# Patient Record
Sex: Female | Born: 1935 | Race: White | Hispanic: No | Smoking: Never smoker
Health system: Southern US, Community
[De-identification: ages and names within clinical notes are randomized; demographics above are authoritative.]

## PROBLEM LIST (undated history)

## (undated) DIAGNOSIS — E538 Deficiency of other specified B group vitamins: Secondary | ICD-10-CM

## (undated) DIAGNOSIS — M545 Low back pain, unspecified: Secondary | ICD-10-CM

## (undated) DIAGNOSIS — N814 Uterovaginal prolapse, unspecified: Secondary | ICD-10-CM

## (undated) DIAGNOSIS — R269 Unspecified abnormalities of gait and mobility: Secondary | ICD-10-CM

## (undated) DIAGNOSIS — E785 Hyperlipidemia, unspecified: Secondary | ICD-10-CM

## (undated) DIAGNOSIS — M1990 Unspecified osteoarthritis, unspecified site: Secondary | ICD-10-CM

## (undated) DIAGNOSIS — I10 Essential (primary) hypertension: Secondary | ICD-10-CM

## (undated) DIAGNOSIS — K8020 Calculus of gallbladder without cholecystitis without obstruction: Secondary | ICD-10-CM

## (undated) DIAGNOSIS — I4891 Unspecified atrial fibrillation: Secondary | ICD-10-CM

## (undated) DIAGNOSIS — L719 Rosacea, unspecified: Secondary | ICD-10-CM

## (undated) DIAGNOSIS — F419 Anxiety disorder, unspecified: Secondary | ICD-10-CM

## (undated) DIAGNOSIS — H409 Unspecified glaucoma: Secondary | ICD-10-CM

## (undated) DIAGNOSIS — E669 Obesity, unspecified: Secondary | ICD-10-CM

## (undated) DIAGNOSIS — S43006A Unspecified dislocation of unspecified shoulder joint, initial encounter: Secondary | ICD-10-CM

## (undated) DIAGNOSIS — R609 Edema, unspecified: Secondary | ICD-10-CM

## (undated) DIAGNOSIS — R0602 Shortness of breath: Secondary | ICD-10-CM

## (undated) DIAGNOSIS — I872 Venous insufficiency (chronic) (peripheral): Secondary | ICD-10-CM

## (undated) DIAGNOSIS — M6740 Ganglion, unspecified site: Secondary | ICD-10-CM

## (undated) DIAGNOSIS — R42 Dizziness and giddiness: Secondary | ICD-10-CM

## (undated) DIAGNOSIS — K760 Fatty (change of) liver, not elsewhere classified: Secondary | ICD-10-CM

## (undated) HISTORY — DX: Low back pain, unspecified: M54.50

## (undated) HISTORY — PX: ABDOMINAL HYSTERECTOMY: SHX81

## (undated) HISTORY — DX: Unspecified osteoarthritis, unspecified site: M19.90

## (undated) HISTORY — DX: Calculus of gallbladder without cholecystitis without obstruction: K80.20

## (undated) HISTORY — DX: Hyperlipidemia, unspecified: E78.5

## (undated) HISTORY — DX: Essential (primary) hypertension: I10

## (undated) HISTORY — PX: TONSILLECTOMY: SUR1361

## (undated) HISTORY — DX: Rosacea, unspecified: L71.9

## (undated) HISTORY — DX: Low back pain: M54.5

## (undated) HISTORY — DX: Fatty (change of) liver, not elsewhere classified: K76.0

---

## 2000-04-10 ENCOUNTER — Encounter: Admission: RE | Admit: 2000-04-10 | Discharge: 2000-04-10 | Payer: Self-pay | Admitting: Internal Medicine

## 2000-04-10 ENCOUNTER — Encounter: Payer: Self-pay | Admitting: Internal Medicine

## 2000-04-14 ENCOUNTER — Ambulatory Visit (HOSPITAL_COMMUNITY): Admission: RE | Admit: 2000-04-14 | Discharge: 2000-04-14 | Payer: Self-pay | Admitting: Internal Medicine

## 2000-04-14 ENCOUNTER — Encounter: Payer: Self-pay | Admitting: Internal Medicine

## 2001-12-03 ENCOUNTER — Encounter: Payer: Self-pay | Admitting: Cardiology

## 2005-02-20 ENCOUNTER — Ambulatory Visit: Payer: Self-pay | Admitting: Internal Medicine

## 2005-03-30 ENCOUNTER — Ambulatory Visit: Payer: Self-pay | Admitting: Internal Medicine

## 2005-04-03 ENCOUNTER — Ambulatory Visit: Payer: Self-pay | Admitting: Internal Medicine

## 2005-10-02 ENCOUNTER — Ambulatory Visit: Payer: Self-pay | Admitting: Internal Medicine

## 2006-01-23 ENCOUNTER — Ambulatory Visit: Payer: Self-pay | Admitting: Internal Medicine

## 2006-01-26 ENCOUNTER — Ambulatory Visit: Payer: Self-pay | Admitting: Internal Medicine

## 2006-05-24 ENCOUNTER — Ambulatory Visit: Payer: Self-pay | Admitting: Internal Medicine

## 2006-09-03 ENCOUNTER — Ambulatory Visit: Payer: Self-pay | Admitting: Internal Medicine

## 2006-09-03 LAB — CONVERTED CEMR LAB
Bilirubin Urine: NEGATIVE
Cholesterol: 220 mg/dL (ref 0–200)
Crystals: NEGATIVE
Glucose, Bld: 108 mg/dL — ABNORMAL HIGH (ref 70–99)
HDL: 41 mg/dL (ref >39.0)
Hemoglobin, Urine: NEGATIVE
Mucus, UA: NEGATIVE
Nitrite: NEGATIVE
Sodium: 140 meq/L (ref 135–145)
Specific Gravity, Urine: 1.02 (ref 1.000–1.03)
TSH: 3.51 microintl units/mL (ref 0.35–5.50)

## 2006-09-06 ENCOUNTER — Ambulatory Visit: Payer: Self-pay | Admitting: Internal Medicine

## 2007-02-25 ENCOUNTER — Ambulatory Visit: Payer: Self-pay | Admitting: Internal Medicine

## 2007-02-25 LAB — CONVERTED CEMR LAB
BUN: 12 mg/dL (ref 6–23)
CO2: 30 meq/L (ref 19–32)
Calcium: 9.1 mg/dL (ref 8.4–10.5)
Direct LDL: 145.4 mg/dL
GFR calc Af Amer: 127 mL/min
GFR calc non Af Amer: 105 mL/min
Hgb A1c MFr Bld: 5.4 % (ref 4.6–6.0)
Triglycerides: 159 mg/dL — ABNORMAL HIGH (ref 0–149)
Vit D, 1,25-Dihydroxy: 17 — ABNORMAL LOW (ref 20–57)

## 2007-02-27 ENCOUNTER — Ambulatory Visit: Payer: Self-pay | Admitting: Internal Medicine

## 2007-02-27 ENCOUNTER — Encounter: Payer: Self-pay | Admitting: Internal Medicine

## 2007-02-27 DIAGNOSIS — M545 Low back pain, unspecified: Secondary | ICD-10-CM | POA: Insufficient documentation

## 2007-02-27 DIAGNOSIS — I10 Essential (primary) hypertension: Secondary | ICD-10-CM | POA: Insufficient documentation

## 2007-02-27 DIAGNOSIS — E559 Vitamin D deficiency, unspecified: Secondary | ICD-10-CM | POA: Insufficient documentation

## 2007-08-22 ENCOUNTER — Ambulatory Visit: Payer: Self-pay | Admitting: Internal Medicine

## 2007-08-22 DIAGNOSIS — R209 Unspecified disturbances of skin sensation: Secondary | ICD-10-CM | POA: Insufficient documentation

## 2007-08-25 LAB — CONVERTED CEMR LAB
Basophils Absolute: 0 10*3/uL (ref 0.0–0.1)
Eosinophils Absolute: 0.2 10*3/uL (ref 0.0–0.6)
HCT: 42.7 % (ref 36.0–46.0)
Hemoglobin: 14.5 g/dL (ref 12.0–15.0)
Lymphocytes Relative: 33.8 % (ref 12.0–46.0)
MCHC: 33.9 g/dL (ref 30.0–36.0)
MCV: 97.5 fL (ref 78.0–100.0)
Monocytes Absolute: 0.5 10*3/uL (ref 0.2–0.7)
Neutro Abs: 2.4 10*3/uL (ref 1.4–7.7)
Neutrophils Relative %: 51.6 % (ref 43.0–77.0)
Triglycerides: 208 mg/dL (ref 0–149)

## 2007-08-29 ENCOUNTER — Ambulatory Visit: Payer: Self-pay | Admitting: Internal Medicine

## 2007-08-29 DIAGNOSIS — E785 Hyperlipidemia, unspecified: Secondary | ICD-10-CM | POA: Insufficient documentation

## 2007-10-01 ENCOUNTER — Encounter: Payer: Self-pay | Admitting: Internal Medicine

## 2008-02-21 ENCOUNTER — Ambulatory Visit: Payer: Self-pay | Admitting: Internal Medicine

## 2008-02-24 LAB — CONVERTED CEMR LAB
ALT: 58 units/L — ABNORMAL HIGH (ref 0–35)
Albumin: 4 g/dL (ref 3.5–5.2)
Bilirubin, Direct: 0.2 mg/dL (ref 0.0–0.3)
Calcium: 9.4 mg/dL (ref 8.4–10.5)
Creatinine, Ser: 0.7 mg/dL (ref 0.4–1.2)
Direct LDL: 139.3 mg/dL
GFR calc Af Amer: 106 mL/min
Glucose, Bld: 109 mg/dL — ABNORMAL HIGH (ref 70–99)
Hemoglobin, Urine: NEGATIVE
Ketones, ur: NEGATIVE mg/dL
RBC / HPF: NONE SEEN
Sodium: 143 meq/L (ref 135–145)
Specific Gravity, Urine: 1.01 (ref 1.000–1.03)
Total CHOL/HDL Ratio: 6.2
Total Protein: 6.9 g/dL (ref 6.0–8.3)
Urobilinogen, UA: 0.2 (ref 0.0–1.0)
VLDL: 35 mg/dL (ref 0–40)

## 2008-02-27 ENCOUNTER — Ambulatory Visit: Payer: Self-pay | Admitting: Internal Medicine

## 2008-02-27 DIAGNOSIS — R5383 Other fatigue: Secondary | ICD-10-CM | POA: Insufficient documentation

## 2008-02-27 DIAGNOSIS — R945 Abnormal results of liver function studies: Secondary | ICD-10-CM | POA: Insufficient documentation

## 2008-02-27 DIAGNOSIS — N3090 Cystitis, unspecified without hematuria: Secondary | ICD-10-CM | POA: Insufficient documentation

## 2008-03-09 ENCOUNTER — Encounter: Admission: RE | Admit: 2008-03-09 | Discharge: 2008-03-09 | Payer: Self-pay | Admitting: Internal Medicine

## 2008-06-25 ENCOUNTER — Ambulatory Visit: Payer: Self-pay | Admitting: Internal Medicine

## 2008-06-25 LAB — CONVERTED CEMR LAB
Albumin: 3.8 g/dL (ref 3.5–5.2)
Alkaline Phosphatase: 56 units/L (ref 39–117)
Bilirubin, Direct: 0.1 mg/dL (ref 0.0–0.3)
Calcium: 9.5 mg/dL (ref 8.4–10.5)
Creatinine,U: 141.2 mg/dL
GFR calc Af Amer: 126 mL/min
GFR calc non Af Amer: 104 mL/min
Glucose, Bld: 118 mg/dL — ABNORMAL HIGH (ref 70–99)
Potassium: 4.2 meq/L (ref 3.5–5.1)
Sodium: 142 meq/L (ref 135–145)
Total Bilirubin: 0.8 mg/dL (ref 0.3–1.2)

## 2008-07-01 ENCOUNTER — Ambulatory Visit: Payer: Self-pay | Admitting: Internal Medicine

## 2008-07-01 DIAGNOSIS — K8010 Calculus of gallbladder with chronic cholecystitis without obstruction: Secondary | ICD-10-CM | POA: Insufficient documentation

## 2008-07-21 ENCOUNTER — Encounter: Admission: RE | Admit: 2008-07-21 | Discharge: 2008-07-21 | Payer: Self-pay | Admitting: Internal Medicine

## 2008-12-18 ENCOUNTER — Ambulatory Visit: Payer: Self-pay | Admitting: Internal Medicine

## 2008-12-18 LAB — CONVERTED CEMR LAB
ALT: 68 units/L — ABNORMAL HIGH (ref 0–35)
BUN: 13 mg/dL (ref 6–23)
Bilirubin, Direct: 0.1 mg/dL (ref 0.0–0.3)
Calcium: 8.9 mg/dL (ref 8.4–10.5)
GFR calc non Af Amer: 104.01 mL/min (ref >60)
Glucose, Bld: 107 mg/dL — ABNORMAL HIGH (ref 70–99)
Total Bilirubin: 0.8 mg/dL (ref 0.3–1.2)
VLDL: 20 mg/dL (ref 0.0–40.0)

## 2008-12-22 ENCOUNTER — Ambulatory Visit: Payer: Self-pay | Admitting: Internal Medicine

## 2008-12-22 DIAGNOSIS — K7689 Other specified diseases of liver: Secondary | ICD-10-CM | POA: Insufficient documentation

## 2008-12-22 DIAGNOSIS — M1990 Unspecified osteoarthritis, unspecified site: Secondary | ICD-10-CM | POA: Insufficient documentation

## 2008-12-24 LAB — CONVERTED CEMR LAB
Bilirubin Urine: NEGATIVE
Nitrite: NEGATIVE
Total Protein, Urine: NEGATIVE mg/dL
pH: 7.5 (ref 5.0–8.0)

## 2009-05-21 ENCOUNTER — Ambulatory Visit: Payer: Self-pay | Admitting: Internal Medicine

## 2009-05-21 LAB — CONVERTED CEMR LAB
ALT: 63 units/L — ABNORMAL HIGH (ref 0–35)
AST: 45 units/L — ABNORMAL HIGH (ref 0–37)
Albumin: 3.7 g/dL (ref 3.5–5.2)
Alkaline Phosphatase: 66 units/L (ref 39–117)
BUN: 9 mg/dL (ref 6–23)
Bilirubin, Direct: 0.2 mg/dL (ref 0.0–0.3)
CO2: 31 meq/L (ref 19–32)
Calcium: 9.1 mg/dL (ref 8.4–10.5)
Chloride: 95 meq/L — ABNORMAL LOW (ref 96–112)
Creatinine, Ser: 0.6 mg/dL (ref 0.4–1.2)
GFR calc non Af Amer: 103.89 mL/min (ref >60)
Glucose, Bld: 133 mg/dL — ABNORMAL HIGH (ref 70–99)
Potassium: 3.5 meq/L (ref 3.5–5.1)
Sodium: 134 meq/L — ABNORMAL LOW (ref 135–145)
TSH: 1.38 microintl units/mL (ref 0.35–5.50)
Total Bilirubin: 0.7 mg/dL (ref 0.3–1.2)
Total Protein: 6.8 g/dL (ref 6.0–8.3)

## 2009-06-07 ENCOUNTER — Ambulatory Visit: Payer: Self-pay | Admitting: Internal Medicine

## 2009-07-22 ENCOUNTER — Encounter: Admission: RE | Admit: 2009-07-22 | Discharge: 2009-07-22 | Payer: Self-pay | Admitting: Internal Medicine

## 2009-09-01 ENCOUNTER — Ambulatory Visit: Payer: Self-pay | Admitting: Internal Medicine

## 2009-09-03 LAB — CONVERTED CEMR LAB
ALT: 64 units/L — ABNORMAL HIGH (ref 0–35)
Alkaline Phosphatase: 63 units/L (ref 39–117)
BUN: 10 mg/dL (ref 6–23)
Bilirubin, Direct: 0.1 mg/dL (ref 0.0–0.3)
Cholesterol: 182 mg/dL (ref 0–200)
Creatinine, Ser: 0.6 mg/dL (ref 0.4–1.2)
GFR calc non Af Amer: 103.81 mL/min (ref >60)
Hemoglobin, Urine: NEGATIVE
Ketones, ur: NEGATIVE mg/dL
Total Protein: 7 g/dL (ref 6.0–8.3)
Urine Glucose: NEGATIVE mg/dL
Urobilinogen, UA: 0.2 (ref 0.0–1.0)

## 2009-09-07 ENCOUNTER — Ambulatory Visit: Payer: Self-pay | Admitting: Internal Medicine

## 2009-09-07 DIAGNOSIS — R21 Rash and other nonspecific skin eruption: Secondary | ICD-10-CM | POA: Insufficient documentation

## 2010-03-10 ENCOUNTER — Ambulatory Visit: Payer: Self-pay | Admitting: Internal Medicine

## 2010-03-10 LAB — CONVERTED CEMR LAB
ALT: 51 units/L — ABNORMAL HIGH (ref 0–35)
AST: 37 units/L (ref 0–37)
Albumin: 3.9 g/dL (ref 3.5–5.2)
Basophils Absolute: 0 10*3/uL (ref 0.0–0.1)
Basophils Relative: 0.8 % (ref 0.0–3.0)
CO2: 31 meq/L (ref 19–32)
Eosinophils Relative: 4.1 % (ref 0.0–5.0)
GFR calc non Af Amer: 109.98 mL/min (ref >60)
Glucose, Bld: 93 mg/dL (ref 70–99)
HCT: 41.7 % (ref 36.0–46.0)
Hemoglobin: 14.7 g/dL (ref 12.0–15.0)
Lymphs Abs: 1.5 10*3/uL (ref 0.7–4.0)
Monocytes Relative: 10.2 % (ref 3.0–12.0)
Neutro Abs: 2.2 10*3/uL (ref 1.4–7.7)
Nitrite: NEGATIVE
Potassium: 3.8 meq/L (ref 3.5–5.1)
RBC: 4.23 M/uL (ref 3.87–5.11)
RDW: 11.9 % (ref 11.5–14.6)
Sodium: 135 meq/L (ref 135–145)
Specific Gravity, Urine: 1.01 (ref 1.000–1.030)
TSH: 1.64 microintl units/mL (ref 0.35–5.50)
Total CHOL/HDL Ratio: 4
Total Protein, Urine: NEGATIVE mg/dL
Total Protein: 6.5 g/dL (ref 6.0–8.3)
pH: 8 (ref 5.0–8.0)

## 2010-03-15 ENCOUNTER — Ambulatory Visit: Payer: Self-pay | Admitting: Internal Medicine

## 2010-03-17 ENCOUNTER — Ambulatory Visit: Payer: Self-pay | Admitting: Internal Medicine

## 2010-03-17 ENCOUNTER — Encounter: Payer: Self-pay | Admitting: Internal Medicine

## 2010-04-05 ENCOUNTER — Telehealth: Payer: Self-pay | Admitting: Internal Medicine

## 2010-06-05 DIAGNOSIS — S43006A Unspecified dislocation of unspecified shoulder joint, initial encounter: Secondary | ICD-10-CM

## 2010-06-05 HISTORY — DX: Unspecified dislocation of unspecified shoulder joint, initial encounter: S43.006A

## 2010-06-27 ENCOUNTER — Other Ambulatory Visit: Payer: Self-pay | Admitting: Internal Medicine

## 2010-06-27 DIAGNOSIS — Z1239 Encounter for other screening for malignant neoplasm of breast: Secondary | ICD-10-CM

## 2010-07-05 NOTE — Miscellaneous (Signed)
Summary: BONE DENSITY  Clinical Lists Changes  Orders: Added new Test order of T-Lumbar Vertebral Assessment (77082) - Signed 

## 2010-07-05 NOTE — Assessment & Plan Note (Signed)
Summary: 6 mos f/u, pt does not want 6 mos well/cd   Vital Signs:  Patient profile:   77 year old female Height:      61 inches Weight:      186 pounds BMI:     35.27 Temp:     98.0 degrees F oral Pulse rate:   68 / minute Pulse rhythm:   regular Resp:     16 per minute BP sitting:   128 / 72  (left arm) Cuff size:   regular  Vitals Entered By: Jonathon Resides, CMA(AAMA) (March 15, 2010 7:58 AM) CC: 6 mo f/u Is Patient Diabetic? No   CC:  6 mo f/u.  History of Present Illness: The patient presents for a follow up of hypertension, dyslipidemia C/o fatigue at times, tingling The patient presents for a preventive health examination  Patient past medical history, social history, and family history reviewed in detail no significant changes.  Patient is physically active. Depression is negative and mood is good. Hearing is normal, and able to perform activities of daily living. Risk of falling is negligible and home safety has been reviewed and is appropriate. Patient has normal height, overweight, and has nl visual acuity w/glasses. Patient has been counseled on age-appropriate routine health concerns for screening and prevention. Education, counseling done.    Preventive Screening-Counseling & Management  Alcohol-Tobacco     Alcohol drinks/day: <1     Tobacco Counseling: not indicated; no tobacco use  Caffeine-Diet-Exercise     Caffeine Counseling: not indicated; caffeine use is not excessive or problematic     Diet Counseling: to improve diet; diet is suboptimal     Does Patient Exercise: no     Exercise Counseling: to improve exercise regimen     Depression Counseling: not indicated; screening negative for depression  Hep-HIV-STD-Contraception     Hepatitis Risk: no risk noted     Sun Exposure-Excessive: no  Safety-Violence-Falls     Seat Belt Use: yes     Fall Risk Counseling: not indicated; no significant falls noted  Current Medications (verified): 1)  Micardis  80 Mg  Tabs (Telmisartan) .[name].. 1 Po Qd 2)  Maxzide-25 37.5-25 Mg  Tabs (Triamterene-Hctz) .[name].. 1 Po Qd 3)  Vitamin D3 1000 Unit  Tabs (Cholecalciferol) .[name].. 1 By Mouth Daily 4)  Fish Oil   Oil (Fish Oil) .[name].. 1 By Mouth Bid 5)  Aspirin 81 Mg Tbec (Aspirin) .[name].. 1 By Mouth Qd 6)  Triamcinolone Acetonide 0.5 % Crea (Triamcinolone Acetonide) .... Use Two Times A Day Prn  Allergies (verified): No Known Drug Allergies  Past History:  Past Medical History: Last updated: 09/07/2009 Hypertension Low back pain Hyperlipidemia Fatty liver A single GS 1.5 cm Osteoarthritis Rosacea  Family History: Last updated: 08/29/2007 M  pancr ca  Social History: Last updated: 06/07/2009 Retired Single Never Smoked Not taking vaccines  Past Surgical History: Tonsillectomy  Social History: Does Patient Exercise:  no Hepatitis Risk:  no risk noted Sun Exposure-Excessive:  no Seat Belt Use:  yes   Contraindications/Deferment of Procedures/Staging:    Test/Procedure: Colonoscopy    Reason for deferment: patient declined     Test/Procedure: FLU VAX    Reason for deferment: patient declined     Test/Procedure: Pneumovax vaccine    Reason for deferment: patient declined     Test/Procedure: TD vaccine    Reason for deferment: declined   Impression & Recommendations:  Problem # 1:  HYPERTENSION (ICD-401.9) Assessment Improved  Her updated medication list  for this problem includes:    Micardis 80 Mg Tabs (Telmisartan) .[name]... 1 po qd    Maxzide-25 37.5-25 Mg Tabs (Triamterene-hctz) .[name]... 1 po qd  Problem # 2:  HYPERLIPIDEMIA (B2193296.4) Assessment: Unchanged  Declined statins  Labs Reviewed: SGOT: 37 (03/10/2010)   SGPT: 51 (03/10/2010)   HDL:46.70 (03/10/2010), 46.80 (09/01/2009)  LDL:121 (03/10/2010), 106 (09/01/2009)  Chol:192 (03/10/2010), 182 (09/01/2009)  Trig:124.0 (03/10/2010), 145.0 (09/01/2009)  Problem # 3:  FATIGUE (ICD-780.79) Try  Tylenol or Advil at hs to help  w/pain and overactive bladder. Possible OSA. Declined test for OSA  Problem # 4:  FATTY LIVER DISEASE (ICD-571.8) Assessment: Unchanged  The labs were reviewed with the patient.   Orders: EMR Buyer, retail Code [facility])  Problem # 5:  WELL ADULT EXAM (ICD-V70.0) Assessment: New  Declined all shots and a colonoscopy BDS  Mammo q 12 months   Overall doing well, age appropriate education and counseling updated and referral for appropriate preventive services done unless declined, immunizations up to date or declined, diet counseling done if overweight, urged to quit smoking if smokes, most recent labs reviewed and current ordered if appropriate, ecg reviewed or declined (interpretation per ECG scanned in the EMR if done); information regarding Medicare Preventation requirements given if appropriate.   Orders: Medicare -1st Annual Wellness Visit [phone]) EMR Misc Charge Code [facility])  Complete Medication List: 1)  Micardis 80 Mg Tabs (Telmisartan) .[name].. 1 po qd 2)  Maxzide-25 37.5-25 Mg Tabs (Triamterene-hctz) .[name].. 1 po qd 3)  Vitamin D3 1000 Unit Tabs (Cholecalciferol) .[name].. 1 by mouth daily 4)  Fish Oil Oil (Fish oil) .[name].. 1 by mouth bid 5)  Aspirin 81 Mg Tbec (Aspirin) .[name].. 1 by mouth qd 6)  Triamcinolone Acetonide 0.5 % Crea (Triamcinolone acetonide) .... Use two times a day prn  Other Orders: T-Bone Densitometry [id number])  Patient Instructions: 1)  Pls sch BDS Dx 733.90 2)  Please schedule a follow-up appointment in 6 months. 3)  BMP prior to visit, ICD-9: 4)  Hepatic Panel prior to visit, ICD-9: 5)  CBC w/ Diff prior to visit, ICD-9: 6)  Vit B12  780.79  790.5 272.0 782.0   Not Administered:    Influenza Vaccine not given due to: declined

## 2010-07-05 NOTE — Assessment & Plan Note (Signed)
Summary: 4 MO ROV /NWS $50  rs'd/cd   Vital Signs:  Patient profile:   77 year old female Weight:      187 pounds Temp:     98.1 degrees F oral Pulse rate:   84 / minute BP sitting:   142 / 76  (left arm)  Vitals Entered By: Doralee Albino (June 07, 2009 8:30 AM) CC: f/u Is Patient Diabetic? No   CC:  f/u.  History of Present Illness: The patient presents for a follow up of hypertension, elev LFTs, hyperlipidemia.  Preventive Screening-Counseling & Management  Alcohol-Tobacco     Smoking Status: never  Allergies (verified): No Known Drug Allergies  Past History:  Past Medical History: Last updated: 12/22/2008 Hypertension Low back pain Hyperlipidemia Fatty liver A single GS 1.5 cm Osteoarthritis  Social History: Last updated: 06/07/2009 Retired Single Never Smoked Not taking vaccines  Social History: Retired Single Never Smoked Not taking vaccines  Review of Systems  The patient denies fever, weight gain, dyspnea on exertion, and abdominal pain.    Physical Exam  General:  overweight-appearing.   Nose:  External nasal examination shows no deformity or inflammation. Nasal mucosa are pink and moist without lesions or exudates. Mouth:  Oral mucosa and oropharynx without lesions or exudates.  Teeth in good repair. Lungs:  Normal respiratory effort, chest expands symmetrically. Lungs are clear to auscultation, no crackles or wheezes. Heart:  Normal rate and regular rhythm. S1 and S2 normal without gallop, murmur, click, rub or other extra sounds. Abdomen:  Bowel sounds positive,abdomen soft and non-tender without masses, organomegaly or hernias noted. Msk:  No deformity or scoliosis noted of thoracic or lumbar spine.   Extremities:  No clubbing, cyanosis, edema, or deformity noted with normal full range of motion of all joints.   Neurologic:  No cranial nerve deficits noted. Station and gait are normal. Plantar reflexes are down-going bilaterally. DTRs  are symmetrical throughout. Sensory, motor and coordinative functions appear intact. Skin:  Intact without suspicious lesions or rashes Psych:  Cognition and judgment appear intact. Alert and cooperative with normal attention span and concentration. No apparent delusions, illusions, hallucinations   Impression & Recommendations:  Problem # 1:  OSTEOARTHRITIS (ICD-715.90) Assessment Unchanged  Start taking a chair yoga class   Her updated medication list for this problem includes:    Aspirin 81 Mg Tbec (Aspirin) .[name]... 1 by mouth qd  Problem # 2:  GALLSTONES (ICD-574.20) Assessment: Unchanged  Problem # 3:  HYPERTENSION (ICD-401.9) Assessment: Comment Only  Her updated medication list for this problem includes:    Micardis 80 Mg Tabs (Telmisartan) .[name]... 1 po once daily does not want to switch    Maxzide-25 37.5-25 Mg Tabs (Triamterene-hctz) .[name]... 1 po qd The labs were reviewed with the patient.   Problem # 4:  DEFICIENCY, VITAMIN D NOS (ICD-268.9) On prescription therapy   Complete Medication List: 1)  Micardis 80 Mg Tabs (Telmisartan) .[name].. 1 po qd 2)  Maxzide-25 37.5-25 Mg Tabs (Triamterene-hctz) .[name].. 1 po qd 3)  Vitamin D3 1000 Unit Tabs (Cholecalciferol) .[name].. 1 by mouth daily 4)  Fish Oil Oil (Fish oil) .[name].. 1 by mouth bid 5)  Aspirin 81 Mg Tbec (Aspirin) .[name].. 1 by mouth qd  Patient Instructions: 1)  Try to eat more raw plant food, fresh and dry fruit, raw almonds, leafy vegetables, whole foods and less red meat, less animal fat. Poultry and fish is better for you than pork and beef. Avoid processed foods (canned soups,  hot dogs, sausage, bacon , frozen dinners). Avoid corn syrup, high fructose syrup or aspartam and Splenda  containing drinks. Honey, Agave and Stevia are better sweeteners. Make your own  dressing with olive oil, wine vinegar, lemon juce, garlic etc. for your salads. 2)  Start taking a chair yoga class 3)  Please schedule a follow-up appointment in 3  months. 4)  BMP prior to visit, ICD-9:401.1  99520 5)  Hepatic Panel prior to visit, ICD-9: 6)  Lipid Panel prior to visit, ICD-9: 7)  Urine-dip prior to visit, ICD-9:  Prescriptions: MAXZIDE-25 37.5-25 MG  TABS (TRIAMTERENE-HCTZ) 1 po qd  #30 x 12   Entered and Authorized by:   Cassandria Anger MD   Signed by:   Cassandria Anger MD on 06/07/2009   Method used:   Electronically to        [facility] [phone]* (retail)       [place], [place]  [address]       Ph: [phone] or [phone]       Fax: [phone]   RxID:   [id number] MICARDIS 80 MG  TABS (TELMISARTAN) 1 po qd  #30 x 12   Entered and Authorized by:   Cassandria Anger MD   Signed by:   Cassandria Anger MD on 06/07/2009   Method used:   Electronically to        [facility]  [address] [phone]* (retail)       [address]       [place], [place]  [address]       Ph: [phone] or [phone]       Fax: [phone]   RxID:   [id number]

## 2010-07-05 NOTE — Progress Notes (Signed)
Summary: Bone denisty results  Phone Note Call from Patient Call back at Home Phone [phone]   Caller: Patient Reason for Call: Talk to Nurse Summary of Call: Troy Initial call taken by: Darnell Level,  April 05, 2010 1:48 PM  Follow-up for Phone Call        pt informed of bone denisty results Follow-up by: Jonathon Resides, [facility]),  April 05, 2010 4:34 PM

## 2010-07-05 NOTE — Assessment & Plan Note (Signed)
Summary: 3 MO ROV /NWS  #   Vital Signs:  Patient profile:   77 year old female Weight:      188 pounds BMI:     35.65 Temp:     97.0 degrees F Pulse rate:   90 / minute BP sitting:   118 / 60  Vitals Entered By: Charlsie Quest, CMA (September 07, 2009 7:59 AM) CC: 3 mth f/u / SD   CC:  3 mth f/u / SD.  History of Present Illness: The patient presents for a follow up of hypertension, hyperlipidemia C/o rash on L hand x 3 wks  Allergies: No Known Drug Allergies  Past History:  Social History: Last updated: 06/07/2009 Retired Single Never Smoked Not taking vaccines  Past Medical History: Hypertension Low back pain Hyperlipidemia Fatty liver A single GS 1.5 cm Osteoarthritis Rosacea  Review of Systems  The patient denies fever, dyspnea on exertion, prolonged cough, and abdominal pain.         No LBP  Physical Exam  General:  overweight-appearing.   Nose:  External nasal examination shows no deformity or inflammation. Nasal mucosa are pink and moist without lesions or exudates. Mouth:  Oral mucosa and oropharynx without lesions or exudates.  Teeth in good repair. Lungs:  Normal respiratory effort, chest expands symmetrically. Lungs are clear to auscultation, no crackles or wheezes. Heart:  Normal rate and regular rhythm. S1 and S2 normal without gallop, murmur, click, rub or other extra sounds. Abdomen:  Bowel sounds positive,abdomen soft and non-tender without masses, organomegaly or hernias noted. Extremities:  No clubbing, cyanosis, edema, or deformity noted with normal full range of motion of all joints.   Neurologic:  No cranial nerve deficits noted. Station and gait are normal. Plantar reflexes are down-going bilaterally. DTRs are symmetrical throughout. Sensory, motor and coordinative functions appear intact. Skin:  A patch 2 cm dry skin rash on R hand Rosacea - mild Psych:  Cognition and judgment appear intact. Alert and cooperative with normal attention  span and concentration. No apparent delusions, illusions, hallucinations   Impression & Recommendations:  Problem # 1:  HYPERTENSION (ICD-401.9) Assessment Improved  Her updated medication list for this problem includes:    Micardis 80 Mg Tabs (Telmisartan) .[name]... 1 po qd    Maxzide-25 37.5-25 Mg Tabs (Triamterene-hctz) .[name]... 1 po qd The labs were reviewed with the patient.   Problem # 2:  LOW BACK PAIN (ICD-724.2) Assessment: Improved  Her updated medication list for this problem includes:    Aspirin 81 Mg Tbec (Aspirin) .[name]... 1 by mouth qd  Problem # 3:  HYPERLIPIDEMIA (B2193296.4)  Problem # 4:  SKIN RASH (ICD-782.1) eczema  Her updated medication list for this problem includes:    Triamcinolone Acetonide 0.5 % Crea (Triamcinolone acetonide) ..... Use two times a day prn  Orders: Prescription Created Electronically [phone])  Complete Medication List: 1)  Micardis 80 Mg Tabs (Telmisartan) .[name].. 1 po qd 2)  Maxzide-25 37.5-25 Mg Tabs (Triamterene-hctz) .[name].. 1 po qd 3)  Vitamin D3 1000 Unit Tabs (Cholecalciferol) .[name].. 1 by mouth daily 4)  Fish Oil Oil (Fish oil) .[name].. 1 by mouth bid 5)  Aspirin 81 Mg Tbec (Aspirin) .[name].. 1 by mouth qd 6)  Triamcinolone Acetonide 0.5 % Crea (Triamcinolone acetonide) .... Use two times a day prn  Patient Instructions: 1)  Please schedule a follow-up appointment in 6 months well w/labs. 2)  Use stretching exercises  Contraindications/Deferment of Procedures/Staging:    Test/Procedure: Pneumovax vaccine  Reason for deferment: patient declined     Test/Procedure: FLU VAX    Reason for deferment: patient declined     Test/Procedure: TD vaccine    Reason for deferment: declined  Prescriptions: TRIAMCINOLONE ACETONIDE 0.5 % CREA (TRIAMCINOLONE ACETONIDE) use two times a day prn  #120 g x 3   Entered and Authorized by:   Cassandria Anger MD   Signed by:   Cassandria Anger MD on 09/07/2009   Method used:   Electronically to        [facility]. [phone]* (retail)       [address]       [place], [place]  [address]       Ph: [phone] or [phone]       Fax: [phone]   RxID:   [id number]

## 2010-07-25 ENCOUNTER — Ambulatory Visit
Admission: RE | Admit: 2010-07-25 | Discharge: 2010-07-25 | Disposition: A | Discharge status: Home / Self Care | Payer: Medicare Other | Source: Ambulatory Visit | Attending: Internal Medicine | Admitting: Internal Medicine

## 2010-07-25 DIAGNOSIS — Z1239 Encounter for other screening for malignant neoplasm of breast: Secondary | ICD-10-CM

## 2010-08-10 ENCOUNTER — Encounter: Payer: Self-pay | Admitting: Internal Medicine

## 2010-08-16 NOTE — Miscellaneous (Signed)
Summary: mammogram 2012  Clinical Lists Changes  Observations: Added new observation of MAMMOGRAM: normal (07/15/2010 9:13)      Preventive Care Screening  Mammogram:    Date:  07/15/2010    Results:  normal

## 2010-09-06 ENCOUNTER — Other Ambulatory Visit: Payer: Self-pay

## 2010-09-13 ENCOUNTER — Other Ambulatory Visit (INDEPENDENT_AMBULATORY_CARE_PROVIDER_SITE_OTHER): Payer: Medicare Other

## 2010-09-13 ENCOUNTER — Ambulatory Visit: Payer: Self-pay | Admitting: Internal Medicine

## 2010-09-13 ENCOUNTER — Other Ambulatory Visit: Payer: Self-pay

## 2010-09-13 DIAGNOSIS — R5383 Other fatigue: Secondary | ICD-10-CM

## 2010-09-13 DIAGNOSIS — E7800 Pure hypercholesterolemia, unspecified: Secondary | ICD-10-CM

## 2010-09-13 DIAGNOSIS — R209 Unspecified disturbances of skin sensation: Secondary | ICD-10-CM

## 2010-09-13 DIAGNOSIS — R748 Abnormal levels of other serum enzymes: Secondary | ICD-10-CM

## 2010-09-13 DIAGNOSIS — R5381 Other malaise: Secondary | ICD-10-CM

## 2010-09-13 LAB — BASIC METABOLIC PANEL
BUN: 9 mg/dL (ref 6–23)
CO2: 32 mEq/L (ref 19–32)
Calcium: 9.5 mg/dL (ref 8.4–10.5)
Creatinine, Ser: 0.7 mg/dL (ref 0.4–1.2)
GFR: 89.6 mL/min (ref >60.00)
Glucose, Bld: 97 mg/dL (ref 70–99)

## 2010-09-13 LAB — HEPATIC FUNCTION PANEL
Albumin: 3.8 g/dL (ref 3.5–5.2)
Total Protein: 6.5 g/dL (ref 6.0–8.3)

## 2010-09-13 LAB — CBC WITH DIFFERENTIAL/PLATELET
Basophils Absolute: 0 10*3/uL (ref 0.0–0.1)
Eosinophils Relative: 3.7 % (ref 0.0–5.0)
HCT: 41.8 % (ref 36.0–46.0)
Hemoglobin: 14.6 g/dL (ref 12.0–15.0)
Lymphocytes Relative: 34.9 % (ref 12.0–46.0)
Lymphs Abs: 1.6 10*3/uL (ref 0.7–4.0)
Monocytes Relative: 9.2 % (ref 3.0–12.0)
Neutro Abs: 2.4 10*3/uL (ref 1.4–7.7)
RDW: 11.9 % (ref 11.5–14.6)
WBC: 4.7 10*3/uL (ref 4.5–10.5)

## 2010-09-13 LAB — VITAMIN B12: Vitamin B-12: 675 pg/mL (ref 211–911)

## 2010-09-21 ENCOUNTER — Encounter: Payer: Self-pay | Admitting: Internal Medicine

## 2010-09-21 ENCOUNTER — Ambulatory Visit (INDEPENDENT_AMBULATORY_CARE_PROVIDER_SITE_OTHER): Payer: Medicare Other | Admitting: Internal Medicine

## 2010-09-21 DIAGNOSIS — I872 Venous insufficiency (chronic) (peripheral): Secondary | ICD-10-CM

## 2010-09-21 DIAGNOSIS — R609 Edema, unspecified: Secondary | ICD-10-CM

## 2010-09-21 DIAGNOSIS — R945 Abnormal results of liver function studies: Secondary | ICD-10-CM

## 2010-09-21 NOTE — Assessment & Plan Note (Signed)
Worse. Discussed wt loss and compr socks

## 2010-09-21 NOTE — Patient Instructions (Signed)
Venous Stasis & Chronic Venous Insufficiency As people age, the veins located in their legs may weaken and stretch. When veins weaken and lose the ability to pump blood effectively, the condition is called chronic venous insufficiency (CVI) or venous stasis.  Almost all veins return blood back to the heart. This happens by:  The force of the heart pumping fresh blood pushes blood back to the heart.   Blood flowing to the heart from the force of gravity.  In the deep veins of the legs, blood has to fight gravity and flow upstream back to the heart. Here, the leg muscles contract to pump blood back toward the heart.  Vein walls are elastic, and many veins have small valves that only allow blood to flow in one direction. When leg muscles contract, they push inward against the elastic vein walls. This squeezes blood upward, opens the valves, and moves blood toward the heart. When leg muscles relax, the vein wall also relaxes and the valves inside the vein close to prevent blood from flowing backward. This method of pumping blood out of the legs is called the venous pump. CAUSES The venous pump works best while walking and leg muscles are contracting. But when a person sits or stands, blood pressure in leg veins can build. Deep veins are usually able to withstand short periods of inactivity, but long periods of inactivity (and increased pressure) can stretch, weaken, and damage vein walls.  High blood pressure can also stretch and damage vein walls. The veins may no longer be able to pump blood back to the heart. Venous hypertension (high blood pressure inside veins) that lasts over time is a primary cause of CVI. CVI can also be caused by:   Deep vein thrombosis, a condition where a thrombus (blood clot) blocks blood flow in a vein.   Phlebitis, an inflammation of a superficial vein that causes a blood clot to form.  Other risk factors for CVI may include:   Heredity.   Obesity.   Pregnancy.    Sedentary lifestyle.   Smoking.   Jobs requiring long periods of standing or sitting in one place.   Age and gender:   Women in their 44's and 50's and men in their 62's are more prone to developing CVI.  SYMPTOMS Symptoms of CVI may include:   Varicose veins.   Ulceration or skin breakdown.   Lipodermatosclerosis, a condition that affects the skin just above the ankle, usually on the inside surface. Over time the skin becomes brown, smooth, tight and often painful. Those with this condition have a high risk of developing skin ulcers.   Reddened or discolored skin on the leg.   Swelling.  DIAGNOSIS Your caregiver can diagnose CVI after performing a careful medical history and physical examination. To confirm the diagnosis, the following tests may also be ordered:   Duplex ultrasound.   Plethysmography (tests blood flow).   Venograms (x-ray using a special dye).  TREATMENT The goals of treatment for CVI are to restore a person to an active life and to minimize pain or disability. Typically, CVI does not pose a serious threat to life or limb, and with proper treatment most people with this condition can continue to lead active lives. In most cases, mild CVI can be treated on an outpatient basis with simple procedures. Treatment methods include:   Elastic compression socks.   Sclerotherapy, a procedure involving an injection of a material that "dissolves" the damaged veins. Other veins in the network  of blood vessels take over the function of the damaged veins.   Vein stripping (an older procedure less commonly used).   Laser Ablation surgery.   Valve repair.  HOME CARE INSTRUCTIONS  Elastic compression socks must be worn every day. They can help with symptoms and lower the chances of the problem getting worse, but they do not cure the problem.   Only take over-the-counter or prescription medicines for pain, discomfort, or fever as directed by your caregiver.   Your  caregiver will review your other medications with you.  SEEK MEDICAL CARE IF:  You are confused about how to take your medications.   There is redness, swelling, or increasing pain in the affected area.   There is a red streak or line that extends up or down from the affected area.   There is a breakdown or loss of skin in the affected area, even if the breakdown is small.     There is an injury to the affected area.  SEEK IMMEDIATE MEDICAL CARE IF:  There is an injury and open wound to the affected area.   Pain is not adequately relieved with pain medication prescribed or becomes severe.     The foot/ankle below the affected area becomes suddenly numb or the area feels weak and hard to move.  MAKE SURE YOU:   Understand these instructions.   Will watch your condition.   Will get help right away if you are not doing well or get worse.  Document Released: 09/25/2006 Document Re-Released: 05/04/2008 [facility] Patient Information 2011 [place].

## 2010-09-21 NOTE — Assessment & Plan Note (Signed)
Better  

## 2010-09-21 NOTE — Progress Notes (Signed)
  Subjective:    Patient ID: Kathleen Mejia, female    DOB: 03-Feb-1934, 77 y.o.   MRN: [id number]  HPI  The patient presents for a follow-up of  chronic hypertension, chronic dyslipidemia, leg swelling controlled with medicines most of the time    Review of Systems  Constitutional: Negative for activity change and unexpected weight change.  HENT: Negative for postnasal drip.   Eyes: Negative for pain.  Respiratory: Negative for stridor.   Musculoskeletal: Negative for back pain and joint swelling.  Psychiatric/Behavioral: Negative for behavioral problems and dysphoric mood.       Wt Readings from Last 3 Encounters:  09/21/10 184 lb (83.462 kg)  03/15/10 186 lb (84.369 kg)  09/07/09 188 lb (85.276 kg)    Objective:   Physical Exam  Constitutional: She appears well-developed and well-nourished. No distress.       obese  HENT:  Head: Normocephalic.  Right Ear: External ear normal.  Left Ear: External ear normal.  Nose: Nose normal.  Mouth/Throat: Oropharynx is clear and moist.  Eyes: Conjunctivae are normal. Pupils are equal, round, and reactive to light. Right eye exhibits no discharge. Left eye exhibits no discharge.  Neck: Normal range of motion. Neck supple. No JVD present. No tracheal deviation present. No thyromegaly present.  Cardiovascular: Normal rate, regular rhythm and normal heart sounds.   Pulmonary/Chest: No stridor. No respiratory distress. She has no wheezes.  Abdominal: Soft. Bowel sounds are normal. She exhibits no distension and no mass. There is no tenderness. There is no rebound and no guarding.  Musculoskeletal: She exhibits edema (1+ B ankles). She exhibits no tenderness.  Lymphadenopathy:    She has no cervical adenopathy.  Neurological: She displays normal reflexes. No cranial nerve deficit. She exhibits normal muscle tone. Coordination normal.  Skin: No rash noted. No erythema.  Psychiatric: She has a normal mood and affect. Her behavior is normal.  Judgment and thought content normal.        Lab Results  Component Value Date   WBC 4.7 09/13/2010   HGB 14.6 09/13/2010   HCT 41.8 09/13/2010   PLT 204.0 09/13/2010   CHOL 192 03/10/2010   TRIG 124.0 03/10/2010   HDL 46.70 03/10/2010   LDLDIRECT 139.3 02/21/2008   ALT 32 09/13/2010   AST 28 09/13/2010   NA 138 09/13/2010   K 4.0 09/13/2010   CL 99 09/13/2010   CREATININE 0.7 09/13/2010   BUN 9 09/13/2010   CO2 32 09/13/2010   TSH 1.64 03/10/2010   HGBA1C 5.5 12/18/2008   MICROALBUR 0.4 06/25/2008     Assessment & Plan:  Edema Worse lately. Loose wt. Compr socks Venous insufficiency of leg Worse. Discussed wt loss and compr socks  ABNORMAL LIVER FUNCTION TESTS Better  HTN Cont with Rx

## 2010-09-21 NOTE — Assessment & Plan Note (Signed)
Worse lately. Loose wt.

## 2010-12-22 ENCOUNTER — Ambulatory Visit (INDEPENDENT_AMBULATORY_CARE_PROVIDER_SITE_OTHER): Payer: Medicare Other | Admitting: Internal Medicine

## 2010-12-22 ENCOUNTER — Encounter: Payer: Self-pay | Admitting: Internal Medicine

## 2010-12-22 ENCOUNTER — Other Ambulatory Visit (INDEPENDENT_AMBULATORY_CARE_PROVIDER_SITE_OTHER): Payer: Medicare Other

## 2010-12-22 ENCOUNTER — Telehealth: Payer: Self-pay | Admitting: Internal Medicine

## 2010-12-22 DIAGNOSIS — I10 Essential (primary) hypertension: Secondary | ICD-10-CM

## 2010-12-22 DIAGNOSIS — M545 Low back pain, unspecified: Secondary | ICD-10-CM

## 2010-12-22 DIAGNOSIS — M6740 Ganglion, unspecified site: Secondary | ICD-10-CM

## 2010-12-22 DIAGNOSIS — R945 Abnormal results of liver function studies: Secondary | ICD-10-CM

## 2010-12-22 DIAGNOSIS — R739 Hyperglycemia, unspecified: Secondary | ICD-10-CM

## 2010-12-22 LAB — COMPREHENSIVE METABOLIC PANEL
AST: 23 U/L (ref 0–37)
Albumin: 4.2 g/dL (ref 3.5–5.2)
Alkaline Phosphatase: 63 U/L (ref 39–117)
BUN: 13 mg/dL (ref 6–23)
Calcium: 9.6 mg/dL (ref 8.4–10.5)
Chloride: 97 mEq/L (ref 96–112)
Potassium: 4.3 mEq/L (ref 3.5–5.1)
Sodium: 138 mEq/L (ref 135–145)
Total Protein: 7.8 g/dL (ref 6.0–8.3)

## 2010-12-22 MED ORDER — TELMISARTAN 80 MG PO TABS: 80.0000 mg | ORAL_TABLET | Freq: Every day | ORAL | Status: DC

## 2010-12-22 MED ORDER — TRIAMTERENE-HCTZ 37.5-25 MG PO TABS: 1.0000 | ORAL_TABLET | Freq: Every day | ORAL | Status: DC

## 2010-12-22 NOTE — Telephone Encounter (Signed)
Erline Levine, please, inform patient that all labs are normal except for elev glu. Check BMET, A1c in 3 mo. Thx

## 2010-12-22 NOTE — Assessment & Plan Note (Signed)
On Rx 

## 2010-12-22 NOTE — Assessment & Plan Note (Signed)
Repeat LFTs

## 2010-12-22 NOTE — Assessment & Plan Note (Signed)
Off and on.   

## 2010-12-22 NOTE — Assessment & Plan Note (Addendum)
She will sch a procedure

## 2010-12-22 NOTE — Progress Notes (Signed)
  Subjective:    Patient ID: Kathleen Mejia, female    DOB: Apr 14, 1934, 77 y.o.   MRN: [id number]  HPI  The patient presents for a follow-up of  chronic hypertension, chronic dyslipidemia controlled with medicines   Review of Systems  Constitutional: Negative for chills, activity change, appetite change, fatigue and unexpected weight change.  HENT: Negative for congestion, mouth sores and sinus pressure.   Eyes: Negative for visual disturbance.  Respiratory: Negative for cough and chest tightness.   Gastrointestinal: Negative for nausea and abdominal pain.  Genitourinary: Negative for frequency, difficulty urinating and vaginal pain.  Musculoskeletal: Negative for back pain and gait problem.  Skin: Negative for pallor and rash.  Neurological: Negative for dizziness, tremors, weakness, numbness and headaches.  Psychiatric/Behavioral: Negative for confusion and sleep disturbance.   Wt Readings from Last 3 Encounters:  12/22/10 181 lb (82.101 kg)  09/21/10 184 lb (83.462 kg)  03/15/10 186 lb (84.369 kg)       Objective:   Physical Exam  Constitutional: She appears well-developed. No distress.       Obese  HENT:  Head: Normocephalic.  Right Ear: External ear normal.  Left Ear: External ear normal.  Nose: Nose normal.  Mouth/Throat: Oropharynx is clear and moist.  Eyes: Conjunctivae are normal. Pupils are equal, round, and reactive to light. Right eye exhibits no discharge. Left eye exhibits no discharge.  Neck: Normal range of motion. Neck supple. No JVD present. No tracheal deviation present. No thyromegaly present.  Cardiovascular: Normal rate, regular rhythm and normal heart sounds.   Pulmonary/Chest: No stridor. No respiratory distress. She has no wheezes.  Abdominal: Soft. Bowel sounds are normal. She exhibits no distension and no mass. There is no tenderness. There is no rebound and no guarding.  Musculoskeletal: She exhibits no edema and no tenderness.  Lymphadenopathy:    She has no cervical adenopathy.  Neurological: She displays normal reflexes. No cranial nerve deficit. She exhibits normal muscle tone. Coordination normal.  Skin: No rash noted. No erythema.  Psychiatric: She has a normal mood and affect. Her behavior is normal. Judgment and thought content normal.   R wrist lump lat  Procedure Note :    Procedure :   Sonography examination   Indication: R wrist growth   Equipment used: Sonosite M-Turbo with HFL38x/13-6 MHz transducer linear probe. The images were stored in the unit and later transferred in storage.  The patient was placed in a decubitus position.  This study revealed a hypoechoic 1.87x0.99 cm lesion in the lateral wrist   Impression: L wrist ganglion cyst           Assessment & Plan:

## 2010-12-23 NOTE — Telephone Encounter (Signed)
Pt informed

## 2010-12-28 ENCOUNTER — Other Ambulatory Visit: Payer: Self-pay | Admitting: Internal Medicine

## 2011-04-14 ENCOUNTER — Other Ambulatory Visit (INDEPENDENT_AMBULATORY_CARE_PROVIDER_SITE_OTHER): Payer: Medicare Other

## 2011-04-14 DIAGNOSIS — R7309 Other abnormal glucose: Secondary | ICD-10-CM

## 2011-04-14 DIAGNOSIS — R739 Hyperglycemia, unspecified: Secondary | ICD-10-CM

## 2011-04-14 LAB — BASIC METABOLIC PANEL
BUN: 12 mg/dL (ref 6–23)
CO2: 32 mEq/L (ref 19–32)
Calcium: 9.1 mg/dL (ref 8.4–10.5)
Chloride: 97 mEq/L (ref 96–112)
Creatinine, Ser: 0.5 mg/dL (ref 0.4–1.2)
Glucose, Bld: 97 mg/dL (ref 70–99)

## 2011-04-20 ENCOUNTER — Ambulatory Visit (INDEPENDENT_AMBULATORY_CARE_PROVIDER_SITE_OTHER): Payer: Medicare Other | Admitting: Internal Medicine

## 2011-04-20 ENCOUNTER — Encounter: Payer: Self-pay | Admitting: Internal Medicine

## 2011-04-20 DIAGNOSIS — I10 Essential (primary) hypertension: Secondary | ICD-10-CM

## 2011-04-20 DIAGNOSIS — E785 Hyperlipidemia, unspecified: Secondary | ICD-10-CM

## 2011-04-20 DIAGNOSIS — E559 Vitamin D deficiency, unspecified: Secondary | ICD-10-CM

## 2011-04-20 DIAGNOSIS — D485 Neoplasm of uncertain behavior of skin: Secondary | ICD-10-CM

## 2011-04-20 NOTE — Assessment & Plan Note (Signed)
Will bx

## 2011-04-20 NOTE — Assessment & Plan Note (Signed)
Continue with current prescription therapy as reflected on the Med list.  

## 2011-04-20 NOTE — Progress Notes (Signed)
  Subjective:    Patient ID: Kathleen Mejia, female    DOB: 1933-10-09, 77 y.o.   MRN: [id number]  HPI  The patient presents for a follow-up of  chronic hypertension, chronic dyslipidemia, elev glu    Review of Systems  Constitutional: Negative for chills, activity change, appetite change, fatigue and unexpected weight change.  HENT: Negative for congestion, mouth sores and sinus pressure.   Eyes: Negative for visual disturbance.  Respiratory: Negative for cough and chest tightness.   Gastrointestinal: Negative for nausea and abdominal pain.  Genitourinary: Negative for frequency, difficulty urinating and vaginal pain.  Musculoskeletal: Negative for back pain and gait problem.  Skin: Negative for pallor and rash.  Neurological: Negative for dizziness, tremors, weakness, numbness and headaches.  Psychiatric/Behavioral: Negative for confusion and sleep disturbance.   Wt Readings from Last 3 Encounters:  04/20/11 182 lb 8 oz (82.781 kg)  12/22/10 181 lb (82.101 kg)  09/21/10 184 lb (83.462 kg)       Objective:   Physical Exam  Constitutional: She appears well-developed. No distress.       obese  HENT:  Head: Normocephalic.  Right Ear: External ear normal.  Left Ear: External ear normal.  Nose: Nose normal.  Mouth/Throat: Oropharynx is clear and moist.  Eyes: Conjunctivae are normal. Pupils are equal, round, and reactive to light. Right eye exhibits no discharge. Left eye exhibits no discharge.  Neck: Normal range of motion. Neck supple. No JVD present. No tracheal deviation present. No thyromegaly present.  Cardiovascular: Normal rate, regular rhythm and normal heart sounds.   Pulmonary/Chest: No stridor. No respiratory distress. She has no wheezes.  Abdominal: Soft. Bowel sounds are normal. She exhibits no distension and no mass. There is no tenderness. There is no rebound and no guarding.  Musculoskeletal: She exhibits no edema and no tenderness.  Lymphadenopathy:    She has  no cervical adenopathy.  Neurological: She displays normal reflexes. No cranial nerve deficit. She exhibits normal muscle tone. Coordination normal.  Skin: No rash noted. No erythema.  Psychiatric: She has a normal mood and affect. Her behavior is normal. Judgment and thought content normal.   A mole on  forehead   Lab Results  Component Value Date   WBC 4.7 09/13/2010   HGB 14.6 09/13/2010   HCT 41.8 09/13/2010   PLT 204.0 09/13/2010   GLUCOSE 97 04/14/2011   CHOL 192 03/10/2010   TRIG 124.0 03/10/2010   HDL 46.70 03/10/2010   LDLDIRECT 139.3 02/21/2008   LDLCALC 121* 03/10/2010   ALT 28 12/22/2010   AST 23 12/22/2010   NA 137 04/14/2011   K 3.6 04/14/2011   CL 97 04/14/2011   CREATININE 0.5 04/14/2011   BUN 12 04/14/2011   CO2 32 04/14/2011   TSH 1.94 12/22/2010   HGBA1C 5.4 04/14/2011   MICROALBUR 0.4 06/25/2008       Assessment & Plan:

## 2011-04-20 NOTE — Patient Instructions (Signed)
Wt Readings from Last 3 Encounters:  04/20/11 182 lb 8 oz (82.781 kg)  12/22/10 181 lb (82.101 kg)  09/21/10 184 lb (83.462 kg)   BP Readings from Last 3 Encounters:  04/20/11 138/80  12/22/10 120/70  09/21/10 140/82

## 2011-04-20 NOTE — Assessment & Plan Note (Signed)
On fish oil 

## 2011-06-28 ENCOUNTER — Other Ambulatory Visit: Payer: Self-pay | Admitting: Internal Medicine

## 2011-06-28 DIAGNOSIS — Z1231 Encounter for screening mammogram for malignant neoplasm of breast: Secondary | ICD-10-CM

## 2011-07-10 ENCOUNTER — Ambulatory Visit
Admission: RE | Admit: 2011-07-10 | Discharge: 2011-07-10 | Disposition: A | Discharge status: Home / Self Care | Payer: Medicare Other | Source: Ambulatory Visit | Attending: Internal Medicine | Admitting: Internal Medicine

## 2011-07-10 DIAGNOSIS — Z1231 Encounter for screening mammogram for malignant neoplasm of breast: Secondary | ICD-10-CM

## 2011-07-17 ENCOUNTER — Telehealth: Payer: Self-pay | Admitting: *Deleted

## 2011-07-17 ENCOUNTER — Emergency Department (HOSPITAL_COMMUNITY): Payer: Medicare Other

## 2011-07-17 ENCOUNTER — Encounter (HOSPITAL_COMMUNITY): Payer: Self-pay | Admitting: *Deleted

## 2011-07-17 ENCOUNTER — Emergency Department (HOSPITAL_COMMUNITY)
Admission: EM | Admit: 2011-07-17 | Discharge: 2011-07-17 | Disposition: A | Discharge status: Home / Self Care | Payer: Medicare Other | Attending: Emergency Medicine | Admitting: Emergency Medicine

## 2011-07-17 DIAGNOSIS — M21829 Other specified acquired deformities of unspecified upper arm: Secondary | ICD-10-CM

## 2011-07-17 DIAGNOSIS — I10 Essential (primary) hypertension: Secondary | ICD-10-CM | POA: Insufficient documentation

## 2011-07-17 DIAGNOSIS — S43016A Anterior dislocation of unspecified humerus, initial encounter: Secondary | ICD-10-CM | POA: Insufficient documentation

## 2011-07-17 DIAGNOSIS — S43006A Unspecified dislocation of unspecified shoulder joint, initial encounter: Secondary | ICD-10-CM

## 2011-07-17 DIAGNOSIS — E785 Hyperlipidemia, unspecified: Secondary | ICD-10-CM | POA: Insufficient documentation

## 2011-07-17 DIAGNOSIS — W07XXXA Fall from chair, initial encounter: Secondary | ICD-10-CM | POA: Insufficient documentation

## 2011-07-17 DIAGNOSIS — Y92009 Unspecified place in unspecified non-institutional (private) residence as the place of occurrence of the external cause: Secondary | ICD-10-CM | POA: Insufficient documentation

## 2011-07-17 DIAGNOSIS — M25519 Pain in unspecified shoulder: Secondary | ICD-10-CM | POA: Insufficient documentation

## 2011-07-17 MED ORDER — MORPHINE SULFATE 4 MG/ML IJ SOLN
4.0000 mg | Freq: Once | INTRAMUSCULAR | Status: AC
  Administered 2011-07-17: 4 mg via INTRAVENOUS
  Filled 2011-07-17: qty 1

## 2011-07-17 MED ORDER — MIDAZOLAM HCL 2 MG/2ML IJ SOLN
1.0000 mg | Freq: Once | INTRAMUSCULAR | Status: AC
  Administered 2011-07-17: 2 mg via INTRAVENOUS
  Filled 2011-07-17: qty 2

## 2011-07-17 NOTE — Telephone Encounter (Signed)
Pt's son calling stating pt had fallen. She c/o arm/shoulder pain. She cant move it. I advised him to take her to ER now.

## 2011-07-17 NOTE — ED Notes (Signed)
Pt states she was sitting at her table and went to get up and the chair tipped over. States when she fell, her arm ended up under the chair with her weight on top of the chair and her arm.  Pt c/o pain to her right arm and shoulder. Denies any other injuries or c/o.

## 2011-07-17 NOTE — ED Provider Notes (Addendum)
History     CSN: [id number]  Arrival date & time 07/17/11  1233   First MD Initiated Contact with Patient 07/17/11 1343      Chief Complaint  Patient presents with  . Fall  . Arm Pain  . Leg Pain   the patient fell in her home from a chair. She was trying to switch between 2 different chairs and fell down onto her right shoulder. She complains of pain at the right shoulder. She denies any injury to the head, neck or pelvis. Denies any back pain. She was able to name bili from her kitchen to bathroom and then called. Her son, who helped her come in. She had no loss of consciousness. No preceding symptoms  (Consider location/radiation/quality/duration/timing/severity/associated sxs/prior treatment) HPI  Past Medical History  Diagnosis Date  . HTN (hypertension)   . LBP (low back pain)   . Hyperlipidemia   . Fatty liver   . Gallstone     1.5 cm  . Osteoarthritis   . Rosacea     Past Surgical History  Procedure Date  . Tonsillectomy     Family History  Problem Relation Age of Onset  . Pancreatic cancer Mother   . Hypertension Mother     History  Substance Use Topics  . Smoking status: Never Smoker   . Smokeless tobacco: Not on file  . Alcohol Use: No    OB History    Grav Para Term Preterm Abortions TAB SAB Ect Mult Living                  Review of Systems  All other systems reviewed and are negative.    Allergies  Review of patient's allergies indicates no known allergies.  Home Medications   Current Outpatient Rx  Name Route Sig Dispense Refill  . VITAMIN C PO Oral Take 1 tablet by mouth daily.    . B COMPLEX PO TABS Oral Take 1 tablet by mouth daily.    . CHOLECALCIFEROL 1000 UNITS PO TABS Oral Take 1,000 Units by mouth daily.      . CO Q 10 PO Oral Take 1 capsule by mouth daily.    . OMEGA-3 FATTY ACIDS 1000 MG PO CAPS Oral Take 2 g by mouth 2 (two) times daily.      [name] MICARDIS 80 MG PO TABS  TAKE 1 TABLET BY MOUTH EVERY DAY 30 tablet 5  .  ADULT MULTIVITAMIN W/MINERALS CH Oral Take 1 tablet by mouth daily.    [name] ZINC PO Oral Take 1 tablet by mouth daily.    . TRIAMTERENE-HCTZ 37.5-25 MG PO TABS  TAKE 1 TABLET BY MOUTH EVERY DAY 30 tablet 5    BP 152/76  Pulse 76  Temp(Src) 97.9 F (36.6 C) (Oral)  Resp 18  Wt 183 lb (83.008 kg)  SpO2 100%  Physical Exam  Nursing note and vitals reviewed. Constitutional: She is oriented to person, place, and time. She appears well-developed and well-nourished.  HENT:  Head: Normocephalic and atraumatic.  Eyes: Conjunctivae and EOM are normal. Pupils are equal, round, and reactive to light.  Neck: Neck supple.  Cardiovascular: Normal rate and regular rhythm.  Exam reveals no gallop and no friction rub.   No murmur heard. Pulmonary/Chest: Breath sounds normal. She has no wheezes. She has no rales. She exhibits no tenderness.  Abdominal: Soft. Bowel sounds are normal. She exhibits no distension. There is no tenderness. There is no rebound and no guarding.  Musculoskeletal: Normal  range of motion. She exhibits edema and tenderness.       Swelling and tenderness to the right shoulder and right clavicle area. Range of motion somewhat limited  Neurological: She is alert and oriented to person, place, and time. No cranial nerve deficit. Coordination normal.  Skin: Skin is warm and dry. No rash noted.  Psychiatric: She has a normal mood and affect.    ED Course  Procedures (including critical care time):  Closed R shoulder reduction with [facility] Technique   Labs Reviewed - No data to display Dg Shoulder 1v Right  07/17/2011  *RADIOLOGY REPORT*  Clinical Data: Post reduction shoulder dislocation.  RIGHT SHOULDER - 1 VIEW  Comparison: 07/17/2011 at 1408 hours.  Findings: There has been interval reduction of an anterior right shoulder dislocation.  Previously discussed Hill-Sachs lesion is not as well seen on this single view.  Degenerative changes are seen in the right acromioclavicular  joint.  Visualized portion of the right chest is unremarkable.  IMPRESSION:  1.  Interval reduction of an anterior right shoulder dislocation. 2.  Previously seen and discussed Hill-Sachs lesion is better seen on the prior study. 3.  Right acromioclavicular joint osteoarthritis.  Original Report Authenticated By: Luretha Rued, M.D.   Dg Shoulder Right  07/17/2011  *RADIOLOGY REPORT*  Clinical Data: Right shoulder pain secondary to a fall.  RIGHT SHOULDER - 2+ VIEW  Comparison: None.  Findings: There is anterior dislocation of the right humeral head with a Hill-Sachs lesion.  No other acute abnormality.  IMPRESSION: Anterior dislocation of the right humeral head with a Hill-Sachs lesion.  Original Report Authenticated By: Larey Seat, M.D.     1. Shoulder dislocation   2. Hill Sachs deformity       MDM  Patient is seen and examined, initial history and physical is completed. Evaluation initiated    Results for orders placed in visit on 04/14/11  HEMOGLOBIN A1C      Component Value Range   Hemoglobin A1C 5.4  4.6 - 6.5 (%)  BASIC METABOLIC PANEL      Component Value Range   Sodium 137  135 - 145 (mEq/L)   Potassium 3.6  3.5 - 5.1 (mEq/L)   Chloride 97  96 - 112 (mEq/L)   CO2 32  19 - 32 (mEq/L)   Glucose, Bld 97  70 - 99 (mg/dL)   BUN 12  6 - 23 (mg/dL)   Creatinine, Ser 0.5  0.4 - 1.2 (mg/dL)   Calcium 9.1  8.4 - 10.5 (mg/dL)   GFR 119.27  >60.00 (mL/min)   Dg Shoulder Right  07/17/2011  *RADIOLOGY REPORT*  Clinical Data: Right shoulder pain secondary to a fall.  RIGHT SHOULDER - 2+ VIEW  Comparison: None.  Findings: There is anterior dislocation of the right humeral head with a Hill-Sachs lesion.  No other acute abnormality.  IMPRESSION: Anterior dislocation of the right humeral head with a Hill-Sachs lesion.  Original Report Authenticated By: Larey Seat, M.D.      X-rays shows anterior dislocation of the right humeral head with a Hill Sachs deformity. Will  give some gentle  pain. Medications, including morphine, and 1 mg of Versed and, then we'll attempt the Cunningham technique to reduce it without formal conscious sedation.   8:11 PM  Procedure:  Closed reduction of anterior shoulder dislocation  Patient was given a low-dose of morphine and 1 mg of Versed. The Cunningham technique was attempted with light sedation. Shoulder reduced with Candis Schatz  Technique. At this point in time. The patient appears to be able to move her shoulder with good range of motion. Will order portable shoulder x-ray to confirm placement.  Pt tolerated procedure well, no complications.  Neurovasc. Intact before and after procedure.    '      Darly Massi A. Lauris Poag, MD 07/17/11 Antelope Lauris Poag, MD 07/17/11 2012  Collier Salina A. Lauris Poag, MD 08/13/11 1329

## 2011-07-17 NOTE — Telephone Encounter (Signed)
Noted F/u OV if needed Thx

## 2011-07-17 NOTE — ED Notes (Signed)
Pt states "I fell, the chair went with me and my arm was between the chair & the floor and I was on top of it, my right arm hurts, my right leg hurts"

## 2011-08-08 ENCOUNTER — Ambulatory Visit: Payer: Medicare Other

## 2011-08-14 ENCOUNTER — Ambulatory Visit: Payer: Medicare Other | Attending: Orthopedic Surgery

## 2011-08-14 DIAGNOSIS — M25619 Stiffness of unspecified shoulder, not elsewhere classified: Secondary | ICD-10-CM | POA: Insufficient documentation

## 2011-08-14 DIAGNOSIS — IMO0001 Reserved for inherently not codable concepts without codable children: Secondary | ICD-10-CM | POA: Insufficient documentation

## 2011-08-14 DIAGNOSIS — M6281 Muscle weakness (generalized): Secondary | ICD-10-CM | POA: Insufficient documentation

## 2011-08-14 DIAGNOSIS — M25519 Pain in unspecified shoulder: Secondary | ICD-10-CM | POA: Insufficient documentation

## 2011-08-14 DIAGNOSIS — R5381 Other malaise: Secondary | ICD-10-CM | POA: Insufficient documentation

## 2011-08-21 ENCOUNTER — Ambulatory Visit: Payer: Medicare Other | Admitting: Physical Therapy

## 2011-08-28 ENCOUNTER — Ambulatory Visit: Payer: Medicare Other

## 2011-09-07 ENCOUNTER — Ambulatory Visit: Payer: Medicare Other | Attending: Orthopedic Surgery | Admitting: Physical Therapy

## 2011-09-07 DIAGNOSIS — R5381 Other malaise: Secondary | ICD-10-CM | POA: Insufficient documentation

## 2011-09-07 DIAGNOSIS — M25519 Pain in unspecified shoulder: Secondary | ICD-10-CM | POA: Insufficient documentation

## 2011-09-07 DIAGNOSIS — M6281 Muscle weakness (generalized): Secondary | ICD-10-CM | POA: Insufficient documentation

## 2011-09-07 DIAGNOSIS — M25619 Stiffness of unspecified shoulder, not elsewhere classified: Secondary | ICD-10-CM | POA: Insufficient documentation

## 2011-09-07 DIAGNOSIS — IMO0001 Reserved for inherently not codable concepts without codable children: Secondary | ICD-10-CM | POA: Insufficient documentation

## 2011-09-12 ENCOUNTER — Ambulatory Visit: Payer: Medicare Other

## 2011-09-19 ENCOUNTER — Ambulatory Visit: Payer: Medicare Other

## 2011-09-26 ENCOUNTER — Ambulatory Visit: Payer: Medicare Other

## 2011-10-03 ENCOUNTER — Ambulatory Visit: Payer: Medicare Other | Admitting: Physical Therapy

## 2011-10-10 ENCOUNTER — Ambulatory Visit: Payer: Medicare Other | Attending: Orthopedic Surgery

## 2011-10-10 DIAGNOSIS — IMO0001 Reserved for inherently not codable concepts without codable children: Secondary | ICD-10-CM | POA: Insufficient documentation

## 2011-10-10 DIAGNOSIS — M6281 Muscle weakness (generalized): Secondary | ICD-10-CM | POA: Insufficient documentation

## 2011-10-10 DIAGNOSIS — R5381 Other malaise: Secondary | ICD-10-CM | POA: Insufficient documentation

## 2011-10-10 DIAGNOSIS — M25619 Stiffness of unspecified shoulder, not elsewhere classified: Secondary | ICD-10-CM | POA: Insufficient documentation

## 2011-10-10 DIAGNOSIS — M25519 Pain in unspecified shoulder: Secondary | ICD-10-CM | POA: Insufficient documentation

## 2011-10-17 ENCOUNTER — Ambulatory Visit: Payer: Medicare Other | Admitting: Physical Therapy

## 2011-10-19 ENCOUNTER — Ambulatory Visit (INDEPENDENT_AMBULATORY_CARE_PROVIDER_SITE_OTHER): Payer: Medicare Other | Admitting: Internal Medicine

## 2011-10-19 ENCOUNTER — Encounter: Payer: Self-pay | Admitting: Internal Medicine

## 2011-10-19 ENCOUNTER — Other Ambulatory Visit (INDEPENDENT_AMBULATORY_CARE_PROVIDER_SITE_OTHER): Payer: Medicare Other

## 2011-10-19 VITALS — BP 124/76 | HR 88 | Temp 98.1°F | Resp 16 | Wt 189.0 lb

## 2011-10-19 DIAGNOSIS — Z79899 Other long term (current) drug therapy: Secondary | ICD-10-CM

## 2011-10-19 DIAGNOSIS — R202 Paresthesia of skin: Secondary | ICD-10-CM

## 2011-10-19 DIAGNOSIS — M545 Low back pain, unspecified: Secondary | ICD-10-CM

## 2011-10-19 DIAGNOSIS — E785 Hyperlipidemia, unspecified: Secondary | ICD-10-CM

## 2011-10-19 DIAGNOSIS — R209 Unspecified disturbances of skin sensation: Secondary | ICD-10-CM

## 2011-10-19 DIAGNOSIS — R945 Abnormal results of liver function studies: Secondary | ICD-10-CM

## 2011-10-19 DIAGNOSIS — I10 Essential (primary) hypertension: Secondary | ICD-10-CM

## 2011-10-19 DIAGNOSIS — R5381 Other malaise: Secondary | ICD-10-CM

## 2011-10-19 LAB — CBC WITH DIFFERENTIAL/PLATELET
Basophils Relative: 0.4 % (ref 0.0–3.0)
Eosinophils Relative: 2.6 % (ref 0.0–5.0)
HCT: 42.2 % (ref 36.0–46.0)
Monocytes Relative: 9.7 % (ref 3.0–12.0)
Neutrophils Relative %: 58.8 % (ref 43.0–77.0)
Platelets: 228 10*3/uL (ref 150.0–400.0)
RBC: 4.39 Mil/uL (ref 3.87–5.11)
WBC: 6.8 10*3/uL (ref 4.5–10.5)

## 2011-10-19 LAB — HEPATIC FUNCTION PANEL
ALT: 28 U/L (ref 0–35)
Albumin: 3.9 g/dL (ref 3.5–5.2)
Bilirubin, Direct: 0.1 mg/dL (ref 0.0–0.3)
Total Protein: 7 g/dL (ref 6.0–8.3)

## 2011-10-19 LAB — VITAMIN B12: Vitamin B-12: 661 pg/mL (ref 211–911)

## 2011-10-19 LAB — BASIC METABOLIC PANEL
BUN: 16 mg/dL (ref 6–23)
Chloride: 100 mEq/L (ref 96–112)
Creatinine, Ser: 0.6 mg/dL (ref 0.4–1.2)
GFR: 99.38 mL/min (ref >60.00)
Potassium: 4.5 mEq/L (ref 3.5–5.1)

## 2011-10-19 NOTE — Assessment & Plan Note (Signed)
Continue with current prescription therapy as reflected on the Med list.  

## 2011-10-19 NOTE — Patient Instructions (Signed)
BP Readings from Last 3 Encounters:  10/19/11 124/76  07/17/11 152/76  04/20/11 138/80    Wt Readings from Last 3 Encounters:  10/19/11 189 lb (85.73 kg)  07/17/11 183 lb (83.008 kg)  04/20/11 182 lb 8 oz (82.781 kg)

## 2011-10-19 NOTE — Assessment & Plan Note (Signed)
Monitoring labs 

## 2011-10-19 NOTE — Progress Notes (Signed)
Patient ID: Kathleen Mejia, female   DOB: 02-12-1934, 78 y.o.   MRN: [id number]  Subjective:    Patient ID: Kathleen Mejia, female    DOB: 08-Jan-1934, 78 y.o.   MRN: [id number]  HPI  The patient presents for a follow-up of  chronic hypertension, chronic dyslipidemia, elev glu She is in PT for R shoulder rot cuff injury - dislocated in Feb  BP Readings from Last 3 Encounters:  10/19/11 124/76  07/17/11 152/76  04/20/11 138/80     Review of Systems  Constitutional: Negative for chills, activity change, appetite change, fatigue and unexpected weight change.  HENT: Negative for congestion, mouth sores and sinus pressure.   Eyes: Negative for visual disturbance.  Respiratory: Negative for cough and chest tightness.   Gastrointestinal: Negative for nausea and abdominal pain.  Genitourinary: Negative for frequency, difficulty urinating and vaginal pain.  Musculoskeletal: Negative for back pain and gait problem.  Skin: Negative for pallor and rash.  Neurological: Negative for dizziness, tremors, weakness, numbness and headaches.  Psychiatric/Behavioral: Negative for confusion and sleep disturbance.   Wt Readings from Last 3 Encounters:  10/19/11 189 lb (85.73 kg)  07/17/11 183 lb (83.008 kg)  04/20/11 182 lb 8 oz (82.781 kg)       Objective:   Physical Exam  Constitutional: She appears well-developed. No distress.       obese  HENT:  Head: Normocephalic.  Right Ear: External ear normal.  Left Ear: External ear normal.  Nose: Nose normal.  Mouth/Throat: Oropharynx is clear and moist.  Eyes: Conjunctivae are normal. Pupils are equal, round, and reactive to light. Right eye exhibits no discharge. Left eye exhibits no discharge.  Neck: Normal range of motion. Neck supple. No JVD present. No tracheal deviation present. No thyromegaly present.  Cardiovascular: Normal rate, regular rhythm and normal heart sounds.   Pulmonary/Chest: No stridor. No respiratory distress. She has no wheezes.   Abdominal: Soft. Bowel sounds are normal. She exhibits no distension and no mass. There is no tenderness. There is no rebound and no guarding.  Musculoskeletal: She exhibits no edema and no tenderness.  Lymphadenopathy:    She has no cervical adenopathy.  Neurological: She displays normal reflexes. No cranial nerve deficit. She exhibits normal muscle tone. Coordination normal.  Skin: No rash noted. No erythema.  Psychiatric: She has a normal mood and affect. Her behavior is normal. Judgment and thought content normal.  R shoulder is tender w/decr ROM    Lab Results  Component Value Date   WBC 4.7 09/13/2010   HGB 14.6 09/13/2010   HCT 41.8 09/13/2010   PLT 204.0 09/13/2010   GLUCOSE 97 04/14/2011   CHOL 192 03/10/2010   TRIG 124.0 03/10/2010   HDL 46.70 03/10/2010   LDLDIRECT 139.3 02/21/2008   LDLCALC 121* 03/10/2010   ALT 28 12/22/2010   AST 23 12/22/2010   NA 137 04/14/2011   K 3.6 04/14/2011   CL 97 04/14/2011   CREATININE 0.5 04/14/2011   BUN 12 04/14/2011   CO2 32 04/14/2011   TSH 1.94 12/22/2010   HGBA1C 5.4 04/14/2011   MICROALBUR 0.4 06/25/2008       Assessment & Plan:

## 2011-10-19 NOTE — Assessment & Plan Note (Signed)
  On diet  

## 2011-10-19 NOTE — Assessment & Plan Note (Signed)
Chronic MSK

## 2011-10-24 ENCOUNTER — Ambulatory Visit: Payer: Medicare Other

## 2011-10-25 ENCOUNTER — Ambulatory Visit: Payer: Medicare Other

## 2011-10-31 ENCOUNTER — Ambulatory Visit: Payer: Medicare Other

## 2012-01-10 ENCOUNTER — Other Ambulatory Visit: Payer: Self-pay | Admitting: Internal Medicine

## 2012-01-25 ENCOUNTER — Ambulatory Visit (INDEPENDENT_AMBULATORY_CARE_PROVIDER_SITE_OTHER): Payer: Medicare Other | Admitting: Internal Medicine

## 2012-01-25 ENCOUNTER — Other Ambulatory Visit (INDEPENDENT_AMBULATORY_CARE_PROVIDER_SITE_OTHER): Payer: Medicare Other

## 2012-01-25 ENCOUNTER — Other Ambulatory Visit: Payer: Medicare Other

## 2012-01-25 ENCOUNTER — Encounter: Payer: Self-pay | Admitting: Internal Medicine

## 2012-01-25 VITALS — BP 148/84 | HR 88 | Temp 98.4°F | Resp 16 | Wt 189.0 lb

## 2012-01-25 DIAGNOSIS — R739 Hyperglycemia, unspecified: Secondary | ICD-10-CM

## 2012-01-25 DIAGNOSIS — R0989 Other specified symptoms and signs involving the circulatory and respiratory systems: Secondary | ICD-10-CM

## 2012-01-25 DIAGNOSIS — R0600 Dyspnea, unspecified: Secondary | ICD-10-CM

## 2012-01-25 DIAGNOSIS — I10 Essential (primary) hypertension: Secondary | ICD-10-CM

## 2012-01-25 DIAGNOSIS — E785 Hyperlipidemia, unspecified: Secondary | ICD-10-CM

## 2012-01-25 DIAGNOSIS — R51 Headache: Secondary | ICD-10-CM

## 2012-01-25 DIAGNOSIS — R0789 Other chest pain: Secondary | ICD-10-CM

## 2012-01-25 DIAGNOSIS — R519 Headache, unspecified: Secondary | ICD-10-CM

## 2012-01-25 DIAGNOSIS — R5383 Other fatigue: Secondary | ICD-10-CM

## 2012-01-25 DIAGNOSIS — L570 Actinic keratosis: Secondary | ICD-10-CM

## 2012-01-25 DIAGNOSIS — B029 Zoster without complications: Secondary | ICD-10-CM

## 2012-01-25 DIAGNOSIS — R5381 Other malaise: Secondary | ICD-10-CM

## 2012-01-25 DIAGNOSIS — M545 Low back pain, unspecified: Secondary | ICD-10-CM

## 2012-01-25 DIAGNOSIS — R7309 Other abnormal glucose: Secondary | ICD-10-CM

## 2012-01-25 LAB — BASIC METABOLIC PANEL
Calcium: 9.1 mg/dL (ref 8.4–10.5)
Chloride: 98 mEq/L (ref 96–112)
Creatinine, Ser: 0.6 mg/dL (ref 0.4–1.2)
GFR: 107.26 mL/min (ref >60.00)

## 2012-01-25 LAB — HEMOGLOBIN A1C: Hgb A1c MFr Bld: 5.4 % (ref 4.6–6.5)

## 2012-01-25 MED ORDER — TRIAMTERENE-HCTZ 75-50 MG PO TABS: 1.0000 | ORAL_TABLET | Freq: Every day | ORAL | Status: DC

## 2012-01-25 NOTE — Assessment & Plan Note (Signed)
Not too well controlled Increase Maxzide dose to 50/75

## 2012-01-25 NOTE — Progress Notes (Signed)
Patient ID: ADJA DADISMAN, female   DOB: July 08, 1933, 78 y.o.   MRN: [id number] Patient ID: LINNA HOLMON, female   DOB: [birth year], 78 y.o.   MRN: [id number]  Subjective:    Patient ID: DIANELYS GENGLER, female    DOB: Sep 28, 1933, 78 y.o.   MRN: [id number]  HPI  The patient presents for a follow-up of  chronic hypertension, chronic dyslipidemia, elev glu She is in PT for R shoulder rot cuff injury - dislocated in Feb - better  BP Readings from Last 3 Encounters:  01/25/12 148/84  10/19/11 124/76  07/17/11 152/76     Review of Systems  Constitutional: Negative for chills, activity change, appetite change, fatigue and unexpected weight change.  HENT: Negative for congestion, mouth sores and sinus pressure.   Eyes: Negative for visual disturbance.  Respiratory: Negative for cough and chest tightness.   Gastrointestinal: Negative for nausea and abdominal pain.  Genitourinary: Negative for frequency, difficulty urinating and vaginal pain.  Musculoskeletal: Negative for back pain and gait problem.  Skin: Negative for pallor and rash.  Neurological: Negative for dizziness, tremors, weakness, numbness and headaches.  Psychiatric/Behavioral: Negative for confusion and disturbed wake/sleep cycle.   Wt Readings from Last 3 Encounters:  01/25/12 189 lb (85.73 kg)  10/19/11 189 lb (85.73 kg)  07/17/11 183 lb (83.008 kg)       Objective:   Physical Exam  Constitutional: She appears well-developed. No distress.       obese  HENT:  Head: Normocephalic.  Right Ear: External ear normal.  Left Ear: External ear normal.  Nose: Nose normal.  Mouth/Throat: Oropharynx is clear and moist.  Eyes: Conjunctivae are normal. Pupils are equal, round, and reactive to light. Right eye exhibits no discharge. Left eye exhibits no discharge.  Neck: Normal range of motion. Neck supple. No JVD present. No tracheal deviation present. No thyromegaly present.  Cardiovascular: Normal rate, regular rhythm and normal  heart sounds.   Pulmonary/Chest: No stridor. No respiratory distress. She has no wheezes.  Abdominal: Soft. Bowel sounds are normal. She exhibits no distension and no mass. There is no tenderness. There is no rebound and no guarding.  Musculoskeletal: She exhibits no edema and no tenderness.  Lymphadenopathy:    She has no cervical adenopathy.  Neurological: She displays normal reflexes. No cranial nerve deficit. She exhibits normal muscle tone. Coordination normal.  Skin: No rash noted. No erythema.  Psychiatric: She has a normal mood and affect. Her behavior is normal. Judgment and thought content normal.  R shoulder is less tender w/decr ROM R wrist AK   Lab Results  Component Value Date   WBC 6.8 10/19/2011   HGB 14.2 10/19/2011   HCT 42.2 10/19/2011   PLT 228.0 10/19/2011   GLUCOSE 135* 10/19/2011   CHOL 192 03/10/2010   TRIG 124.0 03/10/2010   HDL 46.70 03/10/2010   LDLDIRECT 139.3 02/21/2008   LDLCALC 121* 03/10/2010   ALT 28 10/19/2011   AST 23 10/19/2011   NA 141 10/19/2011   K 4.5 10/19/2011   CL 100 10/19/2011   CREATININE 0.6 10/19/2011   BUN 16 10/19/2011   CO2 33* 10/19/2011   TSH 1.89 10/19/2011   HGBA1C 5.4 04/14/2011   MICROALBUR 0.4 06/25/2008    Procedure Note :     Procedure : Cryosurgery   Indication:  Actinic keratosis(es)   Risks including unsuccessful procedure , bleeding, infection, bruising, scar, a need for a repeat  procedure and others were explained to the patient  in detail as well as the benefits. Informed consent was obtained verbally.   1  lesion(s)  on R wrist   was/were treated with liquid nitrogen on a Q-tip in a usual fasion . Band-Aid was applied and antibiotic ointment was given for a later use.   Tolerated well. Complications none.   Postprocedure instructions :     Keep the wounds clean. You can wash them with liquid soap and water. Pat dry with gauze or a Kleenex tissue  Before applying antibiotic ointment and a Band-Aid.   You need to report  immediately  if  any signs of infection develop.       Assessment & Plan:

## 2012-01-25 NOTE — Assessment & Plan Note (Signed)
Depomedrol 120 mg im Eye drops

## 2012-01-25 NOTE — Patient Instructions (Signed)
   Postprocedure instructions :     Keep the wounds clean. You can wash them with liquid soap and water. Pat dry with gauze or a Kleenex tissue  Before applying antibiotic ointment and a Band-Aid.   You need to report immediately  if  any signs of infection develop.    

## 2012-01-25 NOTE — Assessment & Plan Note (Signed)
On fish oil 

## 2012-01-25 NOTE — Assessment & Plan Note (Signed)
Acyclovir x 10 d

## 2012-01-25 NOTE — Assessment & Plan Note (Signed)
Discussed.

## 2012-01-25 NOTE — Assessment & Plan Note (Signed)
Ok Tylenol

## 2012-01-26 DIAGNOSIS — R0600 Dyspnea, unspecified: Secondary | ICD-10-CM | POA: Insufficient documentation

## 2012-01-26 NOTE — Assessment & Plan Note (Signed)
ECHO stress test Wt loss discussed

## 2012-02-06 ENCOUNTER — Other Ambulatory Visit (HOSPITAL_COMMUNITY): Payer: Medicare Other

## 2012-02-07 ENCOUNTER — Ambulatory Visit (HOSPITAL_COMMUNITY): Payer: Medicare Other | Attending: Cardiovascular Disease

## 2012-02-07 ENCOUNTER — Other Ambulatory Visit (HOSPITAL_COMMUNITY): Payer: Medicare Other

## 2012-02-07 DIAGNOSIS — R0989 Other specified symptoms and signs involving the circulatory and respiratory systems: Secondary | ICD-10-CM | POA: Insufficient documentation

## 2012-02-07 DIAGNOSIS — I10 Essential (primary) hypertension: Secondary | ICD-10-CM | POA: Insufficient documentation

## 2012-02-07 DIAGNOSIS — R0600 Dyspnea, unspecified: Secondary | ICD-10-CM

## 2012-02-07 DIAGNOSIS — R0609 Other forms of dyspnea: Secondary | ICD-10-CM | POA: Insufficient documentation

## 2012-02-07 DIAGNOSIS — R0789 Other chest pain: Secondary | ICD-10-CM

## 2012-02-07 DIAGNOSIS — R072 Precordial pain: Secondary | ICD-10-CM

## 2012-02-07 DIAGNOSIS — R5381 Other malaise: Secondary | ICD-10-CM | POA: Insufficient documentation

## 2012-02-07 NOTE — Progress Notes (Signed)
Echocardiogram performed.  

## 2012-02-08 ENCOUNTER — Telehealth: Payer: Self-pay | Admitting: Internal Medicine

## 2012-02-08 NOTE — Telephone Encounter (Signed)
Pt informed

## 2012-02-08 NOTE — Telephone Encounter (Signed)
Kathleen Mejia, please, inform patient that he stress test was normal Thx!

## 2012-03-11 ENCOUNTER — Ambulatory Visit (INDEPENDENT_AMBULATORY_CARE_PROVIDER_SITE_OTHER): Payer: Medicare Other | Admitting: Internal Medicine

## 2012-03-11 ENCOUNTER — Encounter: Payer: Self-pay | Admitting: Internal Medicine

## 2012-03-11 VITALS — BP 110/64 | HR 80 | Temp 98.3°F | Resp 16 | Wt 190.0 lb

## 2012-03-11 DIAGNOSIS — R42 Dizziness and giddiness: Secondary | ICD-10-CM | POA: Insufficient documentation

## 2012-03-11 DIAGNOSIS — I10 Essential (primary) hypertension: Secondary | ICD-10-CM

## 2012-03-11 MED ORDER — TRIAMTERENE-HCTZ 75-50 MG PO TABS: 1.0000 | ORAL_TABLET | Freq: Every day | ORAL | Status: DC

## 2012-03-11 MED ORDER — MECLIZINE HCL 12.5 MG PO TABS: 12.5000 mg | ORAL_TABLET | Freq: Three times a day (TID) | ORAL | Status: DC | PRN

## 2012-03-11 NOTE — Patient Instructions (Addendum)
Benign Positional Vertigo symptoms on the right. Start Meclizine as needed. Start Laruth Bouchard - Daroff exercise several times a day as dirrected. No driving if dizzy

## 2012-03-11 NOTE — Assessment & Plan Note (Signed)
Benign Positional Vertigo symptoms on the R. Start Meclizine. Start Brandt - Daroff exercise several times a day as dirrected.  

## 2012-03-11 NOTE — Progress Notes (Signed)
  Subjective:    Patient ID: Kathleen Mejia, female    DOB: [birth year], 78 y.o.   MRN: [id number]  HPI  C/o vertigo with position change in bed The patient presents for a follow-up of  chronic hypertension, chronic dyslipidemia, elev glu She is in PT for R shoulder rot cuff injury - dislocated in Feb - better  BP Readings from Last 3 Encounters:  03/11/12 110/64  01/25/12 148/84  10/19/11 124/76     Review of Systems  Constitutional: Negative for chills, activity change, appetite change, fatigue and unexpected weight change.  HENT: Negative for congestion, mouth sores and sinus pressure.   Eyes: Negative for visual disturbance.  Respiratory: Negative for cough and chest tightness.   Gastrointestinal: Negative for nausea and abdominal pain.  Genitourinary: Negative for frequency, difficulty urinating and vaginal pain.  Musculoskeletal: Negative for back pain and gait problem.  Skin: Negative for pallor and rash.  Neurological: Negative for dizziness, tremors, weakness, numbness and headaches.  Psychiatric/Behavioral: Negative for confusion and disturbed wake/sleep cycle.   Wt Readings from Last 3 Encounters:  03/11/12 190 lb (86.183 kg)  01/25/12 189 lb (85.73 kg)  10/19/11 189 lb (85.73 kg)       Objective:   Physical Exam  Constitutional: She appears well-developed. No distress.       obese  HENT:  Head: Normocephalic.  Right Ear: External ear normal.  Left Ear: External ear normal.  Nose: Nose normal.  Mouth/Throat: Oropharynx is clear and moist.  Eyes: Conjunctivae normal are normal. Pupils are equal, round, and reactive to light. Right eye exhibits no discharge. Left eye exhibits no discharge.  Neck: Normal range of motion. Neck supple. No JVD present. No tracheal deviation present. No thyromegaly present.  Cardiovascular: Normal rate, regular rhythm and normal heart sounds.   Pulmonary/Chest: No stridor. No respiratory distress. She has no wheezes.  Abdominal:  Soft. Bowel sounds are normal. She exhibits no distension and no mass. There is no tenderness. There is no rebound and no guarding.  Musculoskeletal: She exhibits no edema and no tenderness.  Lymphadenopathy:    She has no cervical adenopathy.  Neurological: She is alert. She has normal reflexes. She displays normal reflexes. No cranial nerve deficit. She exhibits normal muscle tone. Coordination normal.  Skin: No rash noted. No erythema.  Psychiatric: She has a normal mood and affect. Her behavior is normal. Judgment and thought content normal.  R shoulder is less tender w/decr ROM H-P is (+) on R   Lab Results  Component Value Date   WBC 6.8 10/19/2011   HGB 14.2 10/19/2011   HCT 42.2 10/19/2011   PLT 228.0 10/19/2011   GLUCOSE 114* 01/25/2012   CHOL 192 03/10/2010   TRIG 124.0 03/10/2010   HDL 46.70 03/10/2010   LDLDIRECT 139.3 02/21/2008   LDLCALC 121* 03/10/2010   ALT 28 10/19/2011   AST 23 10/19/2011   NA 134* 01/25/2012   K 3.5 01/25/2012   CL 98 01/25/2012   CREATININE 0.6 01/25/2012   BUN 10 01/25/2012   CO2 30 01/25/2012   TSH 1.89 10/19/2011   HGBA1C 5.4 01/25/2012   MICROALBUR 0.4 06/25/2008         Assessment & Plan:

## 2012-03-11 NOTE — Assessment & Plan Note (Addendum)
Better Continue with current prescription therapy as reflected on the Med list. Maxzide - take in am, Micardis - at hs

## 2012-05-09 ENCOUNTER — Encounter: Payer: Self-pay | Admitting: Internal Medicine

## 2012-05-09 ENCOUNTER — Ambulatory Visit (INDEPENDENT_AMBULATORY_CARE_PROVIDER_SITE_OTHER): Payer: Medicare Other | Admitting: Internal Medicine

## 2012-05-09 ENCOUNTER — Other Ambulatory Visit (INDEPENDENT_AMBULATORY_CARE_PROVIDER_SITE_OTHER): Payer: Medicare Other

## 2012-05-09 VITALS — BP 102/66 | HR 80 | Temp 97.8°F | Resp 16 | Wt 188.0 lb

## 2012-05-09 DIAGNOSIS — I10 Essential (primary) hypertension: Secondary | ICD-10-CM

## 2012-05-09 LAB — BASIC METABOLIC PANEL
BUN: 13 mg/dL (ref 6–23)
CO2: 32 mEq/L (ref 19–32)
Chloride: 94 mEq/L — ABNORMAL LOW (ref 96–112)
Creatinine, Ser: 0.6 mg/dL (ref 0.4–1.2)
Potassium: 4.6 mEq/L (ref 3.5–5.1)

## 2012-05-09 NOTE — Progress Notes (Signed)
Patient ID: Kathleen Mejia, female   DOB: March 29, 1934, 78 y.o.   MRN: [id number]  Subjective:    Patient ID: Kathleen Mejia, female    DOB: 1933/12/31, 78 y.o.   MRN: [id number]  HPI  C/o vertigo with position change in bed The patient presents for a follow-up of  chronic hypertension, chronic dyslipidemia, elev glu She is in PT for R shoulder rot cuff injury - dislocated in Feb - better  BP Readings from Last 3 Encounters:  05/09/12 102/66  03/11/12 110/64  01/25/12 148/84     Review of Systems  Constitutional: Negative for chills, activity change, appetite change, fatigue and unexpected weight change.  HENT: Negative for congestion, mouth sores and sinus pressure.   Eyes: Negative for visual disturbance.  Respiratory: Negative for cough and chest tightness.   Gastrointestinal: Negative for nausea and abdominal pain.  Genitourinary: Negative for frequency, difficulty urinating and vaginal pain.  Musculoskeletal: Negative for back pain and gait problem.  Skin: Negative for pallor and rash.  Neurological: Negative for dizziness, tremors, weakness, numbness and headaches.  Psychiatric/Behavioral: Negative for confusion and sleep disturbance.   Wt Readings from Last 3 Encounters:  05/09/12 188 lb (85.276 kg)  03/11/12 190 lb (86.183 kg)  01/25/12 189 lb (85.73 kg)       Objective:   Physical Exam  Constitutional: She appears well-developed. No distress.       obese  HENT:  Head: Normocephalic.  Right Ear: External ear normal.  Left Ear: External ear normal.  Nose: Nose normal.  Mouth/Throat: Oropharynx is clear and moist.  Eyes: Conjunctivae normal are normal. Pupils are equal, round, and reactive to light. Right eye exhibits no discharge. Left eye exhibits no discharge.  Neck: Normal range of motion. Neck supple. No JVD present. No tracheal deviation present. No thyromegaly present.  Cardiovascular: Normal rate, regular rhythm and normal heart sounds.   Pulmonary/Chest: No  stridor. No respiratory distress. She has no wheezes.  Abdominal: Soft. Bowel sounds are normal. She exhibits no distension and no mass. There is no tenderness. There is no rebound and no guarding.  Musculoskeletal: She exhibits no edema and no tenderness.  Lymphadenopathy:    She has no cervical adenopathy.  Neurological: She is alert. She has normal reflexes. No cranial nerve deficit. She exhibits normal muscle tone. Coordination normal.  Skin: No rash noted. No erythema.  Psychiatric: She has a normal mood and affect. Her behavior is normal. Judgment and thought content normal.  R shoulder is less tender w/decr ROM H-P is (+) on R   Lab Results  Component Value Date   WBC 6.8 10/19/2011   HGB 14.2 10/19/2011   HCT 42.2 10/19/2011   PLT 228.0 10/19/2011   GLUCOSE 114* 01/25/2012   CHOL 192 03/10/2010   TRIG 124.0 03/10/2010   HDL 46.70 03/10/2010   LDLDIRECT 139.3 02/21/2008   LDLCALC 121* 03/10/2010   ALT 28 10/19/2011   AST 23 10/19/2011   NA 134* 01/25/2012   K 3.5 01/25/2012   CL 98 01/25/2012   CREATININE 0.6 01/25/2012   BUN 10 01/25/2012   CO2 30 01/25/2012   TSH 1.89 10/19/2011   HGBA1C 5.4 01/25/2012   MICROALBUR 0.4 06/25/2008         Assessment & Plan:

## 2012-05-13 ENCOUNTER — Encounter: Payer: Self-pay | Admitting: Internal Medicine

## 2012-07-08 ENCOUNTER — Other Ambulatory Visit: Payer: Self-pay | Admitting: Internal Medicine

## 2012-07-08 DIAGNOSIS — Z1231 Encounter for screening mammogram for malignant neoplasm of breast: Secondary | ICD-10-CM

## 2012-08-02 ENCOUNTER — Ambulatory Visit
Admission: RE | Admit: 2012-08-02 | Discharge: 2012-08-02 | Disposition: A | Discharge status: Home / Self Care | Payer: Medicare Other | Source: Ambulatory Visit | Attending: Internal Medicine | Admitting: Internal Medicine

## 2012-08-02 DIAGNOSIS — Z1231 Encounter for screening mammogram for malignant neoplasm of breast: Secondary | ICD-10-CM

## 2012-08-22 ENCOUNTER — Telehealth: Payer: Self-pay | Admitting: *Deleted

## 2012-08-22 NOTE — Telephone Encounter (Signed)
Noted. Watch BP. Keep rov Thx

## 2012-08-22 NOTE — Telephone Encounter (Signed)
Pt has stopped taking the Triamterene-HCTZ because of side effects of muscle cramps, eye pain and weakness. She wanted MD to be aware.

## 2012-08-23 NOTE — Telephone Encounter (Signed)
Pt informed of MD's advisement. 

## 2012-09-09 ENCOUNTER — Telehealth: Payer: Self-pay | Admitting: Internal Medicine

## 2012-09-09 ENCOUNTER — Ambulatory Visit (INDEPENDENT_AMBULATORY_CARE_PROVIDER_SITE_OTHER): Payer: Medicare Other | Admitting: Internal Medicine

## 2012-09-09 ENCOUNTER — Other Ambulatory Visit (INDEPENDENT_AMBULATORY_CARE_PROVIDER_SITE_OTHER): Payer: Medicare Other

## 2012-09-09 ENCOUNTER — Encounter: Payer: Self-pay | Admitting: Internal Medicine

## 2012-09-09 VITALS — BP 150/80 | HR 72 | Temp 97.6°F | Resp 16 | Wt 189.0 lb

## 2012-09-09 DIAGNOSIS — I10 Essential (primary) hypertension: Secondary | ICD-10-CM

## 2012-09-09 DIAGNOSIS — R0989 Other specified symptoms and signs involving the circulatory and respiratory systems: Secondary | ICD-10-CM

## 2012-09-09 DIAGNOSIS — R609 Edema, unspecified: Secondary | ICD-10-CM

## 2012-09-09 DIAGNOSIS — R0600 Dyspnea, unspecified: Secondary | ICD-10-CM

## 2012-09-09 DIAGNOSIS — J069 Acute upper respiratory infection, unspecified: Secondary | ICD-10-CM | POA: Insufficient documentation

## 2012-09-09 LAB — BASIC METABOLIC PANEL
BUN: 10 mg/dL (ref 6–23)
Chloride: 102 mEq/L (ref 96–112)
Creatinine, Ser: 0.6 mg/dL (ref 0.4–1.2)

## 2012-09-09 LAB — MAGNESIUM: Magnesium: 2 mg/dL (ref 1.5–2.5)

## 2012-09-09 MED ORDER — SPIRONOLACTONE-HCTZ 25-25 MG PO TABS: 1.0000 | ORAL_TABLET | Freq: Every day | ORAL | Status: DC

## 2012-09-09 MED ORDER — AZITHROMYCIN 250 MG PO TABS: ORAL_TABLET | ORAL | Status: DC

## 2012-09-09 NOTE — Assessment & Plan Note (Signed)
Hard to control D/c Maxzide due to cramps Added Spironolac/hctz  w/caution

## 2012-09-09 NOTE — Progress Notes (Signed)
   Subjective:    Patient ID: Kathleen Mejia, female    DOB: [birth year], 79 y.o.   MRN: [id number]  HPI  C/o cramps w/Maxzide at high dose - stoped (there was no cramps w/low dose HCTZ). C/o URI sx's x1-2 wks C/o occ palpitations. She has been drinking a lot of water... The patient presents for a follow-up of  chronic hypertension, chronic dyslipidemia, elev glu She is in PT for R shoulder rot cuff injury - dislocated in Feb - better  BP Readings from Last 3 Encounters:  09/09/12 150/80  05/09/12 102/66  03/11/12 110/64     Review of Systems  Constitutional: Negative for chills, activity change, appetite change, fatigue and unexpected weight change.  HENT: Negative for congestion, mouth sores and sinus pressure.   Eyes: Negative for visual disturbance.  Respiratory: Negative for cough and chest tightness.   Gastrointestinal: Negative for nausea and abdominal pain.  Genitourinary: Negative for frequency, difficulty urinating and vaginal pain.  Musculoskeletal: Negative for back pain and gait problem.  Skin: Negative for pallor and rash.  Neurological: Negative for dizziness, tremors, weakness, numbness and headaches.  Psychiatric/Behavioral: Negative for confusion and sleep disturbance.   Wt Readings from Last 3 Encounters:  09/09/12 189 lb (85.73 kg)  05/09/12 188 lb (85.276 kg)  03/11/12 190 lb (86.183 kg)       Objective:   Physical Exam  Constitutional: She appears well-developed. No distress.  obese  HENT:  Head: Normocephalic.  Right Ear: External ear normal.  Left Ear: External ear normal.  Nose: Nose normal.  Mouth/Throat: Oropharynx is clear and moist.  Eyes: Conjunctivae are normal. Pupils are equal, round, and reactive to light. Right eye exhibits no discharge. Left eye exhibits no discharge.  Neck: Normal range of motion. Neck supple. No JVD present. No tracheal deviation present. No thyromegaly present.  Cardiovascular: Normal rate, regular rhythm and normal  heart sounds.   Pulmonary/Chest: No stridor. No respiratory distress. She has no wheezes.  Abdominal: Soft. Bowel sounds are normal. She exhibits no distension and no mass. There is no tenderness. There is no rebound and no guarding.  Musculoskeletal: She exhibits edema (trace B). She exhibits no tenderness.  Lymphadenopathy:    She has no cervical adenopathy.  Neurological: She is alert. She has normal reflexes. No cranial nerve deficit. She exhibits normal muscle tone. Coordination normal.  Skin: No rash noted. No erythema.  Hyperpigmented skin over ankles  Psychiatric: She has a normal mood and affect. Her behavior is normal. Judgment and thought content normal.  R shoulder is less tender w/decr ROM    Lab Results  Component Value Date   WBC 6.8 10/19/2011   HGB 14.2 10/19/2011   HCT 42.2 10/19/2011   PLT 228.0 10/19/2011   GLUCOSE 127* 05/09/2012   CHOL 192 03/10/2010   TRIG 124.0 03/10/2010   HDL 46.70 03/10/2010   LDLDIRECT 139.3 02/21/2008   LDLCALC 121* 03/10/2010   ALT 28 10/19/2011   AST 23 10/19/2011   NA 134* 05/09/2012   K 4.6 05/09/2012   CL 94* 05/09/2012   CREATININE 0.6 05/09/2012   BUN 13 05/09/2012   CO2 32 05/09/2012   TSH 1.89 10/19/2011   HGBA1C 5.4 01/25/2012   MICROALBUR 0.4 06/25/2008         Assessment & Plan:

## 2012-09-09 NOTE — Telephone Encounter (Signed)
Erline Levine, please, inform patient that all labs are normal except for a little elev glucose. Plan: as we discussed Thx

## 2012-09-09 NOTE — Telephone Encounter (Signed)
Pt informed

## 2012-09-09 NOTE — Assessment & Plan Note (Signed)
Zpac 

## 2012-09-09 NOTE — Assessment & Plan Note (Signed)
8/13 w/chest pressure chronic - stress test was ok

## 2012-09-09 NOTE — Assessment & Plan Note (Signed)
Chronic ven isufficiency Unable to use compr socks Needs to loose wt

## 2012-09-10 ENCOUNTER — Telehealth: Payer: Self-pay | Admitting: Internal Medicine

## 2012-09-10 NOTE — Telephone Encounter (Signed)
Kathleen Mejia, please, inform patient that all labs are normal except for elev glu. Pls loose wt Thx

## 2012-09-11 NOTE — Telephone Encounter (Signed)
Left mess for patient to call back.  

## 2012-09-12 NOTE — Telephone Encounter (Signed)
Pt informed

## 2012-10-26 ENCOUNTER — Other Ambulatory Visit: Payer: Self-pay | Admitting: Internal Medicine

## 2012-12-11 ENCOUNTER — Encounter: Payer: Self-pay | Admitting: Internal Medicine

## 2012-12-11 ENCOUNTER — Ambulatory Visit (INDEPENDENT_AMBULATORY_CARE_PROVIDER_SITE_OTHER): Payer: Medicare Other | Admitting: Internal Medicine

## 2012-12-11 ENCOUNTER — Other Ambulatory Visit (INDEPENDENT_AMBULATORY_CARE_PROVIDER_SITE_OTHER): Payer: Medicare Other

## 2012-12-11 VITALS — BP 122/70 | HR 76 | Temp 97.7°F | Resp 16 | Wt 190.0 lb

## 2012-12-11 DIAGNOSIS — I10 Essential (primary) hypertension: Secondary | ICD-10-CM

## 2012-12-11 DIAGNOSIS — R609 Edema, unspecified: Secondary | ICD-10-CM

## 2012-12-11 DIAGNOSIS — E669 Obesity, unspecified: Secondary | ICD-10-CM

## 2012-12-11 DIAGNOSIS — R7309 Other abnormal glucose: Secondary | ICD-10-CM

## 2012-12-11 LAB — BASIC METABOLIC PANEL
BUN: 13 mg/dL (ref 6–23)
CO2: 28 mEq/L (ref 19–32)
Chloride: 100 mEq/L (ref 96–112)
Creatinine, Ser: 0.5 mg/dL (ref 0.4–1.2)

## 2012-12-11 NOTE — Assessment & Plan Note (Signed)
Labs Wt loss 

## 2012-12-11 NOTE — Progress Notes (Signed)
Patient ID: Kathleen Mejia, female   DOB: [birth year], 79 y.o.   MRN: [id number]   Subjective:    Patient ID: Kathleen Mejia, female    DOB: [birth year], 79 y.o.   MRN: [id number]  HPI  C/o cramps w/Maxzide at high dose - stoped (there was no cramps w/low dose HCTZ). C/o URI sx's x1-2 wks C/o occ palpitations. She has been drinking a lot of water... The patient presents for a follow-up of  chronic hypertension, chronic dyslipidemia, elev glu She is in PT for R shoulder rot cuff injury - dislocated in Feb - better  BP Readings from Last 3 Encounters:  12/11/12 122/70  09/09/12 150/80  05/09/12 102/66     Review of Systems  Constitutional: Negative for chills, activity change, appetite change, fatigue and unexpected weight change.  HENT: Negative for congestion, mouth sores and sinus pressure.   Eyes: Negative for visual disturbance.  Respiratory: Negative for cough and chest tightness.   Gastrointestinal: Negative for nausea and abdominal pain.  Genitourinary: Negative for frequency, difficulty urinating and vaginal pain.  Musculoskeletal: Negative for back pain and gait problem.  Skin: Negative for pallor and rash.  Neurological: Negative for dizziness, tremors, weakness, numbness and headaches.  Psychiatric/Behavioral: Negative for confusion and sleep disturbance.   Wt Readings from Last 3 Encounters:  12/11/12 190 lb (86.183 kg)  09/09/12 189 lb (85.73 kg)  05/09/12 188 lb (85.276 kg)       Objective:   Physical Exam  Constitutional: She appears well-developed. No distress.  obese  HENT:  Head: Normocephalic.  Right Ear: External ear normal.  Left Ear: External ear normal.  Nose: Nose normal.  Mouth/Throat: Oropharynx is clear and moist.  Eyes: Conjunctivae are normal. Pupils are equal, round, and reactive to light. Right eye exhibits no discharge. Left eye exhibits no discharge.  Neck: Normal range of motion. Neck supple. No JVD present. No tracheal deviation present. No  thyromegaly present.  Cardiovascular: Normal rate, regular rhythm and normal heart sounds.   Pulmonary/Chest: No stridor. No respiratory distress. She has no wheezes.  Abdominal: Soft. Bowel sounds are normal. She exhibits no distension and no mass. There is no tenderness. There is no rebound and no guarding.  Musculoskeletal: She exhibits edema (trace B). She exhibits no tenderness.  Lymphadenopathy:    She has no cervical adenopathy.  Neurological: She is alert. She has normal reflexes. No cranial nerve deficit. She exhibits normal muscle tone. Coordination normal.  Skin: No rash noted. No erythema.  Hyperpigmented skin over ankles  Psychiatric: She has a normal mood and affect. Her behavior is normal. Judgment and thought content normal.  R shoulder is less tender w/decr ROM    Lab Results  Component Value Date   WBC 6.8 10/19/2011   HGB 14.2 10/19/2011   HCT 42.2 10/19/2011   PLT 228.0 10/19/2011   GLUCOSE 116* 09/09/2012   CHOL 192 03/10/2010   TRIG 124.0 03/10/2010   HDL 46.70 03/10/2010   LDLDIRECT 139.3 02/21/2008   LDLCALC 121* 03/10/2010   ALT 28 10/19/2011   AST 23 10/19/2011   NA 139 09/09/2012   K 4.0 09/09/2012   CL 102 09/09/2012   CREATININE 0.6 09/09/2012   BUN 10 09/09/2012   CO2 29 09/09/2012   TSH 1.77 09/09/2012   HGBA1C 5.4 01/25/2012   MICROALBUR 0.4 06/25/2008         Assessment & Plan:

## 2012-12-11 NOTE — Assessment & Plan Note (Signed)
Better  

## 2012-12-11 NOTE — Assessment & Plan Note (Addendum)
Wt Readings from Last 3 Encounters:  12/11/12 190 lb (86.183 kg)  09/09/12 189 lb (85.73 kg)  05/09/12 188 lb (85.276 kg)  Diet discussed Nutr ref

## 2013-02-05 ENCOUNTER — Encounter: Payer: Self-pay | Admitting: Internal Medicine

## 2013-04-16 ENCOUNTER — Other Ambulatory Visit (INDEPENDENT_AMBULATORY_CARE_PROVIDER_SITE_OTHER): Payer: Medicare Other

## 2013-04-16 ENCOUNTER — Ambulatory Visit (INDEPENDENT_AMBULATORY_CARE_PROVIDER_SITE_OTHER): Payer: Medicare Other | Admitting: Internal Medicine

## 2013-04-16 ENCOUNTER — Encounter: Payer: Self-pay | Admitting: Internal Medicine

## 2013-04-16 VITALS — BP 130/70 | HR 68 | Temp 98.0°F | Resp 16 | Wt 186.0 lb

## 2013-04-16 DIAGNOSIS — R0989 Other specified symptoms and signs involving the circulatory and respiratory systems: Secondary | ICD-10-CM

## 2013-04-16 DIAGNOSIS — I10 Essential (primary) hypertension: Secondary | ICD-10-CM

## 2013-04-16 DIAGNOSIS — R609 Edema, unspecified: Secondary | ICD-10-CM

## 2013-04-16 DIAGNOSIS — M899 Disorder of bone, unspecified: Secondary | ICD-10-CM

## 2013-04-16 DIAGNOSIS — R0609 Other forms of dyspnea: Secondary | ICD-10-CM

## 2013-04-16 DIAGNOSIS — M8580 Other specified disorders of bone density and structure, unspecified site: Secondary | ICD-10-CM

## 2013-04-16 DIAGNOSIS — R0600 Dyspnea, unspecified: Secondary | ICD-10-CM

## 2013-04-16 DIAGNOSIS — E669 Obesity, unspecified: Secondary | ICD-10-CM

## 2013-04-16 LAB — BASIC METABOLIC PANEL
BUN: 9 mg/dL (ref 6–23)
Calcium: 9.3 mg/dL (ref 8.4–10.5)
Chloride: 98 mEq/L (ref 96–112)
GFR: 104.82 mL/min (ref >60.00)
Glucose, Bld: 119 mg/dL — ABNORMAL HIGH (ref 70–99)
Potassium: 3.7 mEq/L (ref 3.5–5.1)

## 2013-04-16 LAB — HEMOGLOBIN A1C: Hgb A1c MFr Bld: 5.8 % (ref 4.6–6.5)

## 2013-04-16 NOTE — Assessment & Plan Note (Signed)
Chronic. 

## 2013-04-16 NOTE — Assessment & Plan Note (Signed)
trace edema now

## 2013-04-16 NOTE — Progress Notes (Signed)
Patient ID: Kathleen Mejia, female   DOB: 05-11-1934, 79 y.o.  Subjective:    HPI  F/u cramps w/Maxzide at high dose - stoped (there was no cramps w/low dose HCTZ). F/u occ palpitations. She has been drinking a lot of water... The patient presents for a follow-up of  chronic hypertension, chronic dyslipidemia, elev glu She is in PT for R shoulder rot cuff injury - dislocated in Feb - some better  BP Readings from Last 3 Encounters:  04/16/13 130/70  12/11/12 122/70  09/09/12 150/80     Review of Systems  Constitutional: Negative for chills, activity change, appetite change, fatigue and unexpected weight change.  HENT: Negative for congestion, mouth sores and sinus pressure.   Eyes: Negative for visual disturbance.  Respiratory: Negative for cough and chest tightness.   Gastrointestinal: Negative for nausea and abdominal pain.  Genitourinary: Negative for frequency, difficulty urinating and vaginal pain.  Musculoskeletal: Negative for back pain and gait problem.  Skin: Negative for pallor and rash.  Neurological: Negative for dizziness, tremors, weakness, numbness and headaches.  Psychiatric/Behavioral: Negative for confusion and sleep disturbance.   Wt Readings from Last 3 Encounters:  04/16/13 186 lb (84.369 kg)  12/11/12 190 lb (86.183 kg)  09/09/12 189 lb (85.73 kg)       Objective:   Physical Exam  Constitutional: She appears well-developed. No distress.  obese  HENT:  Head: Normocephalic.  Right Ear: External ear normal.  Left Ear: External ear normal.  Nose: Nose normal.  Mouth/Throat: Oropharynx is clear and moist.  Eyes: Conjunctivae are normal. Pupils are equal, round, and reactive to light. Right eye exhibits no discharge. Left eye exhibits no discharge.  Neck: Normal range of motion. Neck supple. No JVD present. No tracheal deviation present. No thyromegaly present.  Cardiovascular: Normal rate, regular rhythm and normal heart sounds.   Pulmonary/Chest: No  stridor. No respiratory distress. She has no wheezes.  Abdominal: Soft. Bowel sounds are normal. She exhibits no distension and no mass. There is no tenderness. There is no rebound and no guarding.  Musculoskeletal: She exhibits edema (trace B). She exhibits no tenderness.  Lymphadenopathy:    She has no cervical adenopathy.  Neurological: She is alert. She has normal reflexes. No cranial nerve deficit. She exhibits normal muscle tone. Coordination normal.  Skin: No rash noted. No erythema.  Hyperpigmented skin over ankles  Psychiatric: She has a normal mood and affect. Her behavior is normal. Judgment and thought content normal.  R shoulder is less tender w/decr ROM    Lab Results  Component Value Date   WBC 6.8 10/19/2011   HGB 14.2 10/19/2011   HCT 42.2 10/19/2011   PLT 228.0 10/19/2011   GLUCOSE 114* 12/11/2012   CHOL 192 03/10/2010   TRIG 124.0 03/10/2010   HDL 46.70 03/10/2010   LDLDIRECT 139.3 02/21/2008   LDLCALC 121* 03/10/2010   ALT 28 10/19/2011   AST 23 10/19/2011   NA 138 12/11/2012   K 4.0 12/11/2012   CL 100 12/11/2012   CREATININE 0.5 12/11/2012   BUN 13 12/11/2012   CO2 28 12/11/2012   TSH 1.77 09/09/2012   HGBA1C 5.9 12/11/2012   MICROALBUR 0.4 06/25/2008         Assessment & Plan:

## 2013-04-16 NOTE — Assessment & Plan Note (Signed)
A little better  Wt Readings from Last 3 Encounters:  04/16/13 186 lb (84.369 kg)  12/11/12 190 lb (86.183 kg)  09/09/12 189 lb (85.73 kg)

## 2013-04-16 NOTE — Assessment & Plan Note (Signed)
Continue with current prescription therapy as reflected on the Med list.  

## 2013-05-23 ENCOUNTER — Ambulatory Visit (INDEPENDENT_AMBULATORY_CARE_PROVIDER_SITE_OTHER): Payer: Medicare Other | Admitting: Internal Medicine

## 2013-05-23 ENCOUNTER — Encounter: Payer: Self-pay | Admitting: Internal Medicine

## 2013-05-23 VITALS — BP 130/72 | HR 80 | Temp 96.9°F | Resp 16 | Wt 190.0 lb

## 2013-05-23 DIAGNOSIS — I10 Essential (primary) hypertension: Secondary | ICD-10-CM

## 2013-05-23 MED ORDER — TELMISARTAN 80 MG PO TABS: 80.0000 mg | ORAL_TABLET | Freq: Every day | ORAL | Status: DC

## 2013-05-23 NOTE — Progress Notes (Signed)
Pre visit review using our clinic review tool, if applicable. No additional management support is needed unless otherwise documented below in the visit note. 

## 2013-05-23 NOTE — Progress Notes (Signed)
   Subjective:    HPI  C/o Ins co wants her to switch from Micardis to Telmisartan  F/u cramps w/Maxzide at high dose - stoped (there was no cramps w/low dose HCTZ). F/u occ palpitations. She has been drinking a lot of water... The patient presents for a follow-up of  chronic hypertension, chronic dyslipidemia, elev glu She is in PT for R shoulder rot cuff injury - dislocated in Feb - some better     Review of Systems  Constitutional: Negative for chills, activity change, appetite change, fatigue and unexpected weight change.  HENT: Negative for congestion, mouth sores and sinus pressure.   Eyes: Negative for visual disturbance.  Respiratory: Negative for cough and chest tightness.   Gastrointestinal: Negative for nausea and abdominal pain.  Genitourinary: Negative for frequency, difficulty urinating and vaginal pain.  Musculoskeletal: Negative for back pain and gait problem.  Skin: Negative for pallor and rash.  Neurological: Negative for dizziness, tremors, weakness, numbness and headaches.  Psychiatric/Behavioral: Negative for confusion and sleep disturbance.   Wt Readings from Last 3 Encounters:  05/23/13 190 lb (86.183 kg)  04/16/13 186 lb (84.369 kg)  12/11/12 190 lb (86.183 kg)   BP Readings from Last 3 Encounters:  05/23/13 130/72  04/16/13 130/70  12/11/12 122/70       Objective:   Physical Exam  Constitutional: She appears well-developed. No distress.  obese  HENT:  Head: Normocephalic.  Right Ear: External ear normal.  Left Ear: External ear normal.  Nose: Nose normal.  Mouth/Throat: Oropharynx is clear and moist.  Eyes: Conjunctivae are normal. Pupils are equal, round, and reactive to light. Right eye exhibits no discharge. Left eye exhibits no discharge.  Neck: Normal range of motion. Neck supple. No JVD present. No tracheal deviation present. No thyromegaly present.  Cardiovascular: Normal rate, regular rhythm and normal heart sounds.    Pulmonary/Chest: No stridor. No respiratory distress. She has no wheezes.  Abdominal: Soft. Bowel sounds are normal. She exhibits no distension and no mass. There is no tenderness. There is no rebound and no guarding.  Musculoskeletal: She exhibits edema (trace B). She exhibits no tenderness.  Lymphadenopathy:    She has no cervical adenopathy.  Neurological: She is alert. She has normal reflexes. No cranial nerve deficit. She exhibits normal muscle tone. Coordination normal.  Skin: No rash noted. No erythema.  Hyperpigmented skin over ankles  Psychiatric: She has a normal mood and affect. Her behavior is normal. Judgment and thought content normal.  R shoulder is less tender w/decr ROM    Lab Results  Component Value Date   WBC 6.8 10/19/2011   HGB 14.2 10/19/2011   HCT 42.2 10/19/2011   PLT 228.0 10/19/2011   GLUCOSE 119* 04/16/2013   CHOL 192 03/10/2010   TRIG 124.0 03/10/2010   HDL 46.70 03/10/2010   LDLDIRECT 139.3 02/21/2008   LDLCALC 121* 03/10/2010   ALT 28 10/19/2011   AST 23 10/19/2011   NA 134* 04/16/2013   K 3.7 04/16/2013   CL 98 04/16/2013   CREATININE 0.6 04/16/2013   BUN 9 04/16/2013   CO2 31 04/16/2013   TSH 1.77 09/09/2012   HGBA1C 5.8 04/16/2013   MICROALBUR 0.4 06/25/2008         Assessment & Plan:

## 2013-05-23 NOTE — Assessment & Plan Note (Signed)
Ins co wants her to switch from Micardis to Telmisartan Will try Telmisartan

## 2013-06-25 ENCOUNTER — Encounter: Payer: Self-pay | Admitting: Internal Medicine

## 2013-06-25 ENCOUNTER — Ambulatory Visit (INDEPENDENT_AMBULATORY_CARE_PROVIDER_SITE_OTHER)
Admission: RE | Admit: 2013-06-25 | Discharge: 2013-06-25 | Disposition: A | Discharge status: Home / Self Care | Payer: Medicare Other | Source: Ambulatory Visit | Attending: Internal Medicine | Admitting: Internal Medicine

## 2013-06-25 ENCOUNTER — Ambulatory Visit (INDEPENDENT_AMBULATORY_CARE_PROVIDER_SITE_OTHER): Payer: Medicare Other | Admitting: Internal Medicine

## 2013-06-25 ENCOUNTER — Other Ambulatory Visit (INDEPENDENT_AMBULATORY_CARE_PROVIDER_SITE_OTHER): Payer: Medicare Other

## 2013-06-25 VITALS — BP 122/66 | HR 72 | Temp 97.5°F | Resp 16 | Wt 188.0 lb

## 2013-06-25 DIAGNOSIS — M949 Disorder of cartilage, unspecified: Secondary | ICD-10-CM

## 2013-06-25 DIAGNOSIS — M545 Low back pain, unspecified: Secondary | ICD-10-CM

## 2013-06-25 DIAGNOSIS — M899 Disorder of bone, unspecified: Secondary | ICD-10-CM

## 2013-06-25 DIAGNOSIS — I10 Essential (primary) hypertension: Secondary | ICD-10-CM

## 2013-06-25 DIAGNOSIS — M8580 Other specified disorders of bone density and structure, unspecified site: Secondary | ICD-10-CM

## 2013-06-25 DIAGNOSIS — R209 Unspecified disturbances of skin sensation: Secondary | ICD-10-CM

## 2013-06-25 DIAGNOSIS — R202 Paresthesia of skin: Secondary | ICD-10-CM

## 2013-06-25 DIAGNOSIS — R7309 Other abnormal glucose: Secondary | ICD-10-CM

## 2013-06-25 LAB — BASIC METABOLIC PANEL
BUN: 15 mg/dL (ref 6–23)
CO2: 28 mEq/L (ref 19–32)
CREATININE: 0.6 mg/dL (ref 0.4–1.2)
Calcium: 9.6 mg/dL (ref 8.4–10.5)
Chloride: 100 mEq/L (ref 96–112)
GFR: 104.77 mL/min (ref >60.00)
GLUCOSE: 102 mg/dL — AB (ref 70–99)
Potassium: 3.9 mEq/L (ref 3.5–5.1)
Sodium: 136 mEq/L (ref 135–145)

## 2013-06-25 LAB — MAGNESIUM: Magnesium: 2.1 mg/dL (ref 1.5–2.5)

## 2013-06-25 LAB — TSH: TSH: 1.4 u[IU]/mL (ref 0.35–5.50)

## 2013-06-25 LAB — HEMOGLOBIN A1C: Hgb A1c MFr Bld: 5.6 % (ref 4.6–6.5)

## 2013-06-25 LAB — VITAMIN B12: VITAMIN B 12: 485 pg/mL (ref 211–911)

## 2013-06-25 MED ORDER — SPIRONOLACTONE-HCTZ 25-25 MG PO TABS: 0.5000 | ORAL_TABLET | Freq: Every day | ORAL | Status: DC

## 2013-06-25 MED ORDER — SPIRONOLACTONE-HCTZ 25-25 MG PO TABS
0.5000 | ORAL_TABLET | Freq: Every day | ORAL | Status: DC
Start: 2013-06-25 — End: 2014-04-28

## 2013-06-25 MED ORDER — TELMISARTAN 40 MG PO TABS: 40.0000 mg | ORAL_TABLET | Freq: Every day | ORAL | Status: DC

## 2013-06-25 NOTE — Assessment & Plan Note (Signed)
Labs See med change

## 2013-06-25 NOTE — Assessment & Plan Note (Signed)
OK to take Telmisartan 1/2 tab a day if tingling continues (40 mg) Aldactozide 1/2 tab a day NAS

## 2013-06-25 NOTE — Patient Instructions (Addendum)
OK to take Telmisartan 1/2 tab a day if tingling continues (40 mg) Aldactozide 1/2 tab a day

## 2013-06-25 NOTE — Progress Notes (Signed)
Pre visit review using our clinic review tool, if applicable. No additional management support is needed unless otherwise documented below in the visit note. 

## 2013-06-25 NOTE — Progress Notes (Signed)
   Subjective:    HPI  F/u Ins co wants her to switch from Micardis to Telmisartan. We switched. C/o "pins and needles, being dry"...  F/u cramps w/Maxzide at high dose - stoped (there was no cramps w/low dose HCTZ). F/u occ palpitations. She has been drinking a lot of water... The patient presents for a follow-up of  chronic hypertension, chronic dyslipidemia, elev glu She is in PT for R shoulder rot cuff injury - dislocated in Feb - some better     Review of Systems  Constitutional: Negative for chills, activity change, appetite change, fatigue and unexpected weight change.  HENT: Negative for congestion, mouth sores and sinus pressure.   Eyes: Negative for visual disturbance.  Respiratory: Negative for cough and chest tightness.   Gastrointestinal: Negative for nausea and abdominal pain.  Genitourinary: Negative for frequency, difficulty urinating and vaginal pain.  Musculoskeletal: Negative for back pain and gait problem.  Skin: Negative for pallor and rash.  Neurological: Negative for dizziness, tremors, weakness, numbness and headaches.  Psychiatric/Behavioral: Negative for confusion and sleep disturbance.   Wt Readings from Last 3 Encounters:  06/25/13 188 lb (85.276 kg)  05/23/13 190 lb (86.183 kg)  04/16/13 186 lb (84.369 kg)   BP Readings from Last 3 Encounters:  06/25/13 122/66  05/23/13 130/72  04/16/13 130/70       Objective:   Physical Exam  Constitutional: She appears well-developed. No distress.  obese  HENT:  Head: Normocephalic.  Right Ear: External ear normal.  Left Ear: External ear normal.  Nose: Nose normal.  Mouth/Throat: Oropharynx is clear and moist.  Eyes: Conjunctivae are normal. Pupils are equal, round, and reactive to light. Right eye exhibits no discharge. Left eye exhibits no discharge.  Neck: Normal range of motion. Neck supple. No JVD present. No tracheal deviation present. No thyromegaly present.  Cardiovascular: Normal rate,  regular rhythm and normal heart sounds.   Pulmonary/Chest: No stridor. No respiratory distress. She has no wheezes.  Abdominal: Soft. Bowel sounds are normal. She exhibits no distension and no mass. There is no tenderness. There is no rebound and no guarding.  Musculoskeletal: She exhibits edema (trace B). She exhibits no tenderness.  Lymphadenopathy:    She has no cervical adenopathy.  Neurological: She is alert. She has normal reflexes. No cranial nerve deficit. She exhibits normal muscle tone. Coordination normal.  Skin: No rash noted. No erythema.  Hyperpigmented skin over ankles  Psychiatric: She has a normal mood and affect. Her behavior is normal. Judgment and thought content normal.  R shoulder is less tender w/decr ROM    Lab Results  Component Value Date   WBC 6.8 10/19/2011   HGB 14.2 10/19/2011   HCT 42.2 10/19/2011   PLT 228.0 10/19/2011   GLUCOSE 119* 04/16/2013   CHOL 192 03/10/2010   TRIG 124.0 03/10/2010   HDL 46.70 03/10/2010   LDLDIRECT 139.3 02/21/2008   LDLCALC 121* 03/10/2010   ALT 28 10/19/2011   AST 23 10/19/2011   NA 134* 04/16/2013   K 3.7 04/16/2013   CL 98 04/16/2013   CREATININE 0.6 04/16/2013   BUN 9 04/16/2013   CO2 31 04/16/2013   TSH 1.77 09/09/2012   HGBA1C 5.8 04/16/2013   MICROALBUR 0.4 06/25/2008         Assessment & Plan:

## 2013-06-26 NOTE — Assessment & Plan Note (Signed)
Wt loss suggested

## 2013-06-26 NOTE — Assessment & Plan Note (Signed)
Continue with current prescription therapy as reflected on the Med list.  

## 2013-07-09 ENCOUNTER — Telehealth: Payer: Self-pay | Admitting: Internal Medicine

## 2013-07-09 ENCOUNTER — Other Ambulatory Visit: Payer: Self-pay

## 2013-07-09 DIAGNOSIS — Z1231 Encounter for screening mammogram for malignant neoplasm of breast: Secondary | ICD-10-CM

## 2013-07-09 DIAGNOSIS — Z803 Family history of malignant neoplasm of breast: Secondary | ICD-10-CM

## 2013-07-09 NOTE — Telephone Encounter (Signed)
Patient is calling back concerning her bone density results. Please advise.

## 2013-07-10 NOTE — Telephone Encounter (Signed)
I called pt- I informed of bone density results.

## 2013-08-04 ENCOUNTER — Ambulatory Visit
Admission: RE | Admit: 2013-08-04 | Discharge: 2013-08-04 | Disposition: A | Discharge status: Home / Self Care | Payer: Medicare Other | Source: Ambulatory Visit

## 2013-08-04 DIAGNOSIS — Z803 Family history of malignant neoplasm of breast: Secondary | ICD-10-CM

## 2013-08-04 DIAGNOSIS — Z1231 Encounter for screening mammogram for malignant neoplasm of breast: Secondary | ICD-10-CM

## 2013-08-14 ENCOUNTER — Ambulatory Visit: Payer: Medicare Other | Admitting: Internal Medicine

## 2013-08-18 ENCOUNTER — Ambulatory Visit (INDEPENDENT_AMBULATORY_CARE_PROVIDER_SITE_OTHER): Payer: Medicare Other | Admitting: Internal Medicine

## 2013-08-18 ENCOUNTER — Encounter: Payer: Self-pay | Admitting: Internal Medicine

## 2013-08-18 VITALS — BP 136/80 | HR 80 | Temp 97.3°F | Resp 16 | Wt 192.0 lb

## 2013-08-18 DIAGNOSIS — J069 Acute upper respiratory infection, unspecified: Secondary | ICD-10-CM

## 2013-08-18 DIAGNOSIS — R202 Paresthesia of skin: Secondary | ICD-10-CM

## 2013-08-18 DIAGNOSIS — I10 Essential (primary) hypertension: Secondary | ICD-10-CM

## 2013-08-18 DIAGNOSIS — E785 Hyperlipidemia, unspecified: Secondary | ICD-10-CM

## 2013-08-18 DIAGNOSIS — R209 Unspecified disturbances of skin sensation: Secondary | ICD-10-CM

## 2013-08-18 MED ORDER — BENZONATATE 100 MG PO CAPS: 100.0000 mg | ORAL_CAPSULE | Freq: Three times a day (TID) | ORAL | Status: DC | PRN

## 2013-08-18 MED ORDER — PROMETHAZINE-CODEINE 6.25-10 MG/5ML PO SYRP: 5.0000 mL | ORAL_SOLUTION | ORAL | Status: DC | PRN

## 2013-08-18 NOTE — Assessment & Plan Note (Signed)
Resolved on 1/2 tab aldactozide

## 2013-08-18 NOTE — Assessment & Plan Note (Signed)
  On diet  

## 2013-08-18 NOTE — Assessment & Plan Note (Signed)
Better BP Readings from Last 3 Encounters:  08/18/13 136/80  06/25/13 122/66  05/23/13 130/72

## 2013-08-18 NOTE — Assessment & Plan Note (Signed)
Prom-cod syr 

## 2013-08-18 NOTE — Progress Notes (Signed)
   Subjective:    HPI  C/o URI sx's  4-5 d F/u Ins co wants her to switch from Micardis to Telmisartan. We switched. F/u "pins and needles, being dry"...better  F/u cramps w/Maxzide at high dose - stoped (there was no cramps w/low dose HCTZ). F/u occ palpitations. She has been drinking a lot of water... The patient presents for a follow-up of  chronic hypertension, chronic dyslipidemia, elev glu She is in PT for R shoulder rot cuff injury - dislocated in Feb - some better     Review of Systems  Constitutional: Negative for chills, activity change, appetite change, fatigue and unexpected weight change.  HENT: Negative for congestion, mouth sores and sinus pressure.   Eyes: Negative for visual disturbance.  Respiratory: Negative for cough and chest tightness.   Gastrointestinal: Negative for nausea and abdominal pain.  Genitourinary: Negative for frequency, difficulty urinating and vaginal pain.  Musculoskeletal: Negative for back pain and gait problem.  Skin: Negative for pallor and rash.  Neurological: Negative for dizziness, tremors, weakness, numbness and headaches.  Psychiatric/Behavioral: Negative for confusion and sleep disturbance.   Wt Readings from Last 3 Encounters:  08/18/13 192 lb (87.091 kg)  06/25/13 188 lb (85.276 kg)  05/23/13 190 lb (86.183 kg)   BP Readings from Last 3 Encounters:  08/18/13 136/80  06/25/13 122/66  05/23/13 130/72       Objective:   Physical Exam  Constitutional: She appears well-developed. No distress.  obese  HENT:  Head: Normocephalic.  Right Ear: External ear normal.  Left Ear: External ear normal.  Nose: Nose normal.  Mouth/Throat: Oropharynx is clear and moist.  Eyes: Conjunctivae are normal. Pupils are equal, round, and reactive to light. Right eye exhibits no discharge. Left eye exhibits no discharge.  Neck: Normal range of motion. Neck supple. No JVD present. No tracheal deviation present. No thyromegaly present.   Cardiovascular: Normal rate, regular rhythm and normal heart sounds.   Pulmonary/Chest: No stridor. No respiratory distress. She has no wheezes.  Abdominal: Soft. Bowel sounds are normal. She exhibits no distension and no mass. There is no tenderness. There is no rebound and no guarding.  Musculoskeletal: She exhibits edema (trace B). She exhibits no tenderness.  Lymphadenopathy:    She has no cervical adenopathy.  Neurological: She is alert. She has normal reflexes. No cranial nerve deficit. She exhibits normal muscle tone. Coordination normal.  Skin: No rash noted. No erythema.  Hyperpigmented skin over ankles  Psychiatric: She has a normal mood and affect. Her behavior is normal. Judgment and thought content normal.  R shoulder is less tender w/decr ROM    Lab Results  Component Value Date   WBC 6.8 10/19/2011   HGB 14.2 10/19/2011   HCT 42.2 10/19/2011   PLT 228.0 10/19/2011   GLUCOSE 102* 06/25/2013   CHOL 192 03/10/2010   TRIG 124.0 03/10/2010   HDL 46.70 03/10/2010   LDLDIRECT 139.3 02/21/2008   LDLCALC 121* 03/10/2010   ALT 28 10/19/2011   AST 23 10/19/2011   NA 136 06/25/2013   K 3.9 06/25/2013   CL 100 06/25/2013   CREATININE 0.6 06/25/2013   BUN 15 06/25/2013   CO2 28 06/25/2013   TSH 1.40 06/25/2013   HGBA1C 5.6 06/25/2013   MICROALBUR 0.4 06/25/2008         Assessment & Plan:

## 2013-08-18 NOTE — Progress Notes (Signed)
Pre visit review using our clinic review tool, if applicable. No additional management support is needed unless otherwise documented below in the visit note. 

## 2013-12-18 ENCOUNTER — Encounter: Payer: Self-pay | Admitting: Internal Medicine

## 2013-12-18 ENCOUNTER — Ambulatory Visit (INDEPENDENT_AMBULATORY_CARE_PROVIDER_SITE_OTHER)
Admission: RE | Admit: 2013-12-18 | Discharge: 2013-12-18 | Disposition: A | Discharge status: Home / Self Care | Payer: Medicare Other | Source: Ambulatory Visit | Attending: Internal Medicine | Admitting: Internal Medicine

## 2013-12-18 ENCOUNTER — Other Ambulatory Visit (INDEPENDENT_AMBULATORY_CARE_PROVIDER_SITE_OTHER): Payer: Medicare Other

## 2013-12-18 ENCOUNTER — Ambulatory Visit (INDEPENDENT_AMBULATORY_CARE_PROVIDER_SITE_OTHER): Payer: Medicare Other | Admitting: Internal Medicine

## 2013-12-18 VITALS — BP 110/82 | HR 80 | Temp 98.1°F | Resp 16 | Wt 187.0 lb

## 2013-12-18 DIAGNOSIS — M545 Low back pain, unspecified: Secondary | ICD-10-CM

## 2013-12-18 DIAGNOSIS — M25569 Pain in unspecified knee: Secondary | ICD-10-CM

## 2013-12-18 DIAGNOSIS — R0609 Other forms of dyspnea: Secondary | ICD-10-CM

## 2013-12-18 DIAGNOSIS — E785 Hyperlipidemia, unspecified: Secondary | ICD-10-CM

## 2013-12-18 DIAGNOSIS — R945 Abnormal results of liver function studies: Secondary | ICD-10-CM

## 2013-12-18 DIAGNOSIS — R0989 Other specified symptoms and signs involving the circulatory and respiratory systems: Secondary | ICD-10-CM

## 2013-12-18 DIAGNOSIS — N3090 Cystitis, unspecified without hematuria: Secondary | ICD-10-CM

## 2013-12-18 DIAGNOSIS — I10 Essential (primary) hypertension: Secondary | ICD-10-CM

## 2013-12-18 DIAGNOSIS — R0600 Dyspnea, unspecified: Secondary | ICD-10-CM

## 2013-12-18 LAB — URINALYSIS, ROUTINE W REFLEX MICROSCOPIC
Bilirubin Urine: NEGATIVE
Hgb urine dipstick: NEGATIVE
Nitrite: POSITIVE — AB
PH: 6 (ref 5.0–8.0)
Specific Gravity, Urine: 1.015 (ref 1.000–1.030)
Urine Glucose: NEGATIVE
Urobilinogen, UA: 0.2 (ref 0.0–1.0)

## 2013-12-18 LAB — HEPATIC FUNCTION PANEL
ALT: 27 U/L (ref 0–35)
AST: 22 U/L (ref 0–37)
Albumin: 4.1 g/dL (ref 3.5–5.2)
Alkaline Phosphatase: 67 U/L (ref 39–117)
BILIRUBIN TOTAL: 0.7 mg/dL (ref 0.2–1.2)
Bilirubin, Direct: 0.1 mg/dL (ref 0.0–0.3)
Total Protein: 7.4 g/dL (ref 6.0–8.3)

## 2013-12-18 LAB — BASIC METABOLIC PANEL
BUN: 12 mg/dL (ref 6–23)
CO2: 30 mEq/L (ref 19–32)
Calcium: 9.6 mg/dL (ref 8.4–10.5)
Chloride: 97 mEq/L (ref 96–112)
Creatinine, Ser: 0.6 mg/dL (ref 0.4–1.2)
GFR: 100.69 mL/min (ref >60.00)
Glucose, Bld: 123 mg/dL — ABNORMAL HIGH (ref 70–99)
POTASSIUM: 3.9 meq/L (ref 3.5–5.1)
Sodium: 135 mEq/L (ref 135–145)

## 2013-12-18 NOTE — Progress Notes (Signed)
   Subjective:    HPI  C/o R knee pain >L x long time F/u Ins co wants her to switch from Micardis to Telmisartan. We switched. F/u "pins and needles, being dry"...better  F/u cramps w/Maxzide at high dose - stoped (there was no cramps w/low dose HCTZ). F/u occ palpitations. She has been drinking a lot of water... The patient presents for a follow-up of  chronic hypertension, chronic dyslipidemia, elev glu She is in PT for R shoulder rot cuff injury - dislocated in Feb - some better     Review of Systems  Constitutional: Negative for chills, activity change, appetite change, fatigue and unexpected weight change.  HENT: Negative for congestion, mouth sores and sinus pressure.   Eyes: Negative for visual disturbance.  Respiratory: Negative for cough and chest tightness.   Gastrointestinal: Negative for nausea and abdominal pain.  Genitourinary: Negative for frequency, difficulty urinating and vaginal pain.  Musculoskeletal: Negative for back pain and gait problem.  Skin: Negative for pallor and rash.  Neurological: Negative for dizziness, tremors, weakness, numbness and headaches.  Psychiatric/Behavioral: Negative for confusion and sleep disturbance.   Wt Readings from Last 3 Encounters:  12/18/13 187 lb (84.823 kg)  08/18/13 192 lb (87.091 kg)  06/25/13 188 lb (85.276 kg)   BP Readings from Last 3 Encounters:  12/18/13 110/82  08/18/13 136/80  06/25/13 122/66       Objective:   Physical Exam  Constitutional: She appears well-developed. No distress.  obese  HENT:  Head: Normocephalic.  Right Ear: External ear normal.  Left Ear: External ear normal.  Nose: Nose normal.  Mouth/Throat: Oropharynx is clear and moist.  Eyes: Conjunctivae are normal. Pupils are equal, round, and reactive to light. Right eye exhibits no discharge. Left eye exhibits no discharge.  Neck: Normal range of motion. Neck supple. No JVD present. No tracheal deviation present. No thyromegaly present.   Cardiovascular: Normal rate, regular rhythm and normal heart sounds.   Pulmonary/Chest: No stridor. No respiratory distress. She has no wheezes.  Abdominal: Soft. Bowel sounds are normal. She exhibits no distension and no mass. There is no tenderness. There is no rebound and no guarding.  Musculoskeletal: She exhibits edema (trace B). She exhibits no tenderness.  Lymphadenopathy:    She has no cervical adenopathy.  Neurological: She is alert. She has normal reflexes. No cranial nerve deficit. She exhibits normal muscle tone. Coordination normal.  Skin: No rash noted. No erythema.  Hyperpigmented skin over ankles  Psychiatric: She has a normal mood and affect. Her behavior is normal. Judgment and thought content normal.  R shoulder is less tender w/decr ROM R knee is tender    Lab Results  Component Value Date   WBC 6.8 10/19/2011   HGB 14.2 10/19/2011   HCT 42.2 10/19/2011   PLT 228.0 10/19/2011   GLUCOSE 102* 06/25/2013   CHOL 192 03/10/2010   TRIG 124.0 03/10/2010   HDL 46.70 03/10/2010   LDLDIRECT 139.3 02/21/2008   LDLCALC 121* 03/10/2010   ALT 28 10/19/2011   AST 23 10/19/2011   NA 136 06/25/2013   K 3.9 06/25/2013   CL 100 06/25/2013   CREATININE 0.6 06/25/2013   BUN 15 06/25/2013   CO2 28 06/25/2013   TSH 1.40 06/25/2013   HGBA1C 5.6 06/25/2013   MICROALBUR 0.4 06/25/2008         Assessment & Plan:

## 2013-12-18 NOTE — Assessment & Plan Note (Signed)
UA

## 2013-12-18 NOTE — Assessment & Plan Note (Signed)
X ray R knee

## 2013-12-18 NOTE — Assessment & Plan Note (Signed)
Continue with current prescription therapy as reflected on the Med list.  

## 2013-12-18 NOTE — Progress Notes (Signed)
Pre visit review using our clinic review tool, if applicable. No additional management support is needed unless otherwise documented below in the visit note. 

## 2013-12-18 NOTE — Assessment & Plan Note (Signed)
Cont w/wt loss 

## 2013-12-18 NOTE — Assessment & Plan Note (Signed)
Labs

## 2013-12-19 ENCOUNTER — Other Ambulatory Visit: Payer: Self-pay | Admitting: Internal Medicine

## 2013-12-19 MED ORDER — CIPROFLOXACIN HCL 250 MG PO TABS: 250.0000 mg | ORAL_TABLET | Freq: Every day | ORAL | Status: DC

## 2014-01-28 ENCOUNTER — Ambulatory Visit: Payer: Medicare Other | Admitting: Internal Medicine

## 2014-01-30 ENCOUNTER — Ambulatory Visit (INDEPENDENT_AMBULATORY_CARE_PROVIDER_SITE_OTHER): Payer: Medicare Other | Admitting: Internal Medicine

## 2014-01-30 ENCOUNTER — Other Ambulatory Visit (INDEPENDENT_AMBULATORY_CARE_PROVIDER_SITE_OTHER): Payer: Medicare Other

## 2014-01-30 ENCOUNTER — Encounter: Payer: Self-pay | Admitting: Internal Medicine

## 2014-01-30 VITALS — BP 152/78 | HR 100 | Temp 98.2°F | Resp 18 | Ht 61.0 in | Wt 189.0 lb

## 2014-01-30 DIAGNOSIS — I10 Essential (primary) hypertension: Secondary | ICD-10-CM

## 2014-01-30 DIAGNOSIS — R000 Tachycardia, unspecified: Secondary | ICD-10-CM | POA: Insufficient documentation

## 2014-01-30 DIAGNOSIS — K8020 Calculus of gallbladder without cholecystitis without obstruction: Secondary | ICD-10-CM

## 2014-01-30 DIAGNOSIS — R079 Chest pain, unspecified: Secondary | ICD-10-CM

## 2014-01-30 DIAGNOSIS — R1013 Epigastric pain: Secondary | ICD-10-CM

## 2014-01-30 DIAGNOSIS — E785 Hyperlipidemia, unspecified: Secondary | ICD-10-CM

## 2014-01-30 DIAGNOSIS — G8929 Other chronic pain: Secondary | ICD-10-CM | POA: Insufficient documentation

## 2014-01-30 LAB — CBC WITH DIFFERENTIAL/PLATELET
BASOS ABS: 0 10*3/uL (ref 0.0–0.1)
Basophils Relative: 0.4 % (ref 0.0–3.0)
Eosinophils Absolute: 0.1 10*3/uL (ref 0.0–0.7)
Eosinophils Relative: 1.4 % (ref 0.0–5.0)
HEMATOCRIT: 44.8 % (ref 36.0–46.0)
Hemoglobin: 15.5 g/dL — ABNORMAL HIGH (ref 12.0–15.0)
LYMPHS ABS: 2.1 10*3/uL (ref 0.7–4.0)
Lymphocytes Relative: 30.4 % (ref 12.0–46.0)
MCHC: 34.5 g/dL (ref 30.0–36.0)
MCV: 95.5 fl (ref 78.0–100.0)
Monocytes Absolute: 0.5 10*3/uL (ref 0.1–1.0)
Monocytes Relative: 8 % (ref 3.0–12.0)
Neutro Abs: 4 10*3/uL (ref 1.4–7.7)
Neutrophils Relative %: 59.8 % (ref 43.0–77.0)
PLATELETS: 225 10*3/uL (ref 150.0–400.0)
RBC: 4.69 Mil/uL (ref 3.87–5.11)
RDW: 12.9 % (ref 11.5–15.5)
WBC: 6.7 10*3/uL (ref 4.0–10.5)

## 2014-01-30 LAB — BASIC METABOLIC PANEL
BUN: 10 mg/dL (ref 6–23)
CHLORIDE: 99 meq/L (ref 96–112)
CO2: 29 mEq/L (ref 19–32)
Calcium: 9.5 mg/dL (ref 8.4–10.5)
Creatinine, Ser: 0.5 mg/dL (ref 0.4–1.2)
GFR: 115.86 mL/min (ref >60.00)
Glucose, Bld: 99 mg/dL (ref 70–99)
POTASSIUM: 4.1 meq/L (ref 3.5–5.1)
Sodium: 137 mEq/L (ref 135–145)

## 2014-01-30 LAB — LIPASE: Lipase: 36 U/L (ref 11.0–59.0)

## 2014-01-30 LAB — HEPATIC FUNCTION PANEL
ALT: 27 U/L (ref 0–35)
AST: 23 U/L (ref 0–37)
Albumin: 4 g/dL (ref 3.5–5.2)
Alkaline Phosphatase: 68 U/L (ref 39–117)
BILIRUBIN DIRECT: 0.1 mg/dL (ref 0.0–0.3)
Total Bilirubin: 0.6 mg/dL (ref 0.2–1.2)
Total Protein: 7.4 g/dL (ref 6.0–8.3)

## 2014-01-30 MED ORDER — ATENOLOL 25 MG PO TABS: 25.0000 mg | ORAL_TABLET | Freq: Every day | ORAL | Status: DC

## 2014-01-30 NOTE — Progress Notes (Signed)
Subjective:    Chest Pain  This is a new problem. The current episode started 1 to 4 weeks ago. The onset quality is sudden. The problem occurs intermittently (x2). The problem has been resolved. The pain is present in the lateral region (L). The pain is severe. The quality of the pain is described as pressure and crushing (hours). The pain radiates to the left shoulder. Pertinent negatives include no abdominal pain, back pain, cough, dizziness, headaches, nausea, numbness or weakness. The pain is aggravated by nothing. She has tried analgesics for the symptoms. The treatment provided mild relief.  She had gas B sisters have GSs   F/u Ins co wants her to switch from Micardis to Telmisartan. We switched. F/u "pins and needles, being dry"...better  F/u cramps w/Maxzide at high dose - stoped (there was no cramps w/low dose HCTZ). F/u occ palpitations. She has been drinking a lot of water... The patient presents for a follow-up of  chronic hypertension, chronic dyslipidemia, elev glu     Review of Systems  Constitutional: Negative for chills, activity change, appetite change, fatigue and unexpected weight change.  HENT: Negative for congestion, mouth sores and sinus pressure.   Eyes: Negative for visual disturbance.  Respiratory: Negative for cough and chest tightness.   Cardiovascular: Positive for chest pain.  Gastrointestinal: Negative for nausea and abdominal pain.  Genitourinary: Negative for frequency, difficulty urinating and vaginal pain.  Musculoskeletal: Negative for back pain and gait problem.  Skin: Negative for pallor and rash.  Neurological: Negative for dizziness, tremors, weakness, numbness and headaches.  Psychiatric/Behavioral: Negative for confusion and sleep disturbance.   Wt Readings from Last 3 Encounters:  01/30/14 189 lb (85.73 kg)  12/18/13 187 lb (84.823 kg)  08/18/13 192 lb (87.091 kg)   BP Readings from Last 3 Encounters:  01/30/14 152/78  12/18/13  110/82  08/18/13 136/80       Objective:   Physical Exam  Constitutional: She appears well-developed. No distress.  obese  HENT:  Head: Normocephalic.  Right Ear: External ear normal.  Left Ear: External ear normal.  Nose: Nose normal.  Mouth/Throat: Oropharynx is clear and moist.  Eyes: Conjunctivae are normal. Pupils are equal, round, and reactive to light. Right eye exhibits no discharge. Left eye exhibits no discharge.  Neck: Normal range of motion. Neck supple. No JVD present. No tracheal deviation present. No thyromegaly present.  Cardiovascular: Normal rate, regular rhythm and normal heart sounds.   Pulmonary/Chest: No stridor. No respiratory distress. She has no wheezes.  Abdominal: Soft. Bowel sounds are normal. She exhibits no distension and no mass. There is no tenderness. There is no rebound and no guarding.  Musculoskeletal: She exhibits edema (trace B). She exhibits no tenderness.  Lymphadenopathy:    She has no cervical adenopathy.  Neurological: She is alert. She has normal reflexes. No cranial nerve deficit. She exhibits normal muscle tone. Coordination normal.  Skin: No rash noted. No erythema.  Hyperpigmented skin over ankles  Psychiatric: She has a normal mood and affect. Her behavior is normal. Judgment and thought content normal.  R shoulder is less tender w/decr ROM R knee is tender    Lab Results  Component Value Date   WBC 6.8 10/19/2011   HGB 14.2 10/19/2011   HCT 42.2 10/19/2011   PLT 228.0 10/19/2011   GLUCOSE 123* 12/18/2013   CHOL 192 03/10/2010   TRIG 124.0 03/10/2010   HDL 46.70 03/10/2010   LDLDIRECT 139.3 02/21/2008   LDLCALC 121* 03/10/2010  ALT 27 12/18/2013   AST 22 12/18/2013   NA 135 12/18/2013   K 3.9 12/18/2013   CL 97 12/18/2013   CREATININE 0.6 12/18/2013   BUN 12 12/18/2013   CO2 30 12/18/2013   TSH 1.40 06/25/2013   HGBA1C 5.6 06/25/2013   MICROALBUR 0.4 06/25/2008    a complex case     Assessment & Plan:

## 2014-01-30 NOTE — Assessment & Plan Note (Signed)
Start Atenolol low dose

## 2014-01-30 NOTE — Assessment & Plan Note (Addendum)
Will repeat abd [place]. To ER if pain re-occurred. It looks like we will need a surgical consultation soon.  (2009 abd US IMPRESSION:  1. Single 1.5 cm gallstone. No pain over the gallbladder.  2. Fatty infiltration of the liver.)

## 2014-01-30 NOTE — Assessment & Plan Note (Signed)
Chronic, not on statins - declined 

## 2014-01-30 NOTE — Assessment & Plan Note (Signed)
Continue with current prescription therapy as reflected on the Med list.  

## 2014-01-30 NOTE — Progress Notes (Signed)
Pre visit review using our clinic review tool, if applicable. No additional management support is needed unless otherwise documented below in the visit note. 

## 2014-01-30 NOTE — Patient Instructions (Addendum)
Call 911 if chest pain re-occurred  Stop all vitamins, fish oil

## 2014-01-30 NOTE — Assessment & Plan Note (Addendum)
8/15 x2 episodes: heart vs GS (last CP attack 1 week ago) Abn EKG Card ref Abd [place] - fam h/o GS

## 2014-01-31 LAB — CK TOTAL AND CKMB (NOT AT ARMC)
CK TOTAL: 250 U/L — AB (ref 7–177)
CK, MB: 3 ng/mL (ref 0.0–5.0)
Relative Index: 1.2 (ref 0.0–4.0)

## 2014-02-04 ENCOUNTER — Ambulatory Visit
Admission: RE | Admit: 2014-02-04 | Discharge: 2014-02-04 | Disposition: A | Discharge status: Home / Self Care | Payer: Medicare Other | Source: Ambulatory Visit | Attending: Internal Medicine | Admitting: Internal Medicine

## 2014-02-05 ENCOUNTER — Ambulatory Visit (HOSPITAL_COMMUNITY)
Admission: RE | Admit: 2014-02-05 | Discharge: 2014-02-05 | Disposition: A | Discharge status: Home / Self Care | Payer: Medicare Other | Source: Ambulatory Visit | Attending: Internal Medicine | Admitting: Internal Medicine

## 2014-02-05 DIAGNOSIS — I10 Essential (primary) hypertension: Secondary | ICD-10-CM | POA: Insufficient documentation

## 2014-02-05 DIAGNOSIS — E785 Hyperlipidemia, unspecified: Secondary | ICD-10-CM | POA: Insufficient documentation

## 2014-02-05 DIAGNOSIS — R079 Chest pain, unspecified: Secondary | ICD-10-CM

## 2014-02-05 DIAGNOSIS — R000 Tachycardia, unspecified: Secondary | ICD-10-CM | POA: Diagnosis not present

## 2014-02-05 DIAGNOSIS — R0609 Other forms of dyspnea: Secondary | ICD-10-CM

## 2014-02-05 DIAGNOSIS — R0600 Dyspnea, unspecified: Secondary | ICD-10-CM

## 2014-02-05 DIAGNOSIS — R0789 Other chest pain: Secondary | ICD-10-CM | POA: Diagnosis not present

## 2014-02-05 DIAGNOSIS — I359 Nonrheumatic aortic valve disorder, unspecified: Secondary | ICD-10-CM

## 2014-02-05 NOTE — Progress Notes (Signed)
2D Echocardiogram Complete.  02/05/2014   Seraj Dunnam, RDCS

## 2014-02-13 ENCOUNTER — Encounter: Payer: Self-pay | Admitting: Internal Medicine

## 2014-02-13 ENCOUNTER — Ambulatory Visit (INDEPENDENT_AMBULATORY_CARE_PROVIDER_SITE_OTHER): Payer: Medicare Other | Admitting: Internal Medicine

## 2014-02-13 VITALS — BP 150/72 | Temp 98.3°F | Wt 188.0 lb

## 2014-02-13 DIAGNOSIS — R079 Chest pain, unspecified: Secondary | ICD-10-CM

## 2014-02-13 DIAGNOSIS — R931 Abnormal findings on diagnostic imaging of heart and coronary circulation: Secondary | ICD-10-CM

## 2014-02-13 DIAGNOSIS — R9389 Abnormal findings on diagnostic imaging of other specified body structures: Secondary | ICD-10-CM

## 2014-02-13 DIAGNOSIS — K8020 Calculus of gallbladder without cholecystitis without obstruction: Secondary | ICD-10-CM

## 2014-02-13 NOTE — Assessment & Plan Note (Addendum)
Surgical consult is needed - diiscussed

## 2014-02-13 NOTE — Assessment & Plan Note (Signed)
ECHO 02/05/14 Study Conclusions:  - Left ventricle: The cavity size was normal. Systolic function was normal. The estimated ejection fraction was in the range of 55% to 60%. Wall motion was normal; there were no regional wall motion abnormalities. There was an increased relative contribution of atrial contraction to ventricular filling. Doppler parameters are consistent with abnormal left ventricular relaxation (grade 1 diastolic dysfunction). - Aortic valve: Trileaflet; normal thickness, mildly calcified leaflets. There was mild regurgitation. - Mitral valve: Mild calcification. - Atrial septum: There was increased thickness of the septum, consistent with lipomatous hypertrophy. - Tricuspid valve: There was trivial regurgitation. - Pulmonic valve: There was trivial regurgitation.  Cont Rx. Cardiology consult

## 2014-02-13 NOTE — Assessment & Plan Note (Signed)
8/15 x2 episodes:GS's (last attack 3 week ago) ECHO reviewed

## 2014-02-13 NOTE — Progress Notes (Signed)
Pre visit review using our clinic review tool, if applicable. No additional management support is needed unless otherwise documented below in the visit note. 

## 2014-02-13 NOTE — Progress Notes (Signed)
Subjective:   No CP relapse  Chest Pain  This is a new problem. The current episode started 1 to 4 weeks ago. The onset quality is sudden. The problem occurs intermittently (x2). The problem has been resolved. The pain is present in the lateral region (L). The pain is severe. The quality of the pain is described as pressure and crushing (hours). The pain radiates to the left shoulder. Pertinent negatives include no abdominal pain, back pain, cough, dizziness, headaches, nausea, numbness or weakness. The pain is aggravated by nothing. She has tried analgesics for the symptoms. The treatment provided mild relief.   She had no more "gas" attacks B sisters have GSs   F/u Ins co wants her to switch from Micardis to Telmisartan. We switched. F/u "pins and needles, being dry"...better  F/u cramps w/Maxzide at high dose - stoped (there was no cramps w/low dose HCTZ). F/u occ palpitations. She has been drinking a lot of water... The patient presents for a follow-up of  chronic hypertension, chronic dyslipidemia, elev glu     Review of Systems  Constitutional: Negative for chills, activity change, appetite change, fatigue and unexpected weight change.  HENT: Negative for congestion, mouth sores and sinus pressure.   Eyes: Negative for visual disturbance.  Respiratory: Negative for cough and chest tightness.   Cardiovascular: Positive for chest pain.  Gastrointestinal: Negative for nausea and abdominal pain.  Genitourinary: Negative for frequency, difficulty urinating and vaginal pain.  Musculoskeletal: Negative for back pain and gait problem.  Skin: Negative for pallor and rash.  Neurological: Negative for dizziness, tremors, weakness, numbness and headaches.  Psychiatric/Behavioral: Negative for confusion and sleep disturbance.   Wt Readings from Last 3 Encounters:  02/13/14 188 lb (85.276 kg)  01/30/14 189 lb (85.73 kg)  12/18/13 187 lb (84.823 kg)   BP Readings from Last 3  Encounters:  02/13/14 150/72  01/30/14 152/78  12/18/13 110/82       Objective:   Physical Exam  Constitutional: She appears well-developed. No distress.  obese  HENT:  Head: Normocephalic.  Right Ear: External ear normal.  Left Ear: External ear normal.  Nose: Nose normal.  Mouth/Throat: Oropharynx is clear and moist.  Eyes: Conjunctivae are normal. Pupils are equal, round, and reactive to light. Right eye exhibits no discharge. Left eye exhibits no discharge.  Neck: Normal range of motion. Neck supple. No JVD present. No tracheal deviation present. No thyromegaly present.  Cardiovascular: Normal rate, regular rhythm and normal heart sounds.   Pulmonary/Chest: No stridor. No respiratory distress. She has no wheezes.  Abdominal: Soft. Bowel sounds are normal. She exhibits no distension and no mass. There is no tenderness. There is no rebound and no guarding.  Musculoskeletal: She exhibits edema (trace B). She exhibits no tenderness.  Lymphadenopathy:    She has no cervical adenopathy.  Neurological: She is alert. She has normal reflexes. No cranial nerve deficit. She exhibits normal muscle tone. Coordination normal.  Skin: No rash noted. No erythema.  Hyperpigmented skin over ankles  Psychiatric: She has a normal mood and affect. Her behavior is normal. Judgment and thought content normal.  R shoulder is less tender w/decr ROM R knee is tender    Lab Results  Component Value Date   WBC 6.7 01/30/2014   HGB 15.5* 01/30/2014   HCT 44.8 01/30/2014   PLT 225.0 01/30/2014   GLUCOSE 99 01/30/2014   CHOL 192 03/10/2010   TRIG 124.0 03/10/2010   HDL 46.70 03/10/2010   LDLDIRECT 139.3  02/21/2008   LDLCALC 121* 03/10/2010   ALT 27 01/30/2014   AST 23 01/30/2014   NA 137 01/30/2014   K 4.1 01/30/2014   CL 99 01/30/2014   CREATININE 0.5 01/30/2014   BUN 10 01/30/2014   CO2 29 01/30/2014   TSH 1.40 06/25/2013   HGBA1C 5.6 06/25/2013   MICROALBUR 0.4 06/25/2008     EXAM:  ULTRASOUND ABDOMEN  COMPLETE 02/04/14 COMPARISON: Ultrasound of March 09, 2008.  FINDINGS:  Gallbladder:  Large gallstone measuring 1.7 cm is noted. No significant  gallbladder wall thickening or pericholecystic fluid is noted. No  sonographic Murphy's sign is noted.  Common bile duct:  Diameter: Measures 6.3 mm distally which is within normal limits.  Liver:  Increased echogenicity of hepatic parenchyma is noted consistent  with fatty infiltration. 7 mm cyst is seen anteriorly in right  hepatic lobe. 2.4 cm cyst is noted posteriorly in the right hepatic  lobe.  IVC:  No abnormality visualized.  Pancreas:  Visualized portion unremarkable.  Spleen:  Size and appearance within normal limits.  Right Kidney:  Length: 11.5 cm. Echogenicity within normal limits. No mass or  hydronephrosis visualized.  Left Kidney:  Length: 12.1 cm. Echogenicity within normal limits. No mass or  hydronephrosis visualized.  Abdominal aorta:  No aneurysm visualized.  Other findings:  None.  IMPRESSION:  Fatty infiltration of the liver is noted.  Large solitary gallstone is noted without evidence of cholecystitis.  Electronically Signed  By: Sabino Dick M.D.  On: 02/04/2014 10:00  ECHO 02/05/14 Study Conclusions:  - Left ventricle: The cavity size was normal. Systolic function was normal. The estimated ejection fraction was in the range of 55% to 60%. Wall motion was normal; there were no regional wall motion abnormalities. There was an increased relative contribution of atrial contraction to ventricular filling. Doppler parameters are consistent with abnormal left ventricular relaxation (grade 1 diastolic dysfunction). - Aortic valve: Trileaflet; normal thickness, mildly calcified leaflets. There was mild regurgitation. - Mitral valve: Mild calcification. - Atrial septum: There was increased thickness of the septum, consistent with lipomatous hypertrophy. - Tricuspid valve: There was trivial regurgitation. -  Pulmonic valve: There was trivial regurgitation.        Assessment & Plan:

## 2014-03-02 ENCOUNTER — Ambulatory Visit (INDEPENDENT_AMBULATORY_CARE_PROVIDER_SITE_OTHER): Payer: Medicare Other | Admitting: Internal Medicine

## 2014-03-02 ENCOUNTER — Encounter: Payer: Self-pay | Admitting: Internal Medicine

## 2014-03-02 VITALS — BP 150/74 | HR 88 | Ht 61.5 in | Wt 186.1 lb

## 2014-03-02 DIAGNOSIS — Z136 Encounter for screening for cardiovascular disorders: Secondary | ICD-10-CM

## 2014-03-02 DIAGNOSIS — R002 Palpitations: Secondary | ICD-10-CM

## 2014-03-02 DIAGNOSIS — R079 Chest pain, unspecified: Secondary | ICD-10-CM

## 2014-03-02 DIAGNOSIS — I4949 Other premature depolarization: Secondary | ICD-10-CM

## 2014-03-02 DIAGNOSIS — R9389 Abnormal findings on diagnostic imaging of other specified body structures: Secondary | ICD-10-CM

## 2014-03-02 DIAGNOSIS — R931 Abnormal findings on diagnostic imaging of heart and coronary circulation: Secondary | ICD-10-CM

## 2014-03-02 DIAGNOSIS — Z01810 Encounter for preprocedural cardiovascular examination: Secondary | ICD-10-CM

## 2014-03-02 DIAGNOSIS — I10 Essential (primary) hypertension: Secondary | ICD-10-CM

## 2014-03-02 DIAGNOSIS — I493 Ventricular premature depolarization: Secondary | ICD-10-CM

## 2014-03-02 DIAGNOSIS — R0789 Other chest pain: Secondary | ICD-10-CM

## 2014-03-02 DIAGNOSIS — E785 Hyperlipidemia, unspecified: Secondary | ICD-10-CM

## 2014-03-02 NOTE — Progress Notes (Signed)
OFFICE NOTE  Chief Complaint:  Chest pain, palpitations  Primary Care Physician: Kathleen Kehr, MD  HPI:  Kathleen Mejia is a pleasant 80 year old female who is coming referred to me for evaluation of chest pain and palpitations. She reports about 2 weeks ago she had an episode of a UTI was treated with azithromycin. After 5 day course of treatment she was feeling much better but then 2 days later she developed palpitations and a feeling of chest pain that went up into her jaw and both arms. She also had some right upper quadrant tenderness. She had another episode a day later which improved a little bit with aspirin and drinking water. Subsequently she underwent abdominal ultrasound which demonstrated gallstones and she is scheduled for evaluation for possible cholecystectomy. While is certainly feasible that this could cause her symptoms, there is concern given her age, history of hypertension, family history of coronary disease and symptoms at this could be coronary related. She did have an echocardiogram fairly recently which showed a preserved EF with diastolic dysfunction. Just had an exercise treadmill stress test within one year which was negative, however the symptoms at this time are different. Her EKG shows sinus rhythm with PVCs at 88, therefore the PVCs is suspicious for possible ischemia.  PMHx:  Past Medical History  Diagnosis Date  . HTN (hypertension)   . LBP (low back pain)   . Hyperlipidemia   . Fatty liver   . Gallstone     1.5 cm  . Osteoarthritis   . Rosacea     Past Surgical History  Procedure Laterality Date  . Tonsillectomy      FAMHx:  Family History  Problem Relation Age of Onset  . Pancreatic cancer Mother   . Hypertension Mother     SOCHx:   reports that she has never smoked. She does not have any smokeless tobacco history on file. She reports that she does not drink alcohol or use illicit drugs.  ALLERGIES:  Allergies  Allergen Reactions    . Maxzide [Hydrochlorothiazide W-Triamterene]     Cramps w/a high dose    ROS: A comprehensive review of systems was negative except for: Cardiovascular: positive for chest pain and palpitations  HOME MEDS: Current Outpatient Prescriptions  Medication Sig Dispense Refill  . Ascorbic Acid (VITAMIN C PO) Take 1 tablet by mouth daily.      [name] atenolol (TENORMIN) 25 MG tablet Take 1 tablet (25 mg total) by mouth daily.  30 tablet  11  . b complex vitamins tablet Take 1 tablet by mouth daily.      . Cholecalciferol 1000 UNITS tablet Take 1,000 Units by mouth daily.        . Coenzyme Q10 (CO Q 10 PO) Take 1 capsule by mouth daily.      . fish oil-omega-3 fatty acids 1000 MG capsule Take 2 g by mouth 2 (two) times daily.        . meclizine (ANTIVERT) 12.5 MG tablet Take 1 tablet (12.5 mg total) by mouth 3 (three) times daily as needed.  60 tablet  1  . Multiple Vitamin (MULITIVITAMIN WITH MINERALS) TABS Take 1 tablet by mouth daily.      [name] spironolactone-hydrochlorothiazide (ALDACTAZIDE) 25-25 MG per tablet Take 0.5 tablets by mouth daily.  45 tablet  3  . telmisartan (MICARDIS) 40 MG tablet Take 1 tablet (40 mg total) by mouth daily.  90 tablet  3   No current facility-administered medications for this visit.  LABS/IMAGING: No results found for this or any previous visit (from the past 48 hour(s)). No results found.  VITALS: BP 150/74  Pulse 88  Ht 5' 1.5" (1.562 m)  Wt 186 lb 1.6 oz (84.414 kg)  BMI 34.60 kg/m2  EXAM: General appearance: alert and no distress Neck: no carotid bruit, no JVD and thyroid not enlarged, symmetric, no tenderness/mass/nodules Lungs: clear to auscultation bilaterally Heart: regular rate and rhythm, S1, S2 normal, no murmur, click, rub or gallop Abdomen: soft, non-tender; bowel sounds normal; no masses,  no organomegaly Extremities: extremities normal, atraumatic, no cyanosis or edema Pulses: 2+ and symmetric Skin: Skin color, texture, turgor normal.  No rashes or lesions Neurologic: Grossly normal Psych: Normal  EKG: Sinus rhythm with occasional PVCs at 88, voltage criteria for LVH  ASSESSMENT: 1. Chest pain with palpitations 2. Right upper quadrant pain with gallstones 3. Hypertension 4. PVCs  PLAN: 1.   [name] is describing 2 episodes of chest pain with some associated palpitations which are new onset. States she also had some right upper quadrant tenderness was found to have gallstones. Is unclear whether the 2 are related however she did have some jaw pain and pain that went across her back and down her arms. The symptoms sound somewhat like reflux or biliary colic. She is noted to have normal LV function by echo and had a treadmill exercise stress test about a year ago which was negative. Her PVCs and palpitations are new however, and therefore I'm concerned that her stress test may have missed possible ischemia. I would recommend an exercise nuclear stress test with a higher sensitivity. If this is negative then further workup for possible symptomatic cholecystitis is warranted.  Thank you for the kind referral.  Kathleen Casino, MD, [facility] Attending Cardiologist [facility]  Austine Kelsay C 03/02/2014, 8:28 AM

## 2014-03-02 NOTE — Patient Instructions (Signed)
Your physician has requested that you have en exercise stress myoview. For further information please visit [email or web address]. Please follow instruction sheet, as given.  Your physician recommends that you schedule a follow-up appointment after your stress test with Dr. Debara Pickett.

## 2014-03-10 ENCOUNTER — Telehealth (HOSPITAL_COMMUNITY): Payer: Self-pay

## 2014-03-10 ENCOUNTER — Encounter (INDEPENDENT_AMBULATORY_CARE_PROVIDER_SITE_OTHER): Payer: Self-pay | Admitting: General Surgery

## 2014-03-10 ENCOUNTER — Other Ambulatory Visit (INDEPENDENT_AMBULATORY_CARE_PROVIDER_SITE_OTHER): Payer: Self-pay | Admitting: General Surgery

## 2014-03-10 NOTE — Telephone Encounter (Signed)
Encounter complete. 

## 2014-03-12 ENCOUNTER — Encounter (HOSPITAL_COMMUNITY): Payer: Medicare Other

## 2014-03-17 ENCOUNTER — Telehealth: Payer: Self-pay | Admitting: Internal Medicine

## 2014-03-17 NOTE — Telephone Encounter (Signed)
Will forward to jenna, dr hilty's nirse

## 2014-03-17 NOTE — Telephone Encounter (Signed)
Pt is scheduled for Stress Test tomorrow. Please let them know asap if she is cleared for surgery,

## 2014-03-18 ENCOUNTER — Ambulatory Visit (HOSPITAL_COMMUNITY)
Admission: RE | Admit: 2014-03-18 | Discharge: 2014-03-18 | Disposition: A | Discharge status: Home / Self Care | Payer: Medicare Other | Source: Ambulatory Visit | Attending: Cardiology | Admitting: Cardiology

## 2014-03-18 DIAGNOSIS — E663 Overweight: Secondary | ICD-10-CM | POA: Diagnosis not present

## 2014-03-18 DIAGNOSIS — Z01810 Encounter for preprocedural cardiovascular examination: Secondary | ICD-10-CM

## 2014-03-18 DIAGNOSIS — R079 Chest pain, unspecified: Secondary | ICD-10-CM | POA: Diagnosis not present

## 2014-03-18 DIAGNOSIS — I10 Essential (primary) hypertension: Secondary | ICD-10-CM

## 2014-03-18 DIAGNOSIS — E785 Hyperlipidemia, unspecified: Secondary | ICD-10-CM | POA: Insufficient documentation

## 2014-03-18 DIAGNOSIS — R6884 Jaw pain: Secondary | ICD-10-CM | POA: Diagnosis not present

## 2014-03-18 DIAGNOSIS — Z136 Encounter for screening for cardiovascular disorders: Secondary | ICD-10-CM

## 2014-03-18 DIAGNOSIS — R002 Palpitations: Secondary | ICD-10-CM

## 2014-03-18 DIAGNOSIS — Z8249 Family history of ischemic heart disease and other diseases of the circulatory system: Secondary | ICD-10-CM | POA: Diagnosis not present

## 2014-03-18 DIAGNOSIS — R0609 Other forms of dyspnea: Secondary | ICD-10-CM | POA: Diagnosis not present

## 2014-03-18 DIAGNOSIS — R0789 Other chest pain: Secondary | ICD-10-CM

## 2014-03-18 MED ORDER — REGADENOSON 0.4 MG/5ML IV SOLN
0.4000 mg | Freq: Once | INTRAVENOUS | Status: AC
  Administered 2014-03-18: 0.4 mg via INTRAVENOUS

## 2014-03-18 MED ORDER — TECHNETIUM TC 99M SESTAMIBI GENERIC - CARDIOLITE
10.0000 | Freq: Once | INTRAVENOUS | Status: AC | PRN
  Administered 2014-03-18: 10 via INTRAVENOUS

## 2014-03-18 MED ORDER — TECHNETIUM TC 99M SESTAMIBI GENERIC - CARDIOLITE
30.0000 | Freq: Once | INTRAVENOUS | Status: AC | PRN
  Administered 2014-03-18: 30 via INTRAVENOUS

## 2014-03-18 MED ORDER — AMINOPHYLLINE 25 MG/ML IV SOLN
75.0000 mg | Freq: Once | INTRAVENOUS | Status: AC
  Administered 2014-03-18: 75 mg via INTRAVENOUS

## 2014-03-18 NOTE — Procedures (Addendum)
[facility] [address] [place] [id number]  Cardiology Nuclear Med Study  Kathleen Mejia is a 80 y.o. female     MRN : [id number]     DOB: [birth year]  Procedure Date: 03/18/2014  Nuclear Med Background Indication for Stress Test:  Evaluation for Ischemia History:  No prior cardiac or respiratory history reported;Last NUC MPI on 12/03/2001-nonischemic;EF=63%;ECHO on 02/07/2012-LV function normal. Cardiac Risk Factors: Family History - CAD, Hypertension, Lipids and Overweight  Symptoms:  Chest Pain, DOE, Fatigue, Light-Headedness, Palpitations and chest pain radiation to jaw and arms.   Nuclear Pre-Procedure Caffeine/Decaff Intake:  7:00pm NPO After: 5:00am   IV Site: R Forearm  IV 0.9% NS with Angio Cath:  22g  Chest Size (in):  n/a IV Started by: Rolene Course, RN  Height: 5' 1.5" (1.562 m)  Cup Size: D  BMI:  Body mass index is 34.58 kg/(m^2). Weight:  186 lb (84.369 kg)   Tech Comments:  Pt. Was unable to walk on treadmill and was changed to a Technical brewer Med Study 1 or 2 day study: 1 day  Stress Test Type:  Stress  Order Authorizing Provider:  Lyman Bishop, MD   Resting Radionuclide: Technetium 91m Sestamibi  Resting Radionuclide Dose: 10.7 mCi   Stress Radionuclide:  Technetium 21m Sestamibi  Stress Radionuclide Dose: 31.0 mCi           Stress Protocol Rest HR: 72 Stress HR: 81  Rest BP: 145/79 Stress BP: 149/66  Exercise Time (min): n/a METS: n/a   Predicted Max HR: 142 bpm % Max HR: 69.01 bpm Rate Pressure Product: 15190  Dose of Adenosine (mg):  n/a Dose of Lexiscan: 0.4 mg  Dose of Atropine (mg): n/a Dose of Dobutamine: n/a mcg/kg/min (at max HR)  Stress Test Technologist: Leane Para, CCT Nuclear Technologist: Otho Perl, CNMT   Rest Procedure:  Myocardial perfusion imaging was performed at rest 45 minutes following the intravenous administration of Technetium 69m  Sestamibi. and Myocardial perfusion imaging was performed at rest 4 hours post stress injection by intravenous administration of Thallous Chloride TI201, wait 20-30 minutes, and image. Stress Procedure:  The patient received IV Lexiscan 0.4 mg over 15-seconds.  Technetium 98m Sestamibi injected IV at 30-seconds.  Patient experienced SOB and 75 mg Aminophylline IV was administered. There were no significant changes with Lexiscan.  Quantitative spect images were obtained after a 45 minute delay.  Transient Ischemic Dilatation (Normal <1.22):  1.15 QGS EDV:  84 ml QGS ESV:  34 ml LV Ejection Fraction: 59%     Rest ECG: NSR - Normal EKG  Stress ECG: No significant change from baseline ECG and There are scattered PVCs.  QPS Raw Data Images:  There is a breast shadow that accounts for the anterior attenuation. Stress Images:  There is a mild reduction in tracer uptake in a small anteroapical area. SSS 3 Rest Images:  Normal homogeneous uptake in all areas of the myocardium. Subtraction (SDS):  Possible small area of anteroapical ischemia LV Wall Motion:  NL LV Function; NL Wall Motion  Impression Exercise Capacity:  Lexiscan with no exercise. BP Response:  Normal blood pressure response. Clinical Symptoms:  No significant symptoms noted. ECG Impression:  No significant ST segment change suggestive of ischemia. Comparison with Prior Nuclear Study: there is a new area of mild anteroapical reversible perfusion abnormality   Overall Impression:  Low risk stress nuclear study with a small area of  mild anteroapical ischemia (versus shifting breast attenuation artifact).[name], MD  03/18/2014 12:04 PM

## 2014-03-19 ENCOUNTER — Encounter: Payer: Self-pay | Admitting: *Deleted

## 2014-03-19 NOTE — Telephone Encounter (Signed)
What risk for surgery?

## 2014-03-19 NOTE — Telephone Encounter (Signed)
Letter e-faxed through [facility]

## 2014-03-19 NOTE — Telephone Encounter (Signed)
Low risk for surgery.  Dr. Lemmie Evens

## 2014-03-19 NOTE — Telephone Encounter (Signed)
Faxed clearance letter to [phone]

## 2014-03-19 NOTE — Telephone Encounter (Signed)
Patient notified. Test results routed to PCP via EPIC per patient request

## 2014-03-23 ENCOUNTER — Encounter (HOSPITAL_COMMUNITY): Payer: Self-pay | Admitting: Pharmacy Technician

## 2014-03-25 NOTE — Pre-Procedure Instructions (Signed)
Kathleen Mejia  03/25/2014   Your procedure is scheduled on:  Friday, March 27, 2014  Report to [facility] [place] at 10:30 AM.  Call this number if you have problems the morning of surgery: [phone]   Remember:   Do not eat food or drink liquids after midnight.   Take these medicines the morning of surgery with A SIP OF WATER: atenolol (Tenormin)  STOP taking Aspirin, Goody's, BC's, Aleve (Naproxen), Ibuprofen (Advil or Motrin), Fish Oil, Vitamins, Herbal Supplements or any substance that could thin your blood starting today, Thursday, March 26, 2014.  Continue to take all your other medicines as you normally do until day of surgery and then follow above instructions.   Do not wear jewelry, make-up or nail polish.  Do not wear lotions, powders, or perfumes. You may wear deodorant.  Do not shave 48 hours prior to surgery.  Do not bring valuables to the hospital.  [facility] is not responsible                  for any belongings or valuables.               Contacts, dentures or bridgework may not be worn into surgery.  Leave suitcase in the car. After surgery it may be brought to your room.  For patients admitted to the hospital, discharge time is determined by your                treatment team.               Patients discharged the day of surgery will not be allowed to drive  home.    Special Instructions: Please use CHG soap the night before surgery and the day of surgery. CHG soap should be used atleast twice.   Please read over the following fact sheets that you were given: Pain Booklet, Coughing and Deep Breathing and Surgical Site Infection Prevention

## 2014-03-26 ENCOUNTER — Encounter (HOSPITAL_COMMUNITY)
Admission: RE | Admit: 2014-03-26 | Discharge: 2014-03-26 | Disposition: A | Discharge status: Home / Self Care | Payer: Medicare Other | Source: Ambulatory Visit | Attending: General Surgery | Admitting: General Surgery

## 2014-03-26 ENCOUNTER — Encounter (HOSPITAL_COMMUNITY)
Admission: RE | Admit: 2014-03-26 | Discharge: 2014-03-26 | Disposition: A | Discharge status: Home / Self Care | Payer: Medicare Other | Source: Ambulatory Visit | Attending: Anesthesiology | Admitting: Anesthesiology

## 2014-03-26 ENCOUNTER — Encounter (HOSPITAL_COMMUNITY): Payer: Self-pay

## 2014-03-26 DIAGNOSIS — R0602 Shortness of breath: Secondary | ICD-10-CM

## 2014-03-26 DIAGNOSIS — Z6835 Body mass index (BMI) 35.0-35.9, adult: Secondary | ICD-10-CM | POA: Diagnosis not present

## 2014-03-26 DIAGNOSIS — K8010 Calculus of gallbladder with chronic cholecystitis without obstruction: Secondary | ICD-10-CM | POA: Diagnosis present

## 2014-03-26 DIAGNOSIS — M1990 Unspecified osteoarthritis, unspecified site: Secondary | ICD-10-CM | POA: Diagnosis not present

## 2014-03-26 DIAGNOSIS — R0789 Other chest pain: Secondary | ICD-10-CM | POA: Diagnosis not present

## 2014-03-26 DIAGNOSIS — E785 Hyperlipidemia, unspecified: Secondary | ICD-10-CM | POA: Diagnosis not present

## 2014-03-26 DIAGNOSIS — E669 Obesity, unspecified: Secondary | ICD-10-CM | POA: Diagnosis not present

## 2014-03-26 DIAGNOSIS — I10 Essential (primary) hypertension: Secondary | ICD-10-CM | POA: Diagnosis not present

## 2014-03-26 HISTORY — DX: Unspecified dislocation of unspecified shoulder joint, initial encounter: S43.006A

## 2014-03-26 HISTORY — DX: Shortness of breath: R06.02

## 2014-03-26 LAB — CBC WITH DIFFERENTIAL/PLATELET
Basophils Absolute: 0 10*3/uL (ref 0.0–0.1)
Basophils Relative: 0 % (ref 0–1)
EOS ABS: 0.1 10*3/uL (ref 0.0–0.7)
EOS PCT: 1 % (ref 0–5)
HEMATOCRIT: 45.4 % (ref 36.0–46.0)
Hemoglobin: 15.3 g/dL — ABNORMAL HIGH (ref 12.0–15.0)
LYMPHS ABS: 1.6 10*3/uL (ref 0.7–4.0)
Lymphocytes Relative: 24 % (ref 12–46)
MCH: 33 pg (ref 26.0–34.0)
MCHC: 33.7 g/dL (ref 30.0–36.0)
MCV: 97.8 fL (ref 78.0–100.0)
MONO ABS: 0.7 10*3/uL (ref 0.1–1.0)
MONOS PCT: 11 % (ref 3–12)
Neutro Abs: 4.3 10*3/uL (ref 1.7–7.7)
Neutrophils Relative %: 64 % (ref 43–77)
Platelets: 192 10*3/uL (ref 150–400)
RBC: 4.64 MIL/uL (ref 3.87–5.11)
RDW: 12.9 % (ref 11.5–15.5)
WBC: 6.7 10*3/uL (ref 4.0–10.5)

## 2014-03-26 LAB — COMPREHENSIVE METABOLIC PANEL
ALT: 19 U/L (ref 0–35)
ANION GAP: 13 (ref 5–15)
AST: 18 U/L (ref 0–37)
Albumin: 4 g/dL (ref 3.5–5.2)
Alkaline Phosphatase: 80 U/L (ref 39–117)
BUN: 12 mg/dL (ref 6–23)
CALCIUM: 9.5 mg/dL (ref 8.4–10.5)
CO2: 25 meq/L (ref 19–32)
CREATININE: 0.54 mg/dL (ref 0.50–1.10)
Chloride: 99 mEq/L (ref 96–112)
GFR calc Af Amer: >90 mL/min (ref >90)
GFR calc non Af Amer: 88 mL/min — ABNORMAL LOW (ref >90)
GLUCOSE: 95 mg/dL (ref 70–99)
Potassium: 4.2 mEq/L (ref 3.7–5.3)
SODIUM: 137 meq/L (ref 137–147)
TOTAL PROTEIN: 7.8 g/dL (ref 6.0–8.3)
Total Bilirubin: 0.4 mg/dL (ref 0.3–1.2)

## 2014-03-26 LAB — URINALYSIS, ROUTINE W REFLEX MICROSCOPIC
Bilirubin Urine: NEGATIVE
Glucose, UA: NEGATIVE mg/dL
Hgb urine dipstick: NEGATIVE
Ketones, ur: NEGATIVE mg/dL
Leukocytes, UA: NEGATIVE
NITRITE: NEGATIVE
Protein, ur: NEGATIVE mg/dL
SPECIFIC GRAVITY, URINE: 1.021 (ref 1.005–1.030)
UROBILINOGEN UA: 0.2 mg/dL (ref 0.0–1.0)
pH: 7 (ref 5.0–8.0)

## 2014-03-26 MED ORDER — CEFAZOLIN SODIUM-DEXTROSE 2-3 GM-% IV SOLR
2.0000 g | INTRAVENOUS | Status: DC
  Filled 2014-03-26: qty 50

## 2014-03-26 MED ORDER — CHLORHEXIDINE GLUCONATE 4 % EX LIQD
1.0000 "application " | Freq: Once | CUTANEOUS | Status: DC
  Filled 2014-03-26: qty 15

## 2014-03-26 NOTE — H&P (Signed)
Kathleen Mejia  Location: [facility] Patient #: [id number] DOB: 09-10-1933 Widowed / Language: Cleophus Molt / Race: White Female  History of Present Illness  Patient words: Gallstones.  The patient is a 80 year old female who presents for evaluation of gall stones. This patient was referred by Dr. Walker Kehr for evaluation of gallstones. Dr. Debara Pickett is her cardiologist. In 2009 she had episode of abdominal or chest pain. An ultrasound was performed at that time which shows a large gallstone but no inflammatory changes. Nothing further was done. She recently was treated for a urinary tract infection and took a Z-Pak. After that was completed she had a dinner of fried eggplant and woke up in the middle of my with some chest discomfort pain in her jaws and pain in both arms and numbness and tingling in her fingers. This resolved after about 4 hours. She had a lot of belching but no nausea or vomiting. No back pain. No change in her bowel habits. She was very anxious. She'll second episode 24 hours later which then resolved. She saw Dr. Charyl Bigger on 02/13/2014. Apparently an EKG was unremarkable . WBC and liver function tests were normal. TSH was normal. Dr. Alain Marion felt that it was most likely biliary colic. She was referred to Dr. Debara Pickett who plans a stress test in 2 days. Past history is significant for hypertension and mild obesity but otherwise she has been doing fairly well. Family history significant for both sisters having cholecystectomy. Mother had pancreatic cancer. Maternal grandmother had pancreatic cancer. She is here with her son throughout the encounter today. She is in no distress. She has been watching her diet.  I advised elective cholecystectomy if her cardiac workup is negative. She and her son agree. I have discussed indications, details, techniques, and numerous risk of the surgery with them. They're aware of the risk of bleeding, infection, conversion to open  laparotomy, bile leak, injury to adjacent organs, wound problems and hernias and other problems. They understand all these issues and all of their questions were answered. They agree with this plan.   Other Problems Erasmo Leventhal, RN, BSN; 03/10/2014 8:28 AM) Arthritis Back Pain Cholelithiasis High blood pressure Hypercholesterolemia  Past Surgical History Erasmo Leventhal, RN, BSN; 03/10/2014 8:28 AM) Tonsillectomy  Diagnostic Studies History (Erasmo Leventhal, RN, BSN; 03/10/2014 8:28 AM) Colonoscopy never Mammogram within last year Pap Smear >5 years ago  Social History (Erasmo Leventhal, RN, BSN; 03/10/2014 8:28 AM) Alcohol use Occasional alcohol use. Caffeine use Coffee, Tea. No drug use Tobacco use Never smoker.  Family History (Erasmo Leventhal, RN, BSN; 03/10/2014 8:28 AM) Alcohol Abuse Father. Arthritis Mother, Sister. Breast Cancer Mother. Diabetes Mellitus Sister. Heart Disease Sister. Hypertension Mother, Sister. Kidney Disease Sister. Malignant Neoplasm Of Pancreas Mother. Thyroid problems Mother, Sister.  Pregnancy / Birth History (Erasmo Leventhal, RN, BSN; 03/10/2014 8:28 AM) Age at menarche 82 years. Age of menopause 39-50 Gravida 1 Maternal age 26-20 Para 2  Review of Systems Occupational hygienist, BSN; 03/10/2014 8:28 AM) General Present- Fatigue and Weight Gain. Not Present- Appetite Loss, Chills, Fever, Night Sweats and Weight Loss. Skin Present- Dryness. Not Present- Change in Wart/Mole, Hives, Jaundice, New Lesions, Non-Healing Wounds, Rash and Ulcer. HEENT Present- Wears glasses/contact lenses. Not Present- Earache, Hearing Loss, Hoarseness, Nose Bleed, Oral Ulcers, Ringing in the Ears, Seasonal Allergies, Sinus Pain, Sore Throat, Visual Disturbances and Yellow Eyes. Respiratory Not Present- Bloody sputum, Chronic Cough, Difficulty Breathing, Snoring and Wheezing. Breast Not Present- Breast Mass, Breast Pain, Nipple  Discharge and  Skin Changes. Cardiovascular Present- Rapid Heart Rate and Shortness of Breath. Not Present- Chest Pain, Difficulty Breathing Lying Down, Leg Cramps, Palpitations and Swelling of Extremities. Gastrointestinal Present- Bloating and Excessive gas. Not Present- Abdominal Pain, Bloody Stool, Change in Bowel Habits, Chronic diarrhea, Constipation, Difficulty Swallowing, Gets full quickly at meals, Hemorrhoids, Indigestion, Nausea, Rectal Pain and Vomiting. Female Genitourinary Present- Frequency. Not Present- Nocturia, Painful Urination, Pelvic Pain and Urgency. Musculoskeletal Present- Joint Pain and Joint Stiffness. Not Present- Back Pain, Muscle Pain, Muscle Weakness and Swelling of Extremities. Neurological Present- Trouble walking. Not Present- Decreased Memory, Fainting, Headaches, Numbness, Seizures, Tingling, Tremor and Weakness. Psychiatric Not Present- Anxiety, Bipolar, Change in Sleep Pattern, Depression, Fearful and Frequent crying. Endocrine Present- Hair Changes. Not Present- Cold Intolerance, Excessive Hunger, Heat Intolerance, Hot flashes and New Diabetes. Hematology Not Present- Easy Bruising, Excessive bleeding, Gland problems, HIV and Persistent Infections.   Vitals Occupational hygienist, BSN; 03/10/2014 8:28 AM) 03/10/2014 8:28 AM Weight: 187.4 lb Height: 61in Body Surface Area: 1.91 m Body Mass Index: 35.41 kg/m Pulse: 62 (Regular)  Resp.: 18 (Unlabored)  BP: 124/64 (Sitting, Left Arm, Standard)    Physical Exam  General Mental Status-Alert. General Appearance-Consistent with stated age. Hydration-Well hydrated. Voice-Normal. Note: Alert. No distress. BMI 35.   Head and Neck Head-normocephalic, atraumatic with no lesions or palpable masses.  Eye Eyeball - Bilateral-Extraocular movements intact. Sclera/Conjunctiva - Bilateral-No scleral icterus.  Chest and Lung Exam Chest and lung exam reveals -quiet, even and easy respiratory effort with  no use of accessory muscles and on auscultation, normal breath sounds, no adventitious sounds and normal vocal resonance. Inspection Chest Wall - Normal. Back - normal.  Cardiovascular Cardiovascular examination reveals -on palpation PMI is normal in location and amplitude, no palpable S3 or S4. Normal cardiac borders., normal heart sounds, regular rate and rhythm with no murmurs, carotid auscultation reveals no bruits and normal pedal pulses bilaterally.  Abdomen Inspection Inspection of the abdomen reveals - No Hernias. Skin - Scar - no surgical scars. Palpation/Percussion Palpation and Percussion of the abdomen reveal - Soft, Non Tender, No Rebound tenderness, No Rigidity (guarding) and No hepatosplenomegaly. Auscultation Auscultation of the abdomen reveals - Bowel sounds normal. Note: Abdomen is mildly obese. Soft. Nontender. No mass. No hernia. Unremarkable exam.   Neurologic Neurologic evaluation reveals -alert and oriented x 3 with no impairment of recent or remote memory. Mental Status-Normal.  Musculoskeletal Normal Exam - Left-Upper Extremity Strength Normal and Lower Extremity Strength Normal. Normal Exam - Right-Upper Extremity Strength Normal, Lower Extremity Weakness.    Assessment & Plan  GALLSTONES (574.20  K80.20) Impression: I suspect that she is having biliary colic, although it is atypical. Will schedule for cholecystectomy if cardiac workup is negative. Current Plans  Schedule for Surgery Pt Education - [facility] HYPERTENSION, BENIGN (401.1  I10) CHEST PAIN, ATYPICAL (786.59  R07.89) OBESITY (BMI 30-39.9) (278.00  E66.9) Impression: Low-fat diet strongly encouraged. Avoid fried foods. Avoid fast food restaurants.    Edsel Petrin. Dalbert Batman, M.D., [facility] General and Minimally invasive Surgery Breast and Colorectal Surgery Office:   [phone] Pager:   [phone]

## 2014-03-26 NOTE — Progress Notes (Addendum)
Pt. Had experienced one time chest pain back in July. Went to see her primary Dr. Cecelia Byars and he referred her to Dr. Debara Pickett. Pt. Had a stress test and Dr. Debara Pickett  Stated she was a low risk for surgery. Pt. States she has not had  Any pain since that episode. Langley Adie PA aware. Stated she didn't need to see pt.

## 2014-03-26 NOTE — Progress Notes (Signed)
Anesthesia Chart Review:  Patient is a 80 year old female scheduled for laparoscopic cholecystectomy on 03/27/14 by Dr. Dalbert Batman.  History includes non-smoker, HTN, fatty liver, HLD, exertional dyspnea, osteoarthritis, low back pain, tonsillectomy, right shoulder dislocation '12. BMI is consistent with obesity.  PCP is Dr. Alain Marion. She was referred to cardiologist Dr. Debara Pickett in 02/2014 due to chest pain and palpitations, PVCs, and had a low risk stress test.   Nuclear stress test on 03/18/14 showed: Overall Impression: Low risk stress nuclear study with a small area of mild anteroapical ischemia (versus shifting breast attenuation artifact). LVEF 59%. Dr. Debara Pickett reviewed results, and ultimately cleared patient with low CV risk for cholecystectomy. (See Letter tab.)  Echo on 02/05/14 showed: - Left ventricle: The cavity size was normal. Systolic function was normal. The estimated ejection fraction was in the range of 55% to 60%. Wall motion was normal; there were no regional wall motion abnormalities. There was an increased relative contribution of atrial contraction to ventricular filling. Doppler parameters are consistent with abnormal left ventricular relaxation (grade 1 diastolic dysfunction). - Aortic valve: Trileaflet; normal thickness, mildly calcified leaflets. There was mild regurgitation. - Mitral valve: Mild calcification. - Atrial septum: There was increased thickness of the septum, consistent with lipomatous hypertrophy. - Tricuspid valve: There was trivial regurgitation. - Pulmonic valve: There was trivial regurgitation.  EKG on 03/02/14 showed: SR, possible LAE, LVH, junctional ST depression, probably normal.  CXR on 03/26/14 showed: Subcentimeter nodular density at the LEFT lung base, recommend nipple shadows for confirmation. Otherwise clear chest.  Preoperative labs noted. AST/ALT WNL.   I notified triage nurse Tonya at [facility] regarding CXR findings.  She will have Dr. Dalbert Batman review for  follow-up recommendations (ie consideration of repeat CXR with nipple markers during her hospitalization versus forwarding to her PCP for out-patient follow-up).  From an anesthesia standpoint repeat CXR would not necessarily have to be done prior to surgery.  [facility]/Anesthesiology Phone [phone] 03/26/2014 11:21 AM

## 2014-03-27 ENCOUNTER — Ambulatory Visit (HOSPITAL_COMMUNITY)
Admission: RE | Admit: 2014-03-27 | Discharge: 2014-03-27 | Disposition: A | Discharge status: Home / Self Care | Payer: Medicare Other | Source: Ambulatory Visit | Attending: General Surgery | Admitting: General Surgery

## 2014-03-27 ENCOUNTER — Ambulatory Visit (HOSPITAL_COMMUNITY): Payer: Medicare Other | Admitting: Certified Registered Nurse Anesthetist

## 2014-03-27 ENCOUNTER — Encounter (HOSPITAL_COMMUNITY)
Admission: RE | Disposition: A | Discharge status: Home / Self Care | Payer: Self-pay | Source: Ambulatory Visit | Attending: General Surgery

## 2014-03-27 ENCOUNTER — Encounter (HOSPITAL_COMMUNITY): Payer: Self-pay | Admitting: Certified Registered Nurse Anesthetist

## 2014-03-27 ENCOUNTER — Other Ambulatory Visit (INDEPENDENT_AMBULATORY_CARE_PROVIDER_SITE_OTHER): Payer: Self-pay | Admitting: General Surgery

## 2014-03-27 ENCOUNTER — Ambulatory Visit (HOSPITAL_COMMUNITY): Payer: Medicare Other

## 2014-03-27 ENCOUNTER — Encounter (HOSPITAL_COMMUNITY): Payer: Medicare Other | Admitting: Vascular Surgery

## 2014-03-27 DIAGNOSIS — M1990 Unspecified osteoarthritis, unspecified site: Secondary | ICD-10-CM | POA: Insufficient documentation

## 2014-03-27 DIAGNOSIS — E785 Hyperlipidemia, unspecified: Secondary | ICD-10-CM | POA: Diagnosis not present

## 2014-03-27 DIAGNOSIS — R0789 Other chest pain: Secondary | ICD-10-CM | POA: Insufficient documentation

## 2014-03-27 DIAGNOSIS — K8010 Calculus of gallbladder with chronic cholecystitis without obstruction: Secondary | ICD-10-CM | POA: Insufficient documentation

## 2014-03-27 DIAGNOSIS — K8020 Calculus of gallbladder without cholecystitis without obstruction: Secondary | ICD-10-CM

## 2014-03-27 DIAGNOSIS — I10 Essential (primary) hypertension: Secondary | ICD-10-CM | POA: Diagnosis not present

## 2014-03-27 DIAGNOSIS — R911 Solitary pulmonary nodule: Secondary | ICD-10-CM

## 2014-03-27 DIAGNOSIS — Z6835 Body mass index (BMI) 35.0-35.9, adult: Secondary | ICD-10-CM | POA: Insufficient documentation

## 2014-03-27 DIAGNOSIS — E669 Obesity, unspecified: Secondary | ICD-10-CM | POA: Insufficient documentation

## 2014-03-27 HISTORY — PX: CHOLECYSTECTOMY: SHX55

## 2014-03-27 SURGERY — LAPAROSCOPIC CHOLECYSTECTOMY WITH INTRAOPERATIVE CHOLANGIOGRAM
Anesthesia: General | Site: Abdomen

## 2014-03-27 MED ORDER — OXYCODONE HCL 5 MG PO TABS: 5.0000 mg | ORAL_TABLET | ORAL | Status: DC | PRN

## 2014-03-27 MED ORDER — FENTANYL CITRATE 0.05 MG/ML IJ SOLN
INTRAMUSCULAR | Status: DC | PRN
  Administered 2014-03-27: 50 ug via INTRAVENOUS
  Administered 2014-03-27: 100 ug via INTRAVENOUS
  Administered 2014-03-27 (×2): 50 ug via INTRAVENOUS

## 2014-03-27 MED ORDER — ARTIFICIAL TEARS OP OINT
TOPICAL_OINTMENT | OPHTHALMIC | Status: AC
  Filled 2014-03-27: qty 3.5

## 2014-03-27 MED ORDER — HYDROCODONE-ACETAMINOPHEN 5-325 MG PO TABS: 1.0000 | ORAL_TABLET | Freq: Four times a day (QID) | ORAL | Status: DC | PRN

## 2014-03-27 MED ORDER — FENTANYL CITRATE 0.05 MG/ML IJ SOLN
INTRAMUSCULAR | Status: DC
Start: 2014-03-27 — End: 2014-03-27
  Filled 2014-03-27: qty 2

## 2014-03-27 MED ORDER — GLYCOPYRROLATE 0.2 MG/ML IJ SOLN
INTRAMUSCULAR | Status: DC | PRN
  Administered 2014-03-27: 0.6 mg via INTRAVENOUS

## 2014-03-27 MED ORDER — EPHEDRINE SULFATE 50 MG/ML IJ SOLN
INTRAMUSCULAR | Status: DC | PRN
  Administered 2014-03-27 (×2): 5 mg via INTRAVENOUS

## 2014-03-27 MED ORDER — SODIUM CHLORIDE 0.9 % IJ SOLN
3.0000 mL | Freq: Two times a day (BID) | INTRAMUSCULAR | Status: DC
Start: 2014-03-27 — End: 2014-03-27

## 2014-03-27 MED ORDER — GLYCOPYRROLATE 0.2 MG/ML IJ SOLN
INTRAMUSCULAR | Status: AC
  Filled 2014-03-27: qty 3

## 2014-03-27 MED ORDER — ONDANSETRON HCL 4 MG/2ML IJ SOLN
INTRAMUSCULAR | Status: AC
  Filled 2014-03-27: qty 2

## 2014-03-27 MED ORDER — BUPIVACAINE-EPINEPHRINE 0.5% -1:200000 IJ SOLN
INTRAMUSCULAR | Status: DC | PRN
  Administered 2014-03-27: 6 mL

## 2014-03-27 MED ORDER — ROCURONIUM BROMIDE 50 MG/5ML IV SOLN
INTRAVENOUS | Status: AC
  Filled 2014-03-27: qty 1

## 2014-03-27 MED ORDER — ACETAMINOPHEN 325 MG PO TABS
650.0000 mg | ORAL_TABLET | ORAL | Status: DC | PRN
  Filled 2014-03-27: qty 2

## 2014-03-27 MED ORDER — NEOSTIGMINE METHYLSULFATE 10 MG/10ML IV SOLN
INTRAVENOUS | Status: DC | PRN
  Administered 2014-03-27: 4 mg via INTRAVENOUS

## 2014-03-27 MED ORDER — SODIUM CHLORIDE 0.9 % IR SOLN
Status: DC | PRN
  Administered 2014-03-27: 1

## 2014-03-27 MED ORDER — SODIUM CHLORIDE 0.9 % IV SOLN
INTRAVENOUS | Status: DC | PRN
  Administered 2014-03-27: 12:00:00

## 2014-03-27 MED ORDER — LACTATED RINGERS IV SOLN
INTRAVENOUS | Status: DC
  Administered 2014-03-27: 11:00:00 via INTRAVENOUS

## 2014-03-27 MED ORDER — FENTANYL CITRATE 0.05 MG/ML IJ SOLN
INTRAMUSCULAR | Status: AC
  Filled 2014-03-27: qty 5

## 2014-03-27 MED ORDER — ACETAMINOPHEN 500 MG PO TABS: 1000.0000 mg | ORAL_TABLET | Freq: Four times a day (QID) | ORAL | Status: DC

## 2014-03-27 MED ORDER — ACETAMINOPHEN 650 MG RE SUPP
650.0000 mg | RECTAL | Status: DC | PRN
  Filled 2014-03-27: qty 1

## 2014-03-27 MED ORDER — FENTANYL CITRATE 0.05 MG/ML IJ SOLN
25.0000 ug | INTRAMUSCULAR | Status: DC | PRN
  Administered 2014-03-27: 50 ug via INTRAVENOUS

## 2014-03-27 MED ORDER — LIDOCAINE HCL (CARDIAC) 20 MG/ML IV SOLN
INTRAVENOUS | Status: AC
  Filled 2014-03-27: qty 5

## 2014-03-27 MED ORDER — ROCURONIUM BROMIDE 100 MG/10ML IV SOLN
INTRAVENOUS | Status: DC | PRN
  Administered 2014-03-27: 40 mg via INTRAVENOUS

## 2014-03-27 MED ORDER — LACTATED RINGERS IV SOLN
INTRAVENOUS | Status: DC | PRN
  Administered 2014-03-27: 11:00:00 via INTRAVENOUS

## 2014-03-27 MED ORDER — SODIUM CHLORIDE 0.9 % IJ SOLN: 3.0000 mL | INTRAMUSCULAR | Status: DC | PRN

## 2014-03-27 MED ORDER — LIDOCAINE HCL (CARDIAC) 20 MG/ML IV SOLN
INTRAVENOUS | Status: DC | PRN
  Administered 2014-03-27: 40 mg via INTRAVENOUS

## 2014-03-27 MED ORDER — DEXAMETHASONE SODIUM PHOSPHATE 4 MG/ML IJ SOLN
INTRAMUSCULAR | Status: DC | PRN
  Administered 2014-03-27: 4 mg via INTRAVENOUS

## 2014-03-27 MED ORDER — PROPOFOL 10 MG/ML IV BOLUS
INTRAVENOUS | Status: AC
  Filled 2014-03-27: qty 20

## 2014-03-27 MED ORDER — SODIUM CHLORIDE 0.9 % IV SOLN: 250.0000 mL | INTRAVENOUS | Status: DC | PRN

## 2014-03-27 MED ORDER — ARTIFICIAL TEARS OP OINT
TOPICAL_OINTMENT | OPHTHALMIC | Status: DC | PRN
  Administered 2014-03-27: 1 via OPHTHALMIC

## 2014-03-27 MED ORDER — PROPOFOL 10 MG/ML IV BOLUS
INTRAVENOUS | Status: DC | PRN
  Administered 2014-03-27: 100 mg via INTRAVENOUS

## 2014-03-27 MED ORDER — 0.9 % SODIUM CHLORIDE (POUR BTL) OPTIME
TOPICAL | Status: DC | PRN
  Administered 2014-03-27: 1000 mL

## 2014-03-27 MED ORDER — HYDROMORPHONE HCL 1 MG/ML IJ SOLN
0.2500 mg | INTRAMUSCULAR | Status: DC | PRN
  Administered 2014-03-27: 0.5 mg via INTRAVENOUS

## 2014-03-27 MED ORDER — BUPIVACAINE-EPINEPHRINE (PF) 0.5% -1:200000 IJ SOLN
INTRAMUSCULAR | Status: AC
  Filled 2014-03-27: qty 30

## 2014-03-27 MED ORDER — ONDANSETRON HCL 4 MG/2ML IJ SOLN
INTRAMUSCULAR | Status: DC | PRN
  Administered 2014-03-27: 4 mg via INTRAVENOUS

## 2014-03-27 MED ORDER — HYDROMORPHONE HCL 1 MG/ML IJ SOLN
INTRAMUSCULAR | Status: DC
Start: 2014-03-27 — End: 2014-03-27
  Filled 2014-03-27: qty 1

## 2014-03-27 MED ORDER — SODIUM CHLORIDE 0.9 % IV SOLN
INTRAVENOUS | Status: DC
  Administered 2014-03-27: 14:00:00 via INTRAVENOUS

## 2014-03-27 SURGICAL SUPPLY — 45 items
ADH SKN CLS APL DERMABOND .7 (GAUZE/BANDAGES/DRESSINGS) ×1
APPLIER CLIP ROT 10 11.4 M/L (STAPLE) ×2
APR CLP MED LRG 11.4X10 (STAPLE) ×1
BAG SPEC RTRVL LRG 6X4 10 (ENDOMECHANICALS) ×1
BLADE SURG ROTATE 9660 (MISCELLANEOUS) IMPLANT
CANISTER SUCTION 2500CC (MISCELLANEOUS) ×2 IMPLANT
CHLORAPREP W/TINT 26ML (MISCELLANEOUS) ×2 IMPLANT
CLIP APPLIE ROT 10 11.4 M/L (STAPLE) ×1 IMPLANT
COVER MAYO STAND STRL (DRAPES) ×2 IMPLANT
COVER SURGICAL LIGHT HANDLE (MISCELLANEOUS) ×2 IMPLANT
DECANTER SPIKE VIAL GLASS SM (MISCELLANEOUS) ×4 IMPLANT
DERMABOND ADVANCED (GAUZE/BANDAGES/DRESSINGS) ×1
DERMABOND ADVANCED .7 DNX12 (GAUZE/BANDAGES/DRESSINGS) ×1 IMPLANT
DRAPE C-ARM 42X72 X-RAY (DRAPES) ×2 IMPLANT
DRAPE LAPAROSCOPIC ABDOMINAL (DRAPES) ×2 IMPLANT
DRAPE UTILITY 15X26 W/TAPE STR (DRAPE) ×4 IMPLANT
ELECT REM PT RETURN 9FT ADLT (ELECTROSURGICAL) ×2
ELECTRODE REM PT RTRN 9FT ADLT (ELECTROSURGICAL) ×1 IMPLANT
GLOVE BIOGEL PI IND STRL 7.0 (GLOVE) IMPLANT
GLOVE BIOGEL PI INDICATOR 7.0 (GLOVE) ×2
GLOVE EUDERMIC 7 POWDERFREE (GLOVE) ×2 IMPLANT
GLOVE SURG SS PI 6.5 STRL IVOR (GLOVE) ×1 IMPLANT
GLOVE SURG SS PI 7.0 STRL IVOR (GLOVE) ×1 IMPLANT
GOWN STRL REUS W/ TWL LRG LVL3 (GOWN DISPOSABLE) ×3 IMPLANT
GOWN STRL REUS W/ TWL XL LVL3 (GOWN DISPOSABLE) ×1 IMPLANT
GOWN STRL REUS W/TWL LRG LVL3 (GOWN DISPOSABLE) ×6
GOWN STRL REUS W/TWL XL LVL3 (GOWN DISPOSABLE) ×2
KIT BASIN OR (CUSTOM PROCEDURE TRAY) ×2 IMPLANT
KIT ROOM TURNOVER OR (KITS) ×2 IMPLANT
NS IRRIG 1000ML POUR BTL (IV SOLUTION) ×2 IMPLANT
PAD ARMBOARD 7.5X6 YLW CONV (MISCELLANEOUS) ×2 IMPLANT
POUCH SPECIMEN RETRIEVAL 10MM (ENDOMECHANICALS) ×2 IMPLANT
SCISSORS LAP 5X35 DISP (ENDOMECHANICALS) ×2 IMPLANT
SET CHOLANGIOGRAPH 5 50 .035 (SET/KITS/TRAYS/PACK) ×2 IMPLANT
SET IRRIG TUBING LAPAROSCOPIC (IRRIGATION / IRRIGATOR) ×2 IMPLANT
SLEEVE ENDOPATH XCEL 5M (ENDOMECHANICALS) ×2 IMPLANT
SPECIMEN JAR SMALL (MISCELLANEOUS) ×2 IMPLANT
SUT MNCRL AB 4-0 PS2 18 (SUTURE) ×2 IMPLANT
TOWEL OR 17X24 6PK STRL BLUE (TOWEL DISPOSABLE) ×2 IMPLANT
TOWEL OR 17X26 10 PK STRL BLUE (TOWEL DISPOSABLE) ×2 IMPLANT
TRAY LAPAROSCOPIC (CUSTOM PROCEDURE TRAY) ×2 IMPLANT
TROCAR XCEL BLUNT TIP 100MML (ENDOMECHANICALS) ×2 IMPLANT
TROCAR XCEL NON-BLD 11X100MML (ENDOMECHANICALS) ×2 IMPLANT
TROCAR XCEL NON-BLD 5MMX100MML (ENDOMECHANICALS) ×2 IMPLANT
TUBING INSUFFLATION (TUBING) ×2 IMPLANT

## 2014-03-27 NOTE — Anesthesia Postprocedure Evaluation (Signed)
  Anesthesia Post-op Note  Patient: Kathleen Mejia  Procedure(s) Performed: Procedure(s): LAPAROSCOPIC CHOLECYSTECTOMY WITH INTRAOPERATIVE CHOLANGIOGRAM (N/A)  Patient Location: PACU  Anesthesia Type:General  Level of Consciousness: awake and alert   Airway and Oxygen Therapy: Patient Spontanous Breathing  Post-op Pain: mild  Post-op Assessment: Post-op Vital signs reviewed, Patient's Cardiovascular Status Stable and Respiratory Function Stable  Post-op Vital Signs: Reviewed  Filed Vitals:   03/27/14 1346  BP:   Pulse: 56  Temp:   Resp: 18    Complications: No apparent anesthesia complications

## 2014-03-27 NOTE — Anesthesia Procedure Notes (Signed)
Procedure Name: Intubation Date/Time: 03/27/2014 11:26 AM Performed by: Ned Grace Pre-anesthesia Checklist: Patient identified, Timeout performed, Emergency Drugs available, Suction available and Patient being monitored Patient Re-evaluated:Patient Re-evaluated prior to inductionOxygen Delivery Method: Circle system utilized Preoxygenation: Pre-oxygenation with 100% oxygen Intubation Type: IV induction Ventilation: Mask ventilation without difficulty and Oral airway inserted - appropriate to patient size Laryngoscope Size: Mac and 3 Grade View: Grade I Tube type: Oral Tube size: 7.0 mm Number of attempts: 1 Airway Equipment and Method: Stylet and Oral airway Placement Confirmation: ETT inserted through vocal cords under direct vision,  breath sounds checked- equal and bilateral and positive ETCO2 Secured at: 21 cm Tube secured with: Tape Dental Injury: Teeth and Oropharynx as per pre-operative assessment

## 2014-03-27 NOTE — Discharge Instructions (Signed)
[facility] ______[facility] °LAPAROSCOPIC SURGERY: POST OP INSTRUCTIONS °Always review your discharge instruction sheet given to you by the facility where your surgery was performed. °IF YOU HAVE DISABILITY OR FAMILY LEAVE FORMS, YOU MUST BRING THEM TO THE OFFICE FOR PROCESSING.   °DO NOT GIVE THEM TO YOUR DOCTOR. ° °1. A prescription for pain medication may be given to you upon discharge.  Take your pain medication as prescribed, if needed.  If narcotic pain medicine is not needed, then you may take acetaminophen (Tylenol) or ibuprofen (Advil) as needed. °2. Take your usually prescribed medications unless otherwise directed. °3. If you need a refill on your pain medication, please contact your pharmacy.  They will contact our office to request authorization. Prescriptions will not be filled after 5pm or on week-ends. °4. You should follow a light diet the first few days after arrival home, such as soup and crackers, etc.  Be sure to include lots of fluids daily. °5. Most patients will experience some swelling and bruising in the area of the incisions.  Ice packs will help.  Swelling and bruising can take several days to resolve.  °6. It is common to experience some constipation if taking pain medication after surgery.  Increasing fluid intake and taking a stool softener (such as Colace) will usually help or prevent this problem from occurring.  A mild laxative (Milk of Magnesia or Miralax) should be taken according to package instructions if there are no bowel movements after 48 hours. °7. Unless discharge instructions indicate otherwise, you may remove your bandages 24-48 hours after surgery, and you may shower at that time.  You may have steri-strips (small skin tapes) in place directly over the incision.  These strips should be left on the skin for 7-10 days.  If your surgeon used skin glue on the incision, you may shower in 24 hours.  The glue will flake off over the next 2-3 weeks.  Any sutures or  staples will be removed at the office during your follow-up visit. °8. ACTIVITIES:  You may resume regular (light) daily activities beginning the next day--such as daily self-care, walking, climbing stairs--gradually increasing activities as tolerated.  You may have sexual intercourse when it is comfortable.  Refrain from any heavy lifting or straining until approved by your doctor. °a. You may drive when you are no longer taking prescription pain medication, you can comfortably wear a seatbelt, and you can safely maneuver your car and apply brakes. °b. RETURN TO WORK:  __________________________________________________________ °9. You should see your doctor in the office for a follow-up appointment approximately 2-3 weeks after your surgery.  Make sure that you call for this appointment within a day or two after you arrive home to insure a convenient appointment time. °10. OTHER INSTRUCTIONS: __________________________________________________________________________________________________________________________ __________________________________________________________________________________________________________________________ °WHEN TO CALL YOUR DOCTOR: °1. Fever over 101.0 °2. Inability to urinate °3. Continued bleeding from incision. °4. Increased pain, redness, or drainage from the incision. °5. Increasing abdominal pain ° °The clinic staff is available to answer your questions during regular business hours.  Please don’t hesitate to call and ask to speak to one of the nurses for clinical concerns.  If you have a medical emergency, go to the nearest emergency room or call 911.  A surgeon from [facility] is always on call at the hospital. °[address], [address], [place], [place]  [address] ? [address] [address], [place], [place]   [address] °[phone] ? [phone] ? FAX [phone] °Web site:   [email or web address] ° °What to eat: ° °For your first meals, you should eat  lightly; only small meals initially.  If you do not have nausea, you may eat larger meals.  Avoid spicy, greasy and heavy food.   ° °General Anesthesia, Adult, Care After  °Refer to this sheet in the next few weeks. These instructions provide you with information on caring for yourself after your procedure. Your health care provider may also give you more specific instructions. Your treatment has been planned according to current medical practices, but problems sometimes occur. Call your health care provider if you have any problems or questions after your procedure.  °WHAT TO EXPECT AFTER THE PROCEDURE  °After the procedure, it is typical to experience:  °Sleepiness.  °Nausea and vomiting. °HOME CARE INSTRUCTIONS  °For the first 24 hours after general anesthesia:  °Have a responsible person with you.  °Do not drive a car. If you are alone, do not take public transportation.  °Do not drink alcohol.  °Do not take medicine that has not been prescribed by your health care provider.  °Do not sign important papers or make important decisions.  °You may resume a normal diet and activities as directed by your health care provider.  °Change bandages (dressings) as directed.  °If you have questions or problems that seem related to general anesthesia, call the hospital and ask for the anesthetist or anesthesiologist on call. °SEEK MEDICAL CARE IF:  °You have nausea and vomiting that continue the day after anesthesia.  °You develop a rash. °SEEK IMMEDIATE MEDICAL CARE IF:  °You have difficulty breathing.  °You have chest pain.  °You have any allergic problems. °Document Released: 08/28/2000 Document Revised: 01/22/2013 Document Reviewed: 12/05/2012  °ExitCare® Patient Information ©2014 [employer].  ° ° °

## 2014-03-27 NOTE — Anesthesia Preprocedure Evaluation (Addendum)
Anesthesia Evaluation  Patient identified by MRN, date of birth, ID band Patient awake    Reviewed: Allergy & Precautions, H&P , NPO status , Patient's Chart, lab work & pertinent test results, reviewed documented beta blocker date and time   Airway Mallampati: II TM Distance: >3 FB Neck ROM: Full    Dental no notable dental hx. (+) Edentulous Upper, Dental Advisory Given   Pulmonary neg pulmonary ROS,  breath sounds clear to auscultation  Pulmonary exam normal       Cardiovascular hypertension, Pt. on medications and Pt. on home beta blockers + Peripheral Vascular Disease Rhythm:Regular Rate:Normal     Neuro/Psych negative neurological ROS  negative psych ROS   GI/Hepatic negative GI ROS, Neg liver ROS,   Endo/Other  negative endocrine ROS  Renal/GU negative Renal ROS  negative genitourinary   Musculoskeletal  (+) Arthritis -, Osteoarthritis,    Abdominal   Peds  Hematology negative hematology ROS (+)   Anesthesia Other Findings   Reproductive/Obstetrics negative OB ROS                         Anesthesia Physical Anesthesia Plan  ASA: II  Anesthesia Plan: General   Post-op Pain Management:    Induction: Intravenous  Airway Management Planned: Oral ETT  Additional Equipment:   Intra-op Plan:   Post-operative Plan: Extubation in OR  Informed Consent: I have reviewed the patients History and Physical, chart, labs and discussed the procedure including the risks, benefits and alternatives for the proposed anesthesia with the patient or authorized representative who has indicated his/her understanding and acceptance.   Dental advisory given  Plan Discussed with: CRNA  Anesthesia Plan Comments:        Anesthesia Quick Evaluation

## 2014-03-27 NOTE — Transfer of Care (Signed)
Immediate Anesthesia Transfer of Care Note  Patient: Kathleen Mejia  Procedure(s) Performed: Procedure(s): LAPAROSCOPIC CHOLECYSTECTOMY WITH INTRAOPERATIVE CHOLANGIOGRAM (N/A)  Patient Location: PACU  Anesthesia Type:General  Level of Consciousness: awake, alert , oriented and patient cooperative  Airway & Oxygen Therapy: Patient Spontanous Breathing and Patient connected to nasal cannula oxygen  Post-op Assessment: Report given to PACU RN, Post -op Vital signs reviewed and stable and Patient moving all extremities  Post vital signs: Reviewed and stable  Complications: No apparent anesthesia complications

## 2014-03-27 NOTE — Op Note (Signed)
Patient Name:           Kathleen Mejia   Date of Surgery:        03/27/2014  Pre op Diagnosis:      Chronic cholecystitis with cholelithiasis  Post op Diagnosis:    Chronic cholecystitis with cholelithiasis  Procedure:                 Laparoscopic cholecystectomy with cholangiogram  Surgeon:                     Edsel Petrin. Dalbert Batman, M.D., FACS  Assistant:                      OR staff  Operative Indications:   The patient is a 80 year old female who presents for evaluation of gall stones.  This patient was referred by Dr. Walker Kehr for evaluation of gallstones. Dr. Debara Pickett is her cardiologist.  In 2009 she had episode of abdominal or chest pain. An ultrasound was performed at that time which shows a large gallstone but no inflammatory changes. Nothing further was done.  She recently was treated for a urinary tract infection and took a Z-Pak. After that was completed she had a dinner of fried eggplant and woke up in the middle of my with some chest discomfort pain in her jaws and pain in both arms and numbness and tingling in her fingers. This resolved after about 4 hours. She had a lot of belching but no nausea or vomiting. No back pain. No change in her bowel habits. She was very anxious. She'll second episode 24 hours later which then resolved.  She saw Dr. Charyl Bigger on 02/13/2014. Apparently an EKG was unremarkable . WBC and liver function tests were normal. TSH was normal. Dr. Alain Marion felt that it was most likely biliary colic.  She was referred to Dr. Debara Pickett who recently performed a cardiac evaluation and stated the patient was low risk.  She is brought to the operating room electively. She has requested that she be discharged home today, if everything goes well.   Operative Findings:       The gallbladder was chronically inflamed, moderate adhesions of omentum to the gallbladder. The gallbladder contained palpable stones. Intraoperative cholangiogram was normal showing normal  intrahepatic and extrahepatic biliary anatomy, no filling defect, and prompt flow of contrast into the duodenum. The stomach, duodenum, small intestine, and large intestine were grossly normal to inspection.  Procedure in Detail:          Following the induction of general endotracheal anesthesia the patient's abdomen was prepped and draped in a sterile fashion, intravenous antibiotics were given, and a surgical timeout was performed. 0.5% Marcaine with epinephrine was used as local infiltration anesthetic. An 11 mm trocar was placed in the midline at the lower rim of the umbilicus with an open technique. Pneumoperitoneum was created and camera was inserted. There was no evidence of bleeding or intra-abdominal injury. 11 mm trochar was placed in the subxiphoid region and two 5 mm trocars placed in the right upper quadrant.      The gallbladder fundus was identified and elevated. Adhesions were taken down. We elevated the infundibulum and completed the adhesio lysis. The peritoneum was incised around the neck of the gallbladder. A window was created behind the cystic duct and cystic artery. A cholangiogram catheter was inserted into the cystic duct and a cholangiogram was obtained with the C-arm. The cholangiogram was normal as  described above. The catheter was removed and the cystic duct and cystic artery were secured with multiple metal clips and divided. The gallbladder was dissected from his bed with electrocautery placed in a specimen bag removed. We made one small hole in the gallbladder and spilled some clear bile but no stones. The subhepatic and subphrenic spaces were copiously irrigated with saline until the irrigation fluid was completely clear. The gallbladder bed and operative field was inspected and there was no bleeding or bile leak. The pneumoperitoneum was released the trocars were removed. The fascia at the umbilicus was closed with 0 Vicryl sutures. Skin incisions were closed with subcuticular  4-0 Monocryl and Dermabond. The patient tolerated the procedure well and was taken to PACU in stable condition. EBL 10 cc. Counts correct. Palpitations none.     Edsel Petrin. Dalbert Batman, M.D., FACS General and Minimally Invasive Surgery Breast and Colorectal Surgery  03/27/2014 12:25 PM

## 2014-03-27 NOTE — Progress Notes (Signed)
Up and voided without difficulty; no c/o nausea or pain

## 2014-03-27 NOTE — Interval H&P Note (Signed)
History and Physical Interval Note:  03/27/2014 10:59 AM  Kathleen Mejia  has presented today for surgery, with the diagnosis of gallstones  The various methods of treatment have been discussed with the patient and family. After consideration of risks, benefits and other options for treatment, the patient has consented to  Procedure(s): LAPAROSCOPIC CHOLECYSTECTOMY WITH INTRAOPERATIVE CHOLANGIOGRAM/POSSIBLE OPEN (N/A) as a surgical intervention .  The patient's history has been reviewed, patient examined, no change in status, stable for surgery.  I have reviewed the patient's chart and labs.  Questions were answered to the patient's satisfaction.     Adin Hector

## 2014-03-31 ENCOUNTER — Encounter (HOSPITAL_COMMUNITY): Payer: Self-pay | Admitting: General Surgery

## 2014-04-02 ENCOUNTER — Other Ambulatory Visit (INDEPENDENT_AMBULATORY_CARE_PROVIDER_SITE_OTHER): Payer: Self-pay

## 2014-04-06 ENCOUNTER — Other Ambulatory Visit (INDEPENDENT_AMBULATORY_CARE_PROVIDER_SITE_OTHER): Payer: Self-pay | Admitting: General Surgery

## 2014-04-06 ENCOUNTER — Ambulatory Visit
Admission: RE | Admit: 2014-04-06 | Discharge: 2014-04-06 | Disposition: A | Discharge status: Home / Self Care | Payer: Medicare Other | Source: Ambulatory Visit | Attending: General Surgery | Admitting: General Surgery

## 2014-04-06 ENCOUNTER — Ambulatory Visit: Payer: Medicare Other | Admitting: Internal Medicine

## 2014-04-06 DIAGNOSIS — K8020 Calculus of gallbladder without cholecystitis without obstruction: Secondary | ICD-10-CM

## 2014-04-07 ENCOUNTER — Telehealth (INDEPENDENT_AMBULATORY_CARE_PROVIDER_SITE_OTHER): Payer: Self-pay

## 2014-04-07 NOTE — Telephone Encounter (Signed)
-----   Message from Fanny Skates, MD sent at 04/06/2014  6:16 PM EST ----- Call radiology reports to patient.Tell her that the chest x-ray with nipple markers looks fine. There is no lung mass or nodular density. This is good news.  hmi

## 2014-04-07 NOTE — Telephone Encounter (Signed)
Per Dr Darrel Hoover request pt notified of cxr result.

## 2014-04-09 ENCOUNTER — Ambulatory Visit: Payer: Medicare Other | Admitting: Internal Medicine

## 2014-04-28 ENCOUNTER — Encounter: Payer: Self-pay | Admitting: Internal Medicine

## 2014-04-28 ENCOUNTER — Ambulatory Visit (INDEPENDENT_AMBULATORY_CARE_PROVIDER_SITE_OTHER): Payer: Medicare Other | Admitting: Internal Medicine

## 2014-04-28 VITALS — BP 140/82 | HR 89 | Temp 98.0°F | Wt 179.0 lb

## 2014-04-28 DIAGNOSIS — I493 Ventricular premature depolarization: Secondary | ICD-10-CM

## 2014-04-28 DIAGNOSIS — M25579 Pain in unspecified ankle and joints of unspecified foot: Secondary | ICD-10-CM

## 2014-04-28 DIAGNOSIS — K8010 Calculus of gallbladder with chronic cholecystitis without obstruction: Secondary | ICD-10-CM

## 2014-04-28 DIAGNOSIS — I10 Essential (primary) hypertension: Secondary | ICD-10-CM

## 2014-04-28 DIAGNOSIS — R000 Tachycardia, unspecified: Secondary | ICD-10-CM

## 2014-04-28 DIAGNOSIS — E785 Hyperlipidemia, unspecified: Secondary | ICD-10-CM

## 2014-04-28 MED ORDER — ATENOLOL 25 MG PO TABS: 12.5000 mg | ORAL_TABLET | Freq: Two times a day (BID) | ORAL | Status: DC

## 2014-04-28 MED ORDER — SPIRONOLACTONE-HCTZ 25-25 MG PO TABS: 0.5000 | ORAL_TABLET | Freq: Every day | ORAL | Status: DC

## 2014-04-28 MED ORDER — TELMISARTAN 40 MG PO TABS: 40.0000 mg | ORAL_TABLET | Freq: Every day | ORAL | Status: DC

## 2014-04-28 NOTE — Assessment & Plan Note (Signed)
On fish oil 

## 2014-04-28 NOTE — Assessment & Plan Note (Signed)
Doing well s/p lap chole

## 2014-04-28 NOTE — Assessment & Plan Note (Signed)
Continue with current prescription therapy as reflected on the Med list.  

## 2014-04-28 NOTE — Progress Notes (Signed)
Subjective:   No CP relapse - s/p cholecystectomy C/o painful feet  Chest Pain  This is a recurrent problem. The problem has been resolved. The patient is experiencing no pain. Pertinent negatives include no abdominal pain, back pain, cough, dizziness, headaches, nausea, numbness or weakness.   She had no more "gas" attacks B sisters have GSs  F/u cramps w/Maxzide at high dose - stoped (there was no cramps w/low dose HCTZ). F/u occ palpitations. She has been drinking a lot of water...   The patient presents for a follow-up of  chronic hypertension, chronic dyslipidemia, elev glu, tachycardia     Review of Systems  Constitutional: Negative for chills, activity change, appetite change, fatigue and unexpected weight change.  HENT: Negative for congestion, mouth sores and sinus pressure.   Eyes: Negative for visual disturbance.  Respiratory: Negative for cough and chest tightness.   Cardiovascular: Positive for chest pain.  Gastrointestinal: Negative for nausea and abdominal pain.  Genitourinary: Negative for frequency, difficulty urinating and vaginal pain.  Musculoskeletal: Negative for back pain and gait problem.  Skin: Negative for pallor and rash.  Neurological: Negative for dizziness, tremors, weakness, numbness and headaches.  Psychiatric/Behavioral: Negative for confusion and sleep disturbance.   Wt Readings from Last 3 Encounters:  04/28/14 179 lb (81.194 kg)  03/26/14 184 lb 12.8 oz (83.825 kg)  03/18/14 186 lb (84.369 kg)   BP Readings from Last 3 Encounters:  04/28/14 140/82  03/27/14 156/60  03/26/14 143/47       Objective:   Physical Exam  Constitutional: She appears well-developed. No distress.  HENT:  Head: Normocephalic.  Right Ear: External ear normal.  Left Ear: External ear normal.  Nose: Nose normal.  Mouth/Throat: Oropharynx is clear and moist.  Eyes: Conjunctivae are normal. Pupils are equal, round, and reactive to light. Right eye exhibits  no discharge. Left eye exhibits no discharge.  Neck: Normal range of motion. Neck supple. No JVD present. No tracheal deviation present. No thyromegaly present.  Cardiovascular: Normal rate, regular rhythm and normal heart sounds.   Pulmonary/Chest: No stridor. No respiratory distress. She has no wheezes.  Abdominal: Soft. Bowel sounds are normal. She exhibits no distension and no mass. There is no tenderness. There is no rebound and no guarding.  Musculoskeletal: She exhibits no edema or tenderness.  Lymphadenopathy:    She has no cervical adenopathy.  Neurological: She displays normal reflexes. No cranial nerve deficit. She exhibits normal muscle tone. Coordination normal.  Skin: No rash noted. No erythema.  Psychiatric: She has a normal mood and affect. Her behavior is normal. Judgment and thought content normal.  scars are healed on abd Feet are sensitive/OA    Lab Results  Component Value Date   WBC 6.7 03/26/2014   HGB 15.3* 03/26/2014   HCT 45.4 03/26/2014   PLT 192 03/26/2014   GLUCOSE 95 03/26/2014   CHOL 192 03/10/2010   TRIG 124.0 03/10/2010   HDL 46.70 03/10/2010   LDLDIRECT 139.3 02/21/2008   LDLCALC 121* 03/10/2010   ALT 19 03/26/2014   AST 18 03/26/2014   NA 137 03/26/2014   K 4.2 03/26/2014   CL 99 03/26/2014   CREATININE 0.54 03/26/2014   BUN 12 03/26/2014   CO2 25 03/26/2014   TSH 1.40 06/25/2013   HGBA1C 5.6 06/25/2013   MICROALBUR 0.4 06/25/2008     EXAM:  ULTRASOUND ABDOMEN COMPLETE 02/04/14 COMPARISON: Ultrasound of March 09, 2008.  FINDINGS:  Gallbladder:  Large gallstone measuring 1.7 cm is noted.  No significant  gallbladder wall thickening or pericholecystic fluid is noted. No  sonographic Murphy's sign is noted.  Common bile duct:  Diameter: Measures 6.3 mm distally which is within normal limits.  Liver:  Increased echogenicity of hepatic parenchyma is noted consistent  with fatty infiltration. 7 mm cyst is seen anteriorly in right   hepatic lobe. 2.4 cm cyst is noted posteriorly in the right hepatic  lobe.  IVC:  No abnormality visualized.  Pancreas:  Visualized portion unremarkable.  Spleen:  Size and appearance within normal limits.  Right Kidney:  Length: 11.5 cm. Echogenicity within normal limits. No mass or  hydronephrosis visualized.  Left Kidney:  Length: 12.1 cm. Echogenicity within normal limits. No mass or  hydronephrosis visualized.  Abdominal aorta:  No aneurysm visualized.  Other findings:  None.  IMPRESSION:  Fatty infiltration of the liver is noted.  Large solitary gallstone is noted without evidence of cholecystitis.  Electronically Signed  By: Sabino Dick M.D.  On: 02/04/2014 10:00  ECHO 02/05/14 Study Conclusions:  - Left ventricle: The cavity size was normal. Systolic function was normal. The estimated ejection fraction was in the range of 55% to 60%. Wall motion was normal; there were no regional wall motion abnormalities. There was an increased relative contribution of atrial contraction to ventricular filling. Doppler parameters are consistent with abnormal left ventricular relaxation (grade 1 diastolic dysfunction). - Aortic valve: Trileaflet; normal thickness, mildly calcified leaflets. There was mild regurgitation. - Mitral valve: Mild calcification. - Atrial septum: There was increased thickness of the septum, consistent with lipomatous hypertrophy. - Tricuspid valve: There was trivial regurgitation. - Pulmonic valve: There was trivial regurgitation.        Assessment & Plan:

## 2014-04-28 NOTE — Assessment & Plan Note (Signed)
11/15 [facility] ref

## 2014-04-28 NOTE — Assessment & Plan Note (Signed)
Cont Atenolol

## 2014-04-28 NOTE — Progress Notes (Signed)
Pre visit review using our clinic review tool, if applicable. No additional management support is needed unless otherwise documented below in the visit note. 

## 2014-04-29 ENCOUNTER — Telehealth: Payer: Self-pay | Admitting: Internal Medicine

## 2014-04-29 NOTE — Telephone Encounter (Signed)
emmi mailed  °

## 2014-06-16 ENCOUNTER — Ambulatory Visit (INDEPENDENT_AMBULATORY_CARE_PROVIDER_SITE_OTHER): Payer: Medicare Other

## 2014-06-16 ENCOUNTER — Ambulatory Visit: Payer: Self-pay

## 2014-06-16 VITALS — BP 164/99 | HR 89 | Resp 12

## 2014-06-16 DIAGNOSIS — S93105A Unspecified dislocation of left toe(s), initial encounter: Secondary | ICD-10-CM

## 2014-06-16 DIAGNOSIS — R52 Pain, unspecified: Secondary | ICD-10-CM

## 2014-06-16 DIAGNOSIS — M778 Other enthesopathies, not elsewhere classified: Secondary | ICD-10-CM

## 2014-06-16 DIAGNOSIS — M7752 Other enthesopathy of left foot: Secondary | ICD-10-CM

## 2014-06-16 DIAGNOSIS — M779 Enthesopathy, unspecified: Secondary | ICD-10-CM

## 2014-06-16 DIAGNOSIS — M2040 Other hammer toe(s) (acquired), unspecified foot: Secondary | ICD-10-CM

## 2014-06-16 NOTE — Progress Notes (Signed)
   Subjective:    Patient ID: KASSADEE LARAMORE, female    DOB: 1933/10/03, 81 y.o.   MRN: [id number]  HPI  ''B/L 2ND TOE ARE SWOLLEN AND TURNING THE OTHER WAY.''  PT STATED B/L 2ND TOE ARE SWOLLEN AND TURNING THE OTHER WAY 10 + YEARS. THE TOE ARE GETTING WORSE AND GET AGGRAVATED BY WEARING CERTAIN. TRIED NO TREATMENT.  Review of Systems  Musculoskeletal: Positive for gait problem.  All other systems reviewed and are negative.      Objective:   Physical Exam This is a 81 year old white female well-developed well-nourished or 3 presents at this time with complaint of problem is with her second toes X she all of her toes are medially displaced second bilateral most severe in the left has similar complete subluxation or dislocation at the MTP joint. Remaining lateral digits are not painful or symptomatic the second toe does have pains overlapping the hallux on the left and somewhat on the right as well but not as severe. No history of injury or trauma patient wearing slip on type shoes in the past for dress shoes heels the right shoes indicates she can't find shoes with good support and fit. Vascular status is intact with pedal pulses palpable DP +2 PT plus one over 4 bilateral Refill time 3 seconds mild varicosities noted neurologically epicritic and proprioceptive sensations intact and symmetric there is normal plantar response DTRs not listed dermatologic the skin color pigment normal hair growth absent nail somewhat criptotic orthopedic exam there is rectus foot type has previously had partial nail excision of both hallux there is no regrowth on the left but the right hallux has regrowth of nail although irregular with history of contusion in the recent past. There is dorsal displacement and medial displacement second digit subsecond MTP joint left confirmed with 3 graft.       Assessment & Plan:  Assessment hammertoe deformities with dislocation of second MTP joint left being most severe right  not as severe although still present at this time discussed options conservative care padding cushioning buddy splinting and wider accommodative shoes the other was more invasive options steroid injections for temporary relief surgery for hammertoe repair which would likely [facility] necessitate multiple hammertoe repairs on the left and possibly right foot the other last alternative would be the most simple or possible common sense solution would be a simple second toe amputation to alleviate the deformity of the second toe. We discussed this literature about hammertoe surgery and foot surgery in general is dispensed for patient to review and consult patient will follow-up within the next month for reevaluation next  Harriet Masson DPM

## 2014-06-16 NOTE — Patient Instructions (Signed)

## 2014-07-07 ENCOUNTER — Other Ambulatory Visit: Payer: Self-pay

## 2014-07-07 DIAGNOSIS — Z1231 Encounter for screening mammogram for malignant neoplasm of breast: Secondary | ICD-10-CM

## 2014-07-29 ENCOUNTER — Ambulatory Visit (INDEPENDENT_AMBULATORY_CARE_PROVIDER_SITE_OTHER): Payer: Medicare Other | Admitting: Internal Medicine

## 2014-07-29 ENCOUNTER — Encounter: Payer: Self-pay | Admitting: Internal Medicine

## 2014-07-29 ENCOUNTER — Other Ambulatory Visit (INDEPENDENT_AMBULATORY_CARE_PROVIDER_SITE_OTHER): Payer: Medicare Other

## 2014-07-29 VITALS — BP 104/60 | HR 94 | Temp 98.5°F | Wt 176.0 lb

## 2014-07-29 DIAGNOSIS — K8010 Calculus of gallbladder with chronic cholecystitis without obstruction: Secondary | ICD-10-CM

## 2014-07-29 DIAGNOSIS — E785 Hyperlipidemia, unspecified: Secondary | ICD-10-CM

## 2014-07-29 DIAGNOSIS — I10 Essential (primary) hypertension: Secondary | ICD-10-CM

## 2014-07-29 DIAGNOSIS — R7309 Other abnormal glucose: Secondary | ICD-10-CM

## 2014-07-29 DIAGNOSIS — I493 Ventricular premature depolarization: Secondary | ICD-10-CM

## 2014-07-29 DIAGNOSIS — M5440 Lumbago with sciatica, unspecified side: Secondary | ICD-10-CM

## 2014-07-29 DIAGNOSIS — R5383 Other fatigue: Secondary | ICD-10-CM

## 2014-07-29 LAB — CBC WITH DIFFERENTIAL/PLATELET
BASOS ABS: 0 10*3/uL (ref 0.0–0.1)
Basophils Relative: 0.4 % (ref 0.0–3.0)
EOS ABS: 0.2 10*3/uL (ref 0.0–0.7)
Eosinophils Relative: 2.8 % (ref 0.0–5.0)
HCT: 45.4 % (ref 36.0–46.0)
HEMOGLOBIN: 15.5 g/dL — AB (ref 12.0–15.0)
Lymphocytes Relative: 28 % (ref 12.0–46.0)
Lymphs Abs: 1.8 10*3/uL (ref 0.7–4.0)
MCHC: 34.1 g/dL (ref 30.0–36.0)
MCV: 93.8 fl (ref 78.0–100.0)
Monocytes Absolute: 0.6 10*3/uL (ref 0.1–1.0)
Monocytes Relative: 9.1 % (ref 3.0–12.0)
NEUTROS PCT: 59.7 % (ref 43.0–77.0)
Neutro Abs: 3.8 10*3/uL (ref 1.4–7.7)
Platelets: 238 10*3/uL (ref 150.0–400.0)
RBC: 4.84 Mil/uL (ref 3.87–5.11)
RDW: 13.3 % (ref 11.5–15.5)
WBC: 6.4 10*3/uL (ref 4.0–10.5)

## 2014-07-29 LAB — TSH: TSH: 1.78 u[IU]/mL (ref 0.35–4.50)

## 2014-07-29 LAB — BASIC METABOLIC PANEL
BUN: 12 mg/dL (ref 6–23)
CHLORIDE: 102 meq/L (ref 96–112)
CO2: 32 mEq/L (ref 19–32)
Calcium: 9.9 mg/dL (ref 8.4–10.5)
Creatinine, Ser: 0.62 mg/dL (ref 0.40–1.20)
GFR: 98.66 mL/min (ref >60.00)
Glucose, Bld: 114 mg/dL — ABNORMAL HIGH (ref 70–99)
Potassium: 4.6 mEq/L (ref 3.5–5.1)
SODIUM: 139 meq/L (ref 135–145)

## 2014-07-29 LAB — HEPATIC FUNCTION PANEL
ALT: 20 U/L (ref 0–35)
AST: 19 U/L (ref 0–37)
Albumin: 4.1 g/dL (ref 3.5–5.2)
Alkaline Phosphatase: 78 U/L (ref 39–117)
BILIRUBIN DIRECT: 0.1 mg/dL (ref 0.0–0.3)
Total Bilirubin: 0.4 mg/dL (ref 0.2–1.2)
Total Protein: 7.5 g/dL (ref 6.0–8.3)

## 2014-07-29 LAB — LIPID PANEL
CHOL/HDL RATIO: 3
Cholesterol: 169 mg/dL (ref 0–200)
HDL: 48.8 mg/dL (ref >39.00)
LDL CALC: 94 mg/dL (ref 0–99)
NONHDL: 120.2
Triglycerides: 130 mg/dL (ref 0.0–149.0)
VLDL: 26 mg/dL (ref 0.0–40.0)

## 2014-07-29 MED ORDER — BUMETANIDE 0.5 MG PO TABS: 0.5000 mg | ORAL_TABLET | Freq: Every day | ORAL | Status: DC

## 2014-07-29 NOTE — Assessment & Plan Note (Signed)
Doing well 

## 2014-07-29 NOTE — Progress Notes (Signed)
Subjective:   No CP relapse - s/p cholecystectomy C/o painful feet  HPI She had no more "gas" attacks  The patient presents for a follow-up of  chronic hypertension, chronic dyslipidemia, elev glu, tachycardia  BP 104/60 mmHg  Pulse 94  Temp(Src) 98.5 F (36.9 C) (Oral)  Wt 176 lb (79.833 kg)  SpO2 97%    Review of Systems  Constitutional: Negative for chills, activity change, appetite change, fatigue and unexpected weight change.  HENT: Negative for congestion, mouth sores and sinus pressure.   Eyes: Negative for visual disturbance.  Respiratory: Negative for chest tightness.   Genitourinary: Negative for frequency, difficulty urinating and vaginal pain.  Musculoskeletal: Negative for gait problem.  Skin: Negative for pallor and rash.  Neurological: Negative for tremors.  Psychiatric/Behavioral: Negative for confusion and sleep disturbance.   Wt Readings from Last 3 Encounters:  07/29/14 176 lb (79.833 kg)  04/28/14 179 lb (81.194 kg)  03/18/14 186 lb (84.369 kg)   BP Readings from Last 3 Encounters:  07/29/14 104/60  06/16/14 164/99  04/28/14 140/82       Objective:   Physical Exam  Constitutional: She appears well-developed. No distress.  HENT:  Head: Normocephalic.  Right Ear: External ear normal.  Left Ear: External ear normal.  Nose: Nose normal.  Mouth/Throat: Oropharynx is clear and moist.  Eyes: Conjunctivae are normal. Pupils are equal, round, and reactive to light. Right eye exhibits no discharge. Left eye exhibits no discharge.  Neck: Normal range of motion. Neck supple. No JVD present. No tracheal deviation present. No thyromegaly present.  Cardiovascular: Normal rate, regular rhythm and normal heart sounds.   Pulmonary/Chest: No stridor. No respiratory distress. She has no wheezes.  Abdominal: Soft. Bowel sounds are normal. She exhibits no distension and no mass. There is no tenderness. There is no rebound and no guarding.  Musculoskeletal:  She exhibits no edema or tenderness.  Lymphadenopathy:    She has no cervical adenopathy.  Neurological: She displays normal reflexes. No cranial nerve deficit. She exhibits normal muscle tone. Coordination normal.  Skin: No rash noted. No erythema.  Psychiatric: She has a normal mood and affect. Her behavior is normal. Judgment and thought content normal.  scars are healed on abd Feet are sensitive/OA    Lab Results  Component Value Date   WBC 6.7 03/26/2014   HGB 15.3* 03/26/2014   HCT 45.4 03/26/2014   PLT 192 03/26/2014   GLUCOSE 95 03/26/2014   CHOL 192 03/10/2010   TRIG 124.0 03/10/2010   HDL 46.70 03/10/2010   LDLDIRECT 139.3 02/21/2008   LDLCALC 121* 03/10/2010   ALT 19 03/26/2014   AST 18 03/26/2014   NA 137 03/26/2014   K 4.2 03/26/2014   CL 99 03/26/2014   CREATININE 0.54 03/26/2014   BUN 12 03/26/2014   CO2 25 03/26/2014   TSH 1.40 06/25/2013   HGBA1C 5.6 06/25/2013   MICROALBUR 0.4 06/25/2008     EXAM:  ULTRASOUND ABDOMEN COMPLETE 02/04/14 COMPARISON: Ultrasound of March 09, 2008.  FINDINGS:  Gallbladder:  Large gallstone measuring 1.7 cm is noted. No significant  gallbladder wall thickening or pericholecystic fluid is noted. No  sonographic Murphy's sign is noted.  Common bile duct:  Diameter: Measures 6.3 mm distally which is within normal limits.  Liver:  Increased echogenicity of hepatic parenchyma is noted consistent  with fatty infiltration. 7 mm cyst is seen anteriorly in right  hepatic lobe. 2.4 cm cyst is noted posteriorly in the right hepatic  lobe.  IVC:  No abnormality visualized.  Pancreas:  Visualized portion unremarkable.  Spleen:  Size and appearance within normal limits.  Right Kidney:  Length: 11.5 cm. Echogenicity within normal limits. No mass or  hydronephrosis visualized.  Left Kidney:  Length: 12.1 cm. Echogenicity within normal limits. No mass or  hydronephrosis visualized.  Abdominal aorta:  No aneurysm visualized.   Other findings:  None.  IMPRESSION:  Fatty infiltration of the liver is noted.  Large solitary gallstone is noted without evidence of cholecystitis.  Electronically Signed  By: Sabino Dick M.D.  On: 02/04/2014 10:00  ECHO 02/05/14 Study Conclusions:  - Left ventricle: The cavity size was normal. Systolic function was normal. The estimated ejection fraction was in the range of 55% to 60%. Wall motion was normal; there were no regional wall motion abnormalities. There was an increased relative contribution of atrial contraction to ventricular filling. Doppler parameters are consistent with abnormal left ventricular relaxation (grade 1 diastolic dysfunction). - Aortic valve: Trileaflet; normal thickness, mildly calcified leaflets. There was mild regurgitation. - Mitral valve: Mild calcification. - Atrial septum: There was increased thickness of the septum, consistent with lipomatous hypertrophy. - Tricuspid valve: There was trivial regurgitation. - Pulmonic valve: There was trivial regurgitation.        Assessment & Plan:  Patient ID: Kathleen Mejia, female   DOB: 06-18-1933, 81 y.o.   MRN: [id number]

## 2014-07-29 NOTE — Progress Notes (Signed)
Pre visit review using our clinic review tool, if applicable. No additional management support is needed unless otherwise documented below in the visit note. 

## 2014-07-29 NOTE — Assessment & Plan Note (Signed)
Better after surgery and after a wt loss

## 2014-07-29 NOTE — Assessment & Plan Note (Signed)
Insurance wants pt to switch from Aldactozide - too expensive. Will change to Spironolactone

## 2014-07-29 NOTE — Assessment & Plan Note (Signed)
Baby ASA prn

## 2014-07-29 NOTE — Assessment & Plan Note (Signed)
Pt lost 10 lbs 

## 2014-08-06 ENCOUNTER — Ambulatory Visit: Payer: Self-pay

## 2014-08-06 ENCOUNTER — Ambulatory Visit
Admission: RE | Admit: 2014-08-06 | Discharge: 2014-08-06 | Disposition: A | Discharge status: Home / Self Care | Payer: Medicare Other | Source: Ambulatory Visit

## 2014-08-06 DIAGNOSIS — Z1231 Encounter for screening mammogram for malignant neoplasm of breast: Secondary | ICD-10-CM

## 2014-09-23 ENCOUNTER — Telehealth: Payer: Self-pay

## 2014-09-23 NOTE — Telephone Encounter (Signed)
Patient called to educate on Medicare Wellness apt. LVM for the patient to call back to educate and schedule for wellness visit.   Message was most likely with son [name]) in demographic field.

## 2014-11-27 ENCOUNTER — Other Ambulatory Visit (INDEPENDENT_AMBULATORY_CARE_PROVIDER_SITE_OTHER): Payer: Medicare Other

## 2014-11-27 ENCOUNTER — Encounter: Payer: Self-pay | Admitting: Internal Medicine

## 2014-11-27 ENCOUNTER — Ambulatory Visit (INDEPENDENT_AMBULATORY_CARE_PROVIDER_SITE_OTHER): Payer: Medicare Other | Admitting: Internal Medicine

## 2014-11-27 VITALS — BP 130/64 | HR 88 | Wt 180.0 lb

## 2014-11-27 DIAGNOSIS — I10 Essential (primary) hypertension: Secondary | ICD-10-CM

## 2014-11-27 DIAGNOSIS — R7309 Other abnormal glucose: Secondary | ICD-10-CM

## 2014-11-27 DIAGNOSIS — R609 Edema, unspecified: Secondary | ICD-10-CM | POA: Diagnosis not present

## 2014-11-27 LAB — BASIC METABOLIC PANEL
BUN: 15 mg/dL (ref 6–23)
CALCIUM: 9.5 mg/dL (ref 8.4–10.5)
CO2: 28 mEq/L (ref 19–32)
Chloride: 102 mEq/L (ref 96–112)
Creatinine, Ser: 0.6 mg/dL (ref 0.40–1.20)
GFR: 102.38 mL/min (ref >60.00)
Glucose, Bld: 105 mg/dL — ABNORMAL HIGH (ref 70–99)
Potassium: 4 mEq/L (ref 3.5–5.1)
Sodium: 138 mEq/L (ref 135–145)

## 2014-11-27 LAB — CBC WITH DIFFERENTIAL/PLATELET
Basophils Absolute: 0 10*3/uL (ref 0.0–0.1)
Basophils Relative: 0.4 % (ref 0.0–3.0)
Eosinophils Absolute: 0.2 10*3/uL (ref 0.0–0.7)
Eosinophils Relative: 2.3 % (ref 0.0–5.0)
HEMATOCRIT: 46.8 % — AB (ref 36.0–46.0)
HEMOGLOBIN: 15.9 g/dL — AB (ref 12.0–15.0)
LYMPHS ABS: 1.7 10*3/uL (ref 0.7–4.0)
Lymphocytes Relative: 23.2 % (ref 12.0–46.0)
MCHC: 33.9 g/dL (ref 30.0–36.0)
MCV: 94.6 fl (ref 78.0–100.0)
MONOS PCT: 8.1 % (ref 3.0–12.0)
Monocytes Absolute: 0.6 10*3/uL (ref 0.1–1.0)
NEUTROS ABS: 4.8 10*3/uL (ref 1.4–7.7)
Neutrophils Relative %: 66 % (ref 43.0–77.0)
Platelets: 227 10*3/uL (ref 150.0–400.0)
RBC: 4.95 Mil/uL (ref 3.87–5.11)
RDW: 13.2 % (ref 11.5–15.5)
WBC: 7.3 10*3/uL (ref 4.0–10.5)

## 2014-11-27 LAB — LIPID PANEL
CHOL/HDL RATIO: 3
CHOLESTEROL: 175 mg/dL (ref 0–200)
HDL: 50.4 mg/dL (ref >39.00)
LDL Cholesterol: 104 mg/dL — ABNORMAL HIGH (ref 0–99)
NonHDL: 124.6
TRIGLYCERIDES: 103 mg/dL (ref 0.0–149.0)
VLDL: 20.6 mg/dL (ref 0.0–40.0)

## 2014-11-27 LAB — HEPATIC FUNCTION PANEL
ALT: 19 U/L (ref 0–35)
AST: 17 U/L (ref 0–37)
Albumin: 4 g/dL (ref 3.5–5.2)
Alkaline Phosphatase: 72 U/L (ref 39–117)
BILIRUBIN TOTAL: 0.4 mg/dL (ref 0.2–1.2)
Bilirubin, Direct: 0.1 mg/dL (ref 0.0–0.3)
TOTAL PROTEIN: 7.4 g/dL (ref 6.0–8.3)

## 2014-11-27 LAB — HEMOGLOBIN A1C: Hgb A1c MFr Bld: 5.4 % (ref 4.6–6.5)

## 2014-11-27 NOTE — Assessment & Plan Note (Signed)
Monitoring

## 2014-11-27 NOTE — Progress Notes (Signed)
Pre visit review using our clinic review tool, if applicable. No additional management support is needed unless otherwise documented below in the visit note. 

## 2014-11-27 NOTE — Progress Notes (Signed)
Subjective:   No CP relapse - s/p cholecystectomy F/u HTN  HPI   She had no more "gas" attacks  The patient presents for a follow-up of  chronic hypertension, chronic dyslipidemia, elev glu, tachycardia  BP 130/64 mmHg  Pulse 88  Wt 180 lb (81.647 kg)  SpO2 96%    Review of Systems  Constitutional: Negative for chills, activity change, appetite change, fatigue and unexpected weight change.  HENT: Negative for congestion, mouth sores and sinus pressure.   Eyes: Negative for visual disturbance.  Respiratory: Negative for chest tightness.   Genitourinary: Negative for frequency, difficulty urinating and vaginal pain.  Musculoskeletal: Negative for gait problem.  Skin: Negative for pallor and rash.  Neurological: Negative for tremors.  Psychiatric/Behavioral: Negative for confusion and sleep disturbance.   Wt Readings from Last 3 Encounters:  11/27/14 180 lb (81.647 kg)  07/29/14 176 lb (79.833 kg)  04/28/14 179 lb (81.194 kg)   BP Readings from Last 3 Encounters:  11/27/14 130/64  07/29/14 104/60  06/16/14 164/99       Objective:   Physical Exam  Constitutional: She appears well-developed. No distress.  HENT:  Head: Normocephalic.  Right Ear: External ear normal.  Left Ear: External ear normal.  Nose: Nose normal.  Mouth/Throat: Oropharynx is clear and moist.  Eyes: Conjunctivae are normal. Pupils are equal, round, and reactive to light. Right eye exhibits no discharge. Left eye exhibits no discharge.  Neck: Normal range of motion. Neck supple. No JVD present. No tracheal deviation present. No thyromegaly present.  Cardiovascular: Normal rate, regular rhythm and normal heart sounds.   Pulmonary/Chest: No stridor. No respiratory distress. She has no wheezes.  Abdominal: Soft. Bowel sounds are normal. She exhibits no distension and no mass. There is no tenderness. There is no rebound and no guarding.  Musculoskeletal: She exhibits no edema or tenderness.   Lymphadenopathy:    She has no cervical adenopathy.  Neurological: She displays normal reflexes. No cranial nerve deficit. She exhibits normal muscle tone. Coordination normal.  Skin: No rash noted. No erythema.  Psychiatric: She has a normal mood and affect. Her behavior is normal. Judgment and thought content normal.  scars are healed on abd Feet are sensitive/OA    Lab Results  Component Value Date   WBC 6.4 07/29/2014   HGB 15.5* 07/29/2014   HCT 45.4 07/29/2014   PLT 238.0 07/29/2014   GLUCOSE 114* 07/29/2014   CHOL 169 07/29/2014   TRIG 130.0 07/29/2014   HDL 48.80 07/29/2014   LDLDIRECT 139.3 02/21/2008   LDLCALC 94 07/29/2014   ALT 20 07/29/2014   AST 19 07/29/2014   NA 139 07/29/2014   K 4.6 07/29/2014   CL 102 07/29/2014   CREATININE 0.62 07/29/2014   BUN 12 07/29/2014   CO2 32 07/29/2014   TSH 1.78 07/29/2014   HGBA1C 5.6 06/25/2013   MICROALBUR 0.4 06/25/2008     EXAM:  ULTRASOUND ABDOMEN COMPLETE 02/04/14 COMPARISON: Ultrasound of March 09, 2008.  FINDINGS:  Gallbladder:  Large gallstone measuring 1.7 cm is noted. No significant  gallbladder wall thickening or pericholecystic fluid is noted. No  sonographic Murphy's sign is noted.  Common bile duct:  Diameter: Measures 6.3 mm distally which is within normal limits.  Liver:  Increased echogenicity of hepatic parenchyma is noted consistent  with fatty infiltration. 7 mm cyst is seen anteriorly in right  hepatic lobe. 2.4 cm cyst is noted posteriorly in the right hepatic  lobe.  IVC:  No abnormality visualized.  Pancreas:  Visualized portion unremarkable.  Spleen:  Size and appearance within normal limits.  Right Kidney:  Length: 11.5 cm. Echogenicity within normal limits. No mass or  hydronephrosis visualized.  Left Kidney:  Length: 12.1 cm. Echogenicity within normal limits. No mass or  hydronephrosis visualized.  Abdominal aorta:  No aneurysm visualized.  Other findings:  None.   IMPRESSION:  Fatty infiltration of the liver is noted.  Large solitary gallstone is noted without evidence of cholecystitis.  Electronically Signed  By: Sabino Dick M.D.  On: 02/04/2014 10:00  ECHO 02/05/14 Study Conclusions:  - Left ventricle: The cavity size was normal. Systolic function was normal. The estimated ejection fraction was in the range of 55% to 60%. Wall motion was normal; there were no regional wall motion abnormalities. There was an increased relative contribution of atrial contraction to ventricular filling. Doppler parameters are consistent with abnormal left ventricular relaxation (grade 1 diastolic dysfunction). - Aortic valve: Trileaflet; normal thickness, mildly calcified leaflets. There was mild regurgitation. - Mitral valve: Mild calcification. - Atrial septum: There was increased thickness of the septum, consistent with lipomatous hypertrophy. - Tricuspid valve: There was trivial regurgitation. - Pulmonic valve: There was trivial regurgitation.        Assessment & Plan:

## 2014-11-27 NOTE — Assessment & Plan Note (Signed)
Ok

## 2014-11-27 NOTE — Assessment & Plan Note (Signed)
4/14 hard to control - high dose Maxzide caused cramps Chronic

## 2014-11-28 ENCOUNTER — Encounter: Payer: Self-pay | Admitting: Internal Medicine

## 2014-11-28 DIAGNOSIS — D582 Other hemoglobinopathies: Secondary | ICD-10-CM | POA: Insufficient documentation

## 2015-03-29 ENCOUNTER — Ambulatory Visit (INDEPENDENT_AMBULATORY_CARE_PROVIDER_SITE_OTHER): Payer: Medicare Other | Admitting: Internal Medicine

## 2015-03-29 ENCOUNTER — Encounter: Payer: Self-pay | Admitting: Internal Medicine

## 2015-03-29 ENCOUNTER — Other Ambulatory Visit (INDEPENDENT_AMBULATORY_CARE_PROVIDER_SITE_OTHER): Payer: Medicare Other

## 2015-03-29 VITALS — BP 146/70 | HR 122 | Wt 182.0 lb

## 2015-03-29 DIAGNOSIS — I480 Paroxysmal atrial fibrillation: Secondary | ICD-10-CM

## 2015-03-29 DIAGNOSIS — E669 Obesity, unspecified: Secondary | ICD-10-CM | POA: Diagnosis not present

## 2015-03-29 DIAGNOSIS — I10 Essential (primary) hypertension: Secondary | ICD-10-CM

## 2015-03-29 DIAGNOSIS — R000 Tachycardia, unspecified: Secondary | ICD-10-CM

## 2015-03-29 DIAGNOSIS — I4891 Unspecified atrial fibrillation: Secondary | ICD-10-CM | POA: Insufficient documentation

## 2015-03-29 LAB — CBC WITH DIFFERENTIAL/PLATELET
Basophils Absolute: 0 10*3/uL (ref 0.0–0.1)
Basophils Relative: 0.5 % (ref 0.0–3.0)
EOS ABS: 0.1 10*3/uL (ref 0.0–0.7)
EOS PCT: 1.8 % (ref 0.0–5.0)
HEMATOCRIT: 50.2 % — AB (ref 36.0–46.0)
Hemoglobin: 16.7 g/dL — ABNORMAL HIGH (ref 12.0–15.0)
Lymphocytes Relative: 31.2 % (ref 12.0–46.0)
Lymphs Abs: 2.1 10*3/uL (ref 0.7–4.0)
MCHC: 33.3 g/dL (ref 30.0–36.0)
MCV: 95.3 fl (ref 78.0–100.0)
MONOS PCT: 9.5 % (ref 3.0–12.0)
Monocytes Absolute: 0.6 10*3/uL (ref 0.1–1.0)
Neutro Abs: 3.8 10*3/uL (ref 1.4–7.7)
Neutrophils Relative %: 57 % (ref 43.0–77.0)
PLATELETS: 210 10*3/uL (ref 150.0–400.0)
RBC: 5.27 Mil/uL — ABNORMAL HIGH (ref 3.87–5.11)
RDW: 12.6 % (ref 11.5–15.5)
WBC: 6.7 10*3/uL (ref 4.0–10.5)

## 2015-03-29 LAB — HEPATIC FUNCTION PANEL
ALBUMIN: 4.3 g/dL (ref 3.5–5.2)
ALT: 18 U/L (ref 0–35)
AST: 17 U/L (ref 0–37)
Alkaline Phosphatase: 77 U/L (ref 39–117)
Bilirubin, Direct: 0.1 mg/dL (ref 0.0–0.3)
Total Bilirubin: 0.5 mg/dL (ref 0.2–1.2)
Total Protein: 7.7 g/dL (ref 6.0–8.3)

## 2015-03-29 LAB — BASIC METABOLIC PANEL
BUN: 12 mg/dL (ref 6–23)
CO2: 31 mEq/L (ref 19–32)
CREATININE: 0.57 mg/dL (ref 0.40–1.20)
Calcium: 9.9 mg/dL (ref 8.4–10.5)
Chloride: 103 mEq/L (ref 96–112)
GFR: 108.53 mL/min (ref >60.00)
Glucose, Bld: 107 mg/dL — ABNORMAL HIGH (ref 70–99)
POTASSIUM: 4 meq/L (ref 3.5–5.1)
Sodium: 141 mEq/L (ref 135–145)

## 2015-03-29 LAB — MAGNESIUM: MAGNESIUM: 2.1 mg/dL (ref 1.5–2.5)

## 2015-03-29 LAB — TSH: TSH: 1.81 u[IU]/mL (ref 0.35–4.50)

## 2015-03-29 MED ORDER — APIXABAN 5 MG PO TABS: 5.0000 mg | ORAL_TABLET | Freq: Two times a day (BID) | ORAL | Status: DC

## 2015-03-29 MED ORDER — METOPROLOL SUCCINATE ER 25 MG PO TB24: 25.0000 mg | ORAL_TABLET | Freq: Every day | ORAL | Status: DC

## 2015-03-29 NOTE — Progress Notes (Signed)
Pre visit review using our clinic review tool, if applicable. No additional management support is needed unless otherwise documented below in the visit note. 

## 2015-03-29 NOTE — Patient Instructions (Addendum)
Call 911 if problems   Apixaban oral tablets What is this medicine? APIXABAN (a PIX a ban) is an anticoagulant (blood thinner). It is used to lower the chance of stroke in people with a medical condition called atrial fibrillation. It is also used to treat or prevent blood clots in the lungs or in the veins. This medicine may be used for other purposes; ask your health care provider or pharmacist if you have questions. What should I tell my health care provider before I take this medicine? They need to know if you have any of these conditions: -bleeding disorders -bleeding in the brain -blood in your stools (black or tarry stools) or if you have blood in your vomit -history of stomach bleeding -kidney disease -liver disease -mechanical heart valve -an unusual or allergic reaction to apixaban, other medicines, foods, dyes, or preservatives -pregnant or trying to get pregnant -breast-feeding How should I use this medicine? Take this medicine by mouth with a glass of water. Follow the directions on the prescription label. You can take it with or without food. If it upsets your stomach, take it with food. Take your medicine at regular intervals. Do not take it more often than directed. Do not stop taking except on your doctor's advice. Stopping this medicine may increase your risk of a blot clot. Be sure to refill your prescription before you run out of medicine. Talk to your pediatrician regarding the use of this medicine in children. Special care may be needed. Overdosage: If you think you have taken too much of this medicine contact a poison control center or emergency room at once. NOTE: This medicine is only for you. Do not share this medicine with others. What if I miss a dose? If you miss a dose, take it as soon as you can. If it is almost time for your next dose, take only that dose. Do not take double or extra doses. What may interact with this medicine? This medicine may interact  with the following: -aspirin and aspirin-like medicines -certain medicines for fungal infections like ketoconazole and itraconazole -certain medicines for seizures like carbamazepine and phenytoin -certain medicines that treat or prevent blood clots like warfarin, enoxaparin, and dalteparin -clarithromycin -NSAIDs, medicines for pain and inflammation, like ibuprofen or naproxen -rifampin -ritonavir -St. John's wort This list may not describe all possible interactions. Give your health care provider a list of all the medicines, herbs, non-prescription drugs, or dietary supplements you use. Also tell them if you smoke, drink alcohol, or use illegal drugs. Some items may interact with your medicine. What should I watch for while using this medicine? Notify your doctor or health care professional and seek emergency treatment if you develop breathing problems; changes in vision; chest pain; severe, sudden headache; pain, swelling, warmth in the leg; trouble speaking; sudden numbness or weakness of the face, arm, or leg. These can be signs that your condition has gotten worse. If you are going to have surgery, tell your doctor or health care professional that you are taking this medicine. Tell your health care professional that you use this medicine before you have a spinal or epidural procedure. Sometimes people who take this medicine have bleeding problems around the spine when they have a spinal or epidural procedure. This bleeding is very rare. If you have a spinal or epidural procedure while on this medicine, call your health care professional immediately if you have back pain, numbness or tingling (especially in your legs and feet), muscle weakness, paralysis,  or loss of bladder or bowel control. Avoid sports and activities that might cause injury while you are using this medicine. Severe falls or injuries can cause unseen bleeding. Be careful when using sharp tools or knives. Consider using an  Copy. Take special care brushing or flossing your teeth. Report any injuries, bruising, or red spots on the skin to your doctor or health care professional. What side effects may I notice from receiving this medicine? Side effects that you should report to your doctor or health care professional as soon as possible: -allergic reactions like skin rash, itching or hives, swelling of the face, lips, or tongue -signs and symptoms of bleeding such as bloody or black, tarry stools; red or dark-brown urine; spitting up blood or brown material that looks like coffee grounds; red spots on the skin; unusual bruising or bleeding from the eye, gums, or nose This list may not describe all possible side effects. Call your doctor for medical advice about side effects. You may report side effects to FDA at 1-800-FDA-1088. Where should I keep my medicine? Keep out of the reach of children. Store at room temperature between 20 and 25 degrees C (68 and 77 degrees F). Throw away any unused medicine after the expiration date. NOTE: This sheet is a summary. It may not cover all possible information. If you have questions about this medicine, talk to your doctor, pharmacist, or health care provider.    2016, Elsevier/Gold Standard. (2013-01-24 11:59:24)   Atrial Flutter Atrial flutter is a heart rhythm that can cause the heart to beat very fast (tachycardia). It originates in the upper chambers of the heart (atria). In atrial flutter, the top chambers of the heart (atria) often beat much faster than the bottom chambers of the heart (ventricles). Atrial flutter has a regular "saw toothed" appearance in an EKG readout. An EKG is a test that records the electrical activity of the heart. Atrial flutter can cause the heart to beat up to 150 beats per minute (BPM). Atrial flutter can either be short lived (paroxysmal) or permanent.  CAUSES  Causes of atrial flutter can be many. Some of these include:  Heart related  issues:  Heart attack (myocardial infarction).  Heart failure.  Heart valve problems.  Poorly controlled high blood pressure (hypertension).  After open heart surgery.  Lung related issues:  A blood clot in the lungs (pulmonary embolism).  Chronic obstructive pulmonary disease (COPD). Medications used to treat COPD can attribute to atrial flutter.  Other related causes:  Hyperthyroidism.  Caffeine.  Some decongestant cold medications.  Low electrolyte levels such as potassium or magnesium.  Cocaine. SYMPTOMS  An awareness of your heart beating rapidly (palpitations).  Shortness of breath.  Chest pain.  Low blood pressure (hypotension).  Dizziness or fainting. DIAGNOSIS  Different tests can be performed to diagnose atrial flutter.   An EKG.  Holter monitor. This is a 24-hour recording of your heart rhythm. You will also be given a diary. Write down all symptoms that you have and what you were doing at the time you experienced symptoms.  Cardiac event monitor. This small device can be worn for up to 30 days. When you have heart symptoms, you will push a button on the device. This will then record your heart rhythm.  Echocardiogram. This is an imaging test to look at your heart. Your caregiver will look at your heart valves and the ventricles.  Stress test. This test can help determine if the atrial flutter is  related to exercise or if coronary artery disease is present.  Laboratory studies will look at certain blood levels like:  Complete blood count (CBC).  Potassium.  Magnesium.  Thyroid function. TREATMENT  Treatment of atrial flutter varies. A combination of therapies may be used or sometimes atrial flutter may need only 1 type of treatment.  Lab work: If your blood work, such as your electrolytes (potassium, magnesium) or your thyroid function tests, are abnormal, your caregiver will treat them accordingly.  Medication:  There are several  different types of medications that can convert your heart to a normal rhythm and prevent atrial flutter from reoccurring.  Nonsurgical procedures: Nonsurgical techniques may be used to control atrial flutter. Some examples include:  Cardioversion. This technique uses either drugs or an electrical shock to restore a normal heart rhythm:  Cardioversion drugs may be given through an intravenous (IV) line to help "reset" the heart rhythm.  In electrical cardioversion, your caregiver shocks your heart with electrical energy. This helps to reset the heartbeat to a normal rhythm.  Ablation. If atrial flutter is a persistent problem, an ablation may be needed. This procedure is done under mild sedation. High frequency radio-wave energy is used to destroy the area of heart tissue responsible for atrial flutter. SEEK IMMEDIATE MEDICAL CARE IF:  You have:  Dizziness.  Near fainting or fainting.  Shortness of breath.  Chest pain or pressure.  Sudden nausea or vomiting.  Profuse sweating. If you have the above symptoms, call your local emergency service immediately! Do not drive yourself to the hospital. MAKE SURE YOU:   Understand these instructions.  Will watch your condition.  Will get help right away if you are not doing well or get worse.   This information is not intended to replace advice given to you by your health care provider. Make sure you discuss any questions you have with your health care provider.   Document Released: 10/08/2008 Document Revised: 06/12/2014 Document Reviewed: 12/04/2014 Elsevier Interactive Patient Education [employer].

## 2015-03-29 NOTE — Assessment & Plan Note (Addendum)
A new onset - likely PAF Discussed Start Toprol and Eliquis Card ref EKG - A fib/flutter   Potential benefits of a long term Eliquis use as well as potential risks  and complications were explained to the patient and were aknowledged.

## 2015-03-29 NOTE — Assessment & Plan Note (Signed)
Added Toprol

## 2015-03-29 NOTE — Progress Notes (Signed)
Subjective:  Patient ID: Kathleen Mejia, female    DOB: Jun 20, 1933  Age: 81 y.o. MRN: [id number]  CC: No chief complaint on file.   HPI Melia Liebler Liverman presents for HTN, OA, obesity f/u. C/o rapid HR in am x 2-3d for 1 h or so. No CP, no SOB. No LOC or dizziness  Outpatient Prescriptions Prior to Visit  Medication Sig Dispense Refill  . Ascorbic Acid (VITAMIN C PO) Take 1 tablet by mouth daily.    [name] b complex vitamins tablet Take 1 tablet by mouth daily.    . bumetanide (BUMEX) 0.5 MG tablet Take 1 tablet (0.5 mg total) by mouth daily. 90 tablet 3  . Coenzyme Q10 (CO Q 10 PO) Take 1 capsule by mouth daily.    . fish oil-omega-3 fatty acids 1000 MG capsule Take 2 g by mouth 2 (two) times daily.      . Multiple Vitamin (MULITIVITAMIN WITH MINERALS) TABS Take 1 tablet by mouth daily.    [name] telmisartan (MICARDIS) 40 MG tablet Take 1 tablet (40 mg total) by mouth daily. 90 tablet 3   No facility-administered medications prior to visit.    ROS Review of Systems  Constitutional: Negative for chills, activity change, appetite change, fatigue and unexpected weight change.  HENT: Negative for congestion, mouth sores and sinus pressure.   Eyes: Negative for visual disturbance.  Respiratory: Negative for cough and chest tightness.   Cardiovascular: Positive for palpitations. Negative for chest pain.  Gastrointestinal: Negative for nausea and abdominal pain.  Genitourinary: Negative for frequency, difficulty urinating and vaginal pain.  Musculoskeletal: Negative for back pain and gait problem.  Skin: Negative for pallor and rash.  Neurological: Negative for dizziness, tremors, weakness, numbness and headaches.  Psychiatric/Behavioral: Negative for confusion and sleep disturbance. The patient is not nervous/anxious.     Objective:  BP 146/70 mmHg  Pulse 122  Wt 182 lb (82.555 kg)  SpO2 97%  BP Readings from Last 3 Encounters:  03/29/15 146/70  11/27/14 130/64  07/29/14 104/60    Wt  Readings from Last 3 Encounters:  03/29/15 182 lb (82.555 kg)  11/27/14 180 lb (81.647 kg)  07/29/14 176 lb (79.833 kg)    Physical Exam  Constitutional: She appears well-developed. No distress.  HENT:  Head: Normocephalic.  Right Ear: External ear normal.  Left Ear: External ear normal.  Nose: Nose normal.  Mouth/Throat: Oropharynx is clear and moist.  Eyes: Conjunctivae are normal. Pupils are equal, round, and reactive to light. Right eye exhibits no discharge. Left eye exhibits no discharge.  Neck: Normal range of motion. Neck supple. No JVD present. No tracheal deviation present. No thyromegaly present.  Cardiovascular: Exam reveals no gallop.   No murmur heard. Pulmonary/Chest: No stridor. No respiratory distress. She has no wheezes.  Abdominal: Soft. Bowel sounds are normal. She exhibits no distension and no mass. There is no tenderness. There is no rebound and no guarding.  Musculoskeletal: She exhibits no edema or tenderness.  Lymphadenopathy:    She has no cervical adenopathy.  Neurological: She displays normal reflexes. No cranial nerve deficit. She exhibits normal muscle tone. Coordination normal.  Skin: No rash noted. No erythema.  Psychiatric: She has a normal mood and affect. Her behavior is normal. Judgment and thought content normal.  Irreg irreg tachycardia  Lab Results  Component Value Date   WBC 7.3 11/27/2014   HGB 15.9* 11/27/2014   HCT 46.8* 11/27/2014   PLT 227.0 11/27/2014   GLUCOSE 105* 11/27/2014  CHOL 175 11/27/2014   TRIG 103.0 11/27/2014   HDL 50.40 11/27/2014   LDLDIRECT 139.3 02/21/2008   LDLCALC 104* 11/27/2014   ALT 19 11/27/2014   AST 17 11/27/2014   NA 138 11/27/2014   K 4.0 11/27/2014   CL 102 11/27/2014   CREATININE 0.60 11/27/2014   BUN 15 11/27/2014   CO2 28 11/27/2014   TSH 1.78 07/29/2014   HGBA1C 5.4 11/27/2014   MICROALBUR 0.4 06/25/2008    Mm Digital Screening Bilateral  08/07/2014  CLINICAL DATA:  Screening. EXAM:  DIGITAL SCREENING BILATERAL MAMMOGRAM WITH CAD COMPARISON:  Previous exam(s). ACR Breast Density Category b: There are scattered areas of fibroglandular density. FINDINGS: There are no findings suspicious for malignancy. Images were processed with CAD. IMPRESSION: No mammographic evidence of malignancy. A result letter of this screening mammogram will be mailed directly to the patient. RECOMMENDATION: Screening mammogram in one year. (Code:SM-B-01Y) BI-RADS CATEGORY  1: Negative. Electronically Signed   By: Lovey Newcomer M.D.   On: 08/07/2014 10:51   EKG - A fib/flutter   Assessment & Plan:   There are no diagnoses linked to this encounter. I am having Ms. Chatwin maintain her fish oil-omega-3 fatty acids, Ascorbic Acid (VITAMIN C PO), b complex vitamins, Coenzyme Q10 (CO Q 10 PO), multivitamin with minerals, telmisartan, and bumetanide.  No orders of the defined types were placed in this encounter.     Follow-up: No Follow-up on file.  Walker Kehr, MD

## 2015-03-29 NOTE — Assessment & Plan Note (Signed)
Discussed low-carb diet 

## 2015-03-31 NOTE — Progress Notes (Signed)
Cardiology Office Note   Date:  04/01/2015   ID:  JAHNYA STOERMER, DOB [birth year], MRN [id number]  PCP:  Walker Kehr, MD  Cardiologist:  Dr. K. Mali Hilty   Electrophysiologist:  n/a  Chief Complaint  Patient presents with  . Atrial Fibrillation     History of Present Illness: Kathleen Mejia is a 81 y.o. female with a hx of HTN, HL, fatty liver. Evaluated by Dr. Debara Pickett 9/15 for chest pain and palpitations. Echocardiogram demonstrated normal LV function with mild diastolic dysfunction. Nuclear stress test demonstrated anteroapical defect (mild anteroapical ischemia versus shifting breast attenuation artifact). This was felt to be low risk in no further workup was recommended.  Evaluated by primary care 03/29/15 for rapid heartbeat. ECG demonstrated what appears to be atypical atrial flutter with 2:1 conduction, HR 140. She was placed on Eliquis for anticoagulation and metoprolol succinate for rate control. She returns for further evaluation.  She tells me that she was having some rapid palpitations prior to her visit with primary care. These persisted for about a day after. She has felt well since. She denies any chest pain. She really denies any significant dyspnea. She denies orthopnea, PND or edema. She denies syncope. She denies any history of bleeding issues.   Studies/Reports Reviewed Today:  Myoview 10/15 Overall Impression: Low risk stress nuclear study with a small area of mild anteroapical ischemia (versus shifting breast attenuation artifact)..  Echo 9/15 EF 55-60%, normal wall motion, grade 1 diastolic dysfunction, mild AI, MAC, trivial TR, trivial PI, atrial septal lipomatous hypertrophy    Past Medical History  Diagnosis Date  . HTN (hypertension)   . LBP (low back pain)   . Hyperlipidemia   . Fatty liver   . Gallstone     1.5 cm  . Osteoarthritis   . Rosacea   . Dislocated shoulder 2012    right  . Shortness of breath     with activities and exertion     Past Surgical History  Procedure Laterality Date  . Tonsillectomy    . Cholecystectomy N/A 03/27/2014    Procedure: LAPAROSCOPIC CHOLECYSTECTOMY WITH INTRAOPERATIVE CHOLANGIOGRAM;  Surgeon: Fanny Skates, MD;  Location: [facility];  Service: General;  Laterality: N/A;     Current Outpatient Prescriptions  Medication Sig Dispense Refill  . apixaban (ELIQUIS) 5 MG TABS tablet Take 1 tablet (5 mg total) by mouth 2 (two) times daily. 60 tablet 11  . Ascorbic Acid (VITAMIN C PO) Take 1 tablet by mouth daily.    [name] b complex vitamins tablet Take 1 tablet by mouth daily.    . bumetanide (BUMEX) 0.5 MG tablet Take 1 tablet (0.5 mg total) by mouth daily. 90 tablet 3  . Coenzyme Q10 (CO Q 10 PO) Take 1 capsule by mouth daily.    . fish oil-omega-3 fatty acids 1000 MG capsule Take 2 g by mouth 2 (two) times daily.      [name] FLAXSEED, LINSEED, PO Take by mouth daily. PATIENT SPRINKLE FLAX SEEDS ONTO HER CEREAL DAILY    . metoprolol succinate (TOPROL-XL) 25 MG 24 hr tablet Take 1 tablet (25 mg total) by mouth daily. 100 tablet 3  . Multiple Vitamin (MULITIVITAMIN WITH MINERALS) TABS Take 1 tablet by mouth daily.    [name] telmisartan (MICARDIS) 40 MG tablet Take 1 tablet (40 mg total) by mouth daily. 90 tablet 3  . Vitamin D, Ergocalciferol, (DRISDOL) 50000 UNITS CAPS capsule Take 50,000 Units by mouth daily.     No  current facility-administered medications for this visit.    Allergies:   Maxzide    Social History:   Social History   Social History  . Marital Status: Widowed    Spouse Name: N/A  . Number of Children: N/A  . Years of Education: N/A   Occupational History  . Retired    Social History Main Topics  . Smoking status: Never Smoker   . Smokeless tobacco: None  . Alcohol Use: No  . Drug Use: No  . Sexual Activity: Not Asked   Other Topics Concern  . None   Social History Narrative   Not taking vaccines           Family History:   Family History  Problem Relation Age of  Onset  . Pancreatic cancer Mother   . Hypertension Mother       ROS:   Please see the history of present illness.   Review of Systems  All other systems reviewed and are negative.     PHYSICAL EXAM: VS:  BP 128/70 mmHg  Pulse 65  Ht 5\' 1"  (1.549 m)  Wt 183 lb 6.4 oz (83.19 kg)  BMI 34.67 kg/m2    Wt Readings from Last 3 Encounters:  04/01/15 183 lb 6.4 oz (83.19 kg)  03/29/15 182 lb (82.555 kg)  11/27/14 180 lb (81.647 kg)     GEN: Well nourished, well developed, in no acute distress HEENT: normal Neck: no JVD, no carotid bruits, no masses Cardiac:  Normal S1/S2, RRR; no murmur ,  no rubs or gallops, no edema   Respiratory:  clear to auscultation bilaterally, no wheezing, rhonchi or rales. GI: soft, nontender, nondistended, + BS MS: no deformity or atrophy Skin: warm and dry  Neuro:  CNs II-XII intact, Strength and sensation are intact Psych: Normal affect   EKG:  EKG is ordered today.  It demonstrates:   NSR, HR 65, LAD, QTc 424 ms   Recent Labs: 03/29/2015: ALT 18; BUN 12; Creatinine, Ser 0.57; Hemoglobin 16.7*; Magnesium 2.1; Platelets 210.0; Potassium 4.0; Sodium 141; TSH 1.81    Lipid Panel    Component Value Date/Time   CHOL 175 11/27/2014 0819   TRIG 103.0 11/27/2014 0819   HDL 50.40 11/27/2014 0819   CHOLHDL 3 11/27/2014 0819   VLDL 20.6 11/27/2014 0819   LDLCALC 104* 11/27/2014 0819   LDLDIRECT 139.3 02/21/2008 0740      ASSESSMENT AND PLAN:  1. Atrial Flutter: Patient has evidence of paroxysmal atrial flutter. TSH normal. When seen at primary care several days ago she had RVR. She's now on metoprolol succinate. She's back in normal sinus rhythm.CHADS2-VASc= 4. She would benefit from long-term anticoagulation. She has already been placed on Eliquis. She is less than 30 years old, her creatinine is less than 1.5 and her weight is greater than 60 kg. She should be on 5 mg twice a day. I will arrange a 30 day event monitor to assess for arrhythmia  burden. If she has frequent recurrent atrial flutter with RVR, we may need to consider antiarrhythmic drug therapy. Given her age, I would probably opt for amiodarone initially. Atrial flutter appears to be atypical. I am not certain that this would be a rhythm amenable to ablation.  2. Hypertension: Controlled.    Medication Changes: Current medicines are reviewed at length with the patient today.  Concerns regarding medicines are as outlined above.  The following changes have been made:   Discontinued Medications   No medications on file  Modified Medications   No medications on file   New Prescriptions   No medications on file   Labs/ tests ordered today include:   Orders Placed This Encounter  Procedures  . Basic Metabolic Panel (BMET)  . CBC w/Diff  . Cardiac event monitor  . EKG 12-Lead      Disposition:    FU with Dr. Raliegh Ip. Mali Hilty 6-8 weeks.     Signed, Versie Starks, MHS 04/01/2015 5:26 PM    [facility] [place], [place],   [address] Phone: [phone]; Fax: [phone]

## 2015-04-01 ENCOUNTER — Ambulatory Visit: Payer: Medicare Other | Admitting: Physician Assistant

## 2015-04-01 ENCOUNTER — Ambulatory Visit (INDEPENDENT_AMBULATORY_CARE_PROVIDER_SITE_OTHER): Payer: Medicare Other | Admitting: Physician Assistant

## 2015-04-01 ENCOUNTER — Encounter: Payer: Self-pay | Admitting: Physician Assistant

## 2015-04-01 VITALS — BP 128/70 | HR 65 | Ht 61.0 in | Wt 183.4 lb

## 2015-04-01 DIAGNOSIS — I484 Atypical atrial flutter: Secondary | ICD-10-CM

## 2015-04-01 DIAGNOSIS — R079 Chest pain, unspecified: Secondary | ICD-10-CM | POA: Diagnosis not present

## 2015-04-01 DIAGNOSIS — I10 Essential (primary) hypertension: Secondary | ICD-10-CM

## 2015-04-01 NOTE — Patient Instructions (Signed)
Medication Instructions:  1. Your physician recommends that you continue on your current medications as directed. Please refer to the Current Medication list given to you today.   Labwork: 6 WEEKS BMET, CBC W/DIFF  Testing/Procedures: Your physician has recommended that you wear an event monitor. Event monitors are medical devices that record the heart's electrical activity. Doctors most often [place] these monitors to diagnose arrhythmias. Arrhythmias are problems with the speed or rhythm of the heartbeat. The monitor is a small, portable device. You can wear one while you do your normal daily activities. This is usually used to diagnose what is causing palpitations/syncope (passing out).  Follow-Up: 1. 6-8 WEEKS WITH DR. HILTY  2. YOU WILL NEED TO EST WITH THE COUMADIN CLINIC IN 6 WEEKS ; NEW START ELIQUIS  Any Other Special Instructions Will Be Listed Below (If Applicable).     If you need a refill on your cardiac medications before your next appointment, please call your pharmacy.

## 2015-04-06 ENCOUNTER — Encounter: Payer: Self-pay | Admitting: Internal Medicine

## 2015-04-06 ENCOUNTER — Ambulatory Visit (INDEPENDENT_AMBULATORY_CARE_PROVIDER_SITE_OTHER): Payer: Medicare Other | Admitting: Internal Medicine

## 2015-04-06 VITALS — BP 138/80 | HR 62 | Wt 183.0 lb

## 2015-04-06 DIAGNOSIS — I10 Essential (primary) hypertension: Secondary | ICD-10-CM | POA: Diagnosis not present

## 2015-04-06 DIAGNOSIS — R0609 Other forms of dyspnea: Secondary | ICD-10-CM | POA: Diagnosis not present

## 2015-04-06 DIAGNOSIS — R000 Tachycardia, unspecified: Secondary | ICD-10-CM

## 2015-04-06 DIAGNOSIS — I480 Paroxysmal atrial fibrillation: Secondary | ICD-10-CM

## 2015-04-06 DIAGNOSIS — R0600 Dyspnea, unspecified: Secondary | ICD-10-CM

## 2015-04-06 MED ORDER — METOPROLOL SUCCINATE ER 25 MG PO TB24: 12.5000 mg | ORAL_TABLET | Freq: Every day | ORAL | Status: DC

## 2015-04-06 NOTE — Assessment & Plan Note (Signed)
Better  

## 2015-04-06 NOTE — Assessment & Plan Note (Signed)
Resolved on Toprol

## 2015-04-06 NOTE — Assessment & Plan Note (Addendum)
Chronic Bumex, Metoprolol - take 1/2 BP, HR is good now

## 2015-04-06 NOTE — Progress Notes (Signed)
Subjective:  Patient ID: Kathleen Mejia, female    DOB: [birth year]  Age: 81 y.o. MRN: [id number]  CC: No chief complaint on file.   HPI Kathleen Mejia presents for A. Fib, HTN, anticoagulation f/u. Metoprolol is making the pt "spacy" a little.  Outpatient Prescriptions Prior to Visit  Medication Sig Dispense Refill  . apixaban (ELIQUIS) 5 MG TABS tablet Take 1 tablet (5 mg total) by mouth 2 (two) times daily. 60 tablet 11  . Ascorbic Acid (VITAMIN C PO) Take 1 tablet by mouth daily.    [name] b complex vitamins tablet Take 1 tablet by mouth daily.    . bumetanide (BUMEX) 0.5 MG tablet Take 1 tablet (0.5 mg total) by mouth daily. 90 tablet 3  . Coenzyme Q10 (CO Q 10 PO) Take 1 capsule by mouth daily.    . fish oil-omega-3 fatty acids 1000 MG capsule Take 2 g by mouth 2 (two) times daily.      [name] FLAXSEED, LINSEED, PO Take by mouth daily. PATIENT SPRINKLE FLAX SEEDS ONTO HER CEREAL DAILY    . Multiple Vitamin (MULITIVITAMIN WITH MINERALS) TABS Take 1 tablet by mouth daily.    [name] telmisartan (MICARDIS) 40 MG tablet Take 1 tablet (40 mg total) by mouth daily. 90 tablet 3  . Vitamin D, Ergocalciferol, (DRISDOL) 50000 UNITS CAPS capsule Take 50,000 Units by mouth daily.    . metoprolol succinate (TOPROL-XL) 25 MG 24 hr tablet Take 1 tablet (25 mg total) by mouth daily. 100 tablet 3   No facility-administered medications prior to visit.    ROS Review of Systems  Constitutional: Negative for chills, activity change, appetite change, fatigue and unexpected weight change.  HENT: Negative for congestion, mouth sores and sinus pressure.   Eyes: Negative for visual disturbance.  Respiratory: Negative for chest tightness.   Cardiovascular: Negative for chest pain and palpitations.  Gastrointestinal: Negative for nausea and abdominal pain.  Genitourinary: Negative for frequency, difficulty urinating and vaginal pain.  Musculoskeletal: Negative for back pain and gait problem.  Skin: Negative for pallor  and rash.  Neurological: Negative for dizziness, tremors, weakness, numbness and headaches.  Psychiatric/Behavioral: Negative for confusion and sleep disturbance. The patient is nervous/anxious.     Objective:  BP 138/80 mmHg  Pulse 62  Wt 183 lb (83.008 kg)  SpO2 98%  BP Readings from Last 3 Encounters:  04/06/15 138/80  04/01/15 128/70  03/29/15 146/70    Wt Readings from Last 3 Encounters:  04/06/15 183 lb (83.008 kg)  04/01/15 183 lb 6.4 oz (83.19 kg)  03/29/15 182 lb (82.555 kg)    Physical Exam  Constitutional: She appears well-developed. No distress.  HENT:  Head: Normocephalic.  Right Ear: External ear normal.  Left Ear: External ear normal.  Nose: Nose normal.  Mouth/Throat: Oropharynx is clear and moist.  Eyes: Conjunctivae are normal. Pupils are equal, round, and reactive to light. Right eye exhibits no discharge. Left eye exhibits no discharge.  Neck: Normal range of motion. Neck supple. No JVD present. No tracheal deviation present. No thyromegaly present.  Cardiovascular: Normal rate, regular rhythm and normal heart sounds.   Pulmonary/Chest: No stridor. No respiratory distress. She has no wheezes.  Abdominal: Soft. Bowel sounds are normal. She exhibits no distension and no mass. There is no tenderness. There is no rebound and no guarding.  Musculoskeletal: She exhibits no edema or tenderness.  Lymphadenopathy:    She has no cervical adenopathy.  Neurological: She displays normal reflexes. No cranial  nerve deficit. She exhibits normal muscle tone. Coordination normal.  Skin: No rash noted. No erythema.  Psychiatric: She has a normal mood and affect. Her behavior is normal. Judgment and thought content normal.    Lab Results  Component Value Date   WBC 6.7 03/29/2015   HGB 16.7* 03/29/2015   HCT 50.2* 03/29/2015   PLT 210.0 03/29/2015   GLUCOSE 107* 03/29/2015   CHOL 175 11/27/2014   TRIG 103.0 11/27/2014   HDL 50.40 11/27/2014   LDLDIRECT 139.3  02/21/2008   LDLCALC 104* 11/27/2014   ALT 18 03/29/2015   AST 17 03/29/2015   NA 141 03/29/2015   K 4.0 03/29/2015   CL 103 03/29/2015   CREATININE 0.57 03/29/2015   BUN 12 03/29/2015   CO2 31 03/29/2015   TSH 1.81 03/29/2015   HGBA1C 5.4 11/27/2014   MICROALBUR 0.4 06/25/2008    Mm Digital Screening Bilateral  08/07/2014  CLINICAL DATA:  Screening. EXAM: DIGITAL SCREENING BILATERAL MAMMOGRAM WITH CAD COMPARISON:  Previous exam(s). ACR Breast Density Category b: There are scattered areas of fibroglandular density. FINDINGS: There are no findings suspicious for malignancy. Images were processed with CAD. IMPRESSION: No mammographic evidence of malignancy. A result letter of this screening mammogram will be mailed directly to the patient. RECOMMENDATION: Screening mammogram in one year. (Code:SM-B-01Y) BI-RADS CATEGORY  1: Negative. Electronically Signed   By: Lovey Newcomer M.D.   On: 08/07/2014 10:51    Assessment & Plan:   Diagnoses and all orders for this visit:  Tachycardia  Paroxysmal atrial fibrillation ([place])  DOE (dyspnea on exertion)  Essential hypertension  Other orders -     metoprolol succinate (TOPROL-XL) 25 MG 24 hr tablet; Take 0.5 tablets (12.5 mg total) by mouth daily.   I have changed Kathleen Mejia's metoprolol succinate. I am also having her maintain her fish oil-omega-3 fatty acids, Ascorbic Acid (VITAMIN C PO), b complex vitamins, Coenzyme Q10 (CO Q 10 PO), multivitamin with minerals, telmisartan, bumetanide, apixaban, (FLAXSEED, LINSEED, PO), and Vitamin D (Ergocalciferol).  Meds ordered this encounter  Medications  . metoprolol succinate (TOPROL-XL) 25 MG 24 hr tablet    Sig: Take 0.5 tablets (12.5 mg total) by mouth daily.    Dispense:  100 tablet    Refill:  3     Follow-up: Return in about 3 months (around 07/07/2015) for a follow-up visit.  Walker Kehr, MD

## 2015-04-06 NOTE — Patient Instructions (Signed)
Event monitors are medical devices that record the heart's electrical activity. Doctors most often [place] these monitors to diagnose arrhythmias. Arrhythmias are problems with the speed or rhythm of the heartbeat. The monitor is a small, portable device. You can wear one while you do your normal daily activities. This is usually used to diagnose what is causing palpitations/syncope (passing out). Cardiac Event Monitoring A cardiac event monitor is a small recording device used to help detect abnormal heart rhythms (arrhythmias). The monitor is used to record heart rhythm when noticeable symptoms such as the following occur:  Fast heartbeats (palpitations), such as heart racing or fluttering.  Dizziness.  Fainting or light-headedness.  Unexplained weakness. The monitor is wired to two electrodes placed on your chest. Electrodes are flat, sticky disks that attach to your skin. The monitor can be worn for up to 30 days. You will wear the monitor at all times, except when bathing.  HOW TO USE YOUR CARDIAC EVENT MONITOR A technician will prepare your chest for the electrode placement. The technician will show you how to place the electrodes, how to work the monitor, and how to replace the batteries. Take time to practice using the monitor before you leave the office. Make sure you understand how to send the information from the monitor to your health care provider. This requires a telephone with a landline, not a cell phone. You need to:  Wear your monitor at all times, except when you are in water:  Do not get the monitor wet.  Take the monitor off when bathing. Do not swim or use a hot tub with it on.  Keep your skin clean. Do not put body lotion or moisturizer on your chest.  Change the electrodes daily or any time they stop sticking to your skin. You might need to use tape to keep them on.  It is possible that your skin under the electrodes could become irritated. To keep this from happening, try  to put the electrodes in slightly different places on your chest. However, they must remain in the area under your left breast and in the upper right section of your chest.  Make sure the monitor is safely clipped to your clothing or in a location close to your body that your health care provider recommends.  Press the button to record when you feel symptoms of heart trouble, such as dizziness, weakness, light-headedness, palpitations, thumping, shortness of breath, unexplained weakness, or a fluttering or racing heart. The monitor is always on and records what happened slightly before you pressed the button, so do not worry about being too late to get good information.  Keep a diary of your activities, such as walking, doing chores, and taking medicine. It is especially important to note what you were doing when you pushed the button to record your symptoms. This will help your health care provider determine what might be contributing to your symptoms. The information stored in your monitor will be reviewed by your health care provider alongside your diary entries.  Send the recorded information as recommended by your health care provider. It is important to understand that it will take some time for your health care provider to process the results.  Change the batteries as recommended by your health care provider. SEEK IMMEDIATE MEDICAL CARE IF:   You have chest pain.  You have extreme difficulty breathing or shortness of breath.  You develop a very fast heartbeat that persists.  You develop dizziness that does not go away.  You faint or constantly feel you are about to faint.   This information is not intended to replace advice given to you by your health care provider. Make sure you discuss any questions you have with your health care provider.   Document Released: 02/29/2008 Document Revised: 06/12/2014 Document Reviewed: 11/18/2012 Elsevier Interactive Patient Education [employer].

## 2015-04-06 NOTE — Assessment & Plan Note (Signed)
On Toprol, Eliquis Event monitor F/u with Dr Debara Pickett

## 2015-04-06 NOTE — Progress Notes (Signed)
Pre visit review using our clinic review tool, if applicable. No additional management support is needed unless otherwise documented below in the visit note. 

## 2015-04-09 ENCOUNTER — Encounter: Payer: Self-pay | Admitting: Physician Assistant

## 2015-04-09 ENCOUNTER — Ambulatory Visit (INDEPENDENT_AMBULATORY_CARE_PROVIDER_SITE_OTHER): Payer: Medicare Other

## 2015-04-09 DIAGNOSIS — I484 Atypical atrial flutter: Secondary | ICD-10-CM

## 2015-04-13 ENCOUNTER — Encounter: Payer: Self-pay | Admitting: Internal Medicine

## 2015-04-21 ENCOUNTER — Telehealth: Payer: Self-pay | Admitting: Internal Medicine

## 2015-04-21 NOTE — Telephone Encounter (Signed)
Drink more water Take Toprol at hs - ok to reduce Toprol  in 1/2 if needed Thx

## 2015-04-21 NOTE — Telephone Encounter (Signed)
Pt is taking metoprolol succinate (TOPROL-XL) 25 MG 24 hr tablet [id number] and apixaban (ELIQUIS) 5 MG TABS tablet [id number] And she is feeling weak and dehydrated. She is not sure which medication is making her feel like this. Please give her a call at [phone]

## 2015-04-26 NOTE — Telephone Encounter (Signed)
Stop Eliquis. Go to ER if lightheaded, weak. Take OTC Nexium 2 a day OV when Mrs Bohanon is back here. Note - meds like Petobismol may make stool look black. Do not take Advil, Aleve Thx

## 2015-04-26 NOTE — Telephone Encounter (Signed)
Pt called back and stated that her stool is black and the pharmacy advise her to stop take Eliquis for now. Pt was wondering what's Dr. Camila Li recommend. Pt is at [facility] the family. Please give her a call back  Phone # [phone]

## 2015-04-27 NOTE — Telephone Encounter (Signed)
Spoke with patient.  Gave MD response.

## 2015-05-05 ENCOUNTER — Other Ambulatory Visit (INDEPENDENT_AMBULATORY_CARE_PROVIDER_SITE_OTHER): Payer: Medicare Other

## 2015-05-05 ENCOUNTER — Encounter: Payer: Self-pay | Admitting: Internal Medicine

## 2015-05-05 ENCOUNTER — Ambulatory Visit (INDEPENDENT_AMBULATORY_CARE_PROVIDER_SITE_OTHER): Payer: Medicare Other | Admitting: Internal Medicine

## 2015-05-05 VITALS — BP 130/68 | HR 78 | Wt 181.0 lb

## 2015-05-05 DIAGNOSIS — K921 Melena: Secondary | ICD-10-CM | POA: Diagnosis not present

## 2015-05-05 DIAGNOSIS — R7989 Other specified abnormal findings of blood chemistry: Secondary | ICD-10-CM

## 2015-05-05 DIAGNOSIS — I480 Paroxysmal atrial fibrillation: Secondary | ICD-10-CM

## 2015-05-05 DIAGNOSIS — I484 Atypical atrial flutter: Secondary | ICD-10-CM

## 2015-05-05 LAB — BASIC METABOLIC PANEL
BUN: 12 mg/dL (ref 6–23)
CALCIUM: 9.8 mg/dL (ref 8.4–10.5)
CO2: 33 mEq/L — ABNORMAL HIGH (ref 19–32)
CREATININE: 0.56 mg/dL (ref 0.40–1.20)
Chloride: 101 mEq/L (ref 96–112)
GFR: 110.74 mL/min (ref >60.00)
Glucose, Bld: 97 mg/dL (ref 70–99)
Potassium: 4.4 mEq/L (ref 3.5–5.1)
Sodium: 140 mEq/L (ref 135–145)

## 2015-05-05 LAB — CBC WITH DIFFERENTIAL/PLATELET
BASOS ABS: 0 10*3/uL (ref 0.0–0.1)
Basophils Relative: 0.4 % (ref 0.0–3.0)
EOS ABS: 0.2 10*3/uL (ref 0.0–0.7)
Eosinophils Relative: 2.3 % (ref 0.0–5.0)
HEMATOCRIT: 44.8 % (ref 36.0–46.0)
Hemoglobin: 15.1 g/dL — ABNORMAL HIGH (ref 12.0–15.0)
LYMPHS ABS: 2.4 10*3/uL (ref 0.7–4.0)
LYMPHS PCT: 32.4 % (ref 12.0–46.0)
MCHC: 33.7 g/dL (ref 30.0–36.0)
MCV: 94.2 fl (ref 78.0–100.0)
MONOS PCT: 8.4 % (ref 3.0–12.0)
Monocytes Absolute: 0.6 10*3/uL (ref 0.1–1.0)
NEUTROS ABS: 4.1 10*3/uL (ref 1.4–7.7)
Neutrophils Relative %: 56.5 % (ref 43.0–77.0)
PLATELETS: 207 10*3/uL (ref 150.0–400.0)
RBC: 4.76 Mil/uL (ref 3.87–5.11)
RDW: 12.8 % (ref 11.5–15.5)
WBC: 7.3 10*3/uL (ref 4.0–10.5)

## 2015-05-05 MED ORDER — ESOMEPRAZOLE MAGNESIUM 20 MG PO CPDR: 20.0000 mg | DELAYED_RELEASE_CAPSULE | Freq: Every day | ORAL | Status: DC

## 2015-05-05 NOTE — Progress Notes (Signed)
Subjective:  Patient ID: Kathleen Mejia, female    DOB: [birth year]  Age: 81 y.o. MRN: [id number]  CC: No chief complaint on file.   HPI Takila Divito Zaccaro presents for weakness, muscle pains, and "spacy feeling" Pt hat had a black stool (11/16) and stopped Eliquis. No more black stools and feeling better off Eliquis. On Nexium.  Outpatient Prescriptions Prior to Visit  Medication Sig Dispense Refill  . Ascorbic Acid (VITAMIN C PO) Take 1 tablet by mouth daily.    [name] b complex vitamins tablet Take 1 tablet by mouth daily.    . bumetanide (BUMEX) 0.5 MG tablet Take 1 tablet (0.5 mg total) by mouth daily. 90 tablet 3  . Coenzyme Q10 (CO Q 10 PO) Take 1 capsule by mouth daily.    . fish oil-omega-3 fatty acids 1000 MG capsule Take 2 g by mouth 2 (two) times daily.      [name] FLAXSEED, LINSEED, PO Take by mouth daily. PATIENT SPRINKLE FLAX SEEDS ONTO HER CEREAL DAILY    . metoprolol succinate (TOPROL-XL) 25 MG 24 hr tablet Take 0.5 tablets (12.5 mg total) by mouth daily. 100 tablet 3  . Multiple Vitamin (MULITIVITAMIN WITH MINERALS) TABS Take 1 tablet by mouth daily.    [name] telmisartan (MICARDIS) 40 MG tablet Take 1 tablet (40 mg total) by mouth daily. 90 tablet 3  . Vitamin D, Ergocalciferol, (DRISDOL) 50000 UNITS CAPS capsule Take 50,000 Units by mouth daily.    [name] apixaban (ELIQUIS) 5 MG TABS tablet Take 1 tablet (5 mg total) by mouth 2 (two) times daily. (Patient not taking: Reported on 05/05/2015) 60 tablet 11   No facility-administered medications prior to visit.    ROS Review of Systems  Constitutional: Negative for chills, activity change, appetite change, fatigue and unexpected weight change.  HENT: Negative for congestion, mouth sores and sinus pressure.   Eyes: Negative for visual disturbance.  Respiratory: Negative for cough and chest tightness.   Gastrointestinal: Negative for nausea and abdominal pain.  Genitourinary: Negative for frequency, difficulty urinating and vaginal pain.    Musculoskeletal: Negative for back pain and gait problem.  Skin: Negative for pallor and rash.  Neurological: Negative for dizziness, tremors, weakness, numbness and headaches.  Psychiatric/Behavioral: Negative for confusion and sleep disturbance.    Objective:  BP 130/68 mmHg  Pulse 78  Wt 181 lb (82.101 kg)  SpO2 98%  BP Readings from Last 3 Encounters:  05/05/15 130/68  04/06/15 138/80  04/01/15 128/70    Wt Readings from Last 3 Encounters:  05/05/15 181 lb (82.101 kg)  04/06/15 183 lb (83.008 kg)  04/01/15 183 lb 6.4 oz (83.19 kg)    Physical Exam  Constitutional: She appears well-developed. No distress.  HENT:  Head: Normocephalic.  Right Ear: External ear normal.  Left Ear: External ear normal.  Nose: Nose normal.  Mouth/Throat: Oropharynx is clear and moist.  Eyes: Conjunctivae are normal. Pupils are equal, round, and reactive to light. Right eye exhibits no discharge. Left eye exhibits no discharge.  Neck: Normal range of motion. Neck supple. No JVD present. No tracheal deviation present. No thyromegaly present.  Cardiovascular: Normal rate, regular rhythm and normal heart sounds.   Pulmonary/Chest: No stridor. No respiratory distress. She has no wheezes.  Abdominal: Soft. Bowel sounds are normal. She exhibits no distension and no mass. There is no tenderness. There is no rebound and no guarding.  Musculoskeletal: She exhibits no edema or tenderness.  Lymphadenopathy:    She has no  cervical adenopathy.  Neurological: She displays normal reflexes. No cranial nerve deficit. She exhibits normal muscle tone. Coordination normal.  Skin: No rash noted. No erythema.  Psychiatric: She has a normal mood and affect. Her behavior is normal. Judgment and thought content normal.    Lab Results  Component Value Date   WBC 6.7 03/29/2015   HGB 16.7* 03/29/2015   HCT 50.2* 03/29/2015   PLT 210.0 03/29/2015   GLUCOSE 107* 03/29/2015   CHOL 175 11/27/2014   TRIG 103.0  11/27/2014   HDL 50.40 11/27/2014   LDLDIRECT 139.3 02/21/2008   LDLCALC 104* 11/27/2014   ALT 18 03/29/2015   AST 17 03/29/2015   NA 141 03/29/2015   K 4.0 03/29/2015   CL 103 03/29/2015   CREATININE 0.57 03/29/2015   BUN 12 03/29/2015   CO2 31 03/29/2015   TSH 1.81 03/29/2015   HGBA1C 5.4 11/27/2014   MICROALBUR 0.4 06/25/2008    Mm Digital Screening Bilateral  08/07/2014  CLINICAL DATA:  Screening. EXAM: DIGITAL SCREENING BILATERAL MAMMOGRAM WITH CAD COMPARISON:  Previous exam(s). ACR Breast Density Category b: There are scattered areas of fibroglandular density. FINDINGS: There are no findings suspicious for malignancy. Images were processed with CAD. IMPRESSION: No mammographic evidence of malignancy. A result letter of this screening mammogram will be mailed directly to the patient. RECOMMENDATION: Screening mammogram in one year. (Code:SM-B-01Y) BI-RADS CATEGORY  1: Negative. Electronically Signed   By: Lovey Newcomer M.D.   On: 08/07/2014 10:51    Assessment & Plan:   There are no diagnoses linked to this encounter. I am having Ms. Albers maintain her fish oil-omega-3 fatty acids, Ascorbic Acid (VITAMIN C PO), b complex vitamins, Coenzyme Q10 (CO Q 10 PO), multivitamin with minerals, telmisartan, bumetanide, apixaban, (FLAXSEED, LINSEED, PO), Vitamin D (Ergocalciferol), and metoprolol succinate.  No orders of the defined types were placed in this encounter.     Follow-up: No Follow-up on file.  Walker Kehr, MD

## 2015-05-05 NOTE — Assessment & Plan Note (Signed)
11/16 x1 episode on Eliquis - resolved off Rx Pt declined GI ref Cont Nexium

## 2015-05-05 NOTE — Addendum Note (Signed)
Addended by: Cresenciano Lick on: 05/05/2015 05:07 PM   Modules accepted: Orders

## 2015-05-05 NOTE — Progress Notes (Signed)
Pre visit review using our clinic review tool, if applicable. No additional management support is needed unless otherwise documented below in the visit note. 

## 2015-05-05 NOTE — Assessment & Plan Note (Addendum)
Pt hat had a black stool (11/16) and stopped Eliquis. No more black stools and feeling better off Eliquis. On Nexium. In NSR now Pt does not want to take a blood thinner. Risks understood. Start ASA in 1 mo - 325 mg/d

## 2015-05-11 ENCOUNTER — Other Ambulatory Visit: Payer: Self-pay | Admitting: Internal Medicine

## 2015-05-13 ENCOUNTER — Telehealth: Payer: Self-pay | Admitting: Internal Medicine

## 2015-05-13 NOTE — Telephone Encounter (Signed)
Kathleen Mejia is returning a phone call . Please call

## 2015-05-13 NOTE — Telephone Encounter (Signed)
Gave pt monitor results. She had noted AFib on monitor (& on EKG in October).  She understands she comes in for coumadin clinic visit on 12/12 to meet [facility]. Pt currently not on anticoagulation - had questions about this & if this was OK. She notes she had to cancel her separate f/u appt w/ Dr. Debara Pickett due to personal matters.

## 2015-05-14 NOTE — Telephone Encounter (Signed)
Recommendations communicated to patient who verbalized understanding.

## 2015-05-14 NOTE — Telephone Encounter (Signed)
Don't want to start anticoagulation until we see her in office Monday to determine best course of acton

## 2015-05-17 ENCOUNTER — Ambulatory Visit: Payer: Medicare Other | Admitting: Internal Medicine

## 2015-05-17 ENCOUNTER — Ambulatory Visit (INDEPENDENT_AMBULATORY_CARE_PROVIDER_SITE_OTHER): Payer: Medicare Other | Admitting: Pharmacist Clinician (PhC)/ Clinical Pharmacy Specialist

## 2015-05-17 DIAGNOSIS — I4891 Unspecified atrial fibrillation: Secondary | ICD-10-CM | POA: Diagnosis not present

## 2015-05-17 NOTE — Progress Notes (Signed)
Pt in office today to discuss switching from Eliquis to another anticoagulant.  She was started on Eliquis 5 mg bid at the end of October when she was found to have an episode of atrial flutter.  She then wore a monitor for one month, having just returned in around Dec 3.  The monitor did show some atrial fibrillation on 4 of the days she wore it.    Patient is very much against medications.  She stopped the Eliquis after having a black stool last week.  She reports that the next day her stool was black, gray and brown and has been normal since.  She refuses to restart Eliquis or other anticoagulant at this time.  She is willing to take aspirin, as she believes it is safer and will also help with her arthritis.   I reviewed her CHADS2-VASc score with her (4) and explained her risk of stroke (4%) without anticoagulation.  She is adamant about taking nothing except maybe aspirin.  When I explained that aspirin will increase her risk of GI bleed and possibly more black stools, her response was "it only happened once".  Also explained that the [place] for aspirin is different and will not decrease her stroke risk.  She understands, but ultimately believes that her atrial fib is caused by gas/indigestion or nerves from seeing doctors.  And she believes her medications are causing the gas, so she's trying to get off most of her medications and thus solving the problem.    She notes that she had to cancel her upcoming appointment with Dr. Debara Pickett because of a family crisis, so I suggested that she stop and reschedule that for after the first of January.  She declined, saying she'll see Dr. Alain Marion in February and see Dr. Debara Pickett some time after that.

## 2015-05-24 ENCOUNTER — Ambulatory Visit: Payer: Medicare Other | Admitting: Internal Medicine

## 2015-07-04 IMAGING — CR DG KNEE COMPLETE 4+V*R*
4 series · 4 of 4 positions shown · non-contrast
Comparison: None.

CLINICAL DATA: Right knee pain and swelling without injury.

EXAM:
RIGHT KNEE - COMPLETE 4+ VIEW

[view not recorded (1 of 4)]
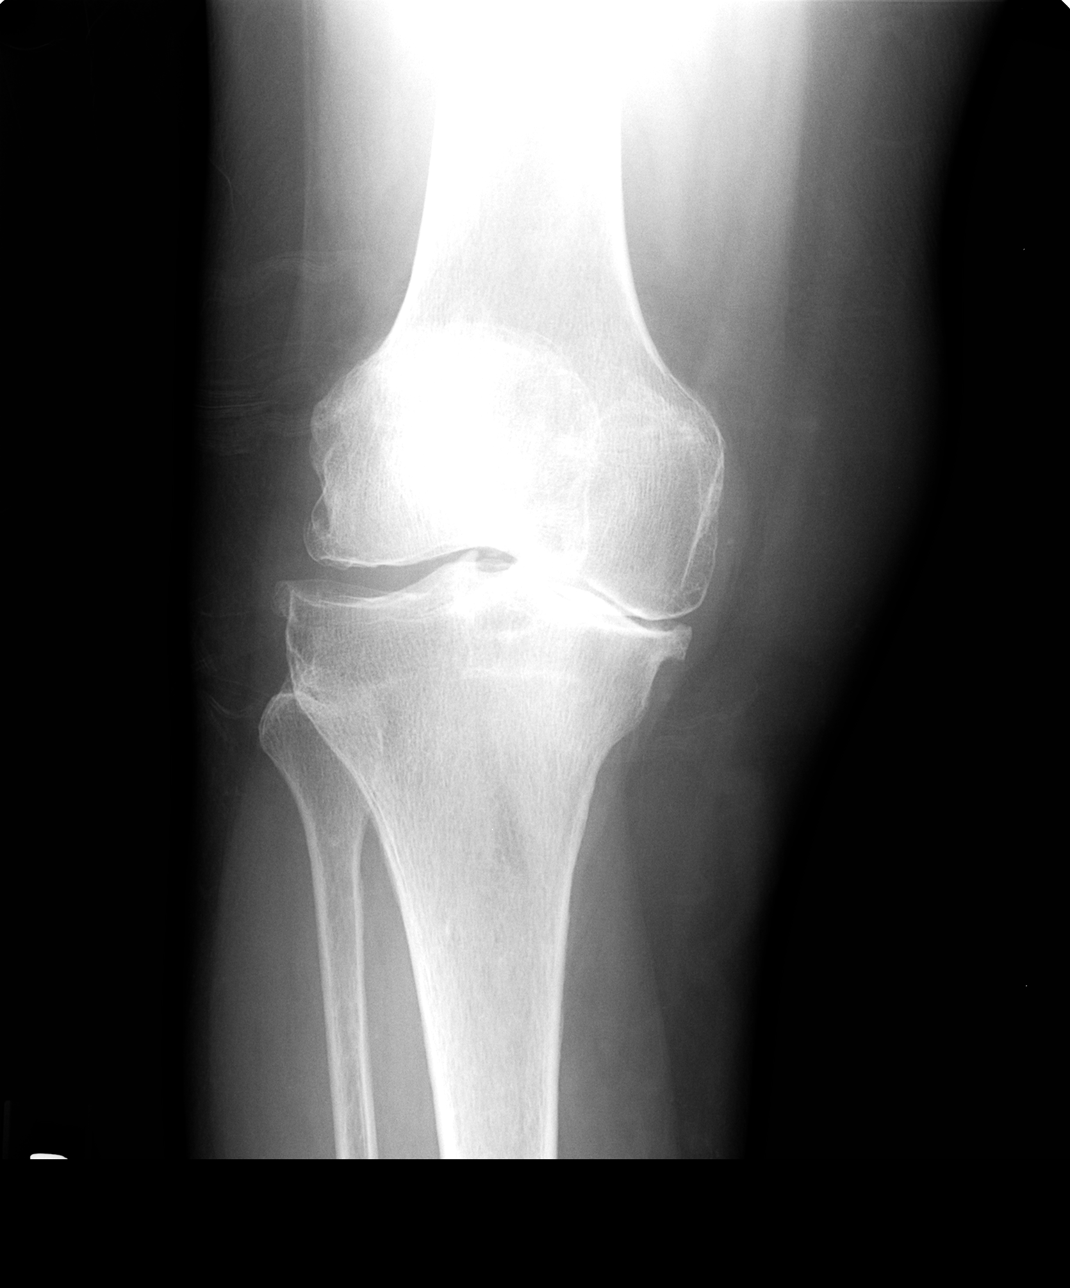

[view not recorded (2 of 4)]
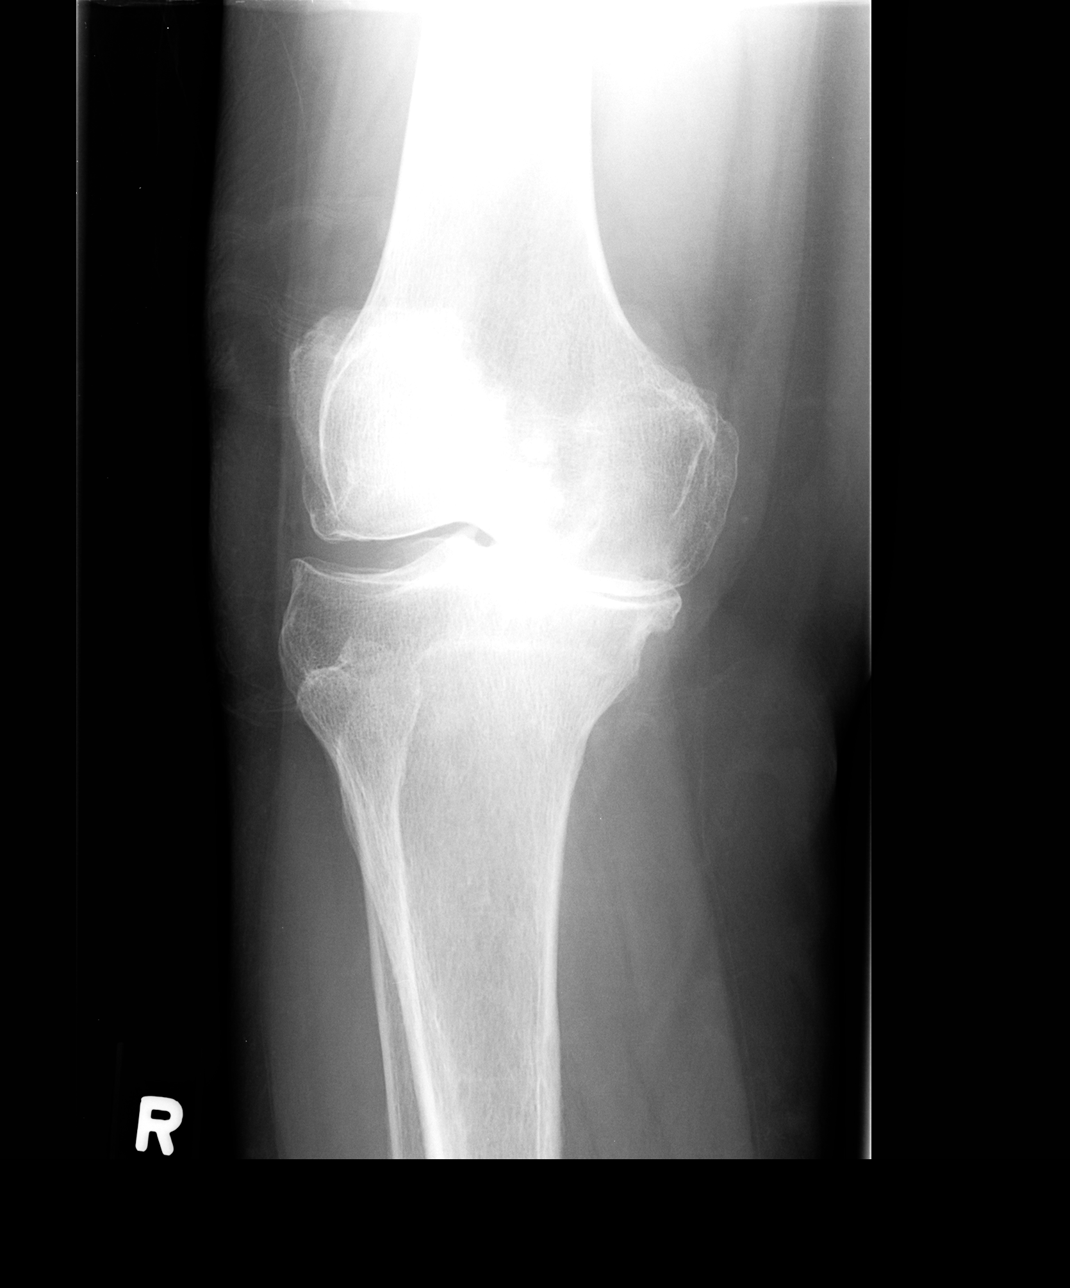

[view not recorded (3 of 4)]
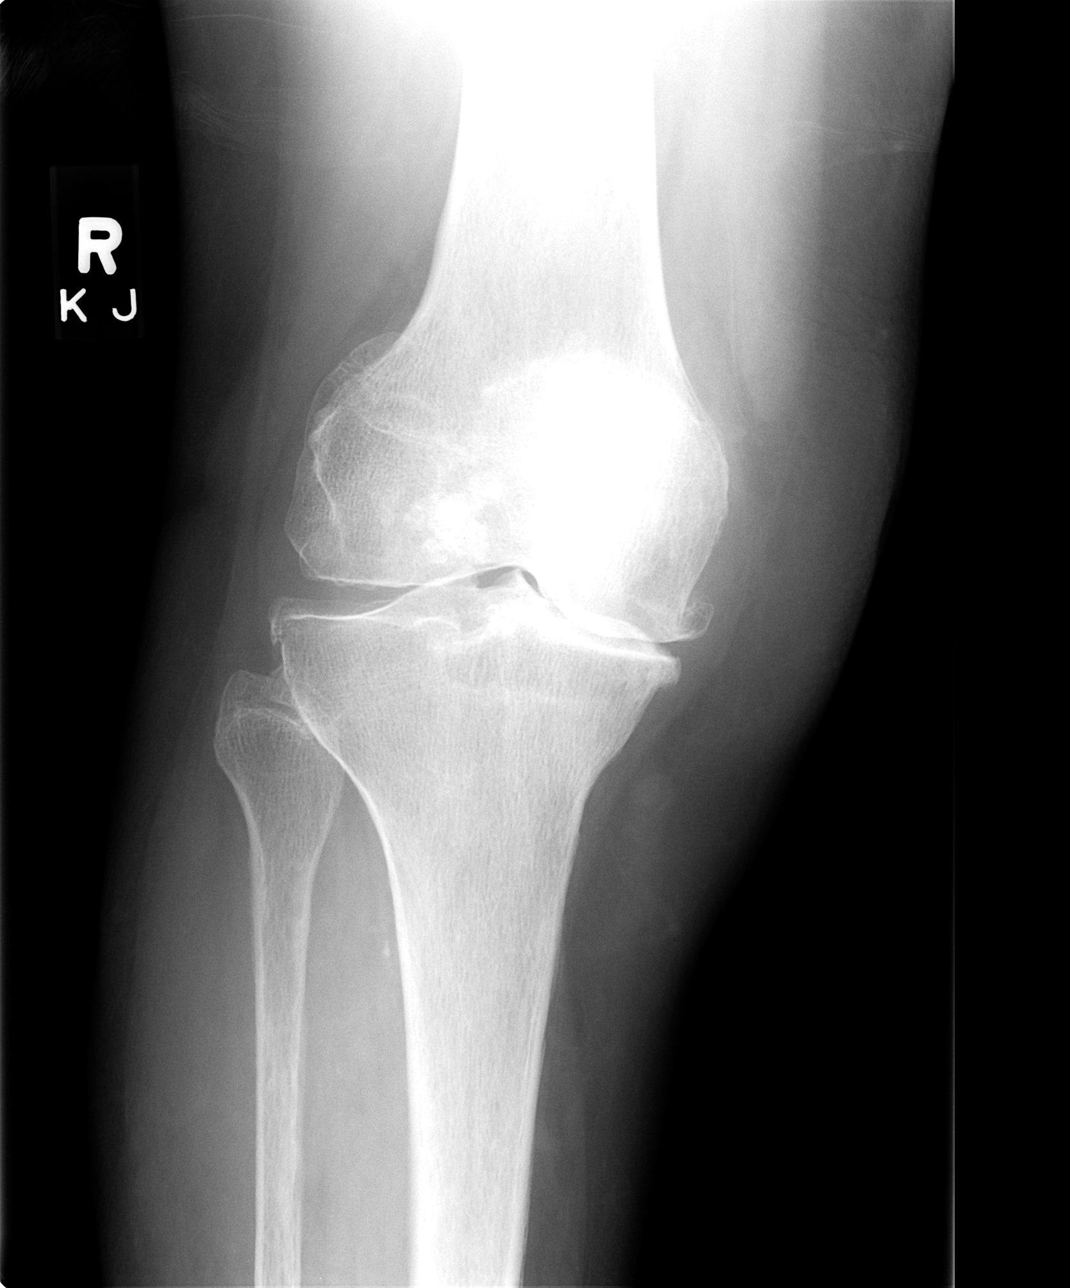

[view not recorded (4 of 4)]
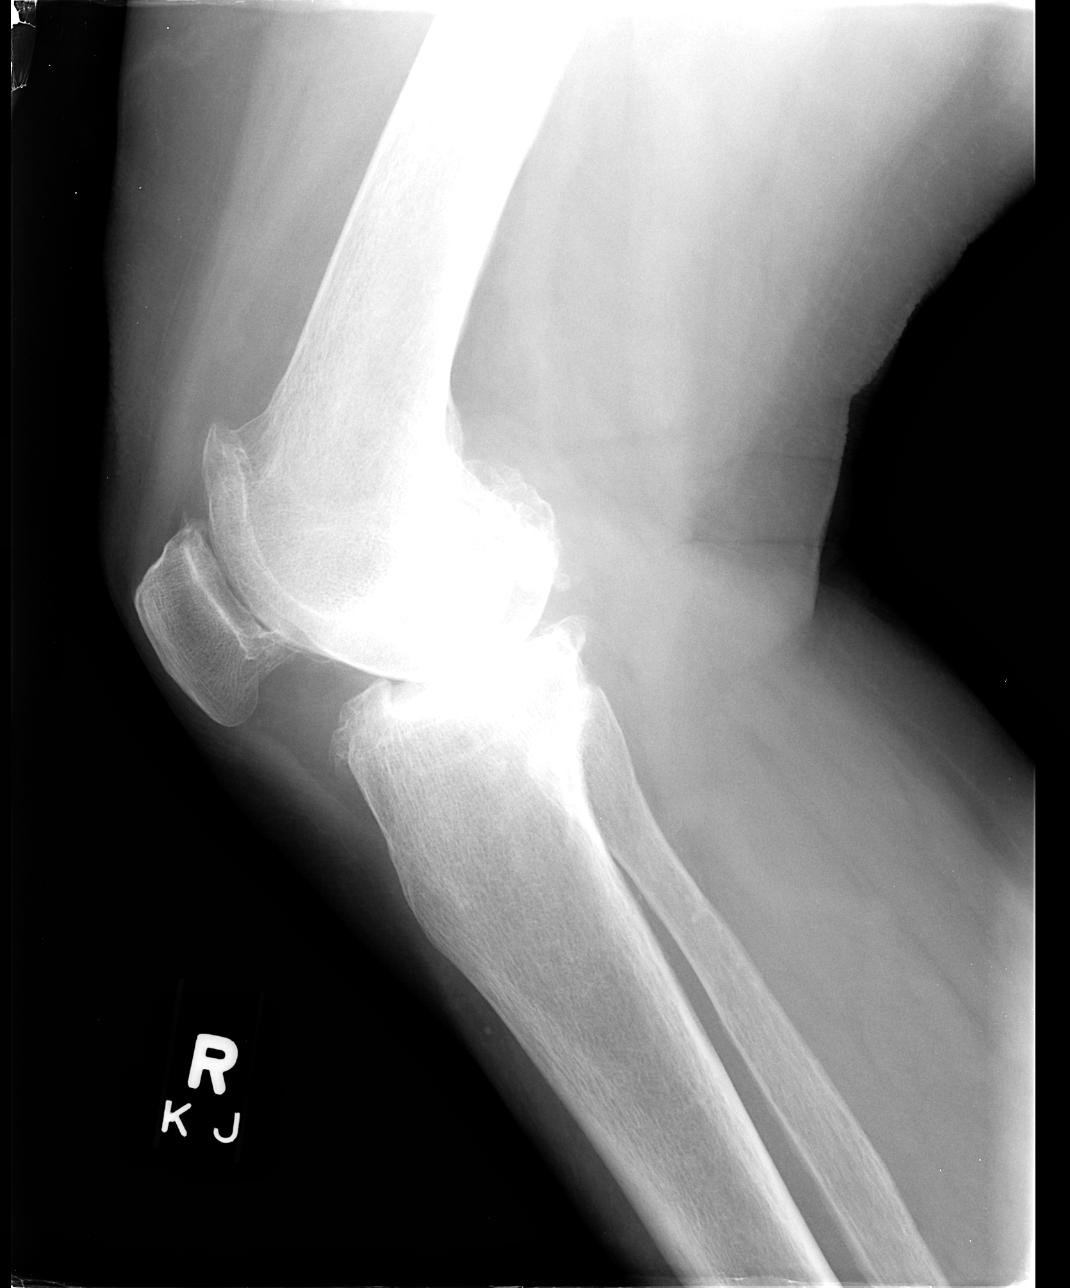

[4 of 4 positions shown; findings below may reference images not displayed]

FINDINGS: There is no evidence of fracture, dislocation, or joint effusion.
Severe narrowing of medial joint space is noted with osteophyte
formation. Moderate narrowing of patellofemoral space is also noted
with osteophyte formation. Soft tissues are unremarkable.
IMPRESSION: Severe degenerative joint disease is noted. No acute abnormality
seen in the right knee.

## 2015-07-08 ENCOUNTER — Encounter: Payer: Self-pay | Admitting: Internal Medicine

## 2015-07-08 ENCOUNTER — Other Ambulatory Visit (INDEPENDENT_AMBULATORY_CARE_PROVIDER_SITE_OTHER): Payer: Medicare Other

## 2015-07-08 ENCOUNTER — Ambulatory Visit (INDEPENDENT_AMBULATORY_CARE_PROVIDER_SITE_OTHER): Payer: Medicare Other | Admitting: Internal Medicine

## 2015-07-08 VITALS — BP 120/78 | HR 97 | Wt 181.0 lb

## 2015-07-08 DIAGNOSIS — I480 Paroxysmal atrial fibrillation: Secondary | ICD-10-CM | POA: Diagnosis not present

## 2015-07-08 DIAGNOSIS — I10 Essential (primary) hypertension: Secondary | ICD-10-CM

## 2015-07-08 DIAGNOSIS — K921 Melena: Secondary | ICD-10-CM | POA: Diagnosis not present

## 2015-07-08 DIAGNOSIS — R7989 Other specified abnormal findings of blood chemistry: Secondary | ICD-10-CM | POA: Diagnosis not present

## 2015-07-08 DIAGNOSIS — R000 Tachycardia, unspecified: Secondary | ICD-10-CM

## 2015-07-08 DIAGNOSIS — D582 Other hemoglobinopathies: Secondary | ICD-10-CM | POA: Diagnosis not present

## 2015-07-08 LAB — CBC WITH DIFFERENTIAL/PLATELET
BASOS PCT: 0.3 % (ref 0.0–3.0)
Basophils Absolute: 0 10*3/uL (ref 0.0–0.1)
EOS PCT: 4.5 % (ref 0.0–5.0)
Eosinophils Absolute: 0.4 10*3/uL (ref 0.0–0.7)
HEMATOCRIT: 44.9 % (ref 36.0–46.0)
HEMOGLOBIN: 14.9 g/dL (ref 12.0–15.0)
Lymphocytes Relative: 25.6 % (ref 12.0–46.0)
Lymphs Abs: 2 10*3/uL (ref 0.7–4.0)
MCHC: 33.1 g/dL (ref 30.0–36.0)
MCV: 94.7 fl (ref 78.0–100.0)
MONO ABS: 0.6 10*3/uL (ref 0.1–1.0)
MONOS PCT: 7.8 % (ref 3.0–12.0)
Neutro Abs: 4.9 10*3/uL (ref 1.4–7.7)
Neutrophils Relative %: 61.8 % (ref 43.0–77.0)
Platelets: 232 10*3/uL (ref 150.0–400.0)
RBC: 4.74 Mil/uL (ref 3.87–5.11)
RDW: 13.1 % (ref 11.5–15.5)
WBC: 7.9 10*3/uL (ref 4.0–10.5)

## 2015-07-08 LAB — BASIC METABOLIC PANEL
BUN: 11 mg/dL (ref 6–23)
CALCIUM: 9.4 mg/dL (ref 8.4–10.5)
CO2: 29 meq/L (ref 19–32)
CREATININE: 0.61 mg/dL (ref 0.40–1.20)
Chloride: 103 mEq/L (ref 96–112)
GFR: 100.29 mL/min (ref >60.00)
GLUCOSE: 110 mg/dL — AB (ref 70–99)
Potassium: 4.2 mEq/L (ref 3.5–5.1)
SODIUM: 140 meq/L (ref 135–145)

## 2015-07-08 NOTE — Assessment & Plan Note (Signed)
Bumex, Micardis 

## 2015-07-08 NOTE — Progress Notes (Signed)
Pre visit review using our clinic review tool, if applicable. No additional management support is needed unless otherwise documented below in the visit note. 

## 2015-07-08 NOTE — Progress Notes (Signed)
Subjective:  Patient ID: Kathleen Mejia, female    DOB: 1934-04-03  Age: 82 y.o. MRN: [id number]  CC: No chief complaint on file.   HPI Marques Camillo Mestre presents for HTN, dyslipidemia, A fib f/u. Pt stopped Eliquis and Metoprolol a while ago  Outpatient Prescriptions Prior to Visit  Medication Sig Dispense Refill  . Ascorbic Acid (VITAMIN C PO) Take 1 tablet by mouth daily.    . bumetanide (BUMEX) 0.5 MG tablet Take 1 tablet (0.5 mg total) by mouth daily. 90 tablet 3  . Cod Liver Oil 1000 MG CAPS Take 1 capsule by mouth 2 (two) times daily.    . Coenzyme Q10 (CO Q 10 PO) Take 1 capsule by mouth daily.    [name] FLAXSEED, LINSEED, PO Take by mouth daily. PATIENT SPRINKLE FLAX SEEDS ONTO HER CEREAL DAILY    . metoprolol succinate (TOPROL-XL) 25 MG 24 hr tablet Take 0.5 tablets (12.5 mg total) by mouth daily. 100 tablet 3  . Multiple Minerals (MULTI-MINERALS PO) Take 1 tablet by mouth daily.    [name] telmisartan (MICARDIS) 40 MG tablet TAKE 1 TABLET (40 MG TOTAL) BY MOUTH DAILY. 90 tablet 3  . vitamin C (ASCORBIC ACID) 500 MG tablet Take 500 mg by mouth daily.     No facility-administered medications prior to visit.    ROS Review of Systems  Constitutional: Negative for chills, activity change, appetite change, fatigue and unexpected weight change.  HENT: Negative for congestion, mouth sores and sinus pressure.   Eyes: Negative for visual disturbance.  Respiratory: Negative for cough and chest tightness.   Gastrointestinal: Negative for nausea and abdominal pain.  Genitourinary: Negative for frequency, difficulty urinating and vaginal pain.  Musculoskeletal: Negative for back pain and gait problem.  Skin: Negative for pallor and rash.  Neurological: Negative for dizziness, tremors, weakness, numbness and headaches.  Psychiatric/Behavioral: Negative for confusion and sleep disturbance. The patient is not nervous/anxious.     Objective:  BP 120/78 mmHg  Pulse 97  Wt 181 lb (82.101 kg)  SpO2  97%  BP Readings from Last 3 Encounters:  07/08/15 120/78  05/05/15 130/68  04/06/15 138/80    Wt Readings from Last 3 Encounters:  07/08/15 181 lb (82.101 kg)  05/05/15 181 lb (82.101 kg)  04/06/15 183 lb (83.008 kg)    Physical Exam  Constitutional: She appears well-developed. No distress.  HENT:  Head: Normocephalic.  Right Ear: External ear normal.  Left Ear: External ear normal.  Nose: Nose normal.  Mouth/Throat: Oropharynx is clear and moist.  Eyes: Conjunctivae are normal. Pupils are equal, round, and reactive to light. Right eye exhibits no discharge. Left eye exhibits no discharge.  Neck: Normal range of motion. Neck supple. No JVD present. No tracheal deviation present. No thyromegaly present.  Cardiovascular: Normal rate, regular rhythm and normal heart sounds.   Pulmonary/Chest: No stridor. No respiratory distress. She has no wheezes.  Abdominal: Soft. Bowel sounds are normal. She exhibits no distension and no mass. There is no tenderness. There is no rebound and no guarding.  Musculoskeletal: She exhibits no edema or tenderness.  Lymphadenopathy:    She has no cervical adenopathy.  Neurological: She displays normal reflexes. No cranial nerve deficit. She exhibits normal muscle tone. Coordination normal.  Skin: No rash noted. No erythema.  Psychiatric: She has a normal mood and affect. Her behavior is normal. Judgment and thought content normal.  Obese In NSR today HR 80s  Lab Results  Component Value Date  WBC 7.3 05/05/2015   HGB 15.1* 05/05/2015   HCT 44.8 05/05/2015   PLT 207.0 05/05/2015   GLUCOSE 97 05/05/2015   CHOL 175 11/27/2014   TRIG 103.0 11/27/2014   HDL 50.40 11/27/2014   LDLDIRECT 139.3 02/21/2008   LDLCALC 104* 11/27/2014   ALT 18 03/29/2015   AST 17 03/29/2015   NA 140 05/05/2015   K 4.4 05/05/2015   CL 101 05/05/2015   CREATININE 0.56 05/05/2015   BUN 12 05/05/2015   CO2 33* 05/05/2015   TSH 1.81 03/29/2015   HGBA1C 5.4  11/27/2014   MICROALBUR 0.4 06/25/2008    Mm Digital Screening Bilateral  08/07/2014  CLINICAL DATA:  Screening. EXAM: DIGITAL SCREENING BILATERAL MAMMOGRAM WITH CAD COMPARISON:  Previous exam(s). ACR Breast Density Category b: There are scattered areas of fibroglandular density. FINDINGS: There are no findings suspicious for malignancy. Images were processed with CAD. IMPRESSION: No mammographic evidence of malignancy. A result letter of this screening mammogram will be mailed directly to the patient. RECOMMENDATION: Screening mammogram in one year. (Code:SM-B-01Y) BI-RADS CATEGORY  1: Negative. Electronically Signed   By: Lovey Newcomer M.D.   On: 08/07/2014 10:51    Assessment & Plan:   There are no diagnoses linked to this encounter. I am having Ms. Rayborn maintain her Ascorbic Acid (VITAMIN C PO), Coenzyme Q10 (CO Q 10 PO), bumetanide, (FLAXSEED, LINSEED, PO), metoprolol succinate, telmisartan, Cod Liver Oil, Multiple Minerals (MULTI-MINERALS PO), and vitamin C.  No orders of the defined types were placed in this encounter.     Follow-up: No Follow-up on file.  Walker Kehr, MD

## 2015-07-08 NOTE — Assessment & Plan Note (Addendum)
No relapse Off Nexium "I don't need that"

## 2015-07-08 NOTE — Assessment & Plan Note (Signed)
Pt declined meds In NSR today HR 80s

## 2015-07-08 NOTE — Assessment & Plan Note (Signed)
Monitoring CBC; ASA Labs

## 2015-07-08 NOTE — Assessment & Plan Note (Addendum)
  2/17: her CHADS2-VASc score with her (4) and explained her risk of stroke (4%) without anticoagulation. She is adamant about taking nothing except maybe aspirin.  In NSR today HR 80s

## 2015-07-09 ENCOUNTER — Other Ambulatory Visit: Payer: Self-pay

## 2015-07-09 DIAGNOSIS — Z1231 Encounter for screening mammogram for malignant neoplasm of breast: Secondary | ICD-10-CM

## 2015-08-08 ENCOUNTER — Other Ambulatory Visit: Payer: Self-pay | Admitting: Internal Medicine

## 2015-08-13 ENCOUNTER — Ambulatory Visit
Admission: RE | Admit: 2015-08-13 | Discharge: 2015-08-13 | Disposition: A | Discharge status: Home / Self Care | Payer: Medicare Other | Source: Ambulatory Visit

## 2015-08-13 DIAGNOSIS — Z1231 Encounter for screening mammogram for malignant neoplasm of breast: Secondary | ICD-10-CM

## 2015-10-10 IMAGING — CR DG CHEST 2V
2 series · 2 of 2 positions shown · non-contrast
Comparison: None.

CLINICAL DATA: Gallstones.  Preop chest.

EXAM:
CHEST  2 VIEW

[w chest pa]
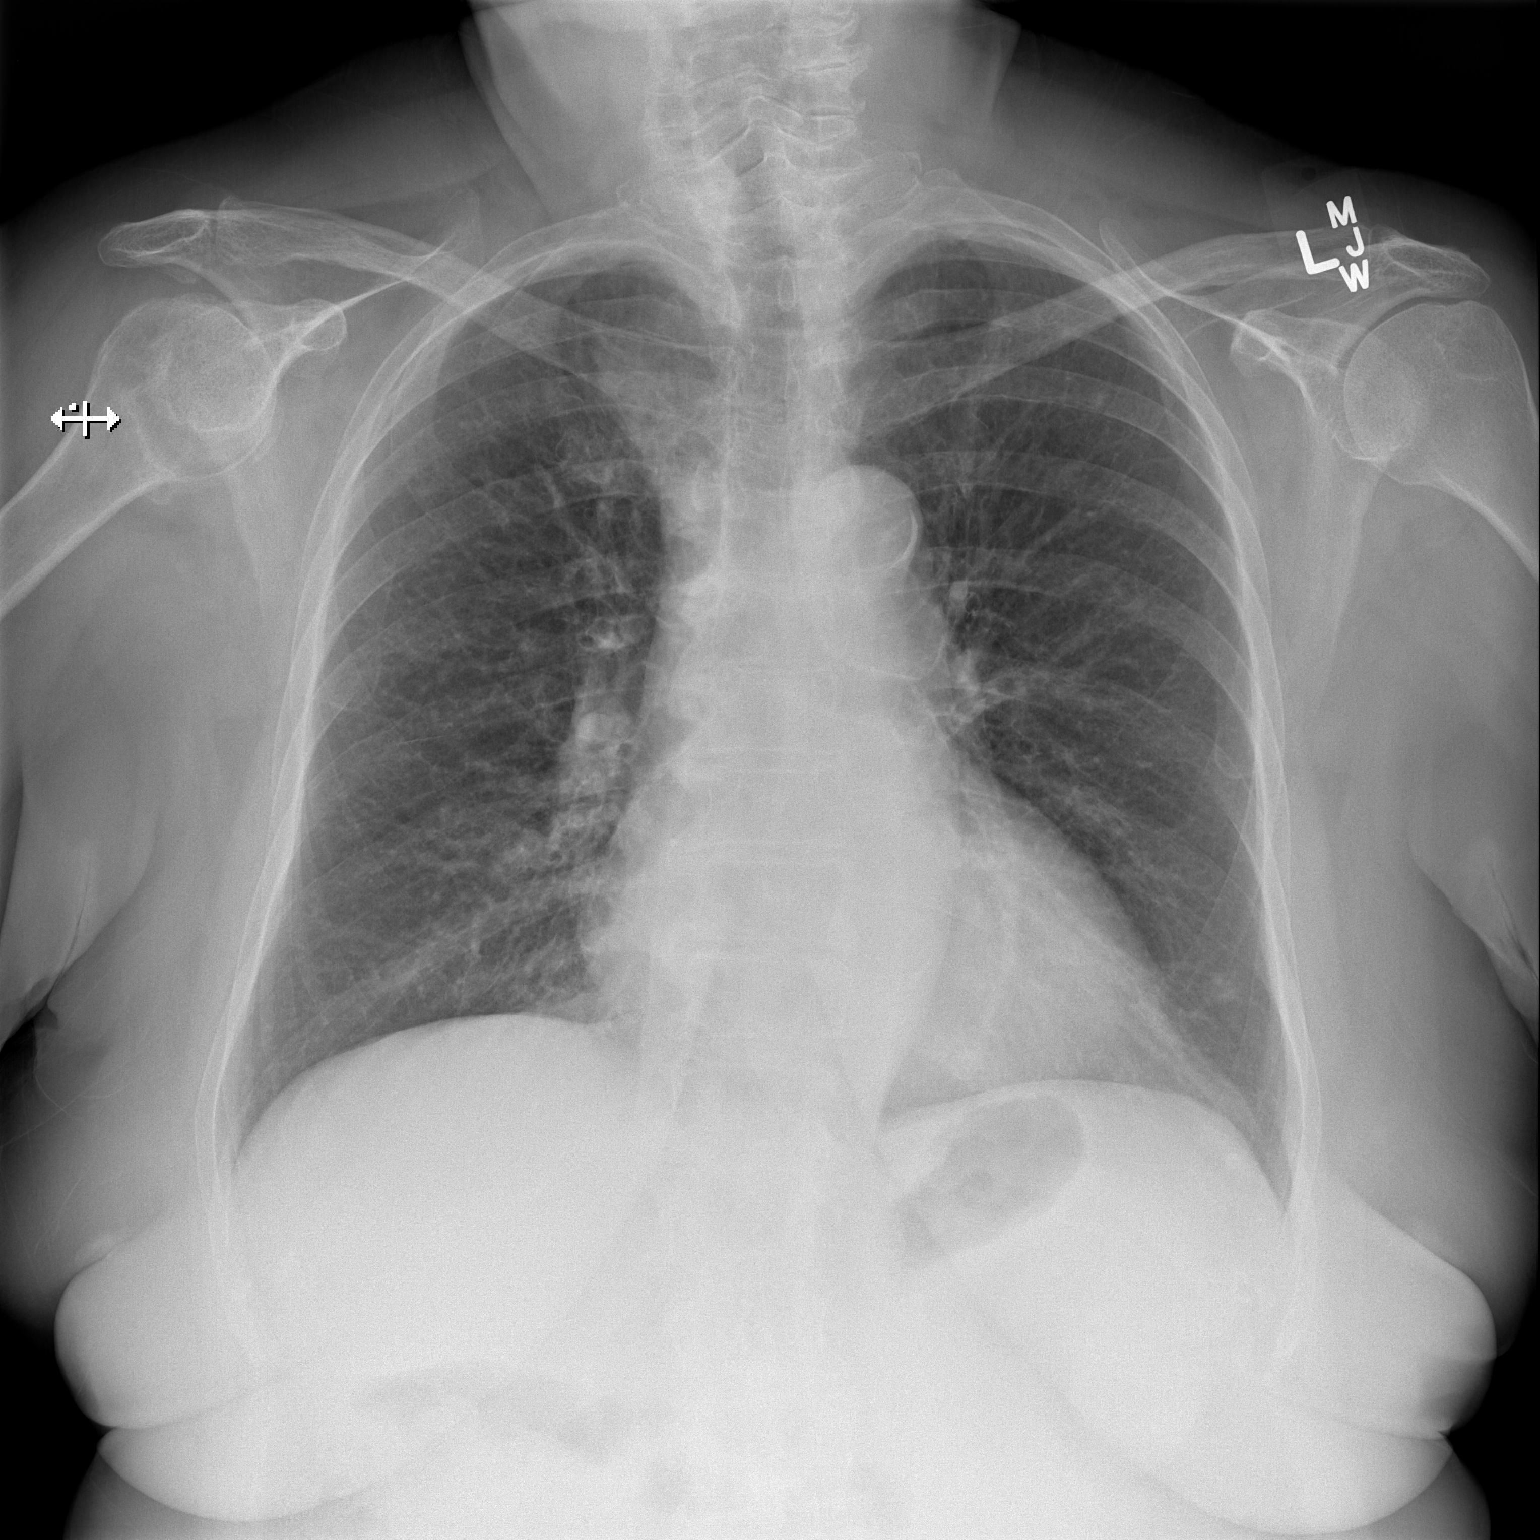

[w chest lat]
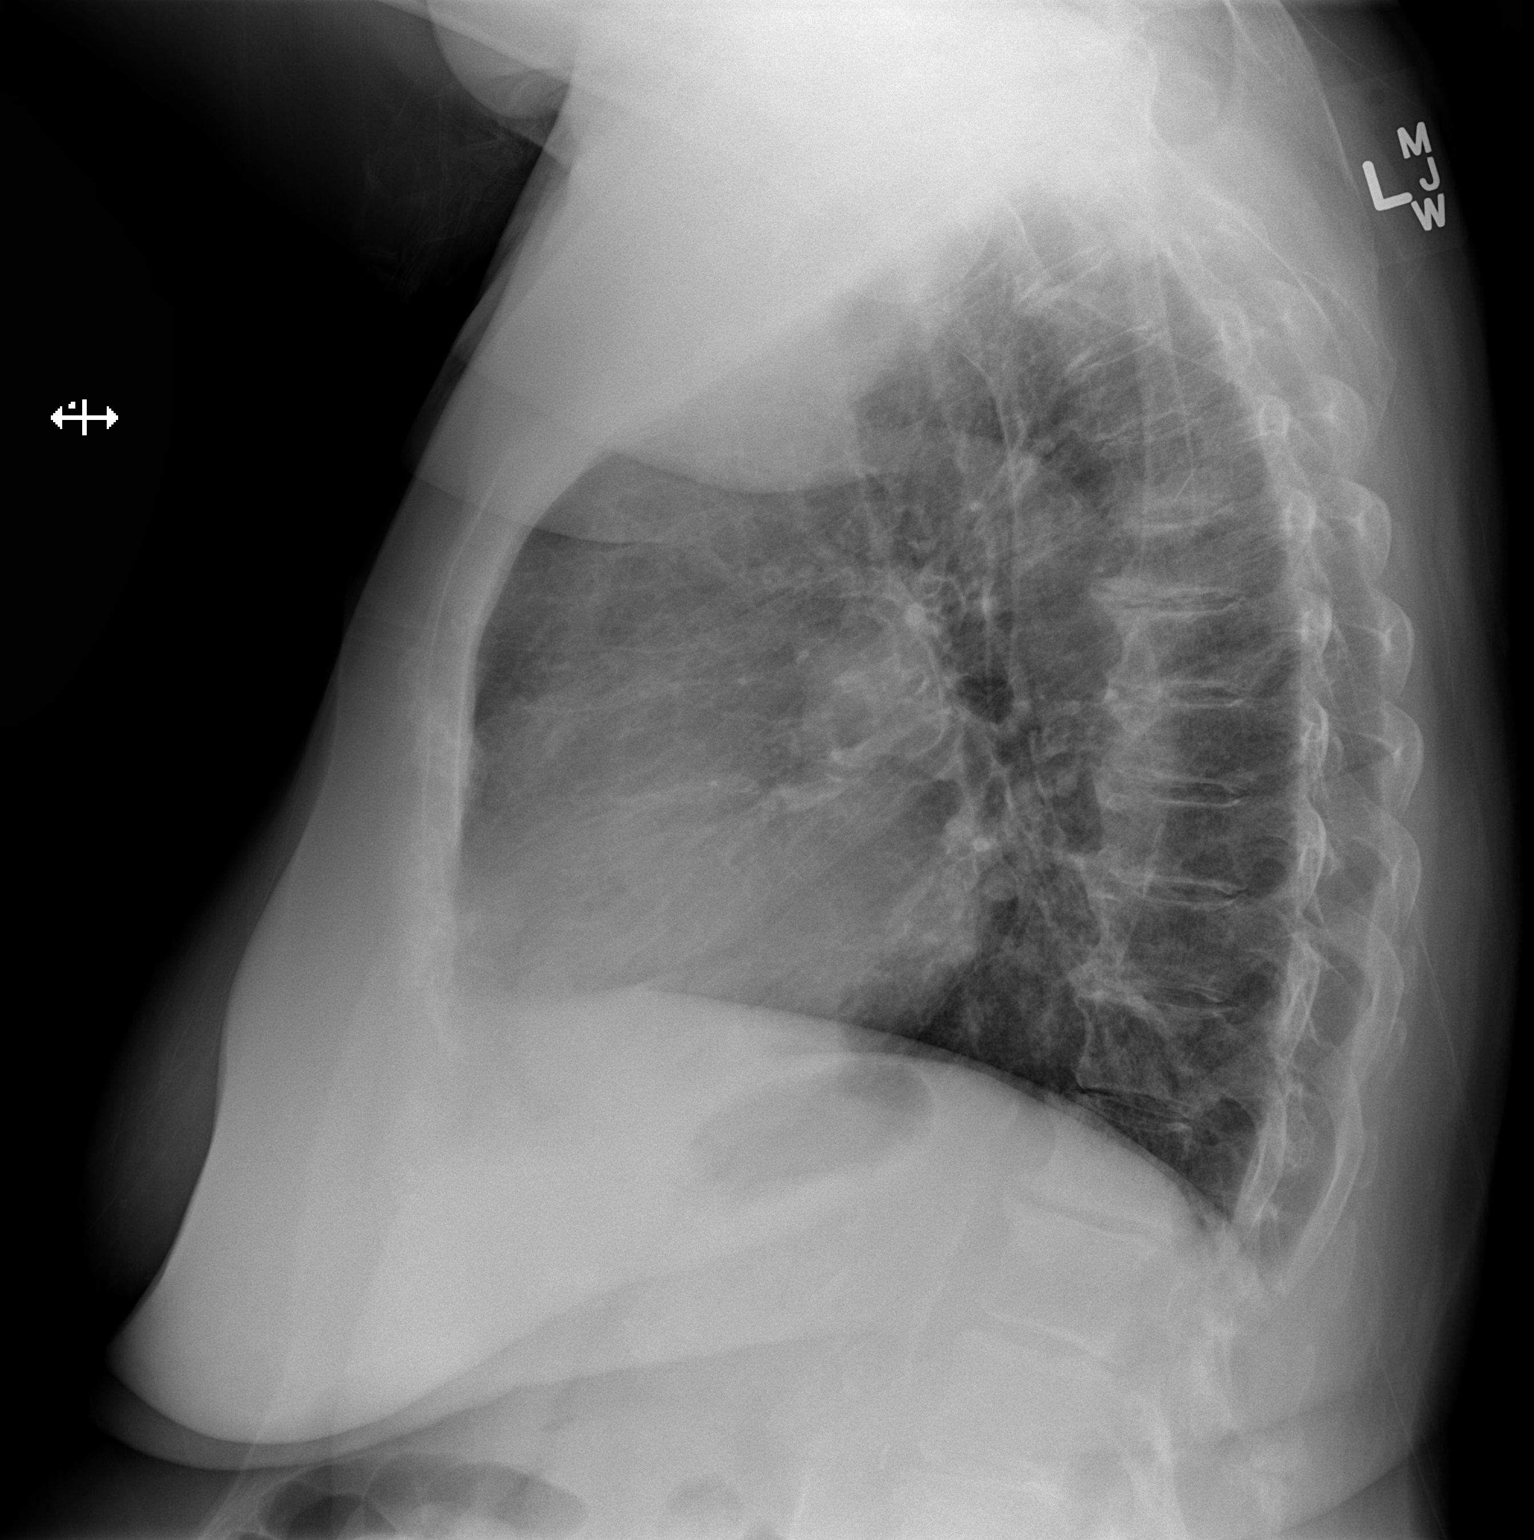

[2 of 2 positions shown; findings below may reference images not displayed]

FINDINGS: Cardiac silhouette upper limits normal. Calcified tortuous aorta.
Small nodular density projects over the LEFT lower lung zone (arrow)
probable nipple, but recommend confirmation with nipple markers, as
there is no similar density on the RIGHT. No pleural effusion. No
osseous findings. Thoracic spondylosis.
IMPRESSION: Subcentimeter nodular density at the LEFT lung base, recommend
nipple shadows for confirmation. Otherwise clear chest.

## 2015-10-11 IMAGING — RF DG CHOLANGIOGRAM OPERATIVE
1 series · 4 of 4 positions shown · non-contrast
Comparison: Ultrasound on 02/04/2014

CLINICAL DATA: Cholecystectomy for cholelithiasis.

EXAM:
INTRAOPERATIVE CHOLANGIOGRAM
TECHNIQUE: Cholangiographic images from the C-arm fluoroscopic device were
submitted for interpretation post-operatively. Please see the
procedural report for the amount of contrast and the fluoroscopy
time utilized.

[Series 1: run · 4 of 109 frames shown]
[frame 17/109]
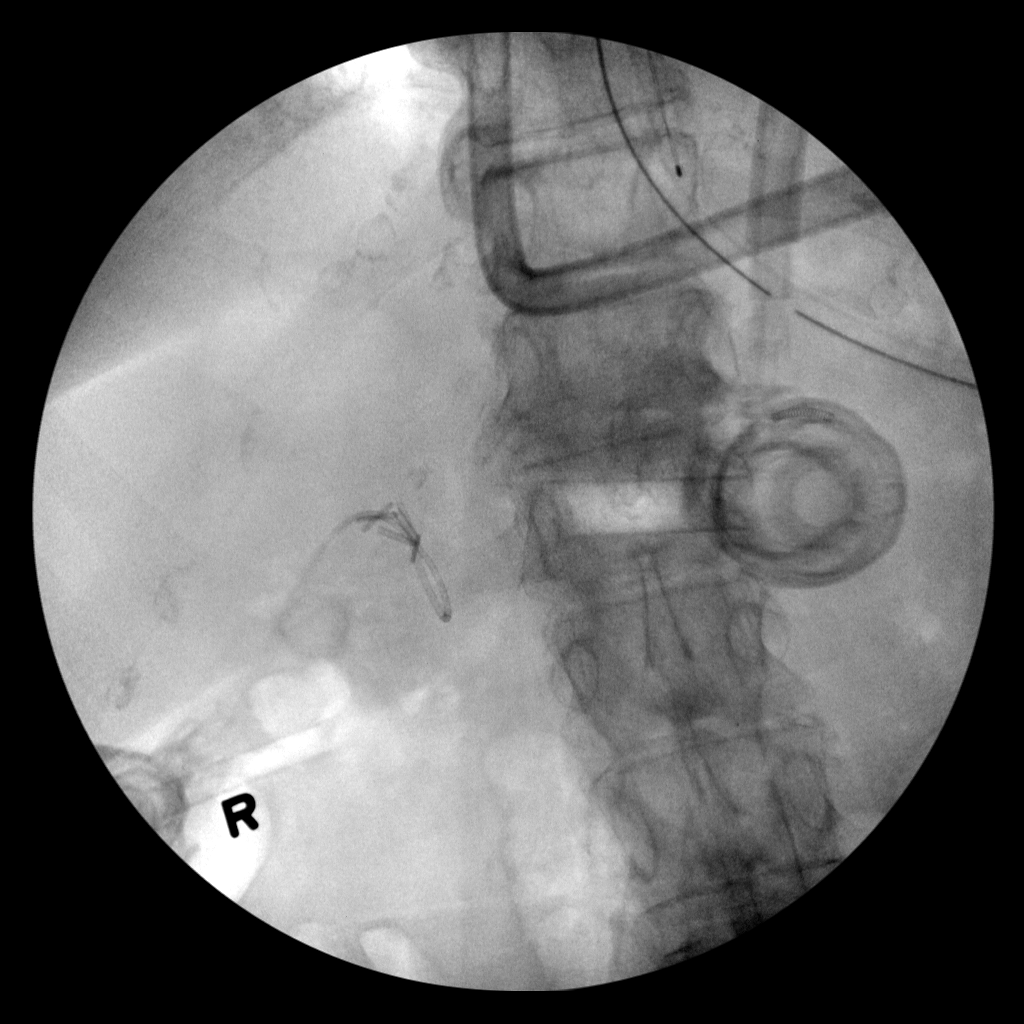
[frame 55/109]
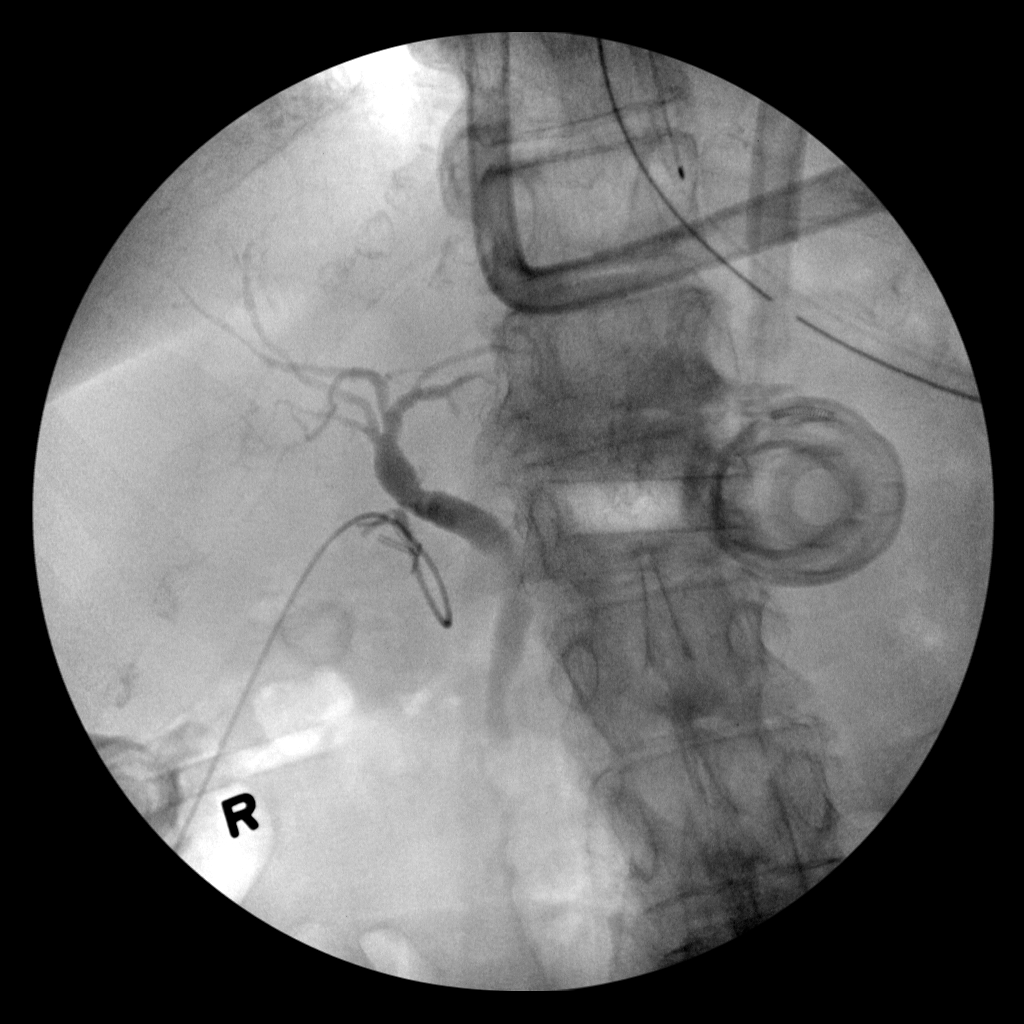
[frame 93/109]
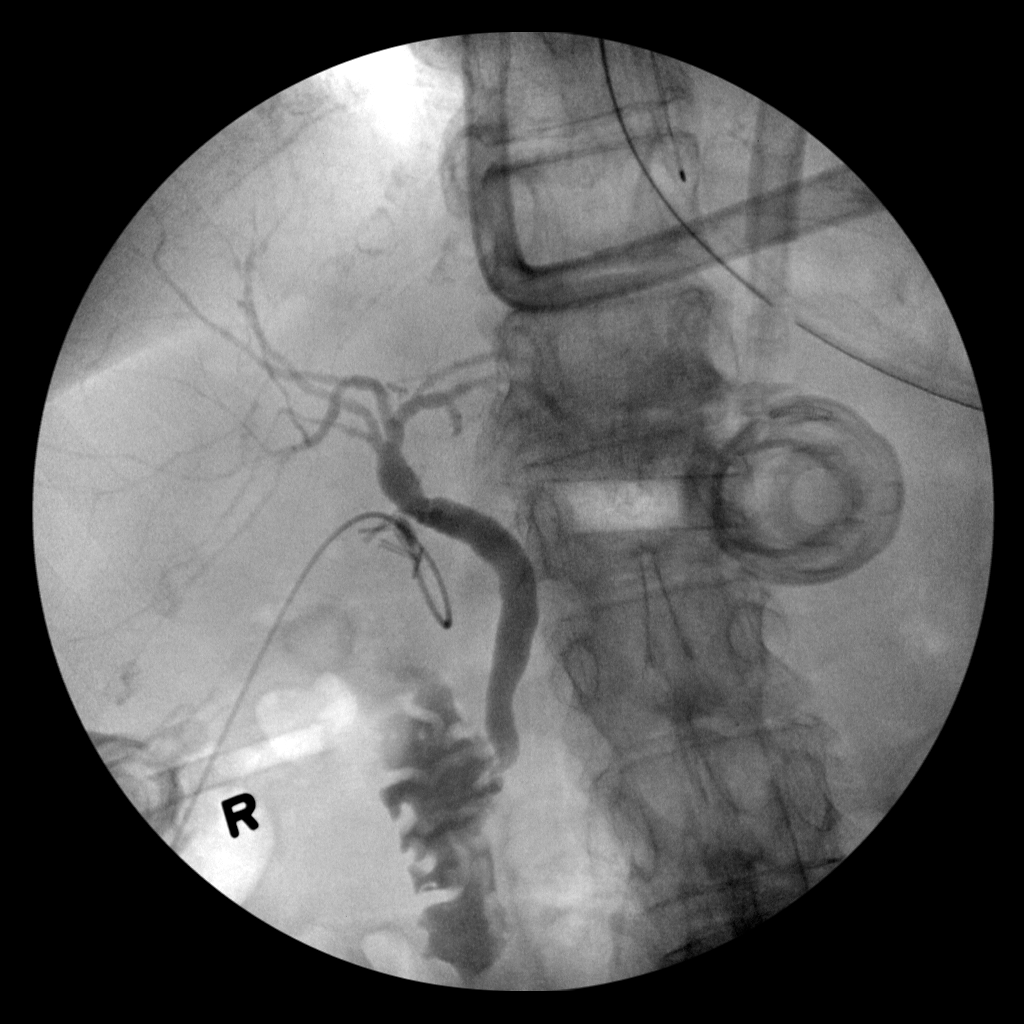
[frame 105/109]
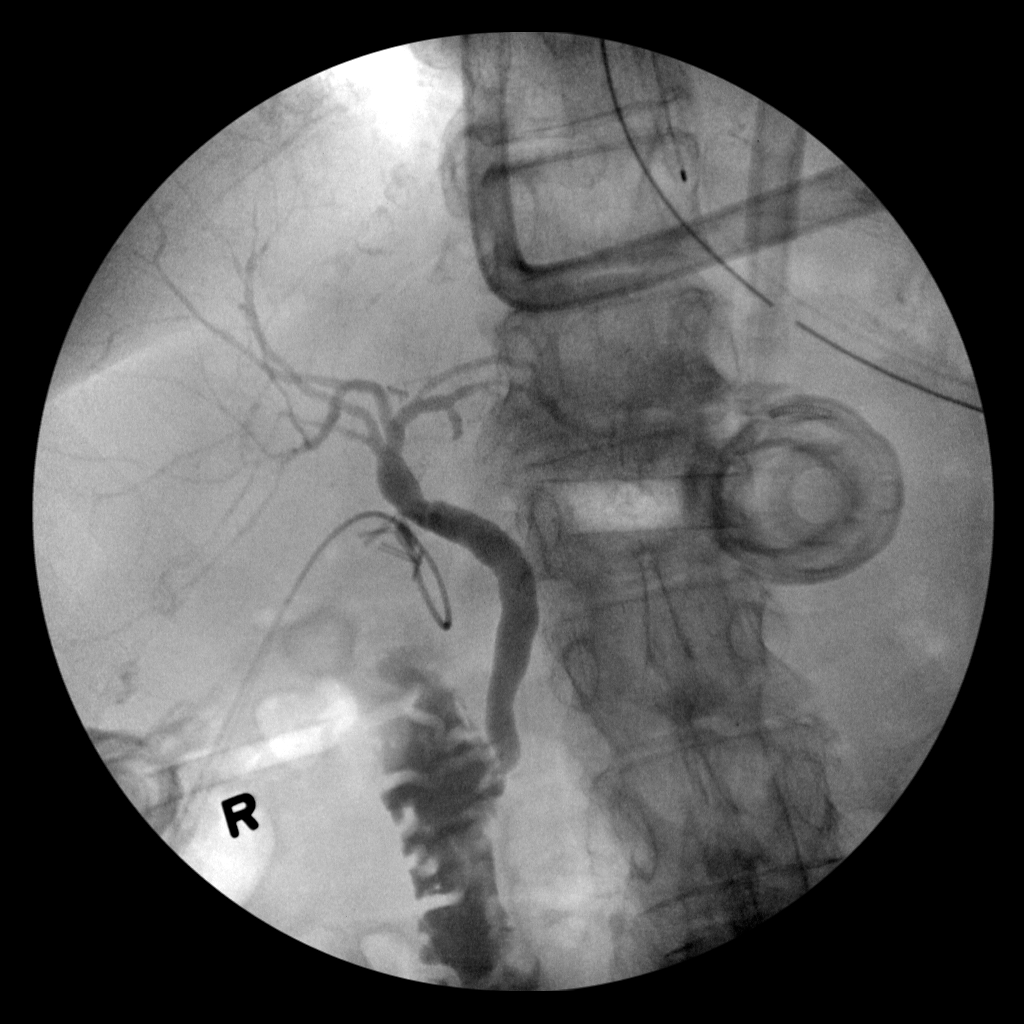

[4 of 4 positions shown; findings below may reference images not displayed]

FINDINGS: Intraoperative imaging shows normal opacified bile ducts including
the entire common bile duct. No evidence of filling defect,
extravasation or obstruction. Contrast enters the duodenum normally.
IMPRESSION: Normal intraoperative cholangiogram.

## 2015-10-21 IMAGING — CR DG CHEST SPECIAL VIEW
1 series · 1 of 1 positions shown · non-contrast
Comparison: 03/26/2014.

CLINICAL DATA: Abnormal chest x-ray.  Recent cholecystectomy.

EXAM:
CHEST SPECIAL VIEW

[w chest pa]
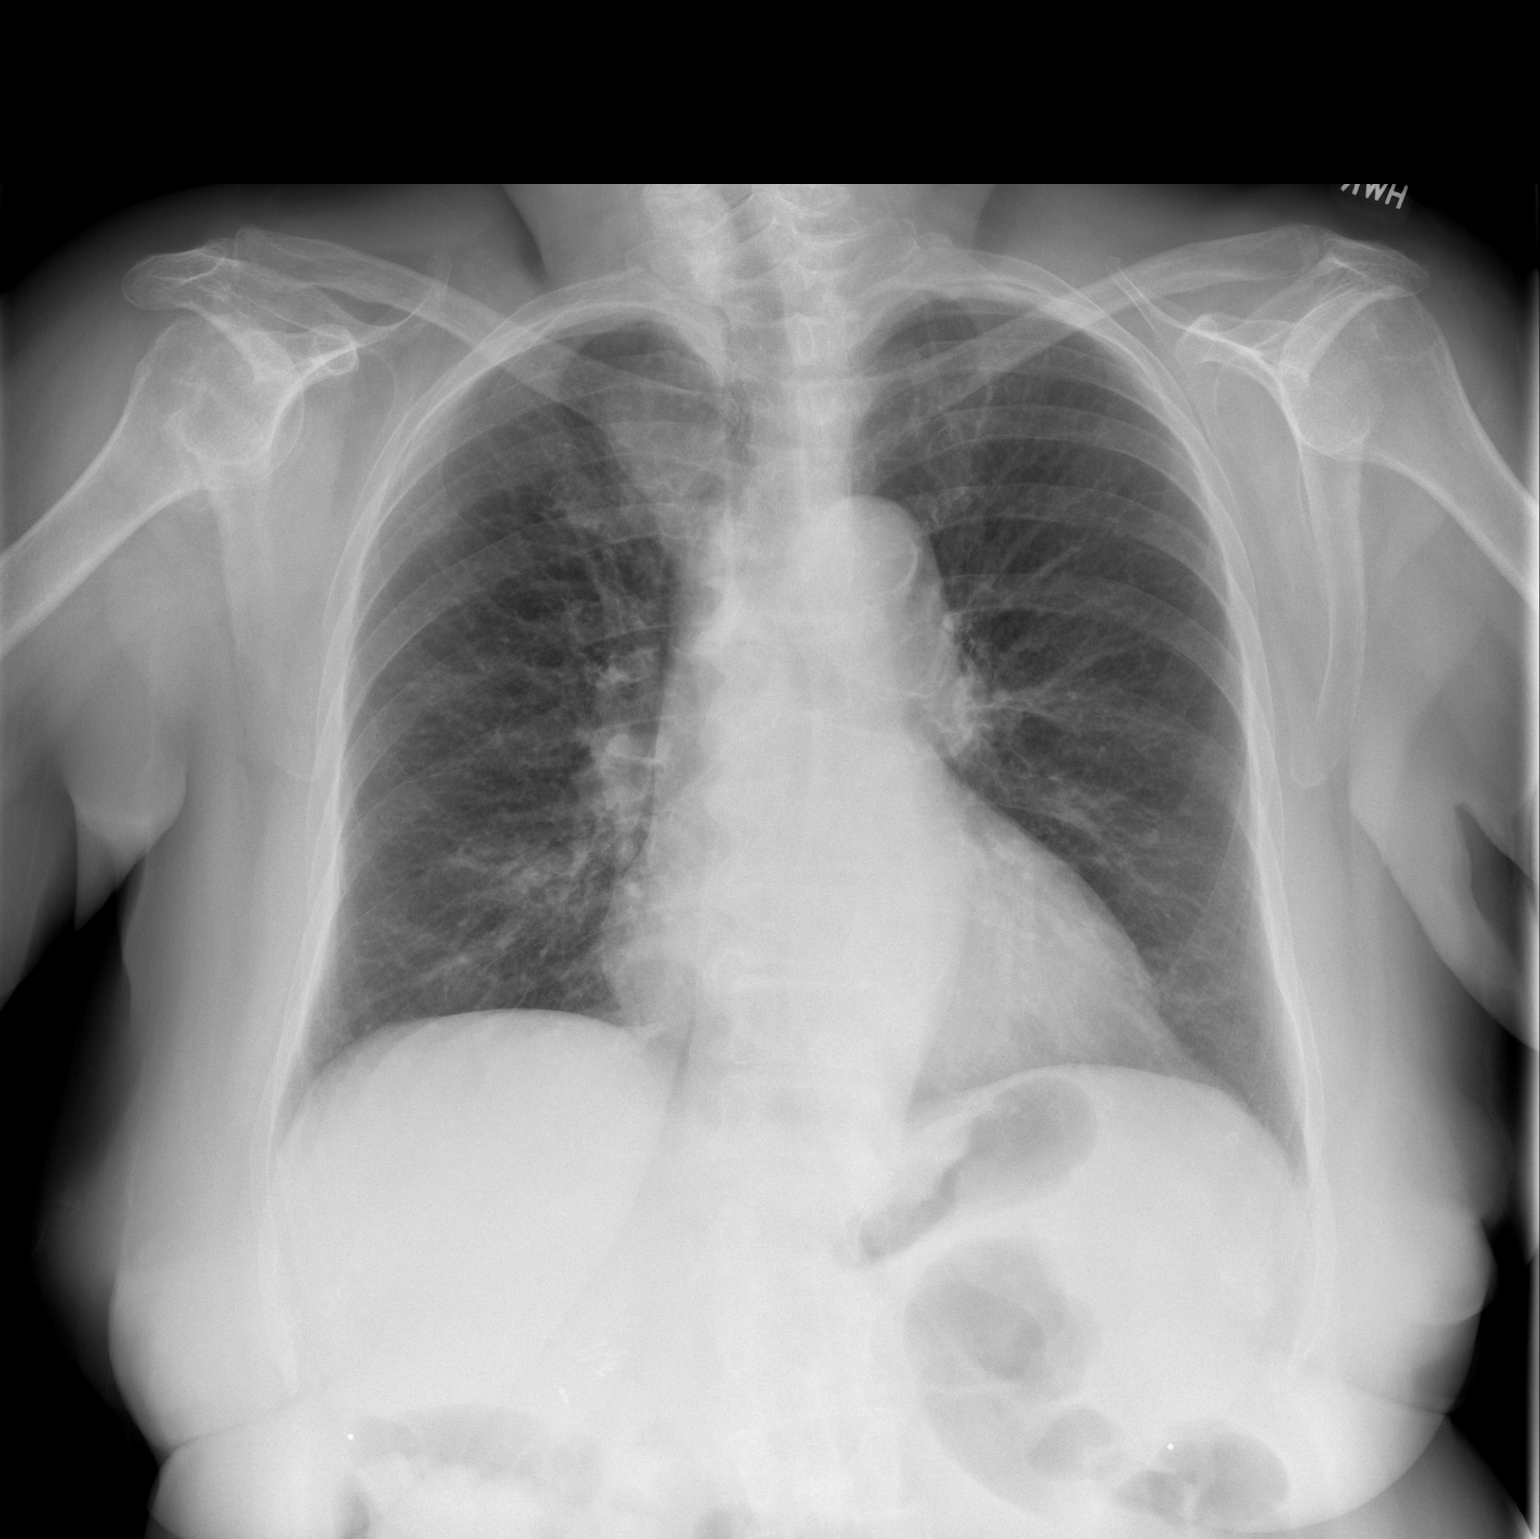

[1 of 1 positions shown; findings below may reference images not displayed]

FINDINGS: The heart size is normal. Atherosclerotic disease. There is no
pleural effusion identified. Coarsened interstitial markings are
identified bilaterally. The exam was repeated with bilateral nipple
markers. The nodular density in the left base seen on the previous
radiograph is not seen on the current exam.
IMPRESSION: 1. No acute cardiopulmonary abnormalities.
2. Atherosclerosis.

## 2015-11-05 ENCOUNTER — Other Ambulatory Visit (INDEPENDENT_AMBULATORY_CARE_PROVIDER_SITE_OTHER): Payer: Medicare Other

## 2015-11-05 ENCOUNTER — Encounter: Payer: Self-pay | Admitting: Internal Medicine

## 2015-11-05 ENCOUNTER — Ambulatory Visit (INDEPENDENT_AMBULATORY_CARE_PROVIDER_SITE_OTHER): Payer: Medicare Other | Admitting: Internal Medicine

## 2015-11-05 VITALS — BP 130/70 | HR 82 | Temp 98.5°F | Ht 61.0 in | Wt 185.0 lb

## 2015-11-05 DIAGNOSIS — G8929 Other chronic pain: Secondary | ICD-10-CM

## 2015-11-05 DIAGNOSIS — I10 Essential (primary) hypertension: Secondary | ICD-10-CM

## 2015-11-05 DIAGNOSIS — R5383 Other fatigue: Secondary | ICD-10-CM

## 2015-11-05 DIAGNOSIS — M25569 Pain in unspecified knee: Secondary | ICD-10-CM | POA: Diagnosis not present

## 2015-11-05 DIAGNOSIS — R7309 Other abnormal glucose: Secondary | ICD-10-CM

## 2015-11-05 DIAGNOSIS — M5440 Lumbago with sciatica, unspecified side: Secondary | ICD-10-CM | POA: Diagnosis not present

## 2015-11-05 LAB — CBC WITH DIFFERENTIAL/PLATELET
BASOS PCT: 0.5 % (ref 0.0–3.0)
Basophils Absolute: 0 10*3/uL (ref 0.0–0.1)
Eosinophils Absolute: 0.2 10*3/uL (ref 0.0–0.7)
Eosinophils Relative: 3.6 % (ref 0.0–5.0)
HEMATOCRIT: 44.5 % (ref 36.0–46.0)
Hemoglobin: 15.1 g/dL — ABNORMAL HIGH (ref 12.0–15.0)
LYMPHS ABS: 2 10*3/uL (ref 0.7–4.0)
LYMPHS PCT: 30.4 % (ref 12.0–46.0)
MCHC: 34 g/dL (ref 30.0–36.0)
MCV: 92.6 fl (ref 78.0–100.0)
MONOS PCT: 9.1 % (ref 3.0–12.0)
Monocytes Absolute: 0.6 10*3/uL (ref 0.1–1.0)
NEUTROS ABS: 3.7 10*3/uL (ref 1.4–7.7)
Neutrophils Relative %: 56.4 % (ref 43.0–77.0)
PLATELETS: 217 10*3/uL (ref 150.0–400.0)
RBC: 4.81 Mil/uL (ref 3.87–5.11)
RDW: 12.6 % (ref 11.5–15.5)
WBC: 6.6 10*3/uL (ref 4.0–10.5)

## 2015-11-05 LAB — BASIC METABOLIC PANEL
BUN: 16 mg/dL (ref 6–23)
CO2: 29 mEq/L (ref 19–32)
Calcium: 9.8 mg/dL (ref 8.4–10.5)
Chloride: 104 mEq/L (ref 96–112)
Creatinine, Ser: 0.6 mg/dL (ref 0.40–1.20)
GFR: 102.14 mL/min (ref >60.00)
GLUCOSE: 98 mg/dL (ref 70–99)
POTASSIUM: 4.3 meq/L (ref 3.5–5.1)
SODIUM: 142 meq/L (ref 135–145)

## 2015-11-05 LAB — HEMOGLOBIN A1C: Hgb A1c MFr Bld: 5.5 % (ref 4.6–6.5)

## 2015-11-05 LAB — TSH: TSH: 1.99 u[IU]/mL (ref 0.35–4.50)

## 2015-11-05 LAB — VITAMIN B12: Vitamin B-12: 283 pg/mL (ref 211–911)

## 2015-11-05 NOTE — Progress Notes (Signed)
Subjective:  Patient ID: Kathleen Mejia, female    DOB: 1933-09-22  Age: 82 y.o. MRN: [id number]  CC: No chief complaint on file.   HPI Kathleen Mejia presents for HTN, obesity, dyslipidemia f/u  Outpatient Prescriptions Prior to Visit  Medication Sig Dispense Refill  . Ascorbic Acid (VITAMIN C PO) Take 1 tablet by mouth daily.    . bumetanide (BUMEX) 0.5 MG tablet TAKE 1 TABLET (0.5 MG TOTAL) BY MOUTH DAILY. 90 tablet 3  . Cod Liver Oil 1000 MG CAPS Take 1 capsule by mouth 2 (two) times daily.    . Coenzyme Q10 (CO Q 10 PO) Take 1 capsule by mouth daily.    [name] FLAXSEED, LINSEED, PO Take by mouth daily. PATIENT SPRINKLE FLAX SEEDS ONTO HER CEREAL DAILY    . Multiple Minerals (MULTI-MINERALS PO) Take 1 tablet by mouth daily.    [name] telmisartan (MICARDIS) 40 MG tablet TAKE 1 TABLET (40 MG TOTAL) BY MOUTH DAILY. 90 tablet 3  . vitamin C (ASCORBIC ACID) 500 MG tablet Take 500 mg by mouth daily.     No facility-administered medications prior to visit.    ROS Review of Systems  Constitutional: Positive for fatigue and unexpected weight change. Negative for chills, activity change and appetite change.  HENT: Negative for congestion, mouth sores and sinus pressure.   Eyes: Negative for visual disturbance.  Respiratory: Negative for cough and chest tightness.   Gastrointestinal: Negative for nausea and abdominal pain.  Genitourinary: Negative for frequency, difficulty urinating and vaginal pain.  Musculoskeletal: Positive for arthralgias and gait problem. Negative for back pain.  Skin: Negative for pallor and rash.  Neurological: Negative for dizziness, tremors, weakness, numbness and headaches.  Psychiatric/Behavioral: Negative for confusion and sleep disturbance.    Objective:  BP 130/70 mmHg  Pulse 82  Temp(Src) 98.5 F (36.9 C) (Oral)  Ht 5\' 1"  (1.549 m)  Wt 185 lb (83.915 kg)  BMI 34.97 kg/m2  SpO2 92%  BP Readings from Last 3 Encounters:  11/05/15 130/70  07/08/15 120/78    05/05/15 130/68    Wt Readings from Last 3 Encounters:  11/05/15 185 lb (83.915 kg)  07/08/15 181 lb (82.101 kg)  05/05/15 181 lb (82.101 kg)    Physical Exam  Constitutional: She appears well-developed. No distress.  HENT:  Head: Normocephalic.  Right Ear: External ear normal.  Left Ear: External ear normal.  Nose: Nose normal.  Mouth/Throat: Oropharynx is clear and moist.  Eyes: Conjunctivae are normal. Pupils are equal, round, and reactive to light. Right eye exhibits no discharge. Left eye exhibits no discharge.  Neck: Normal range of motion. Neck supple. No JVD present. No tracheal deviation present. No thyromegaly present.  Cardiovascular: Normal rate, regular rhythm and normal heart sounds.   Pulmonary/Chest: No stridor. No respiratory distress. She has no wheezes.  Abdominal: Soft. Bowel sounds are normal. She exhibits no distension and no mass. There is no tenderness. There is no rebound and no guarding.  Musculoskeletal: She exhibits tenderness. She exhibits no edema.  Lymphadenopathy:    She has no cervical adenopathy.  Neurological: She displays normal reflexes. No cranial nerve deficit. She exhibits normal muscle tone. Coordination normal.  Skin: No rash noted. No erythema.  Psychiatric: She has a normal mood and affect. Her behavior is normal. Judgment and thought content normal.  difficulty walking, stepping up  Lab Results  Component Value Date   WBC 7.9 07/08/2015   HGB 14.9 07/08/2015   HCT 44.9 07/08/2015  PLT 232.0 07/08/2015   GLUCOSE 110* 07/08/2015   CHOL 175 11/27/2014   TRIG 103.0 11/27/2014   HDL 50.40 11/27/2014   LDLDIRECT 139.3 02/21/2008   LDLCALC 104* 11/27/2014   ALT 18 03/29/2015   AST 17 03/29/2015   NA 140 07/08/2015   K 4.2 07/08/2015   CL 103 07/08/2015   CREATININE 0.61 07/08/2015   BUN 11 07/08/2015   CO2 29 07/08/2015   TSH 1.81 03/29/2015   HGBA1C 5.4 11/27/2014   MICROALBUR 0.4 06/25/2008    Mm Digital Screening  Bilateral  08/17/2015  CLINICAL DATA:  Screening. EXAM: DIGITAL SCREENING BILATERAL MAMMOGRAM WITH CAD COMPARISON:  Previous exam(s). ACR Breast Density Category b: There are scattered areas of fibroglandular density. FINDINGS: There are no findings suspicious for malignancy. Images were processed with CAD. IMPRESSION: No mammographic evidence of malignancy. A result letter of this screening mammogram will be mailed directly to the patient. RECOMMENDATION: Screening mammogram in one year. (Code:SM-B-01Y) BI-RADS CATEGORY  1: Negative. Electronically Signed   By: Altamese Cabal M.D.   On: 08/17/2015 13:50    Assessment & Plan:   There are no diagnoses linked to this encounter. I am having Ms. Moll maintain her Ascorbic Acid (VITAMIN C PO), Coenzyme Q10 (CO Q 10 PO), (FLAXSEED, LINSEED, PO), telmisartan, Cod Liver Oil, Multiple Minerals (MULTI-MINERALS PO), vitamin C, bumetanide, aspirin, and Zinc Sulfate (ZINC 15 PO).  Meds ordered this encounter  Medications  . aspirin 81 MG tablet    Sig: Take 81 mg by mouth daily.  . Zinc Sulfate (ZINC 15 PO)    Sig: Take by mouth.     Follow-up: No Follow-up on file.  Walker Kehr, MD

## 2015-11-05 NOTE — Assessment & Plan Note (Signed)
Chronic OA 

## 2015-11-05 NOTE — Assessment & Plan Note (Signed)
Labs Start walking Nap after lunch

## 2015-11-05 NOTE — Progress Notes (Signed)
Pre visit review using our clinic review tool, if applicable. No additional management support is needed unless otherwise documented below in the visit note. 

## 2015-11-05 NOTE — Assessment & Plan Note (Signed)
Bumex, Micardis 

## 2015-11-05 NOTE — Assessment & Plan Note (Signed)
Doing fair 

## 2016-01-07 NOTE — Progress Notes (Addendum)
Subjective:   Kathleen Mejia is a 82 y.o. female who presents for Medicare Annual (Subsequent) preventive examination.  Review of Systems:  The Patient was informed that the wellness visit is to identify future health risk and educate and initiate measures that can reduce risk for increased disease through the lifespan.    Describes her health has good to great   NO ROS; Medicare Wellness Visit Last OV:  0/2017 Labs completed: 11/05/2015  A1c 5.5;  B12 low; To start otc b12 daily; States it made her constipated; only tried it x 2 weeks and stopped  Sob; not now;  Hyperlipidemia/ managed level  HTN; elevated today but will check at home and bring in reading when she sees Dr. Alain Marion  Never smoked but spouse smoked; died many years ago from MI Kneecap on right hurts; with a cane today but doesn't use everyday  Lifestyle review and risk: (family had pancreatic cancer; htn)  States mother had breast cancer and she continues to have her breast exam every year   Psychosocial:  Lives in a one level home Stopped driving x 2 year ago; car kept breaking down; son wanted her to stop Son took over some of her needs  2; boy and girl twins; She lives in [place] alone x 26 years;  They have birthday luncheons; losing some friends   Tobacco: no or smoking cessation discussed: never   How many drinks do you have per week?  none  Medications Stopped taking B12  Due to constipation;  Was always an early raiser;    BMI: 35  [place] and kale; does smoothies;  Eats a lot of fruit  Varies; some days just fruit; smoothie; cherries; Doesn't eat a lot of bread; Soup and cheese for lunch Dinner does not eat after 5;30 between 3 and 4;30 May eat yogurt at hs  Feels like she does need more protein; discussed ways of getting more protein in or at least have a protein every meal;  Discussed her B12 was dropping;   Teeth or Denture issues?  Went x 6 months ago; no issues   Exercise;     Not doing anymore/ loved to go to the park but now can't drive there Was walking a lot when she had her car Doesn't not have a car to go where she used  Get 30; of exercise; cleaning and is active during the day   [place];  Lives at home alone; not fearful Son is not retired;  Fall hx; no; dislocated shoulder; fell in kitchen; x 82 yo Gait: with cane today Given education on "Fall Prevention in the Home" for more safety tips the patient can apply as appropriate.  Long term goal is to "age in place" or undecided   Safety features reviewed for safe community; firearms if in the home; smoke alarms; sun protection when outside (is not in the sun very much)   Driving difficulties or accidents; difficulty keeping up a car   Mental Health:  Any emotional problems? Anxious, depressed, irritable, sad or blue?  Moments; deal with it  Denies feeling depressed or hopeless; voices pleasure in daily life How many social activities have you been engaged in within the last 2 weeks? Still has social gathering   Pain: no constant;  Cognitive;  Memory; Can repeat stories but children do as well  Manages checkbook, medications; no failures of task Ad8 score reviewed for issues;  Issues making decisions; no  Less interest in  hobbies / activities" no  Repeats questions, stories; family complaining: NO  Trouble using ordinary gadgets; microwave; computer: no  Forgets the month or year: no  Mismanaging finances: no  Missing apt: no but does write them down  Daily problems with thinking of memory NO Ad8 score is 0    Sleep pattern changes; sleeps well Changed BP pill to evening  Urinary or fecal incontinence reviewed: no  Advanced Directive addressed; Completed when spouse died  Counseling Health Maintenance Gaps:  Colonoscopy; had in [place]; came to [place] x 82 yo; no repeat  EKG: 05/2015 Mam/mogram: 08/2015 normal Dexa/ 06/2013 Osteopenia with lowest T score -1.6 at the femoral  neck States she takes Vit D but recommended 1000 q day  Hearing: no problem  Ophthalmology exam goes every year No issues    Immunizations Due: (Vaccines reviewed and educated regarding any overdue)  Declining vaccination TD due / declined Zostavax DUE- declined  PCV 13 DUE - declined    Current Care Team reviewed and updated   Education provided and lifestyle risk discussed   All Health Maintenance Gaps Reviewed for closure   Cardiac Risk Factors include: advanced age (>22men, >34 women);dyslipidemia;hypertension;obesity (BMI >30kg/m2);sedentary lifestyle    Objective:     Vitals: BP 140/90   Ht 5\' 1"  (1.549 m)   Wt 186 lb (84.4 kg)   BMI 35.14 kg/m   Body mass index is 35.14 kg/m.   Tobacco History  Smoking Status  . Never Smoker  Smokeless Tobacco  . Not on file     Counseling given: Yes   Past Medical History:  Diagnosis Date  . Dislocated shoulder 2012   right  . Fatty liver   . Gallstone    1.5 cm  . HTN (hypertension)   . Hyperlipidemia   . LBP (low back pain)   . Osteoarthritis   . Rosacea   . Shortness of breath    with activities and exertion   Past Surgical History:  Procedure Laterality Date  . CHOLECYSTECTOMY N/A 03/27/2014   Procedure: LAPAROSCOPIC CHOLECYSTECTOMY WITH INTRAOPERATIVE CHOLANGIOGRAM;  Surgeon: Fanny Skates, MD;  Location: [facility];  Service: General;  Laterality: N/A;  . TONSILLECTOMY     Family History  Problem Relation Age of Onset  . Pancreatic cancer Mother   . Hypertension Mother    History  Sexual Activity  . Sexual activity: Not on file    Outpatient Encounter Prescriptions as of 01/10/2016  Medication Sig  . Ascorbic Acid (VITAMIN C PO) Take 1 tablet by mouth daily.  [name] aspirin 81 MG tablet Take 81 mg by mouth daily.  . bumetanide (BUMEX) 0.5 MG tablet TAKE 1 TABLET (0.5 MG TOTAL) BY MOUTH DAILY.  [name] Cod Liver Oil 1000 MG CAPS Take 1 capsule by mouth 2 (two) times daily.  . Coenzyme Q10 (CO Q 10 PO) Take  1 capsule by mouth daily.  [name] FLAXSEED, LINSEED, PO Take by mouth daily. PATIENT SPRINKLE FLAX SEEDS ONTO HER CEREAL DAILY  . Multiple Minerals (MULTI-MINERALS PO) Take 1 tablet by mouth daily.  [name] telmisartan (MICARDIS) 40 MG tablet TAKE 1 TABLET (40 MG TOTAL) BY MOUTH DAILY.  . vitamin C (ASCORBIC ACID) 500 MG tablet Take 500 mg by mouth daily.  . Zinc Sulfate (ZINC 15 PO) Take by mouth.   No facility-administered encounter medications on file as of 01/10/2016.     Activities of Daily Living In your present state of health, do you have any difficulty performing the following activities:  01/10/2016  Hearing? N  Vision? N  Difficulty concentrating or making decisions? N  Walking or climbing stairs? N  Dressing or bathing? N  Doing errands, shopping? N  Preparing Food and eating ? N  Using the Toilet? N  In the past six months, have you accidently leaked urine? N  Do you have problems with loss of bowel control? N  Managing your Medications? N  Managing your Finances? N  Housekeeping or managing your Housekeeping? N  Some recent data might be hidden    Patient Care Team: Cassandria Anger, MD as PCP - General    Assessment:     Declines all vaccinations  Did have a conservation regarding her driving; stated that her son and dtr "didn't feel she should drive anymore" This has limited her socially and impacted her health because she can't go and walk. Discussed driving test for Senior and she can try this and see how she does; Will discuss with sone the impact and loss to her freedom; reports on accidents when driving; children felt she was getting to "old" to drive.   Also discussed AL or area where she had more access;   BP elevated today but will check BP periodically and bring in to the office at her next visit    Exercise Activities and Dietary recommendations Current Exercise Habits: Home exercise routine, Type of exercise: walking, Time (Minutes): 30, Frequency  (Times/Week): 3, Weekly Exercise (Minutes/Week): 90, Intensity: Moderate (housecleaning but difficulty to quantify )  Goals    . patient          Patient would like to remodel your bathroom      Fall Risk Fall Risk  01/10/2016 07/29/2014  Falls in the past year? No No   Depression Screen PHQ 2/9 Scores 01/10/2016 07/29/2014  PHQ - 2 Score 0 0     Cognitive Testing MMSE - Mini Mental State Exam 01/10/2016  Not completed: (No Data)   Not noted today; Engaged in assessment;    There is no immunization history for the selected administration types on file for this patient. Screening Tests Health Maintenance  Topic Date Due  . INFLUENZA VACCINE  05/04/2016 (Originally 01/04/2016)  . ZOSTAVAX  01/08/2017 (Originally 06/22/1995)  . TETANUS/TDAP  01/08/2017 (Originally 06/21/1952)  . PNA vac Low Risk Adult (1 of 2 - PCV13) 01/08/2017 (Originally 06/21/2000)  . DEXA SCAN  Completed      PLAN: Will try to get more exercise; Stay busy  May try to add some protein to your diet as well discussed   Will have Vit D checked;  Discuss B12 with dr. Alain Marion   Agreed to check her BP at home and bring reading in at her next office visit  Discussed driving again; states she misses having her independence and going shopping on occasion; Stays home and sleeps more;  Feels Fatigue but there is a lack of engagement;  No auto accidents;  Suggested senior driving course to not safety or issues.  During the course of the visit the patient was educated and counseled about the following appropriate screening and preventive services:   Vaccines to include Pneumoccal, Influenza, Hepatitis B, Td, Zostavax, HCV/ declines vaccines  Electrocardiogram/ 03/2015  Cardiovascular Disease/ BP; BMI; will check BP at home   Colorectal cancer screening / do not see report/ states she has not repeated since she was in [place]   Bone density screening/ 06/2013  Osteopenia with lowest T score -1.6 at the femoral  neck  Diabetes screening/na  Glaucoma screening / just had eye checked and no issues   Mammography/ 08/2015 - neg  Nutrition counseling / stopped B12 po. Discussed adding more protein to her diet   Patient Instructions (the written plan) was given to the patient.   [id number], RN  01/10/2016    Medical screening examination/treatment/procedure(s) were performed by non-physician practitioner and as supervising physician I was immediately available for consultation/collaboration. I agree with above. Walker Kehr, MD

## 2016-01-10 ENCOUNTER — Ambulatory Visit (INDEPENDENT_AMBULATORY_CARE_PROVIDER_SITE_OTHER): Payer: Medicare Other

## 2016-01-10 VITALS — BP 140/90 | Ht 61.0 in | Wt 186.0 lb

## 2016-01-10 DIAGNOSIS — Z0000 Encounter for general adult medical examination without abnormal findings: Secondary | ICD-10-CM | POA: Diagnosis not present

## 2016-01-10 NOTE — Patient Instructions (Addendum)
Kathleen Mejia , Thank you for taking time to come for your Medicare Wellness Visit. I appreciate your ongoing commitment to your health goals. Please review the following plan we discussed and let me know if I can assist you in the future.   Will try to get more exercise; Stay busy  May try to add some protein to your diet as well discussed   Will have Vit D checked;  Discuss B12 with dr. Alain Marion    These are the goals we discussed: Goals    . patient          Patient would like to remodel your bathroom       This is a list of the screening recommended for you and due dates:  Health Maintenance  Topic Date Due  . Tetanus Vaccine  06[birth year]21/1954  . Shingles Vaccine  06/22/1995  . Pneumonia vaccines (1 of 2 - PCV13) 06/21/2000  . Flu Shot  05/04/2016*  . DEXA scan (bone density measurement)  Completed  *Topic was postponed. The date shown is not the original due date.        Fall Prevention in the Home  Falls can cause injuries. They can happen to people of all ages. There are many things you can do to make your home safe and to help prevent falls.  WHAT CAN I DO ON THE OUTSIDE OF MY HOME?  Regularly fix the edges of walkways and driveways and fix any cracks.  Remove anything that might make you trip as you walk through a door, such as a raised step or threshold.  Trim any bushes or trees on the path to your home.  Use bright outdoor lighting.  Clear any walking paths of anything that might make someone trip, such as rocks or tools.  Regularly check to see if handrails are loose or broken. Make sure that both sides of any steps have handrails.  Any raised decks and porches should have guardrails on the edges.  Have any leaves, snow, or ice cleared regularly.  Use sand or salt on walking paths during winter.  Clean up any spills in your garage right away. This includes oil or grease spills. WHAT CAN I DO IN THE BATHROOM?   Use night lights.  Install grab bars  by the toilet and in the tub and shower. Do not use towel bars as grab bars.  Use non-skid mats or decals in the tub or shower.  If you need to sit down in the shower, use a plastic, non-slip stool.  Keep the floor dry. Clean up any water that spills on the floor as soon as it happens.  Remove soap buildup in the tub or shower regularly.  Attach bath mats securely with double-sided non-slip rug tape.  Do not have throw rugs and other things on the floor that can make you trip. WHAT CAN I DO IN THE BEDROOM?  Use night lights.  Make sure that you have a light by your bed that is easy to reach.  Do not use any sheets or blankets that are too big for your bed. They should not hang down onto the floor.  Have a firm chair that has side arms. You can use this for support while you get dressed.  Do not have throw rugs and other things on the floor that can make you trip. WHAT CAN I DO IN THE KITCHEN?  Clean up any spills right away.  Avoid walking on wet floors.  Keep items  that you use a lot in easy-to-reach places.  If you need to reach something above you, use a strong step stool that has a grab bar.  Keep electrical cords out of the way.  Do not use floor polish or wax that makes floors slippery. If you must use wax, use non-skid floor wax.  Do not have throw rugs and other things on the floor that can make you trip. WHAT CAN I DO WITH MY STAIRS?  Do not leave any items on the stairs.  Make sure that there are handrails on both sides of the stairs and use them. Fix handrails that are broken or loose. Make sure that handrails are as long as the stairways.  Check any carpeting to make sure that it is firmly attached to the stairs. Fix any carpet that is loose or worn.  Avoid having throw rugs at the top or bottom of the stairs. If you do have throw rugs, attach them to the floor with carpet tape.  Make sure that you have a light switch at the top of the stairs and the  bottom of the stairs. If you do not have them, ask someone to add them for you. WHAT ELSE CAN I DO TO HELP PREVENT FALLS?  Wear shoes that:  Do not have high heels.  Have rubber bottoms.  Are comfortable and fit you well.  Are closed at the toe. Do not wear sandals.  If you use a stepladder:  Make sure that it is fully opened. Do not climb a closed stepladder.  Make sure that both sides of the stepladder are locked into place.  Ask someone to hold it for you, if possible.  Clearly mark and make sure that you can see:  Any grab bars or handrails.  First and last steps.  Where the edge of each step is.  Use tools that help you move around (mobility aids) if they are needed. These include:  Canes.  Walkers.  Scooters.  Crutches.  Turn on the lights when you go into a dark area. Replace any light bulbs as soon as they burn out.  Set up your furniture so you have a clear path. Avoid moving your furniture around.  If any of your floors are uneven, fix them.  If there are any pets around you, be aware of where they are.  Review your medicines with your doctor. Some medicines can make you feel dizzy. This can increase your chance of falling. Ask your doctor what other things that you can do to help prevent falls.   This information is not intended to replace advice given to you by your health care provider. Make sure you discuss any questions you have with your health care provider.   Document Released: 03/18/2009 Document Revised: 10/06/2014 Document Reviewed: 06/26/2014 Elsevier Interactive Patient Education 2016 [place] Maintenance, Female Adopting a healthy lifestyle and getting preventive care can go a long way to promote health and wellness. Talk with your health care provider about what schedule of regular examinations is right for you. This is a good chance for you to check in with your provider about disease prevention and staying healthy. In  between checkups, there are plenty of things you can do on your own. Experts have done a lot of research about which lifestyle changes and preventive measures are most likely to keep you healthy. Ask your health care provider for more information. WEIGHT AND DIET  Eat a healthy diet  Be  sure to include plenty of vegetables, fruits, low-fat dairy products, and lean protein.  Do not eat a lot of foods high in solid fats, added sugars, or salt.  Get regular exercise. This is one of the most important things you can do for your health.  Most adults should exercise for at least 150 minutes each week. The exercise should increase your heart rate and make you sweat (moderate-intensity exercise).  Most adults should also do strengthening exercises at least twice a week. This is in addition to the moderate-intensity exercise.  Maintain a healthy weight  Body mass index (BMI) is a measurement that can be used to identify possible weight problems. It estimates body fat based on height and weight. Your health care provider can help determine your BMI and help you achieve or maintain a healthy weight.  For females 37 years of age and older:   A BMI below 18.5 is considered underweight.  A BMI of 18.5 to 24.9 is normal.  A BMI of 25 to 29.9 is considered overweight.  A BMI of 30 and above is considered obese.  Watch levels of cholesterol and blood lipids  You should start having your blood tested for lipids and cholesterol at 80 years of age, then have this test every 5 years.  You may need to have your cholesterol levels checked more often if:  Your lipid or cholesterol levels are high.  You are older than 80 years of age.  You are at high risk for heart disease.  CANCER SCREENING   Lung Cancer  Lung cancer screening is recommended for adults 47-51 years old who are at high risk for lung cancer because of a history of smoking.  A yearly low-dose CT scan of the lungs is recommended  for people who:  Currently smoke.  Have quit within the past 15 years.  Have at least a 30-pack-year history of smoking. A pack year is smoking an average of one pack of cigarettes a day for 1 year.  Yearly screening should continue until it has been 15 years since you quit.  Yearly screening should stop if you develop a health problem that would prevent you from having lung cancer treatment.  Breast Cancer  Practice breast self-awareness. This means understanding how your breasts normally appear and feel.  It also means doing regular breast self-exams. Let your health care provider know about any changes, no matter how small.  If you are in your 20s or 30s, you should have a clinical breast exam (CBE) by a health care provider every 1-3 years as part of a regular health exam.  If you are 52 or older, have a CBE every year. Also consider having a breast X-ray (mammogram) every year.  If you have a family history of breast cancer, talk to your health care provider about genetic screening.  If you are at high risk for breast cancer, talk to your health care provider about having an MRI and a mammogram every year.  Breast cancer gene (BRCA) assessment is recommended for women who have family members with BRCA-related cancers. BRCA-related cancers include:  Breast.  Ovarian.  Tubal.  Peritoneal cancers.  Results of the assessment will determine the need for genetic counseling and BRCA1 and BRCA2 testing. Cervical Cancer Your health care provider may recommend that you be screened regularly for cancer of the pelvic organs (ovaries, uterus, and vagina). This screening involves a pelvic examination, including checking for microscopic changes to the surface of your cervix (Pap  test). You may be encouraged to have this screening done every 3 years, beginning at age 43.  For women ages 19-65, health care providers may recommend pelvic exams and Pap testing every 3 years, or they may  recommend the Pap and pelvic exam, combined with testing for human papilloma virus (HPV), every 5 years. Some types of HPV increase your risk of cervical cancer. Testing for HPV may also be done on women of any age with unclear Pap test results.  Other health care providers may not recommend any screening for nonpregnant women who are considered low risk for pelvic cancer and who do not have symptoms. Ask your health care provider if a screening pelvic exam is right for you.  If you have had past treatment for cervical cancer or a condition that could lead to cancer, you need Pap tests and screening for cancer for at least 20 years after your treatment. If Pap tests have been discontinued, your risk factors (such as having a new sexual partner) need to be reassessed to determine if screening should resume. Some women have medical problems that increase the chance of getting cervical cancer. In these cases, your health care provider may recommend more frequent screening and Pap tests. Colorectal Cancer  This type of cancer can be detected and often prevented.  Routine colorectal cancer screening usually begins at 80 years of age and continues through 80 years of age.  Your health care provider may recommend screening at an earlier age if you have risk factors for colon cancer.  Your health care provider may also recommend using home test kits to check for hidden blood in the stool.  A small camera at the end of a tube can be used to examine your colon directly (sigmoidoscopy or colonoscopy). This is done to check for the earliest forms of colorectal cancer.  Routine screening usually begins at age 59.  Direct examination of the colon should be repeated every 5-10 years through 79 years of age. However, you may need to be screened more often if early forms of precancerous polyps or small growths are found. Skin Cancer  Check your skin from head to toe regularly.  Tell your health care provider  about any new moles or changes in moles, especially if there is a change in a mole's shape or color.  Also tell your health care provider if you have a mole that is larger than the size of a pencil eraser.  Always use sunscreen. Apply sunscreen liberally and repeatedly throughout the day.  Protect yourself by wearing long sleeves, pants, a wide-brimmed hat, and sunglasses whenever you are outside. HEART DISEASE, DIABETES, AND HIGH BLOOD PRESSURE   High blood pressure causes heart disease and increases the risk of stroke. High blood pressure is more likely to develop in:  People who have blood pressure in the high end of the normal range (130-139/85-89 mm Hg).  People who are overweight or obese.  People who are African American.  If you are 81-65 years of age, have your blood pressure checked every 3-5 years. If you are 47 years of age or older, have your blood pressure checked every year. You should have your blood pressure measured twice--once when you are at a hospital or clinic, and once when you are not at a hospital or clinic. Record the average of the two measurements. To check your blood pressure when you are not at a hospital or clinic, you can use:  An automated blood pressure machine  at a pharmacy.  A home blood pressure monitor.  If you are between 27 years and 60 years old, ask your health care provider if you should take aspirin to prevent strokes.  Have regular diabetes screenings. This involves taking a blood sample to check your fasting blood sugar level.  If you are at a normal weight and have a low risk for diabetes, have this test once every three years after 80 years of age.  If you are overweight and have a high risk for diabetes, consider being tested at a younger age or more often. PREVENTING INFECTION  Hepatitis B  If you have a higher risk for hepatitis B, you should be screened for this virus. You are considered at high risk for hepatitis B if:  You were  born in a country where hepatitis B is common. Ask your health care provider which countries are considered high risk.  Your parents were born in a high-risk country, and you have not been immunized against hepatitis B (hepatitis B vaccine).  You have HIV or AIDS.  You use needles to inject street drugs.  You live with someone who has hepatitis B.  You have had sex with someone who has hepatitis B.  You get hemodialysis treatment.  You take certain medicines for conditions, including cancer, organ transplantation, and autoimmune conditions. Hepatitis C  Blood testing is recommended for:  Everyone born from [birth year] through 1963.  Anyone with known risk factors for hepatitis C. Sexually transmitted infections (STIs)  You should be screened for sexually transmitted infections (STIs) including gonorrhea and chlamydia if:  You are sexually active and are younger than 80 years of age.  You are older than 80 years of age and your health care provider tells you that you are at risk for this type of infection.  Your sexual activity has changed since you were last screened and you are at an increased risk for chlamydia or gonorrhea. Ask your health care provider if you are at risk.  If you do not have HIV, but are at risk, it may be recommended that you take a prescription medicine daily to prevent HIV infection. This is called pre-exposure prophylaxis (PrEP). You are considered at risk if:  You are sexually active and do not regularly use condoms or know the HIV status of your partner(s).  You take drugs by injection.  You are sexually active with a partner who has HIV. Talk with your health care provider about whether you are at high risk of being infected with HIV. If you choose to begin PrEP, you should first be tested for HIV. You should then be tested every 3 months for as long as you are taking PrEP.  PREGNANCY   If you are premenopausal and you may become pregnant, ask your  health care provider about preconception counseling.  If you may become pregnant, take 400 to 800 micrograms (mcg) of folic acid every day.  If you want to prevent pregnancy, talk to your health care provider about birth control (contraception). OSTEOPOROSIS AND MENOPAUSE   Osteoporosis is a disease in which the bones lose minerals and strength with aging. This can result in serious bone fractures. Your risk for osteoporosis can be identified using a bone density scan.  If you are 17 years of age or older, or if you are at risk for osteoporosis and fractures, ask your health care provider if you should be screened.  Ask your health care provider whether you should take a  calcium or vitamin D supplement to lower your risk for osteoporosis.  Menopause may have certain physical symptoms and risks.  Hormone replacement therapy may reduce some of these symptoms and risks. Talk to your health care provider about whether hormone replacement therapy is right for you.  HOME CARE INSTRUCTIONS   Schedule regular health, dental, and eye exams.  Stay current with your immunizations.   Do not use any tobacco products including cigarettes, chewing tobacco, or electronic cigarettes.  If you are pregnant, do not drink alcohol.  If you are breastfeeding, limit how much and how often you drink alcohol.  Limit alcohol intake to no more than 1 drink per day for nonpregnant women. One drink equals 12 ounces of beer, 5 ounces of wine, or 1 ounces of hard liquor.  Do not use street drugs.  Do not share needles.  Ask your health care provider for help if you need support or information about quitting drugs.  Tell your health care provider if you often feel depressed.  Tell your health care provider if you have ever been abused or do not feel safe at home.   This information is not intended to replace advice given to you by your health care provider. Make sure you discuss any questions you have with  your health care provider.   Document Released: 12/05/2010 Document Revised: 06/12/2014 Document Reviewed: 04/23/2013 Elsevier Interactive Patient Education [employer].

## 2016-03-06 ENCOUNTER — Other Ambulatory Visit (INDEPENDENT_AMBULATORY_CARE_PROVIDER_SITE_OTHER): Payer: Medicare Other

## 2016-03-06 ENCOUNTER — Encounter: Payer: Self-pay | Admitting: Internal Medicine

## 2016-03-06 ENCOUNTER — Ambulatory Visit (INDEPENDENT_AMBULATORY_CARE_PROVIDER_SITE_OTHER): Payer: Medicare Other | Admitting: Internal Medicine

## 2016-03-06 VITALS — BP 140/78 | HR 87 | Wt 187.0 lb

## 2016-03-06 DIAGNOSIS — D582 Other hemoglobinopathies: Secondary | ICD-10-CM

## 2016-03-06 DIAGNOSIS — I10 Essential (primary) hypertension: Secondary | ICD-10-CM

## 2016-03-06 DIAGNOSIS — I480 Paroxysmal atrial fibrillation: Secondary | ICD-10-CM | POA: Diagnosis not present

## 2016-03-06 DIAGNOSIS — E785 Hyperlipidemia, unspecified: Secondary | ICD-10-CM | POA: Diagnosis not present

## 2016-03-06 DIAGNOSIS — R7309 Other abnormal glucose: Secondary | ICD-10-CM

## 2016-03-06 LAB — CBC WITH DIFFERENTIAL/PLATELET
BASOS PCT: 0.8 % (ref 0.0–3.0)
Basophils Absolute: 0 10*3/uL (ref 0.0–0.1)
Eosinophils Absolute: 0.2 10*3/uL (ref 0.0–0.7)
Eosinophils Relative: 3.3 % (ref 0.0–5.0)
HEMATOCRIT: 44.5 % (ref 36.0–46.0)
Hemoglobin: 15.3 g/dL — ABNORMAL HIGH (ref 12.0–15.0)
LYMPHS PCT: 30.3 % (ref 12.0–46.0)
Lymphs Abs: 1.7 10*3/uL (ref 0.7–4.0)
MCHC: 34.5 g/dL (ref 30.0–36.0)
MCV: 93.4 fl (ref 78.0–100.0)
MONOS PCT: 9 % (ref 3.0–12.0)
Monocytes Absolute: 0.5 10*3/uL (ref 0.1–1.0)
NEUTROS ABS: 3.2 10*3/uL (ref 1.4–7.7)
Neutrophils Relative %: 56.6 % (ref 43.0–77.0)
PLATELETS: 223 10*3/uL (ref 150.0–400.0)
RBC: 4.77 Mil/uL (ref 3.87–5.11)
RDW: 12.4 % (ref 11.5–15.5)
WBC: 5.6 10*3/uL (ref 4.0–10.5)

## 2016-03-06 LAB — BASIC METABOLIC PANEL
BUN: 10 mg/dL (ref 6–23)
CALCIUM: 9.2 mg/dL (ref 8.4–10.5)
CO2: 31 mEq/L (ref 19–32)
CREATININE: 0.59 mg/dL (ref 0.40–1.20)
Chloride: 104 mEq/L (ref 96–112)
GFR: 104.05 mL/min (ref >60.00)
Glucose, Bld: 105 mg/dL — ABNORMAL HIGH (ref 70–99)
Potassium: 4.3 mEq/L (ref 3.5–5.1)
Sodium: 141 mEq/L (ref 135–145)

## 2016-03-06 NOTE — Assessment & Plan Note (Signed)
Labs

## 2016-03-06 NOTE — Assessment & Plan Note (Signed)
Chronic, not on statins - declined

## 2016-03-06 NOTE — Progress Notes (Signed)
Subjective:  Patient ID: Kathleen Mejia, female    DOB: Jan 01, 1934  Age: 82 y.o. MRN: [id number]  CC: No chief complaint on file.   HPI Kathleen Mejia presents for HTN, elevated Hgb and elevated sugar  Outpatient Medications Prior to Visit  Medication Sig Dispense Refill  . Ascorbic Acid (VITAMIN C PO) Take 1 tablet by mouth daily.    [name] aspirin 81 MG tablet Take 81 mg by mouth daily.    . bumetanide (BUMEX) 0.5 MG tablet TAKE 1 TABLET (0.5 MG TOTAL) BY MOUTH DAILY. 90 tablet 3  . Cod Liver Oil 1000 MG CAPS Take 1 capsule by mouth 2 (two) times daily.    . Coenzyme Q10 (CO Q 10 PO) Take 1 capsule by mouth daily.    [name] FLAXSEED, LINSEED, PO Take by mouth daily. PATIENT SPRINKLE FLAX SEEDS ONTO HER CEREAL DAILY    . Multiple Minerals (MULTI-MINERALS PO) Take 1 tablet by mouth daily.    [name] telmisartan (MICARDIS) 40 MG tablet TAKE 1 TABLET (40 MG TOTAL) BY MOUTH DAILY. 90 tablet 3  . vitamin C (ASCORBIC ACID) 500 MG tablet Take 500 mg by mouth daily.    . Zinc Sulfate (ZINC 15 PO) Take by mouth.     No facility-administered medications prior to visit.     ROS Review of Systems  Constitutional: Negative for activity change, appetite change, chills, fatigue and unexpected weight change.  HENT: Negative for congestion, mouth sores and sinus pressure.   Eyes: Negative for visual disturbance.  Respiratory: Negative for cough and chest tightness.   Gastrointestinal: Negative for abdominal pain and nausea.  Genitourinary: Negative for difficulty urinating, frequency and vaginal pain.  Musculoskeletal: Negative for back pain and gait problem.  Skin: Negative for pallor and rash.  Neurological: Negative for dizziness, tremors, weakness, numbness and headaches.  Psychiatric/Behavioral: Negative for confusion and sleep disturbance.    Objective:  BP 140/78   Pulse 87   Wt 187 lb (84.8 kg)   SpO2 97%   BMI 35.33 kg/m   BP Readings from Last 3 Encounters:  03/06/16 140/78  01/10/16  140/90  11/05/15 130/70    Wt Readings from Last 3 Encounters:  03/06/16 187 lb (84.8 kg)  01/10/16 186 lb (84.4 kg)  11/05/15 185 lb (83.9 kg)    Physical Exam  Constitutional: She appears well-developed. No distress.  HENT:  Head: Normocephalic.  Right Ear: External ear normal.  Left Ear: External ear normal.  Nose: Nose normal.  Mouth/Throat: Oropharynx is clear and moist.  Eyes: Conjunctivae are normal. Pupils are equal, round, and reactive to light. Right eye exhibits no discharge. Left eye exhibits no discharge.  Neck: Normal range of motion. Neck supple. No JVD present. No tracheal deviation present. No thyromegaly present.  Cardiovascular: Normal rate, regular rhythm and normal heart sounds.   Pulmonary/Chest: No stridor. No respiratory distress. She has no wheezes.  Abdominal: Soft. Bowel sounds are normal. She exhibits no distension and no mass. There is no tenderness. There is no rebound and no guarding.  Musculoskeletal: She exhibits tenderness. She exhibits no edema.  Lymphadenopathy:    She has no cervical adenopathy.  Neurological: She displays normal reflexes. No cranial nerve deficit. She exhibits normal muscle tone. Coordination abnormal.  Skin: No rash noted. No erythema.  Psychiatric: She has a normal mood and affect. Her behavior is normal. Judgment and thought content normal.  Obese  Lab Results  Component Value Date   WBC 6.6 11/05/2015  HGB 15.1 (H) 11/05/2015   HCT 44.5 11/05/2015   PLT 217.0 11/05/2015   GLUCOSE 98 11/05/2015   CHOL 175 11/27/2014   TRIG 103.0 11/27/2014   HDL 50.40 11/27/2014   LDLDIRECT 139.3 02/21/2008   LDLCALC 104 (H) 11/27/2014   ALT 18 03/29/2015   AST 17 03/29/2015   NA 142 11/05/2015   K 4.3 11/05/2015   CL 104 11/05/2015   CREATININE 0.60 11/05/2015   BUN 16 11/05/2015   CO2 29 11/05/2015   TSH 1.99 11/05/2015   HGBA1C 5.5 11/05/2015   MICROALBUR 0.4 06/25/2008    Mm Digital Screening Bilateral  Result  Date: 08/17/2015 CLINICAL DATA:  Screening. EXAM: DIGITAL SCREENING BILATERAL MAMMOGRAM WITH CAD COMPARISON:  Previous exam(s). ACR Breast Density Category b: There are scattered areas of fibroglandular density. FINDINGS: There are no findings suspicious for malignancy. Images were processed with CAD. IMPRESSION: No mammographic evidence of malignancy. A result letter of this screening mammogram will be mailed directly to the patient. RECOMMENDATION: Screening mammogram in one year. (Code:SM-B-01Y) BI-RADS CATEGORY  1: Negative. Electronically Signed   By: Altamese Cabal M.D.   On: 08/17/2015 13:50    Assessment & Plan:   There are no diagnoses linked to this encounter. I am having Kathleen Mejia maintain her Ascorbic Acid (VITAMIN C PO), Coenzyme Q10 (CO Q 10 PO), (FLAXSEED, LINSEED, PO), telmisartan, Cod Liver Oil, Multiple Minerals (MULTI-MINERALS PO), vitamin C, bumetanide, aspirin, and Zinc Sulfate (ZINC 15 PO).  No orders of the defined types were placed in this encounter.    Follow-up: No Follow-up on file.  Walker Kehr, MD

## 2016-03-06 NOTE — Assessment & Plan Note (Signed)
Bumex, Micardis

## 2016-03-06 NOTE — Assessment & Plan Note (Signed)
She is adamant about taking nothing except aspirin.

## 2016-03-06 NOTE — Progress Notes (Signed)
Pre visit review using our clinic review tool, if applicable. No additional management support is needed unless otherwise documented below in the visit note. 

## 2016-05-30 ENCOUNTER — Other Ambulatory Visit: Payer: Self-pay | Admitting: Internal Medicine

## 2016-07-10 ENCOUNTER — Ambulatory Visit (INDEPENDENT_AMBULATORY_CARE_PROVIDER_SITE_OTHER): Payer: Medicare Other | Admitting: Internal Medicine

## 2016-07-10 ENCOUNTER — Other Ambulatory Visit (INDEPENDENT_AMBULATORY_CARE_PROVIDER_SITE_OTHER): Payer: Medicare Other

## 2016-07-10 ENCOUNTER — Encounter: Payer: Self-pay | Admitting: Internal Medicine

## 2016-07-10 DIAGNOSIS — D582 Other hemoglobinopathies: Secondary | ICD-10-CM

## 2016-07-10 DIAGNOSIS — E6609 Other obesity due to excess calories: Secondary | ICD-10-CM

## 2016-07-10 DIAGNOSIS — R945 Abnormal results of liver function studies: Secondary | ICD-10-CM

## 2016-07-10 DIAGNOSIS — I480 Paroxysmal atrial fibrillation: Secondary | ICD-10-CM

## 2016-07-10 DIAGNOSIS — R7309 Other abnormal glucose: Secondary | ICD-10-CM

## 2016-07-10 DIAGNOSIS — I10 Essential (primary) hypertension: Secondary | ICD-10-CM

## 2016-07-10 DIAGNOSIS — Z6835 Body mass index (BMI) 35.0-35.9, adult: Secondary | ICD-10-CM

## 2016-07-10 DIAGNOSIS — IMO0001 Reserved for inherently not codable concepts without codable children: Secondary | ICD-10-CM

## 2016-07-10 LAB — BASIC METABOLIC PANEL
BUN: 18 mg/dL (ref 6–23)
CALCIUM: 9.7 mg/dL (ref 8.4–10.5)
CO2: 29 mEq/L (ref 19–32)
CREATININE: 0.63 mg/dL (ref 0.40–1.20)
Chloride: 104 mEq/L (ref 96–112)
GFR: 96.38 mL/min (ref >60.00)
GLUCOSE: 104 mg/dL — AB (ref 70–99)
Potassium: 4.2 mEq/L (ref 3.5–5.1)
Sodium: 142 mEq/L (ref 135–145)

## 2016-07-10 LAB — HEPATIC FUNCTION PANEL
ALBUMIN: 4.3 g/dL (ref 3.5–5.2)
ALT: 21 U/L (ref 0–35)
AST: 17 U/L (ref 0–37)
Alkaline Phosphatase: 79 U/L (ref 39–117)
BILIRUBIN TOTAL: 0.5 mg/dL (ref 0.2–1.2)
Bilirubin, Direct: 0.1 mg/dL (ref 0.0–0.3)
Total Protein: 7.6 g/dL (ref 6.0–8.3)

## 2016-07-10 LAB — CBC WITH DIFFERENTIAL/PLATELET
BASOS PCT: 0.5 % (ref 0.0–3.0)
Basophils Absolute: 0 10*3/uL (ref 0.0–0.1)
EOS PCT: 2.6 % (ref 0.0–5.0)
Eosinophils Absolute: 0.1 10*3/uL (ref 0.0–0.7)
HEMATOCRIT: 47 % — AB (ref 36.0–46.0)
HEMOGLOBIN: 16 g/dL — AB (ref 12.0–15.0)
LYMPHS PCT: 24.7 % (ref 12.0–46.0)
Lymphs Abs: 1.4 10*3/uL (ref 0.7–4.0)
MCHC: 34.1 g/dL (ref 30.0–36.0)
MCV: 95.1 fl (ref 78.0–100.0)
Monocytes Absolute: 0.6 10*3/uL (ref 0.1–1.0)
Monocytes Relative: 10.3 % (ref 3.0–12.0)
NEUTROS ABS: 3.5 10*3/uL (ref 1.4–7.7)
Neutrophils Relative %: 61.9 % (ref 43.0–77.0)
Platelets: 196 10*3/uL (ref 150.0–400.0)
RBC: 4.94 Mil/uL (ref 3.87–5.11)
RDW: 12.5 % (ref 11.5–15.5)
WBC: 5.7 10*3/uL (ref 4.0–10.5)

## 2016-07-10 LAB — HEMOGLOBIN A1C: HEMOGLOBIN A1C: 5.5 % (ref 4.6–6.5)

## 2016-07-10 NOTE — Assessment & Plan Note (Signed)
Wt loss - goal 150 lbs Micardis Bumex

## 2016-07-10 NOTE — Progress Notes (Signed)
Subjective:  Patient ID: Kathleen Mejia, female    DOB: 11-23-1933  Age: 83 y.o. MRN: [id number]  CC: No chief complaint on file.   HPI Kathleen Mejia presents for HTN, OA, gait issues f/u  Outpatient Medications Prior to Visit  Medication Sig Dispense Refill  . Ascorbic Acid (VITAMIN C PO) Take 1 tablet by mouth daily.    [name] aspirin 81 MG tablet Take 81 mg by mouth daily.    . bumetanide (BUMEX) 0.5 MG tablet TAKE 1 TABLET (0.5 MG TOTAL) BY MOUTH DAILY. 90 tablet 3  . Cod Liver Oil 1000 MG CAPS Take 1 capsule by mouth 2 (two) times daily.    . Coenzyme Q10 (CO Q 10 PO) Take 1 capsule by mouth daily.    [name] FLAXSEED, LINSEED, PO Take by mouth daily. PATIENT SPRINKLE FLAX SEEDS ONTO HER CEREAL DAILY    . Multiple Minerals (MULTI-MINERALS PO) Take 1 tablet by mouth daily.    [name] telmisartan (MICARDIS) 40 MG tablet TAKE 1 TABLET BY MOUTH EVERY DAY 90 tablet 1  . vitamin C (ASCORBIC ACID) 500 MG tablet Take 500 mg by mouth daily.    . Zinc Sulfate (ZINC 15 PO) Take by mouth.     No facility-administered medications prior to visit.     ROS Review of Systems  Constitutional: Negative for activity change, appetite change, chills, fatigue and unexpected weight change.  HENT: Negative for congestion, mouth sores and sinus pressure.   Eyes: Negative for visual disturbance.  Respiratory: Negative for cough and chest tightness.   Gastrointestinal: Negative for abdominal pain and nausea.  Genitourinary: Negative for difficulty urinating, frequency and vaginal pain.  Musculoskeletal: Positive for arthralgias and gait problem. Negative for back pain.  Skin: Negative for pallor and rash.  Neurological: Negative for dizziness, tremors, weakness, numbness and headaches.  Psychiatric/Behavioral: Negative for confusion, sleep disturbance and suicidal ideas. The patient is nervous/anxious.     Objective:  BP (!) 142/80   Pulse (!) 105   Temp 98.1 F (36.7 C) (Oral)   Resp 20   Wt 185 lb 12 oz  (84.3 kg)   SpO2 98%   BMI 35.10 kg/m   BP Readings from Last 3 Encounters:  07/10/16 (!) 142/80  03/06/16 140/78  01/10/16 140/90    Wt Readings from Last 3 Encounters:  07/10/16 185 lb 12 oz (84.3 kg)  03/06/16 187 lb (84.8 kg)  01/10/16 186 lb (84.4 kg)    Physical Exam  Constitutional: She appears well-developed. No distress.  HENT:  Head: Normocephalic.  Right Ear: External ear normal.  Left Ear: External ear normal.  Nose: Nose normal.  Mouth/Throat: Oropharynx is clear and moist.  Eyes: Conjunctivae are normal. Pupils are equal, round, and reactive to light. Right eye exhibits no discharge. Left eye exhibits no discharge.  Neck: Normal range of motion. Neck supple. No JVD present. No tracheal deviation present. No thyromegaly present.  Cardiovascular: Normal rate and normal heart sounds.   Pulmonary/Chest: No stridor. No respiratory distress. She has no wheezes.  Abdominal: Soft. Bowel sounds are normal. She exhibits no distension and no mass. There is no tenderness. There is no rebound and no guarding.  Musculoskeletal: She exhibits tenderness. She exhibits no edema.  Lymphadenopathy:    She has no cervical adenopathy.  Neurological: She displays normal reflexes. No cranial nerve deficit. She exhibits normal muscle tone. Coordination abnormal.  Skin: No rash noted. No erythema.  Psychiatric: She has a normal mood and affect.  Her behavior is normal. Judgment and thought content normal.  cane irreg HR  Lab Results  Component Value Date   WBC 5.6 03/06/2016   HGB 15.3 (H) 03/06/2016   HCT 44.5 03/06/2016   PLT 223.0 03/06/2016   GLUCOSE 105 (H) 03/06/2016   CHOL 175 11/27/2014   TRIG 103.0 11/27/2014   HDL 50.40 11/27/2014   LDLDIRECT 139.3 02/21/2008   LDLCALC 104 (H) 11/27/2014   ALT 18 03/29/2015   AST 17 03/29/2015   NA 141 03/06/2016   K 4.3 03/06/2016   CL 104 03/06/2016   CREATININE 0.59 03/06/2016   BUN 10 03/06/2016   CO2 31 03/06/2016   TSH  1.99 11/05/2015   HGBA1C 5.5 11/05/2015   MICROALBUR 0.4 06/25/2008    Mm Digital Screening Bilateral  Result Date: 08/13/2015 CLINICAL DATA:  Screening. EXAM: DIGITAL SCREENING BILATERAL MAMMOGRAM WITH CAD COMPARISON:  Previous exam(s). ACR Breast Density Category b: There are scattered areas of fibroglandular density. FINDINGS: There are no findings suspicious for malignancy. Images were processed with CAD. IMPRESSION: No mammographic evidence of malignancy. A result letter of this screening mammogram will be mailed directly to the patient. RECOMMENDATION: Screening mammogram in one year. (Code:SM-B-01Y) BI-RADS CATEGORY  1: Negative. Electronically Signed   By: Altamese Cabal M.D.   On: 08/17/2015 13:50    Assessment & Plan:   There are no diagnoses linked to this encounter. I am having Ms. Burek maintain her Ascorbic Acid (VITAMIN C PO), Coenzyme Q10 (CO Q 10 PO), (FLAXSEED, LINSEED, PO), Cod Liver Oil, Multiple Minerals (MULTI-MINERALS PO), vitamin C, bumetanide, aspirin, Zinc Sulfate (ZINC 15 PO), and telmisartan.  No orders of the defined types were placed in this encounter.    Follow-up: No Follow-up on file.  Walker Kehr, MD

## 2016-07-10 NOTE — Assessment & Plan Note (Addendum)
Wt Readings from Last 3 Encounters:  07/10/16 185 lb 12 oz (84.3 kg)  03/06/16 187 lb (84.8 kg)  01/10/16 186 lb (84.4 kg)   Wt loss - goal 150 lbs

## 2016-07-10 NOTE — Progress Notes (Signed)
Pre visit review using our clinic review tool, if applicable. No additional management support is needed unless otherwise documented below in the visit note. 

## 2016-07-10 NOTE — Assessment & Plan Note (Signed)
Labs

## 2016-07-10 NOTE — Assessment & Plan Note (Signed)
CBC

## 2016-07-10 NOTE — Assessment & Plan Note (Signed)
Pt wants to cont with ASA  Per Card: CHADS2-VASc= 4. She would benefit from long-term anticoagulation. She has already been placed on Eliquis. She is less than 83 years old, her creatinine is less than 1.5 and her weight is greater than 60 kg. She should be on 5 mg twice a day. I will arrange a 30 day event monitor to assess for arrhythmia burden. If she has frequent recurrent atrial flutter with RVR, we may need to consider antiarrhythmic drug therapy. Given her age, I would probably opt for amiodarone initially. Atrial flutter appears to be atypical. I am not certain that this would be a rhythm amenable to ablation.

## 2016-07-24 ENCOUNTER — Other Ambulatory Visit: Payer: Self-pay | Admitting: Internal Medicine

## 2016-07-24 DIAGNOSIS — Z1231 Encounter for screening mammogram for malignant neoplasm of breast: Secondary | ICD-10-CM

## 2016-08-14 ENCOUNTER — Ambulatory Visit: Payer: Medicare Other

## 2016-08-31 ENCOUNTER — Ambulatory Visit: Payer: Medicare Other

## 2016-09-14 ENCOUNTER — Ambulatory Visit
Admission: RE | Admit: 2016-09-14 | Discharge: 2016-09-14 | Disposition: A | Discharge status: Home / Self Care | Payer: Medicare Other | Source: Ambulatory Visit | Attending: Internal Medicine | Admitting: Internal Medicine

## 2016-09-14 DIAGNOSIS — Z1231 Encounter for screening mammogram for malignant neoplasm of breast: Secondary | ICD-10-CM | POA: Diagnosis not present

## 2016-09-22 ENCOUNTER — Telehealth: Payer: Self-pay | Admitting: Internal Medicine

## 2016-09-22 NOTE — Telephone Encounter (Signed)
Even though pt has a refill on telmisartan (MICARDIS) 40 MG tablet  The pharmacy will not refill it without having this medication verified.  [facility] on [address]  [phone]

## 2016-09-22 NOTE — Telephone Encounter (Signed)
A letter has been sent out Rx was Approved for one year starting today.  They have the Pts last name mispelt as Diegrel please look out.

## 2016-09-22 NOTE — Telephone Encounter (Signed)
Rec'd PA for Telmisartan completed on cover-my-meds w/ Key: [id number] waiting on approval status...Kathleen Mejia

## 2016-09-22 NOTE — Telephone Encounter (Signed)
Notified [facility] med has been approved.../lmb  Outcome Approvedtoday Effective from 09/22/2016 through 09/22/2017.

## 2016-09-22 NOTE — Telephone Encounter (Signed)
[facility] pharmacy is saying they need prior authorization for this med ---can you please start this process, thanks

## 2016-10-06 ENCOUNTER — Other Ambulatory Visit (INDEPENDENT_AMBULATORY_CARE_PROVIDER_SITE_OTHER): Payer: Medicare Other

## 2016-10-06 ENCOUNTER — Ambulatory Visit (INDEPENDENT_AMBULATORY_CARE_PROVIDER_SITE_OTHER): Payer: Medicare Other | Admitting: Internal Medicine

## 2016-10-06 ENCOUNTER — Encounter: Payer: Self-pay | Admitting: Internal Medicine

## 2016-10-06 DIAGNOSIS — Z6835 Body mass index (BMI) 35.0-35.9, adult: Secondary | ICD-10-CM

## 2016-10-06 DIAGNOSIS — D582 Other hemoglobinopathies: Secondary | ICD-10-CM

## 2016-10-06 DIAGNOSIS — I10 Essential (primary) hypertension: Secondary | ICD-10-CM | POA: Diagnosis not present

## 2016-10-06 LAB — BASIC METABOLIC PANEL
BUN: 12 mg/dL (ref 6–23)
CHLORIDE: 103 meq/L (ref 96–112)
CO2: 30 mEq/L (ref 19–32)
Calcium: 9.7 mg/dL (ref 8.4–10.5)
Creatinine, Ser: 0.62 mg/dL (ref 0.40–1.20)
GFR: 98.12 mL/min (ref >60.00)
Glucose, Bld: 105 mg/dL — ABNORMAL HIGH (ref 70–99)
POTASSIUM: 4 meq/L (ref 3.5–5.1)
Sodium: 140 mEq/L (ref 135–145)

## 2016-10-06 LAB — CBC WITH DIFFERENTIAL/PLATELET
BASOS PCT: 0.5 % (ref 0.0–3.0)
Basophils Absolute: 0 10*3/uL (ref 0.0–0.1)
EOS ABS: 0.2 10*3/uL (ref 0.0–0.7)
Eosinophils Relative: 2.7 % (ref 0.0–5.0)
HCT: 45.8 % (ref 36.0–46.0)
Hemoglobin: 15.7 g/dL — ABNORMAL HIGH (ref 12.0–15.0)
LYMPHS ABS: 1.8 10*3/uL (ref 0.7–4.0)
Lymphocytes Relative: 28 % (ref 12.0–46.0)
MCHC: 34.2 g/dL (ref 30.0–36.0)
MCV: 94.9 fl (ref 78.0–100.0)
MONO ABS: 0.6 10*3/uL (ref 0.1–1.0)
Monocytes Relative: 9.1 % (ref 3.0–12.0)
NEUTROS ABS: 3.9 10*3/uL (ref 1.4–7.7)
NEUTROS PCT: 59.7 % (ref 43.0–77.0)
PLATELETS: 215 10*3/uL (ref 150.0–400.0)
RBC: 4.82 Mil/uL (ref 3.87–5.11)
RDW: 12.7 % (ref 11.5–15.5)
WBC: 6.5 10*3/uL (ref 4.0–10.5)

## 2016-10-06 NOTE — Assessment & Plan Note (Signed)
Bumex, Micardis

## 2016-10-06 NOTE — Assessment & Plan Note (Signed)
ASA Pt declined a sleep test

## 2016-10-06 NOTE — Assessment & Plan Note (Signed)
Wt loss - lost 2 lbs

## 2016-10-06 NOTE — Progress Notes (Signed)
Subjective:  Patient ID: Kathleen Mejia, female    DOB: 04-22-1934  Age: 83 y.o. MRN: [id number]  CC: No chief complaint on file.   HPI Kathleen Mejia presents for HTN, OA, dyslipidemia f/u  Outpatient Medications Prior to Visit  Medication Sig Dispense Refill  . Ascorbic Acid (VITAMIN C PO) Take 1 tablet by mouth daily.    [name] aspirin 81 MG tablet Take 81 mg by mouth daily.    . bumetanide (BUMEX) 0.5 MG tablet TAKE 1 TABLET (0.5 MG TOTAL) BY MOUTH DAILY. 90 tablet 3  . Cod Liver Oil 1000 MG CAPS Take 1 capsule by mouth 2 (two) times daily.    . Coenzyme Q10 (CO Q 10 PO) Take 1 capsule by mouth daily.    [name] FLAXSEED, LINSEED, PO Take by mouth daily. PATIENT SPRINKLE FLAX SEEDS ONTO HER CEREAL DAILY    . Multiple Minerals (MULTI-MINERALS PO) Take 1 tablet by mouth daily.    [name] telmisartan (MICARDIS) 40 MG tablet TAKE 1 TABLET BY MOUTH EVERY DAY 90 tablet 1  . vitamin C (ASCORBIC ACID) 500 MG tablet Take 500 mg by mouth daily.    . Zinc Sulfate (ZINC 15 PO) Take by mouth.     No facility-administered medications prior to visit.     ROS Review of Systems  Constitutional: Negative for activity change, appetite change, chills, fatigue and unexpected weight change.  HENT: Negative for congestion, mouth sores and sinus pressure.   Eyes: Negative for visual disturbance.  Respiratory: Negative for cough and chest tightness.   Gastrointestinal: Negative for abdominal pain and nausea.  Genitourinary: Negative for difficulty urinating, frequency and vaginal pain.  Musculoskeletal: Positive for back pain and gait problem.  Skin: Negative for pallor and rash.  Neurological: Negative for dizziness, tremors, weakness, numbness and headaches.  Psychiatric/Behavioral: Negative for confusion, sleep disturbance and suicidal ideas.    Objective:  BP 132/78 (BP Location: Left Arm, Patient Position: Sitting, Cuff Size: Large)   Pulse 76   Temp 98.2 F (36.8 C) (Oral)   Ht 5\' 1"  (1.549 m)   Wt 183  lb 1.9 oz (83.1 kg)   SpO2 99%   BMI 34.60 kg/m   BP Readings from Last 3 Encounters:  10/06/16 132/78  07/10/16 (!) 142/80  03/06/16 140/78    Wt Readings from Last 3 Encounters:  10/06/16 183 lb 1.9 oz (83.1 kg)  07/10/16 185 lb 12 oz (84.3 kg)  03/06/16 187 lb (84.8 kg)    Physical Exam  Constitutional: She appears well-developed. No distress.  HENT:  Head: Normocephalic.  Right Ear: External ear normal.  Left Ear: External ear normal.  Nose: Nose normal.  Mouth/Throat: Oropharynx is clear and moist.  Eyes: Conjunctivae are normal. Pupils are equal, round, and reactive to light. Right eye exhibits no discharge. Left eye exhibits no discharge.  Neck: Normal range of motion. Neck supple. No JVD present. No tracheal deviation present. No thyromegaly present.  Cardiovascular: Normal rate, regular rhythm and normal heart sounds.   Pulmonary/Chest: No stridor. No respiratory distress. She has no wheezes.  Abdominal: Soft. Bowel sounds are normal. She exhibits no distension and no mass. There is no tenderness. There is no rebound and no guarding.  Musculoskeletal: She exhibits no edema or tenderness.  Lymphadenopathy:    She has no cervical adenopathy.  Neurological: She displays normal reflexes. No cranial nerve deficit. She exhibits normal muscle tone. Coordination normal.  Skin: No rash noted. No erythema.  Psychiatric: She has  a normal mood and affect. Her behavior is normal. Judgment and thought content normal.    Lab Results  Component Value Date   WBC 5.7 07/10/2016   HGB 16.0 (H) 07/10/2016   HCT 47.0 (H) 07/10/2016   PLT 196.0 07/10/2016   GLUCOSE 104 (H) 07/10/2016   CHOL 175 11/27/2014   TRIG 103.0 11/27/2014   HDL 50.40 11/27/2014   LDLDIRECT 139.3 02/21/2008   LDLCALC 104 (H) 11/27/2014   ALT 21 07/10/2016   AST 17 07/10/2016   NA 142 07/10/2016   K 4.2 07/10/2016   CL 104 07/10/2016   CREATININE 0.63 07/10/2016   BUN 18 07/10/2016   CO2 29 07/10/2016    TSH 1.99 11/05/2015   HGBA1C 5.5 07/10/2016   MICROALBUR 0.4 06/25/2008    Mm Digital Screening Bilateral  Result Date: 09/14/2016 CLINICAL DATA:  Screening. EXAM: DIGITAL SCREENING BILATERAL MAMMOGRAM WITH CAD COMPARISON:  Previous exam(s). ACR Breast Density Category b: There are scattered areas of fibroglandular density. FINDINGS: There are no findings suspicious for malignancy. Images were processed with CAD. IMPRESSION: No mammographic evidence of malignancy. A result letter of this screening mammogram will be mailed directly to the patient. RECOMMENDATION: Screening mammogram in one year. (Code:SM-B-01Y) BI-RADS CATEGORY  1: Negative. Electronically Signed   By: Everlean Alstrom M.D.   On: 09/14/2016 17:05    Assessment & Plan:   There are no diagnoses linked to this encounter. I am having Kathleen Mejia maintain her Ascorbic Acid (VITAMIN C PO), Coenzyme Q10 (CO Q 10 PO), (FLAXSEED, LINSEED, PO), Cod Liver Oil, Multiple Minerals (MULTI-MINERALS PO), vitamin C, bumetanide, aspirin, Zinc Sulfate (ZINC 15 PO), and telmisartan.  No orders of the defined types were placed in this encounter.    Follow-up: No Follow-up on file.  Walker Kehr, MD

## 2016-10-06 NOTE — Progress Notes (Signed)
Pre visit review using our clinic review tool, if applicable. No additional management support is needed unless otherwise documented below in the visit note. 

## 2016-10-06 NOTE — Patient Instructions (Signed)
Well w/Jill 

## 2016-11-15 ENCOUNTER — Other Ambulatory Visit: Payer: Self-pay | Admitting: Internal Medicine

## 2017-02-01 ENCOUNTER — Ambulatory Visit (INDEPENDENT_AMBULATORY_CARE_PROVIDER_SITE_OTHER): Payer: Medicare Other | Admitting: Internal Medicine

## 2017-02-01 ENCOUNTER — Encounter: Payer: Self-pay | Admitting: Internal Medicine

## 2017-02-01 ENCOUNTER — Other Ambulatory Visit (INDEPENDENT_AMBULATORY_CARE_PROVIDER_SITE_OTHER): Payer: Medicare Other

## 2017-02-01 VITALS — BP 144/90 | HR 98 | Temp 98.8°F | Ht 61.0 in | Wt 186.0 lb

## 2017-02-01 DIAGNOSIS — F419 Anxiety disorder, unspecified: Secondary | ICD-10-CM

## 2017-02-01 DIAGNOSIS — I10 Essential (primary) hypertension: Secondary | ICD-10-CM

## 2017-02-01 DIAGNOSIS — R7309 Other abnormal glucose: Secondary | ICD-10-CM

## 2017-02-01 LAB — BASIC METABOLIC PANEL
BUN: 13 mg/dL (ref 6–23)
CHLORIDE: 101 meq/L (ref 96–112)
CO2: 31 meq/L (ref 19–32)
CREATININE: 0.61 mg/dL (ref 0.40–1.20)
Calcium: 10.3 mg/dL (ref 8.4–10.5)
GFR: 99.9 mL/min (ref >60.00)
GLUCOSE: 95 mg/dL (ref 70–99)
Potassium: 4.1 mEq/L (ref 3.5–5.1)
Sodium: 139 mEq/L (ref 135–145)

## 2017-02-01 MED ORDER — LOSARTAN POTASSIUM 50 MG PO TABS: 50.0000 mg | ORAL_TABLET | Freq: Every day | ORAL | 3 refills | Status: DC

## 2017-02-01 NOTE — Assessment & Plan Note (Signed)
Appears to be situational today, declines need for other tx, cont to follow

## 2017-02-01 NOTE — Assessment & Plan Note (Signed)
Asympt, for f/u glc with labs

## 2017-02-01 NOTE — Progress Notes (Signed)
Subjective:    Patient ID: Kathleen Mejia, female    DOB: 05/19/1934, 83 y.o.   MRN: [id number]  HPI  Here to f/u, very nervous, after recent med change to telmisartan 40 mg, and she has been reading up on it and is wary of K and renal fxn change, She is frustrated and upset that she had several different manufacturers generic pills given her last time at the same time with no package insert per pt.  Also with symtpoms she thinks may be related to side effect - HA, frequent urination, staggery sometimes but Pt denies chest pain, increased sob or doe, wheezing, orthopnea, PND, increased LE swelling, palpitations, dizziness or syncope.   Pt denies new neurological symptoms such as new facial or extremity weakness or numbness   Pt denies fever, wt loss, night sweats, loss of appetite, or other constitutional symptoms Past Medical History:  Diagnosis Date  . Dislocated shoulder 2012   right  . Fatty liver   . Gallstone    1.5 cm  . HTN (hypertension)   . Hyperlipidemia   . LBP (low back pain)   . Osteoarthritis   . Rosacea   . Shortness of breath    with activities and exertion   Past Surgical History:  Procedure Laterality Date  . CHOLECYSTECTOMY N/A 03/27/2014   Procedure: LAPAROSCOPIC CHOLECYSTECTOMY WITH INTRAOPERATIVE CHOLANGIOGRAM;  Surgeon: Fanny Skates, MD;  Location: [facility];  Service: General;  Laterality: N/A;  . TONSILLECTOMY      reports that she has never smoked. She has never used smokeless tobacco. She reports that she does not drink alcohol or use drugs. family history includes Breast cancer in her mother; Hypertension in her mother; Pancreatic cancer in her mother. Allergies  Allergen Reactions  . Eliquis [Apixaban]     HAs, achy  . Maxzide [Hydrochlorothiazide W-Triamterene]     Cramps w/a high dose  . Metoprolol     achy   Current Outpatient Prescriptions on File Prior to Visit  Medication Sig Dispense Refill  . Ascorbic Acid (VITAMIN C PO) Take 1 tablet by  mouth daily.    [name] aspirin 81 MG tablet Take 81 mg by mouth daily.    . bumetanide (BUMEX) 0.5 MG tablet TAKE 1 TABLET (0.5 MG TOTAL) BY MOUTH DAILY. 90 tablet 3  . Cod Liver Oil 1000 MG CAPS Take 1 capsule by mouth 2 (two) times daily.    . Coenzyme Q10 (CO Q 10 PO) Take 1 capsule by mouth daily.    [name] FLAXSEED, LINSEED, PO Take by mouth daily. PATIENT SPRINKLE FLAX SEEDS ONTO HER CEREAL DAILY    . Multiple Minerals (MULTI-MINERALS PO) Take 1 tablet by mouth daily.    . vitamin C (ASCORBIC ACID) 500 MG tablet Take 500 mg by mouth daily.    . Zinc Sulfate (ZINC 15 PO) Take by mouth.     No current facility-administered medications on file prior to visit.    Review of Systems  Constitutional: Negative for other unusual diaphoresis or sweats HENT: Negative for ear discharge or swelling Eyes: Negative for other worsening visual disturbances Respiratory: Negative for stridor or other swelling  Gastrointestinal: Negative for worsening distension or other blood Genitourinary: Negative for retention or other urinary change Musculoskeletal: Negative for other MSK pain or swelling Skin: Negative for color change or other new lesions Neurological: Negative for worsening tremors and other numbness  Psychiatric/Behavioral: Negative for worsening agitation or other fatigue All other system neg per pt  Objective:   Physical Exam BP (!) 144/90   Pulse 98   Temp 98.8 F (37.1 C) (Oral)   Ht 5\' 1"  (1.549 m)   Wt 186 lb (84.4 kg)   SpO2 98%   BMI 35.14 kg/m  VS noted, not ill appearing Constitutional: Pt appears in NAD HENT: Head: NCAT.  Right Ear: External ear normal.  Left Ear: External ear normal.  Eyes: . Pupils are equal, round, and reactive to light. Conjunctivae and EOM are normal Nose: without d/c or deformity Neck: Neck supple. Gross normal ROM Cardiovascular: Normal rate and regular rhythm.   Pulmonary/Chest: Effort normal and breath sounds without rales or wheezing.  Abd:   Soft, NT, ND, + BS, no organomegaly Neurological: Pt is alert. At baseline orientation, motor grossly intact Skin: Skin is warm. No rashes, other new lesions, no LE edema Psychiatric: Pt behavior is normal without agitation , 2-3+ nervous No other exan findings  Lab Results  Component Value Date   WBC 6.5 10/06/2016   HGB 15.7 (H) 10/06/2016   HCT 45.8 10/06/2016   PLT 215.0 10/06/2016   GLUCOSE 105 (H) 10/06/2016   CHOL 175 11/27/2014   TRIG 103.0 11/27/2014   HDL 50.40 11/27/2014   LDLDIRECT 139.3 02/21/2008   LDLCALC 104 (H) 11/27/2014   ALT 21 07/10/2016   AST 17 07/10/2016   NA 140 10/06/2016   K 4.0 10/06/2016   CL 103 10/06/2016   CREATININE 0.62 10/06/2016   BUN 12 10/06/2016   CO2 30 10/06/2016   TSH 1.99 11/05/2015   HGBA1C 5.5 07/10/2016   MICROALBUR 0.4 06/25/2008   Transthoracic Echocardiography 02/05/2014 - summary Study Conclusions - Left ventricle: The cavity size was normal. Systolic function was normal. The estimated ejection fraction was in the range of 55% to 60%. Wall motion was normal; there were no regional wall motion abnormalities. There was an increased relative contribution of atrial contraction to ventricular filling. Doppler parameters are consistent with abnormal left ventricular relaxation (grade 1 diastolic dysfunction). - Aortic valve: Trileaflet; normal thickness, mildly calcified leaflets. There was mild regurgitation. - Mitral valve: Mild calcification. - Atrial septum: There was increased thickness of the septum, consistent with lipomatous hypertrophy. - Tricuspid valve: There was trivial regurgitation. - Pulmonic valve: There was trivial regurgitation.       Assessment & Plan:

## 2017-02-01 NOTE — Patient Instructions (Addendum)
Ok to stop the telmisartan  Please take all new medication as prescribed - losartan 50 mg per day  Please continue all other medications as before, and refills have been done if requested.  Please have the pharmacy call with any other refills you may need.  Please keep your appointments with your specialists as you may have planned  Please go to the LAB in the Basement (turn left off the elevator) for the tests to be done today  You will be contacted by phone if any changes need to be made immediately.  Otherwise, you will receive a letter about your results with an explanation, but please check with MyChart first.  Please remember to sign up for MyChart if you have not done so, as this will be important to you in the future with finding out test results, communicating by private email, and scheduling acute appointments online when needed.  If you have Medicare related insurance (such as traditional Medicare, Blue [employer] or [employer], or similar), Please make an appointment at the [facility] with Sharee Pimple, the [employer], for your Wellness Visit in this office, which is a benefit with your insurance.  Please make appt with Dr Darrell Jewel in 2-3 wks for follow up

## 2017-02-01 NOTE — Assessment & Plan Note (Addendum)
Pt is very nervous and attributes multiple somatic symtpoms and even sleep with her BP med, and is somewhat confused about micardis being the same as telmisartan, and is quite put off by numerous side effects listed such as blurred vision and headache that she is convinced is caused by her telmisartan, though I have tried to educate her about real world possible side effects that bear little resemblence to the listed side effects meant to protect the company legally, and required by government  Finally, I am able to ask her to change the telmisartan to losartan 50 mg daily, and f/u BP at home and next visit with PCP.  Per request at least 4 times, we will check her BMP today

## 2017-02-19 ENCOUNTER — Ambulatory Visit: Payer: Medicare Other | Admitting: Internal Medicine

## 2017-04-09 ENCOUNTER — Encounter: Payer: Self-pay | Admitting: Internal Medicine

## 2017-04-09 ENCOUNTER — Ambulatory Visit: Payer: Medicare Other | Admitting: Internal Medicine

## 2017-04-09 DIAGNOSIS — F419 Anxiety disorder, unspecified: Secondary | ICD-10-CM | POA: Diagnosis not present

## 2017-04-09 DIAGNOSIS — Z6835 Body mass index (BMI) 35.0-35.9, adult: Secondary | ICD-10-CM

## 2017-04-09 DIAGNOSIS — I10 Essential (primary) hypertension: Secondary | ICD-10-CM

## 2017-04-09 DIAGNOSIS — I480 Paroxysmal atrial fibrillation: Secondary | ICD-10-CM | POA: Diagnosis not present

## 2017-04-09 MED ORDER — ASPIRIN 81 MG PO TABS: 81.0000 mg | ORAL_TABLET | Freq: Every day | ORAL | 11 refills | Status: DC

## 2017-04-09 MED ORDER — BUMETANIDE 0.5 MG PO TABS: ORAL_TABLET | ORAL | 3 refills | Status: DC

## 2017-04-09 MED ORDER — VITAMIN D 1000 UNITS PO TABS: 1000.0000 [IU] | ORAL_TABLET | Freq: Every day | ORAL | 11 refills | Status: AC

## 2017-04-09 NOTE — Assessment & Plan Note (Addendum)
Wt Readings from Last 3 Encounters:  04/09/17 188 lb (85.3 kg)  02/01/17 186 lb (84.4 kg)  10/06/16 183 lb 1.9 oz (83.1 kg)  discussed wt loss - pt declined Dr Migdalia Dk ref

## 2017-04-09 NOTE — Assessment & Plan Note (Signed)
Cont w/Bumex - re-start

## 2017-04-09 NOTE — Assessment & Plan Note (Signed)
Discussed.

## 2017-04-09 NOTE — Assessment & Plan Note (Signed)
In NSR 

## 2017-04-09 NOTE — Progress Notes (Signed)
Subjective:  Patient ID: Kathleen Mejia, female    DOB: [birth year]  Age: 83 y.o. MRN: [id number]  CC: No chief complaint on file.   HPI Kathleen Mejia presents for HTN The pt got very nervous about  Valsartan recall - had multiple side effects w/Losartan - stopped it. She did well w/Micardis but it was too $$$. Not taking Bumex x 1 week. C/o OA   Outpatient Medications Prior to Visit  Medication Sig Dispense Refill  . aspirin 81 MG tablet Take 81 mg by mouth daily.    . bumetanide (BUMEX) 0.5 MG tablet TAKE 1 TABLET (0.5 MG TOTAL) BY MOUTH DAILY. 90 tablet 3  . Cod Liver Oil 1000 MG CAPS Take 1 capsule by mouth 2 (two) times daily.    . Coenzyme Q10 (CO Q 10 PO) Take 1 capsule by mouth daily.    [name] FLAXSEED, LINSEED, PO Take by mouth daily. PATIENT SPRINKLE FLAX SEEDS ONTO HER CEREAL DAILY    . Multiple Minerals (MULTI-MINERALS PO) Take 1 tablet by mouth daily.    . vitamin C (ASCORBIC ACID) 500 MG tablet Take 500 mg by mouth daily.    . Zinc Sulfate (ZINC 15 PO) Take by mouth.    . Ascorbic Acid (VITAMIN C PO) Take 1 tablet by mouth daily.    [name] losartan (COZAAR) 50 MG tablet Take 1 tablet (50 mg total) by mouth daily. 90 tablet 3   No facility-administered medications prior to visit.     ROS Review of Systems  Constitutional: Positive for fatigue and unexpected weight change. Negative for activity change, appetite change and chills.  HENT: Negative for congestion, mouth sores and sinus pressure.   Eyes: Negative for visual disturbance.  Respiratory: Negative for cough and chest tightness.   Gastrointestinal: Negative for abdominal pain and nausea.  Genitourinary: Negative for difficulty urinating, frequency and vaginal pain.  Musculoskeletal: Negative for back pain and gait problem.  Skin: Negative for pallor and rash.  Neurological: Negative for dizziness, tremors, weakness, numbness and headaches.  Psychiatric/Behavioral: Negative for confusion and sleep disturbance. The  patient is nervous/anxious.     Objective:  BP 134/82 (BP Location: Left Arm, Patient Position: Sitting, Cuff Size: Large)   Pulse 86   Temp 98.1 F (36.7 C) (Oral)   Ht 5\' 1"  (1.549 m)   Wt 188 lb (85.3 kg)   SpO2 98%   BMI 35.52 kg/m   BP Readings from Last 3 Encounters:  04/09/17 134/82  02/01/17 (!) 144/90  10/06/16 132/78    Wt Readings from Last 3 Encounters:  04/09/17 188 lb (85.3 kg)  02/01/17 186 lb (84.4 kg)  10/06/16 183 lb 1.9 oz (83.1 kg)    Physical Exam  Constitutional: She appears well-developed. No distress.  HENT:  Head: Normocephalic.  Right Ear: External ear normal.  Left Ear: External ear normal.  Nose: Nose normal.  Mouth/Throat: Oropharynx is clear and moist.  Eyes: Conjunctivae are normal. Pupils are equal, round, and reactive to light. Right eye exhibits no discharge. Left eye exhibits no discharge.  Neck: Normal range of motion. Neck supple. No JVD present. No tracheal deviation present. No thyromegaly present.  Cardiovascular: Normal rate, regular rhythm and normal heart sounds.  Pulmonary/Chest: No stridor. No respiratory distress. She has no wheezes.  Abdominal: Soft. Bowel sounds are normal. She exhibits no distension and no mass. There is no tenderness. There is no rebound and no guarding.  Musculoskeletal: She exhibits no edema or tenderness.  Lymphadenopathy:  She has no cervical adenopathy.  Neurological: She displays normal reflexes. No cranial nerve deficit. She exhibits normal muscle tone. Coordination normal.  Skin: No rash noted. No erythema.  Psychiatric: She has a normal mood and affect. Her behavior is normal. Judgment and thought content normal.  Obese Cane  Lab Results  Component Value Date   WBC 6.5 10/06/2016   HGB 15.7 (H) 10/06/2016   HCT 45.8 10/06/2016   PLT 215.0 10/06/2016   GLUCOSE 95 02/01/2017   CHOL 175 11/27/2014   TRIG 103.0 11/27/2014   HDL 50.40 11/27/2014   LDLDIRECT 139.3 02/21/2008   LDLCALC  104 (H) 11/27/2014   ALT 21 07/10/2016   AST 17 07/10/2016   NA 139 02/01/2017   K 4.1 02/01/2017   CL 101 02/01/2017   CREATININE 0.61 02/01/2017   BUN 13 02/01/2017   CO2 31 02/01/2017   TSH 1.99 11/05/2015   HGBA1C 5.5 07/10/2016   MICROALBUR 0.4 06/25/2008    Mm Digital Screening Bilateral  Result Date: 09/14/2016 CLINICAL DATA:  Screening. EXAM: DIGITAL SCREENING BILATERAL MAMMOGRAM WITH CAD COMPARISON:  Previous exam(s). ACR Breast Density Category b: There are scattered areas of fibroglandular density. FINDINGS: There are no findings suspicious for malignancy. Images were processed with CAD. IMPRESSION: No mammographic evidence of malignancy. A result letter of this screening mammogram will be mailed directly to the patient. RECOMMENDATION: Screening mammogram in one year. (Code:SM-B-01Y) BI-RADS CATEGORY  1: Negative. Electronically Signed   By: Everlean Alstrom M.D.   On: 09/14/2016 17:05    Assessment & Plan:   There are no diagnoses linked to this encounter. I have discontinued Kathleen Mejia's Ascorbic Acid (VITAMIN C PO) and losartan. I am also having her maintain her Coenzyme Q10 (CO Q 10 PO), (FLAXSEED, LINSEED, PO), Cod Liver Oil, Multiple Minerals (MULTI-MINERALS PO), vitamin C, bumetanide, aspirin, and Zinc Sulfate (ZINC 15 PO).  No orders of the defined types were placed in this encounter.    Follow-up: No Follow-up on file.  Walker Kehr, MD

## 2017-04-10 ENCOUNTER — Telehealth: Payer: Self-pay | Admitting: Internal Medicine

## 2017-04-10 MED ORDER — BUMETANIDE 0.5 MG PO TABS: ORAL_TABLET | ORAL | 3 refills | Status: DC

## 2017-04-10 NOTE — Telephone Encounter (Signed)
Resent

## 2017-04-10 NOTE — Telephone Encounter (Signed)
Pt called and the pharmacy never received her bumetanide (BUMEX) 0.5 MG tablet Please resend, same pharmacy

## 2017-04-18 DIAGNOSIS — H5213 Myopia, bilateral: Secondary | ICD-10-CM | POA: Diagnosis not present

## 2017-07-10 ENCOUNTER — Ambulatory Visit: Payer: Medicare Other | Admitting: Internal Medicine

## 2017-07-10 DIAGNOSIS — H2513 Age-related nuclear cataract, bilateral: Secondary | ICD-10-CM | POA: Diagnosis not present

## 2017-07-10 DIAGNOSIS — H02839 Dermatochalasis of unspecified eye, unspecified eyelid: Secondary | ICD-10-CM | POA: Diagnosis not present

## 2017-07-10 DIAGNOSIS — H25043 Posterior subcapsular polar age-related cataract, bilateral: Secondary | ICD-10-CM | POA: Diagnosis not present

## 2017-07-10 DIAGNOSIS — H25013 Cortical age-related cataract, bilateral: Secondary | ICD-10-CM | POA: Diagnosis not present

## 2017-07-31 ENCOUNTER — Ambulatory Visit: Payer: Medicare Other | Admitting: Internal Medicine

## 2017-07-31 ENCOUNTER — Encounter: Payer: Self-pay | Admitting: Internal Medicine

## 2017-07-31 ENCOUNTER — Other Ambulatory Visit (INDEPENDENT_AMBULATORY_CARE_PROVIDER_SITE_OTHER): Payer: Medicare Other

## 2017-07-31 VITALS — BP 132/86 | HR 107 | Temp 97.9°F | Ht 61.0 in | Wt 184.0 lb

## 2017-07-31 DIAGNOSIS — H409 Unspecified glaucoma: Secondary | ICD-10-CM | POA: Insufficient documentation

## 2017-07-31 DIAGNOSIS — R202 Paresthesia of skin: Secondary | ICD-10-CM | POA: Diagnosis not present

## 2017-07-31 DIAGNOSIS — H9193 Unspecified hearing loss, bilateral: Secondary | ICD-10-CM | POA: Diagnosis not present

## 2017-07-31 DIAGNOSIS — R269 Unspecified abnormalities of gait and mobility: Secondary | ICD-10-CM

## 2017-07-31 DIAGNOSIS — R5383 Other fatigue: Secondary | ICD-10-CM

## 2017-07-31 DIAGNOSIS — H9190 Unspecified hearing loss, unspecified ear: Secondary | ICD-10-CM | POA: Insufficient documentation

## 2017-07-31 LAB — BASIC METABOLIC PANEL
BUN: 13 mg/dL (ref 6–23)
CO2: 33 mEq/L — ABNORMAL HIGH (ref 19–32)
Calcium: 10.2 mg/dL (ref 8.4–10.5)
Chloride: 101 mEq/L (ref 96–112)
Creatinine, Ser: 0.61 mg/dL (ref 0.40–1.20)
GFR: 99.78 mL/min (ref >60.00)
Glucose, Bld: 95 mg/dL (ref 70–99)
Potassium: 4.2 mEq/L (ref 3.5–5.1)
SODIUM: 140 meq/L (ref 135–145)

## 2017-07-31 LAB — CBC WITH DIFFERENTIAL/PLATELET
BASOS ABS: 0.1 10*3/uL (ref 0.0–0.1)
Basophils Relative: 1.1 % (ref 0.0–3.0)
Eosinophils Absolute: 0.2 10*3/uL (ref 0.0–0.7)
Eosinophils Relative: 2.9 % (ref 0.0–5.0)
HEMATOCRIT: 42.9 % (ref 36.0–46.0)
Hemoglobin: 14.8 g/dL (ref 12.0–15.0)
LYMPHS PCT: 26.9 % (ref 12.0–46.0)
Lymphs Abs: 2.1 10*3/uL (ref 0.7–4.0)
MCHC: 34.5 g/dL (ref 30.0–36.0)
MCV: 93.9 fl (ref 78.0–100.0)
MONOS PCT: 7.3 % (ref 3.0–12.0)
Monocytes Absolute: 0.6 10*3/uL (ref 0.1–1.0)
NEUTROS PCT: 61.8 % (ref 43.0–77.0)
Neutro Abs: 4.9 10*3/uL (ref 1.4–7.7)
Platelets: 255 10*3/uL (ref 150.0–400.0)
RBC: 4.57 Mil/uL (ref 3.87–5.11)
RDW: 12.6 % (ref 11.5–15.5)
WBC: 7.9 10*3/uL (ref 4.0–10.5)

## 2017-07-31 LAB — HEPATIC FUNCTION PANEL
ALT: 20 U/L (ref 0–35)
AST: 19 U/L (ref 0–37)
Albumin: 3.8 g/dL (ref 3.5–5.2)
Alkaline Phosphatase: 80 U/L (ref 39–117)
BILIRUBIN TOTAL: 0.4 mg/dL (ref 0.2–1.2)
Bilirubin, Direct: 0 mg/dL (ref 0.0–0.3)
Total Protein: 7.6 g/dL (ref 6.0–8.3)

## 2017-07-31 LAB — TSH: TSH: 1.77 u[IU]/mL (ref 0.35–4.50)

## 2017-07-31 LAB — SEDIMENTATION RATE: SED RATE: 9 mm/h (ref 0–30)

## 2017-07-31 LAB — VITAMIN B12: Vitamin B-12: 296 pg/mL (ref 211–911)

## 2017-07-31 MED ORDER — AZITHROMYCIN 250 MG PO TABS
ORAL_TABLET | ORAL | 0 refills | Status: DC
Start: 2017-07-31 — End: 2017-11-07

## 2017-07-31 NOTE — Assessment & Plan Note (Signed)
Labs

## 2017-07-31 NOTE — Progress Notes (Signed)
Subjective:  Patient ID: Kathleen Mejia, female    DOB: 22-Apr-1934  Age: 84 y.o. MRN: [id number]  CC: No chief complaint on file.   HPI Cydni Reddoch Coburn presents for poor hearing, cataracts and a new glaucoma - MRI pending. I'll need to order labs...  C/o R>L earache  Outpatient Medications Prior to Visit  Medication Sig Dispense Refill  . aspirin 81 MG tablet Take 1 tablet (81 mg total) daily by mouth. 30 tablet 11  . bumetanide (BUMEX) 0.5 MG tablet TAKE 1 TABLET (0.5 MG TOTAL) BY MOUTH DAILY. 90 tablet 3  . cholecalciferol (VITAMIN D) 1000 units tablet Take 1 tablet (1,000 Units total) daily by mouth. 30 tablet 11  . Cod Liver Oil 1000 MG CAPS Take 1 capsule by mouth 2 (two) times daily.    . Coenzyme Q10 (CO Q 10 PO) Take 1 capsule by mouth daily.    [name] FLAXSEED, LINSEED, PO Take by mouth daily. PATIENT SPRINKLE FLAX SEEDS ONTO HER CEREAL DAILY    . Multiple Minerals (MULTI-MINERALS PO) Take 1 tablet by mouth daily.    . vitamin C (ASCORBIC ACID) 500 MG tablet Take 500 mg by mouth daily.    . Zinc Sulfate (ZINC 15 PO) Take by mouth.     No facility-administered medications prior to visit.     ROS Review of Systems  Constitutional: Positive for fatigue. Negative for activity change, appetite change, chills and unexpected weight change.  HENT: Positive for hearing loss. Negative for congestion, mouth sores and sinus pressure.   Eyes: Positive for visual disturbance.  Respiratory: Negative for cough and chest tightness.   Gastrointestinal: Negative for abdominal pain and nausea.  Genitourinary: Negative for difficulty urinating, frequency and vaginal pain.  Musculoskeletal: Positive for arthralgias and gait problem. Negative for back pain.  Skin: Negative for pallor and rash.  Neurological: Negative for dizziness, tremors, weakness, numbness and headaches.  Psychiatric/Behavioral: Negative for confusion and sleep disturbance. The patient is nervous/anxious.     Objective:  BP  132/86 (BP Location: Left Arm, Patient Position: Sitting, Cuff Size: Large)   Pulse (!) 107   Temp 97.9 F (36.6 C) (Oral)   Ht 5\' 1"  (1.549 m)   Wt 184 lb (83.5 kg)   SpO2 98%   BMI 34.77 kg/m   BP Readings from Last 3 Encounters:  07/31/17 132/86  04/09/17 134/82  02/01/17 (!) 144/90    Wt Readings from Last 3 Encounters:  07/31/17 184 lb (83.5 kg)  04/09/17 188 lb (85.3 kg)  02/01/17 186 lb (84.4 kg)    Physical Exam  Constitutional: She appears well-developed. No distress.  HENT:  Head: Normocephalic.  Right Ear: External ear normal.  Left Ear: External ear normal.  Nose: Nose normal.  Mouth/Throat: Oropharynx is clear and moist.  Eyes: Conjunctivae are normal. Pupils are equal, round, and reactive to light. Right eye exhibits no discharge. Left eye exhibits no discharge.  Neck: Normal range of motion. Neck supple. No JVD present. No tracheal deviation present. No thyromegaly present.  Cardiovascular: Normal rate, regular rhythm and normal heart sounds.  Pulmonary/Chest: No stridor. No respiratory distress. She has no wheezes.  Abdominal: Soft. Bowel sounds are normal. She exhibits no distension and no mass. There is no tenderness. There is no rebound and no guarding.  Musculoskeletal: She exhibits no edema or tenderness.  Lymphadenopathy:    She has no cervical adenopathy.  Neurological: She displays normal reflexes. No cranial nerve deficit. She exhibits normal muscle tone.  Coordination abnormal.  Skin: No rash noted. No erythema.  Psychiatric: She has a normal mood and affect. Her behavior is normal. Judgment and thought content normal.  hard hearing Cane Obese L TM red   Lab Results  Component Value Date   WBC 6.5 10/06/2016   HGB 15.7 (H) 10/06/2016   HCT 45.8 10/06/2016   PLT 215.0 10/06/2016   GLUCOSE 95 02/01/2017   CHOL 175 11/27/2014   TRIG 103.0 11/27/2014   HDL 50.40 11/27/2014   LDLDIRECT 139.3 02/21/2008   LDLCALC 104 (H) 11/27/2014   ALT  21 07/10/2016   AST 17 07/10/2016   NA 139 02/01/2017   K 4.1 02/01/2017   CL 101 02/01/2017   CREATININE 0.61 02/01/2017   BUN 13 02/01/2017   CO2 31 02/01/2017   TSH 1.99 11/05/2015   HGBA1C 5.5 07/10/2016   MICROALBUR 0.4 06/25/2008    Mm Digital Screening Bilateral  Result Date: 09/14/2016 CLINICAL DATA:  Screening. EXAM: DIGITAL SCREENING BILATERAL MAMMOGRAM WITH CAD COMPARISON:  Previous exam(s). ACR Breast Density Category b: There are scattered areas of fibroglandular density. FINDINGS: There are no findings suspicious for malignancy. Images were processed with CAD. IMPRESSION: No mammographic evidence of malignancy. A result letter of this screening mammogram will be mailed directly to the patient. RECOMMENDATION: Screening mammogram in one year. (Code:SM-B-01Y) BI-RADS CATEGORY  1: Negative. Electronically Signed   By: Everlean Alstrom M.D.   On: 09/14/2016 17:05    Assessment & Plan:   There are no diagnoses linked to this encounter. I am having Alvie Heidelberg. Mcginn maintain her Coenzyme Q10 (CO Q 10 PO), (FLAXSEED, LINSEED, PO), Cod Liver Oil, Multiple Minerals (MULTI-MINERALS PO), vitamin C, Zinc Sulfate (ZINC 15 PO), aspirin, cholecalciferol, and bumetanide.  No orders of the defined types were placed in this encounter.    Follow-up: No Follow-up on file.  Walker Kehr, MD

## 2017-07-31 NOTE — Assessment & Plan Note (Signed)
probable spinal stenosis Home PT/OT Neurol ref discussed

## 2017-07-31 NOTE — Assessment & Plan Note (Signed)
Dr Talbert Forest [facility] Brain MRI

## 2017-07-31 NOTE — Assessment & Plan Note (Addendum)
Audiol ref - [name] will let me know where to send a referral  [place]

## 2017-08-03 ENCOUNTER — Telehealth: Payer: Self-pay | Admitting: Internal Medicine

## 2017-08-03 DIAGNOSIS — E669 Obesity, unspecified: Secondary | ICD-10-CM | POA: Diagnosis not present

## 2017-08-03 DIAGNOSIS — H409 Unspecified glaucoma: Secondary | ICD-10-CM | POA: Diagnosis not present

## 2017-08-03 DIAGNOSIS — I4891 Unspecified atrial fibrillation: Secondary | ICD-10-CM | POA: Diagnosis not present

## 2017-08-03 DIAGNOSIS — M6281 Muscle weakness (generalized): Secondary | ICD-10-CM | POA: Diagnosis not present

## 2017-08-03 DIAGNOSIS — Z7982 Long term (current) use of aspirin: Secondary | ICD-10-CM | POA: Diagnosis not present

## 2017-08-03 DIAGNOSIS — I872 Venous insufficiency (chronic) (peripheral): Secondary | ICD-10-CM | POA: Diagnosis not present

## 2017-08-03 DIAGNOSIS — K760 Fatty (change of) liver, not elsewhere classified: Secondary | ICD-10-CM | POA: Diagnosis not present

## 2017-08-03 DIAGNOSIS — F419 Anxiety disorder, unspecified: Secondary | ICD-10-CM | POA: Diagnosis not present

## 2017-08-03 DIAGNOSIS — E559 Vitamin D deficiency, unspecified: Secondary | ICD-10-CM | POA: Diagnosis not present

## 2017-08-03 DIAGNOSIS — I10 Essential (primary) hypertension: Secondary | ICD-10-CM | POA: Diagnosis not present

## 2017-08-03 DIAGNOSIS — H9190 Unspecified hearing loss, unspecified ear: Secondary | ICD-10-CM | POA: Diagnosis not present

## 2017-08-03 DIAGNOSIS — Z9181 History of falling: Secondary | ICD-10-CM | POA: Diagnosis not present

## 2017-08-03 DIAGNOSIS — M1711 Unilateral primary osteoarthritis, right knee: Secondary | ICD-10-CM | POA: Diagnosis not present

## 2017-08-03 NOTE — Telephone Encounter (Signed)
Copied from [place] [id number]. Topic: Inquiry >> Aug 03, 2017 10:40 AM Pricilla Handler wrote: Reason for CRM: Tatiyana with [facility] called requesting [place] for PT - 1 time for 1 week, then Twice a week for 2 additional weeks. She has also requested an OT evaluation and Skilled Nursing.

## 2017-08-04 NOTE — Telephone Encounter (Signed)
Ok Thx 

## 2017-08-06 ENCOUNTER — Telehealth: Payer: Self-pay | Admitting: Internal Medicine

## 2017-08-06 NOTE — Telephone Encounter (Signed)
Called Titiyana no answer LMOM w/MD response.Marland KitchenJohny Chess

## 2017-08-06 NOTE — Telephone Encounter (Signed)
Copied from [place]. Topic: Quick Communication - Lab Results >> Aug 02, 2017  1:18 PM Karren Cobble, [place] wrote: Called patient to inform them of 07/31/17 lab results. When patient returns call, triage nurse may disclose results. >> Aug 06, 2017  2:50 PM Scherrie Gerlach wrote: Jacqlyn Larsen with Dr [facility] office states [facility] was going to get these lab results to Dr Talbert Forest, but they have not received,  Fax  [phone]  Phone: [phone]  Press Option 1 and ask for Becky  Pt is having cateract surgery, but they are trying to rule out certain things to get a MRI approved

## 2017-08-07 ENCOUNTER — Other Ambulatory Visit: Payer: Self-pay | Admitting: Internal Medicine

## 2017-08-07 DIAGNOSIS — Z1231 Encounter for screening mammogram for malignant neoplasm of breast: Secondary | ICD-10-CM

## 2017-08-07 NOTE — Telephone Encounter (Signed)
All labs faxed

## 2017-08-23 DIAGNOSIS — R269 Unspecified abnormalities of gait and mobility: Secondary | ICD-10-CM | POA: Diagnosis not present

## 2017-08-27 DIAGNOSIS — H409 Unspecified glaucoma: Secondary | ICD-10-CM | POA: Diagnosis not present

## 2017-08-27 DIAGNOSIS — F419 Anxiety disorder, unspecified: Secondary | ICD-10-CM | POA: Diagnosis not present

## 2017-08-27 DIAGNOSIS — I10 Essential (primary) hypertension: Secondary | ICD-10-CM | POA: Diagnosis not present

## 2017-08-27 DIAGNOSIS — I4891 Unspecified atrial fibrillation: Secondary | ICD-10-CM | POA: Diagnosis not present

## 2017-08-27 DIAGNOSIS — I872 Venous insufficiency (chronic) (peripheral): Secondary | ICD-10-CM | POA: Diagnosis not present

## 2017-08-27 DIAGNOSIS — K760 Fatty (change of) liver, not elsewhere classified: Secondary | ICD-10-CM | POA: Diagnosis not present

## 2017-08-27 DIAGNOSIS — M1711 Unilateral primary osteoarthritis, right knee: Secondary | ICD-10-CM | POA: Diagnosis not present

## 2017-08-27 DIAGNOSIS — M6281 Muscle weakness (generalized): Secondary | ICD-10-CM | POA: Diagnosis not present

## 2017-08-27 DIAGNOSIS — H9190 Unspecified hearing loss, unspecified ear: Secondary | ICD-10-CM | POA: Diagnosis not present

## 2017-08-27 DIAGNOSIS — Z9181 History of falling: Secondary | ICD-10-CM

## 2017-08-27 DIAGNOSIS — E669 Obesity, unspecified: Secondary | ICD-10-CM | POA: Diagnosis not present

## 2017-08-27 DIAGNOSIS — E559 Vitamin D deficiency, unspecified: Secondary | ICD-10-CM | POA: Diagnosis not present

## 2017-08-27 DIAGNOSIS — Z7982 Long term (current) use of aspirin: Secondary | ICD-10-CM | POA: Diagnosis not present

## 2017-09-17 ENCOUNTER — Ambulatory Visit
Admission: RE | Admit: 2017-09-17 | Discharge: 2017-09-17 | Disposition: A | Discharge status: Home / Self Care | Payer: Medicare Other | Source: Ambulatory Visit | Attending: Internal Medicine | Admitting: Internal Medicine

## 2017-09-17 DIAGNOSIS — Z1231 Encounter for screening mammogram for malignant neoplasm of breast: Secondary | ICD-10-CM

## 2017-09-18 DIAGNOSIS — H6521 Chronic serous otitis media, right ear: Secondary | ICD-10-CM | POA: Diagnosis not present

## 2017-09-18 DIAGNOSIS — H906 Mixed conductive and sensorineural hearing loss, bilateral: Secondary | ICD-10-CM | POA: Diagnosis not present

## 2017-10-30 ENCOUNTER — Ambulatory Visit: Payer: Medicare Other | Admitting: Internal Medicine

## 2017-11-07 ENCOUNTER — Encounter: Payer: Self-pay | Admitting: Internal Medicine

## 2017-11-07 ENCOUNTER — Ambulatory Visit: Payer: Medicare Other | Admitting: Internal Medicine

## 2017-11-07 DIAGNOSIS — R609 Edema, unspecified: Secondary | ICD-10-CM

## 2017-11-07 DIAGNOSIS — Z6835 Body mass index (BMI) 35.0-35.9, adult: Secondary | ICD-10-CM | POA: Diagnosis not present

## 2017-11-07 DIAGNOSIS — I872 Venous insufficiency (chronic) (peripheral): Secondary | ICD-10-CM | POA: Diagnosis not present

## 2017-11-07 DIAGNOSIS — R21 Rash and other nonspecific skin eruption: Secondary | ICD-10-CM | POA: Diagnosis not present

## 2017-11-07 MED ORDER — POTASSIUM CHLORIDE ER 10 MEQ PO TBCR: 10.0000 meq | EXTENDED_RELEASE_TABLET | Freq: Every day | ORAL | 3 refills | Status: DC | PRN

## 2017-11-07 MED ORDER — CLOTRIMAZOLE-BETAMETHASONE 1-0.05 % EX CREA: 1.0000 "application " | TOPICAL_CREAM | Freq: Two times a day (BID) | CUTANEOUS | 1 refills | Status: DC

## 2017-11-07 MED ORDER — FUROSEMIDE 40 MG PO TABS: 40.0000 mg | ORAL_TABLET | Freq: Every day | ORAL | 3 refills | Status: DC | PRN

## 2017-11-07 NOTE — Assessment & Plan Note (Signed)
B venous Doppler

## 2017-11-07 NOTE — Assessment & Plan Note (Signed)
Wt Readings from Last 3 Encounters:  11/07/17 181 lb (82.1 kg)  07/31/17 184 lb (83.5 kg)  04/09/17 188 lb (85.3 kg)

## 2017-11-07 NOTE — Progress Notes (Signed)
Subjective:  Patient ID: Kathleen Mejia, female    DOB: [birth year]  Age: 84 y.o. MRN: [id number]  CC: No chief complaint on file.   HPI Kathleen Mejia presents for HTN, LE edema, OA f/u C/o rash on B LEs on Wed - no itching  Outpatient Medications Prior to Visit  Medication Sig Dispense Refill  . aspirin 81 MG tablet Take 1 tablet (81 mg total) daily by mouth. 30 tablet 11  . bumetanide (BUMEX) 0.5 MG tablet TAKE 1 TABLET (0.5 MG TOTAL) BY MOUTH DAILY. 90 tablet 3  . cholecalciferol (VITAMIN D) 1000 units tablet Take 1 tablet (1,000 Units total) daily by mouth. 30 tablet 11  . Cod Liver Oil 1000 MG CAPS Take 1 capsule by mouth 2 (two) times daily.    . Coenzyme Q10 (CO Q 10 PO) Take 1 capsule by mouth daily.    [name] FLAXSEED, LINSEED, PO Take by mouth daily. PATIENT SPRINKLE FLAX SEEDS ONTO HER CEREAL DAILY    . Multiple Minerals (MULTI-MINERALS PO) Take 1 tablet by mouth daily.    . vitamin C (ASCORBIC ACID) 500 MG tablet Take 500 mg by mouth daily.    . Zinc Sulfate (ZINC 15 PO) Take by mouth.    [name] azithromycin (ZITHROMAX Z-PAK) 250 MG tablet As directed 6 each 0   No facility-administered medications prior to visit.     ROS: Review of Systems  Constitutional: Negative for activity change, appetite change, chills, fatigue and unexpected weight change.  HENT: Negative for congestion, mouth sores and sinus pressure.   Eyes: Negative for visual disturbance.  Respiratory: Negative for cough and chest tightness.   Gastrointestinal: Negative for abdominal pain and nausea.  Genitourinary: Negative for difficulty urinating, frequency and vaginal pain.  Musculoskeletal: Positive for arthralgias, back pain and gait problem.  Skin: Positive for rash. Negative for pallor.  Neurological: Negative for dizziness, tremors, weakness, numbness and headaches.  Psychiatric/Behavioral: Negative for confusion and sleep disturbance.    Objective:  BP 134/82 (BP Location: Left Arm, Patient Position:  Sitting, Cuff Size: Normal)   Pulse 69   Temp 97.8 F (36.6 C) (Oral)   Ht 5\' 1"  (1.549 m)   Wt 181 lb (82.1 kg)   SpO2 98%   BMI 34.20 kg/m   BP Readings from Last 3 Encounters:  11/07/17 134/82  07/31/17 132/86  04/09/17 134/82    Wt Readings from Last 3 Encounters:  11/07/17 181 lb (82.1 kg)  07/31/17 184 lb (83.5 kg)  04/09/17 188 lb (85.3 kg)    Physical Exam  Constitutional: She appears well-developed. No distress.  HENT:  Head: Normocephalic.  Right Ear: External ear normal.  Left Ear: External ear normal.  Nose: Nose normal.  Mouth/Throat: Oropharynx is clear and moist.  Eyes: Pupils are equal, round, and reactive to light. Conjunctivae are normal. Right eye exhibits no discharge. Left eye exhibits no discharge.  Neck: Normal range of motion. Neck supple. No JVD present. No tracheal deviation present. No thyromegaly present.  Cardiovascular: Normal rate, regular rhythm and normal heart sounds.  Pulmonary/Chest: No stridor. No respiratory distress. She has no wheezes.  Abdominal: Soft. Bowel sounds are normal. She exhibits no distension and no mass. There is no tenderness. There is no rebound and no guarding.  Musculoskeletal: She exhibits no edema or tenderness.  Lymphadenopathy:    She has no cervical adenopathy.  Neurological: She displays normal reflexes. No cranial nerve deficit. She exhibits normal muscle tone. Coordination normal.  Skin: No  rash noted. No erythema.  Psychiatric: She has a normal mood and affect. Her behavior is normal. Judgment and thought content normal.  pink ankles w/1+ edema Walker eryth papules w/darker edges on B shins L>R  Lab Results  Component Value Date   WBC 7.9 07/31/2017   HGB 14.8 07/31/2017   HCT 42.9 07/31/2017   PLT 255.0 07/31/2017   GLUCOSE 95 07/31/2017   CHOL 175 11/27/2014   TRIG 103.0 11/27/2014   HDL 50.40 11/27/2014   LDLDIRECT 139.3 02/21/2008   LDLCALC 104 (H) 11/27/2014   ALT 20 07/31/2017   AST 19  07/31/2017   NA 140 07/31/2017   K 4.2 07/31/2017   CL 101 07/31/2017   CREATININE 0.61 07/31/2017   BUN 13 07/31/2017   CO2 33 (H) 07/31/2017   TSH 1.77 07/31/2017   HGBA1C 5.5 07/10/2016   MICROALBUR 0.4 06/25/2008    Mm Digital Screening Bilateral  Result Date: 09/18/2017 CLINICAL DATA:  Screening. EXAM: DIGITAL SCREENING BILATERAL MAMMOGRAM WITH CAD COMPARISON:  Previous exam(s). ACR Breast Density Category b: There are scattered areas of fibroglandular density. FINDINGS: There are no findings suspicious for malignancy. Images were processed with CAD. IMPRESSION: No mammographic evidence of malignancy. A result letter of this screening mammogram will be mailed directly to the patient. RECOMMENDATION: Screening mammogram in one year. (Code:SM-B-01Y) BI-RADS CATEGORY  1: Negative. Electronically Signed   By: Margarette Canada M.D.   On: 09/18/2017 14:07    Assessment & Plan:   There are no diagnoses linked to this encounter.   No orders of the defined types were placed in this encounter.    Follow-up: No follow-ups on file.  Walker Kehr, MD

## 2017-11-07 NOTE — Assessment & Plan Note (Addendum)
LEs Lotrisone bid Keep open Treat edema Skin bx if not better

## 2017-11-07 NOTE — Assessment & Plan Note (Signed)
Furosemide.

## 2017-11-09 ENCOUNTER — Telehealth: Payer: Self-pay

## 2017-11-09 ENCOUNTER — Ambulatory Visit (HOSPITAL_COMMUNITY)
Admission: RE | Admit: 2017-11-09 | Discharge: 2017-11-09 | Disposition: A | Discharge status: Home / Self Care | Payer: Medicare Other | Source: Ambulatory Visit | Attending: Vascular Surgery | Admitting: Vascular Surgery

## 2017-11-09 DIAGNOSIS — R609 Edema, unspecified: Secondary | ICD-10-CM

## 2017-11-09 NOTE — Telephone Encounter (Signed)
Pt.notified

## 2017-11-09 NOTE — Telephone Encounter (Signed)
pls inform the pt - no DVT Thx

## 2017-11-09 NOTE — Telephone Encounter (Signed)
fyi

## 2017-11-09 NOTE — Telephone Encounter (Signed)
Kathleen Mejia from [facility] called to report LE Venous negative for DVT.

## 2017-11-30 ENCOUNTER — Ambulatory Visit: Payer: Medicare Other | Admitting: Internal Medicine

## 2017-11-30 ENCOUNTER — Encounter: Payer: Self-pay | Admitting: Internal Medicine

## 2017-11-30 DIAGNOSIS — R269 Unspecified abnormalities of gait and mobility: Secondary | ICD-10-CM | POA: Diagnosis not present

## 2017-11-30 DIAGNOSIS — E538 Deficiency of other specified B group vitamins: Secondary | ICD-10-CM

## 2017-11-30 DIAGNOSIS — R7309 Other abnormal glucose: Secondary | ICD-10-CM | POA: Diagnosis not present

## 2017-11-30 DIAGNOSIS — R609 Edema, unspecified: Secondary | ICD-10-CM | POA: Diagnosis not present

## 2017-11-30 MED ORDER — CYANOCOBALAMIN 1000 MCG/ML IJ SOLN
1000.0000 ug | Freq: Once | INTRAMUSCULAR | Status: AC
  Administered 2017-11-30: 1000 ug via INTRAMUSCULAR

## 2017-11-30 NOTE — Assessment & Plan Note (Signed)
Better - Bumex, Furosemide, KCl NAS diet

## 2017-11-30 NOTE — Progress Notes (Signed)
Subjective:  Patient ID: Kathleen Mejia, female    DOB: 10-24-1933  Age: 84 y.o. MRN: [id number]  CC: No chief complaint on file.   HPI [name] presents for edema, HTN and rash.  Edema is better. Unable to take po B12 - constipation  Outpatient Medications Prior to Visit  Medication Sig Dispense Refill  . aspirin 81 MG tablet Take 1 tablet (81 mg total) daily by mouth. 30 tablet 11  . bumetanide (BUMEX) 0.5 MG tablet TAKE 1 TABLET (0.5 MG TOTAL) BY MOUTH DAILY. 90 tablet 3  . cholecalciferol (VITAMIN D) 1000 units tablet Take 1 tablet (1,000 Units total) daily by mouth. 30 tablet 11  . clotrimazole-betamethasone (LOTRISONE) cream Apply 1 application topically 2 (two) times daily. 90 g 1  . Cod Liver Oil 1000 MG CAPS Take 1 capsule by mouth 2 (two) times daily.    . Coenzyme Q10 (CO Q 10 PO) Take 1 capsule by mouth daily.    [name] FLAXSEED, LINSEED, PO Take by mouth daily. PATIENT SPRINKLE FLAX SEEDS ONTO HER CEREAL DAILY    . furosemide (LASIX) 40 MG tablet Take 1 tablet (40 mg total) by mouth daily as needed for edema. 30 tablet 3  . Multiple Minerals (MULTI-MINERALS PO) Take 1 tablet by mouth daily.    . potassium chloride (K-DUR) 10 MEQ tablet Take 1 tablet (10 mEq total) by mouth daily as needed (take with furosemide). 30 tablet 3  . vitamin C (ASCORBIC ACID) 500 MG tablet Take 500 mg by mouth daily.    . Zinc Sulfate (ZINC 15 PO) Take by mouth.     No facility-administered medications prior to visit.     ROS: Review of Systems  Constitutional: Negative for activity change, appetite change, chills, fatigue and unexpected weight change.  HENT: Negative for congestion, mouth sores and sinus pressure.   Eyes: Negative for visual disturbance.  Respiratory: Negative for cough and chest tightness.   Cardiovascular: Positive for leg swelling.  Gastrointestinal: Negative for abdominal pain and nausea.  Genitourinary: Negative for difficulty urinating, frequency and vaginal pain.    Musculoskeletal: Positive for back pain and gait problem.  Skin: Positive for color change. Negative for pallor and rash.  Neurological: Negative for dizziness, tremors, weakness, numbness and headaches.  Psychiatric/Behavioral: Negative for confusion and sleep disturbance.    Objective:  BP 128/82 (BP Location: Left Arm, Patient Position: Sitting, Cuff Size: Large)   Pulse 99   Temp 98.9 F (37.2 C) (Oral)   Ht 5\' 1"  (1.549 m)   Wt 177 lb (80.3 kg)   SpO2 98%   BMI 33.44 kg/m   BP Readings from Last 3 Encounters:  11/30/17 128/82  11/07/17 134/82  07/31/17 132/86    Wt Readings from Last 3 Encounters:  11/30/17 177 lb (80.3 kg)  11/07/17 181 lb (82.1 kg)  07/31/17 184 lb (83.5 kg)    Physical Exam  Constitutional: She appears well-developed. No distress.  HENT:  Head: Normocephalic.  Right Ear: External ear normal.  Left Ear: External ear normal.  Nose: Nose normal.  Mouth/Throat: Oropharynx is clear and moist.  Eyes: Pupils are equal, round, and reactive to light. Conjunctivae are normal. Right eye exhibits no discharge. Left eye exhibits no discharge.  Neck: Normal range of motion. Neck supple. No JVD present. No tracheal deviation present. No thyromegaly present.  Cardiovascular: Normal rate, regular rhythm and normal heart sounds.  Pulmonary/Chest: No stridor. No respiratory distress. She has no wheezes.  Abdominal: Soft.  Bowel sounds are normal. She exhibits no distension and no mass. There is no tenderness. There is no rebound and no guarding.  Musculoskeletal: She exhibits edema and tenderness.  Lymphadenopathy:    She has no cervical adenopathy.  Neurological: She displays normal reflexes. No cranial nerve deficit. She exhibits normal muscle tone. Coordination abnormal.  Skin: No rash noted. No erythema.  Psychiatric: She has a normal mood and affect. Her behavior is normal. Judgment and thought content normal.  cane LLE 1+ edema RLE trace edema  Lab  Results  Component Value Date   WBC 7.9 07/31/2017   HGB 14.8 07/31/2017   HCT 42.9 07/31/2017   PLT 255.0 07/31/2017   GLUCOSE 95 07/31/2017   CHOL 175 11/27/2014   TRIG 103.0 11/27/2014   HDL 50.40 11/27/2014   LDLDIRECT 139.3 02/21/2008   LDLCALC 104 (H) 11/27/2014   ALT 20 07/31/2017   AST 19 07/31/2017   NA 140 07/31/2017   K 4.2 07/31/2017   CL 101 07/31/2017   CREATININE 0.61 07/31/2017   BUN 13 07/31/2017   CO2 33 (H) 07/31/2017   TSH 1.77 07/31/2017   HGBA1C 5.5 07/10/2016   MICROALBUR 0.4 06/25/2008    No results found.  Assessment & Plan:   There are no diagnoses linked to this encounter.   No orders of the defined types were placed in this encounter.    Follow-up: No follow-ups on file.  Walker Kehr, MD

## 2017-11-30 NOTE — Patient Instructions (Addendum)
Compression support hose - knee high on in am and off at night    Chronic Venous Insufficiency Chronic venous insufficiency, also called venous stasis, is a condition that prevents blood from being pumped effectively through the veins in your legs. Blood may no longer be pumped effectively from the legs back to the heart. This condition can range from mild to severe. With proper treatment, you should be able to continue with an active life. What are the causes? Chronic venous insufficiency occurs when the vein walls become stretched, weakened, or damaged, or when valves within the vein are damaged. Some common causes of this include:  High blood pressure inside the veins (venous hypertension).  Increased blood pressure in the leg veins from long periods of sitting or standing.  A blood clot that blocks blood flow in a vein (deep vein thrombosis, DVT).  Inflammation of a vein (phlebitis) that causes a blood clot to form.  Tumors in the pelvis that cause blood to back up.  What increases the risk? The following factors may make you more likely to develop this condition:  Having a family history of this condition.  Obesity.  Pregnancy.  Living without enough physical activity or exercise (sedentary lifestyle).  Smoking.  Having a job that requires long periods of standing or sitting in one place.  Being a certain age. Women in their 60s and 42s and men in their 26s are more likely to develop this condition.  What are the signs or symptoms? Symptoms of this condition include:  Veins that are enlarged, bulging, or twisted (varicose veins).  Skin breakdown or ulcers.  Reddened or discolored skin on the front of the leg.  Brown, smooth, tight, and painful skin just above the ankle, usually on the inside of the leg (lipodermatosclerosis).  Swelling.  How is this diagnosed? This condition may be diagnosed based on:  Your medical history.  A physical exam.  Tests, such  as: ? A procedure that creates an image of a blood vessel and nearby organs and provides information about blood flow through the blood vessel (duplex ultrasound). ? A procedure that tests blood flow (plethysmography). ? A procedure to look at the veins using X-ray and dye (venogram).  How is this treated? The goals of treatment are to help you return to an active life and to minimize pain or disability. Treatment depends on the severity of your condition, and it may include:  Wearing compression stockings. These can help relieve symptoms and help prevent your condition from getting worse. However, they do not cure the condition.  Sclerotherapy. This is a procedure involving an injection of a material that "dissolves" damaged veins.  Surgery. This may involve: ? Removing a diseased vein (vein stripping). ? Cutting off blood flow through the vein (laser ablation surgery). ? Repairing a valve.  Follow these instructions at home:  Wear compression stockings as told by your health care provider. These stockings help to prevent blood clots and reduce swelling in your legs.  Take over-the-counter and prescription medicines only as told by your health care provider.  Stay active by exercising, walking, or doing different activities. Ask your health care provider what activities are safe for you and how much exercise you need.  Drink enough fluid to keep your urine clear or pale yellow.  Do not use any products that contain nicotine or tobacco, such as cigarettes and e-cigarettes. If you need help quitting, ask your health care provider.  Keep all follow-up visits as told  by your health care provider. This is important. Contact a health care provider if:  You have redness, swelling, or more pain in the affected area.  You see a red streak or line that extends up or down from the affected area.  You have skin breakdown or a loss of skin in the affected area, even if the breakdown is  small.  You get an injury in the affected area. Get help right away if:  You get an injury and an open wound in the affected area.  You have severe pain that does not get better with medicine.  You have sudden numbness or weakness in the foot or ankle below the affected area, or you have trouble moving your foot or ankle.  You have a fever and you have worse or persistent symptoms.  You have chest pain.  You have shortness of breath. Summary  Chronic venous insufficiency, also called venous stasis, is a condition that prevents blood from being pumped effectively through the veins in your legs.  Chronic venous insufficiency occurs when the vein walls become stretched, weakened, or damaged, or when valves within the vein are damaged.  Treatment for this condition depends on how severe your condition is, and it may involve wearing compression stockings or having a procedure.  Make sure you stay active by exercising, walking, or doing different activities. Ask your health care provider what activities are safe for you and how much exercise you need. This information is not intended to replace advice given to you by your health care provider. Make sure you discuss any questions you have with your health care provider. Document Released: 09/25/2006 Document Revised: 04/10/2016 Document Reviewed: 04/10/2016 Elsevier Interactive Patient Education  2017 [place]  DASH stands for "Dietary Approaches to Stop Hypertension." The DASH eating plan is a healthy eating plan that has been shown to reduce high blood pressure (hypertension). It may also reduce your risk for type 2 diabetes, heart disease, and stroke. The DASH eating plan may also help with weight loss. What are tips for following this plan? General guidelines  Avoid eating more than 2,300 mg (milligrams) of salt (sodium) a day. If you have hypertension, you may need to reduce your sodium intake to 1,500 mg  a day.  Limit alcohol intake to no more than 1 drink a day for nonpregnant women and 2 drinks a day for men. One drink equals 12 oz of beer, 5 oz of wine, or 1 oz of hard liquor.  Work with your health care provider to maintain a healthy body weight or to lose weight. Ask what an ideal weight is for you.  Get at least 30 minutes of exercise that causes your heart to beat faster (aerobic exercise) most days of the week. Activities may include walking, swimming, or biking.  Work with your health care provider or diet and nutrition specialist (dietitian) to adjust your eating plan to your individual calorie needs. Reading food labels  Check food labels for the amount of sodium per serving. Choose foods with less than 5 percent of the Daily Value of sodium. Generally, foods with less than 300 mg of sodium per serving fit into this eating plan.  To find whole grains, look for the word "whole" as the first word in the ingredient list. Shopping  Buy products labeled as "low-sodium" or "no salt added."  Buy fresh foods. Avoid canned foods and premade or frozen meals. Cooking  Avoid adding salt  when cooking. Use salt-free seasonings or herbs instead of table salt or sea salt. Check with your health care provider or pharmacist before using salt substitutes.  Do not fry foods. Cook foods using healthy methods such as baking, boiling, grilling, and broiling instead.  Cook with heart-healthy oils, such as olive, canola, soybean, or sunflower oil. Meal planning   Eat a balanced diet that includes: ? 5 or more servings of fruits and vegetables each day. At each meal, try to fill half of your plate with fruits and vegetables. ? Up to 6-8 servings of whole grains each day. ? Less than 6 oz of lean meat, poultry, or fish each day. A 3-oz serving of meat is about the same size as a deck of cards. One egg equals 1 oz. ? 2 servings of low-fat dairy each day. ? A serving of nuts, seeds, or beans 5 times  each week. ? Heart-healthy fats. Healthy fats called Omega-3 fatty acids are found in foods such as flaxseeds and coldwater fish, like sardines, salmon, and mackerel.  Limit how much you eat of the following: ? Canned or prepackaged foods. ? Food that is high in trans fat, such as fried foods. ? Food that is high in saturated fat, such as fatty meat. ? Sweets, desserts, sugary drinks, and other foods with added sugar. ? Full-fat dairy products.  Do not salt foods before eating.  Try to eat at least 2 vegetarian meals each week.  Eat more home-cooked food and less restaurant, buffet, and fast food.  When eating at a restaurant, ask that your food be prepared with less salt or no salt, if possible. What foods are recommended? The items listed may not be a complete list. Talk with your dietitian about what dietary choices are best for you. Grains Whole-grain or whole-wheat bread. Whole-grain or whole-wheat pasta. Brown rice. [place]. Bulgur. Whole-grain and low-sodium cereals. Pita bread. Low-fat, low-sodium crackers. Whole-wheat flour tortillas. Vegetables Fresh or frozen vegetables (raw, steamed, roasted, or grilled). Low-sodium or reduced-sodium tomato and vegetable juice. Low-sodium or reduced-sodium tomato sauce and tomato paste. Low-sodium or reduced-sodium canned vegetables. Fruits All fresh, dried, or frozen fruit. Canned fruit in natural juice (without added sugar). Meat and other protein foods Skinless chicken or [place]. Ground chicken or [place]. Pork with fat trimmed off. Fish and seafood. Egg whites. Dried beans, peas, or lentils. Unsalted nuts, nut butters, and seeds. Unsalted canned beans. Lean cuts of beef with fat trimmed off. Low-sodium, lean deli meat. Dairy Low-fat (1%) or fat-free (skim) milk. Fat-free, low-fat, or reduced-fat cheeses. Nonfat, low-sodium ricotta or cottage cheese. Low-fat or nonfat yogurt. Low-fat, low-sodium cheese. Fats and oils Soft margarine  without trans fats. Vegetable oil. Low-fat, reduced-fat, or light mayonnaise and salad dressings (reduced-sodium). Canola, safflower, olive, soybean, and sunflower oils. Avocado. Seasoning and other foods Herbs. Spices. Seasoning mixes without salt. Unsalted popcorn and pretzels. Fat-free sweets. What foods are not recommended? The items listed may not be a complete list. Talk with your dietitian about what dietary choices are best for you. Grains Baked goods made with fat, such as croissants, muffins, or some breads. Dry pasta or rice meal packs. Vegetables Creamed or fried vegetables. Vegetables in a cheese sauce. Regular canned vegetables (not low-sodium or reduced-sodium). Regular canned tomato sauce and paste (not low-sodium or reduced-sodium). Regular tomato and vegetable juice (not low-sodium or reduced-sodium). Angie Fava. Olives. Fruits Canned fruit in a light or heavy syrup. Fried fruit. Fruit in cream or butter sauce. Meat and other  protein foods Fatty cuts of meat. Ribs. Fried meat. Berniece Salines. Sausage. Bologna and other processed lunch meats. Salami. Fatback. Hotdogs. Bratwurst. Salted nuts and seeds. Canned beans with added salt. Canned or smoked fish. Whole eggs or egg yolks. Chicken or [place] with skin. Dairy Whole or 2% milk, cream, and half-and-half. Whole or full-fat cream cheese. Whole-fat or sweetened yogurt. Full-fat cheese. Nondairy creamers. Whipped toppings. Processed cheese and cheese spreads. Fats and oils Butter. Stick margarine. Lard. Shortening. Ghee. Bacon fat. Tropical oils, such as coconut, palm kernel, or palm oil. Seasoning and other foods Salted popcorn and pretzels. Onion salt, garlic salt, seasoned salt, table salt, and sea salt. Worcestershire sauce. Tartar sauce. Barbecue sauce. Teriyaki sauce. Soy sauce, including reduced-sodium. Steak sauce. Canned and packaged gravies. Fish sauce. Oyster sauce. Cocktail sauce. Horseradish that you find on the shelf. Ketchup.  Mustard. Meat flavorings and tenderizers. Bouillon cubes. Hot sauce and Tabasco sauce. Premade or packaged marinades. Premade or packaged taco seasonings. Relishes. Regular salad dressings. Where to find more information:  National Heart, Lung, and [place]: https://wilson-eaton.com/  American Heart Association: www.heart.org Summary  The DASH eating plan is a healthy eating plan that has been shown to reduce high blood pressure (hypertension). It may also reduce your risk for type 2 diabetes, heart disease, and stroke.  With the DASH eating plan, you should limit salt (sodium) intake to 2,300 mg a day. If you have hypertension, you may need to reduce your sodium intake to 1,500 mg a day.  When on the DASH eating plan, aim to eat more fresh fruits and vegetables, whole grains, lean proteins, low-fat dairy, and heart-healthy fats.  Work with your health care provider or diet and nutrition specialist (dietitian) to adjust your eating plan to your individual calorie needs. This information is not intended to replace advice given to you by your health care provider. Make sure you discuss any questions you have with your health care provider. Document Released: 05/11/2011 Document Revised: 05/15/2016 Document Reviewed: 05/15/2016 Elsevier Interactive Patient Education  [employer].

## 2017-11-30 NOTE — Assessment & Plan Note (Signed)
Better w/wt loss  

## 2017-11-30 NOTE — Assessment & Plan Note (Signed)
Cane

## 2017-11-30 NOTE — Assessment & Plan Note (Signed)
Borderline def Unable to tol PO B12 inj

## 2017-12-31 ENCOUNTER — Ambulatory Visit: Payer: Medicare Other

## 2018-01-01 ENCOUNTER — Ambulatory Visit (INDEPENDENT_AMBULATORY_CARE_PROVIDER_SITE_OTHER): Payer: Medicare Other | Admitting: *Deleted

## 2018-01-01 DIAGNOSIS — E538 Deficiency of other specified B group vitamins: Secondary | ICD-10-CM

## 2018-01-01 MED ORDER — CYANOCOBALAMIN 1000 MCG/ML IJ SOLN
1000.0000 ug | Freq: Once | INTRAMUSCULAR | Status: AC
  Administered 2018-01-01: 1000 ug via INTRAMUSCULAR

## 2018-01-18 ENCOUNTER — Ambulatory Visit: Payer: Medicare Other | Admitting: Internal Medicine

## 2018-01-18 ENCOUNTER — Encounter: Payer: Self-pay | Admitting: Internal Medicine

## 2018-01-18 VITALS — BP 128/76 | HR 102 | Temp 98.2°F | Ht 61.0 in | Wt 182.0 lb

## 2018-01-18 DIAGNOSIS — R609 Edema, unspecified: Secondary | ICD-10-CM | POA: Diagnosis not present

## 2018-01-18 DIAGNOSIS — I872 Venous insufficiency (chronic) (peripheral): Secondary | ICD-10-CM

## 2018-01-18 MED ORDER — TORSEMIDE 20 MG PO TABS
40.0000 mg | ORAL_TABLET | Freq: Every day | ORAL | 11 refills | Status: DC
Start: 2018-01-18 — End: 2018-11-13

## 2018-01-18 MED ORDER — BUMETANIDE 0.5 MG PO TABS: 0.5000 mg | ORAL_TABLET | Freq: Every day | ORAL | 3 refills | Status: DC

## 2018-01-18 MED ORDER — FUROSEMIDE 40 MG PO TABS: 80.0000 mg | ORAL_TABLET | Freq: Every day | ORAL | 3 refills | Status: DC | PRN

## 2018-01-18 NOTE — Progress Notes (Signed)
Subjective:  Patient ID: Kathleen Mejia, female    DOB: 07-22-1933  Age: 84 y.o. MRN: [id number]  CC: No chief complaint on file.   HPI Kathleen Mejia presents for leg swelling, pain in B legs and rash L>R Not better w/meds F/u HTN, LBP and OA  Outpatient Medications Prior to Visit  Medication Sig Dispense Refill  . aspirin 81 MG tablet Take 1 tablet (81 mg total) daily by mouth. 30 tablet 11  . bumetanide (BUMEX) 0.5 MG tablet TAKE 1 TABLET (0.5 MG TOTAL) BY MOUTH DAILY. 90 tablet 3  . cholecalciferol (VITAMIN D) 1000 units tablet Take 1 tablet (1,000 Units total) daily by mouth. 30 tablet 11  . clotrimazole-betamethasone (LOTRISONE) cream Apply 1 application topically 2 (two) times daily. 90 g 1  . Cod Liver Oil 1000 MG CAPS Take 1 capsule by mouth 2 (two) times daily.    . Coenzyme Q10 (CO Q 10 PO) Take 1 capsule by mouth daily.    [name] FLAXSEED, LINSEED, PO Take by mouth daily. PATIENT SPRINKLE FLAX SEEDS ONTO HER CEREAL DAILY    . furosemide (LASIX) 40 MG tablet Take 1 tablet (40 mg total) by mouth daily as needed for edema. 30 tablet 3  . Multiple Minerals (MULTI-MINERALS PO) Take 1 tablet by mouth daily.    . potassium chloride (K-DUR) 10 MEQ tablet Take 1 tablet (10 mEq total) by mouth daily as needed (take with furosemide). 30 tablet 3  . vitamin C (ASCORBIC ACID) 500 MG tablet Take 500 mg by mouth daily.    . Zinc Sulfate (ZINC 15 PO) Take by mouth.     No facility-administered medications prior to visit.     ROS: Review of Systems  Constitutional: Positive for fatigue. Negative for activity change, appetite change, chills and unexpected weight change.  HENT: Negative for congestion, mouth sores and sinus pressure.   Eyes: Negative for visual disturbance.  Respiratory: Negative for cough and chest tightness.   Cardiovascular: Positive for leg swelling.  Gastrointestinal: Negative for abdominal pain and nausea.  Genitourinary: Negative for difficulty urinating, frequency  and vaginal pain.  Musculoskeletal: Positive for arthralgias, back pain and gait problem.  Skin: Positive for color change and rash. Negative for pallor.  Neurological: Negative for dizziness, tremors, weakness, numbness and headaches.  Psychiatric/Behavioral: Negative for confusion and sleep disturbance.    Objective:  There were no vitals taken for this visit.  BP Readings from Last 3 Encounters:  11/30/17 128/82  11/07/17 134/82  07/31/17 132/86    Wt Readings from Last 3 Encounters:  11/30/17 177 lb (80.3 kg)  11/07/17 181 lb (82.1 kg)  07/31/17 184 lb (83.5 kg)    Physical Exam  Constitutional: She appears well-developed. No distress.  HENT:  Head: Normocephalic.  Right Ear: External ear normal.  Left Ear: External ear normal.  Nose: Nose normal.  Mouth/Throat: Oropharynx is clear and moist.  Eyes: Pupils are equal, round, and reactive to light. Conjunctivae are normal. Right eye exhibits no discharge. Left eye exhibits no discharge.  Neck: Normal range of motion. Neck supple. No JVD present. No tracheal deviation present. No thyromegaly present.  Cardiovascular: Normal rate, regular rhythm and normal heart sounds.  Pulmonary/Chest: No stridor. No respiratory distress. She has no wheezes.  Abdominal: Soft. Bowel sounds are normal. She exhibits no distension and no mass. There is no tenderness. There is no rebound and no guarding.  Musculoskeletal: She exhibits edema and tenderness.  Lymphadenopathy:    She has  no cervical adenopathy.  Neurological: She displays normal reflexes. No cranial nerve deficit. She exhibits normal muscle tone. Coordination abnormal.  Skin: Rash noted. No erythema.  Psychiatric: She has a normal mood and affect. Her behavior is normal. Judgment and thought content normal.  walker LLE 2+ RLE1+ Pale purple skin color B  Lab Results  Component Value Date   WBC 7.9 07/31/2017   HGB 14.8 07/31/2017   HCT 42.9 07/31/2017   PLT 255.0  07/31/2017   GLUCOSE 95 07/31/2017   CHOL 175 11/27/2014   TRIG 103.0 11/27/2014   HDL 50.40 11/27/2014   LDLDIRECT 139.3 02/21/2008   LDLCALC 104 (H) 11/27/2014   ALT 20 07/31/2017   AST 19 07/31/2017   NA 140 07/31/2017   K 4.2 07/31/2017   CL 101 07/31/2017   CREATININE 0.61 07/31/2017   BUN 13 07/31/2017   CO2 33 (H) 07/31/2017   TSH 1.77 07/31/2017   HGBA1C 5.5 07/10/2016   MICROALBUR 0.4 06/25/2008    No results found.  Assessment & Plan:   There are no diagnoses linked to this encounter.   No orders of the defined types were placed in this encounter.    Follow-up: No follow-ups on file.  Walker Kehr, MD

## 2018-01-28 ENCOUNTER — Other Ambulatory Visit: Payer: Self-pay

## 2018-01-28 DIAGNOSIS — I872 Venous insufficiency (chronic) (peripheral): Secondary | ICD-10-CM

## 2018-02-05 ENCOUNTER — Ambulatory Visit: Payer: Medicare Other

## 2018-02-05 ENCOUNTER — Ambulatory Visit (INDEPENDENT_AMBULATORY_CARE_PROVIDER_SITE_OTHER): Payer: Medicare Other | Admitting: *Deleted

## 2018-02-05 DIAGNOSIS — E538 Deficiency of other specified B group vitamins: Secondary | ICD-10-CM | POA: Diagnosis not present

## 2018-02-05 MED ORDER — CYANOCOBALAMIN 1000 MCG/ML IJ SOLN
1000.0000 ug | Freq: Once | INTRAMUSCULAR | Status: AC
  Administered 2018-02-05: 1000 ug via INTRAMUSCULAR

## 2018-02-18 ENCOUNTER — Other Ambulatory Visit (INDEPENDENT_AMBULATORY_CARE_PROVIDER_SITE_OTHER): Payer: Medicare Other

## 2018-02-18 ENCOUNTER — Encounter: Payer: Self-pay | Admitting: Internal Medicine

## 2018-02-18 ENCOUNTER — Ambulatory Visit: Payer: Medicare Other | Admitting: Internal Medicine

## 2018-02-18 DIAGNOSIS — R269 Unspecified abnormalities of gait and mobility: Secondary | ICD-10-CM

## 2018-02-18 DIAGNOSIS — R609 Edema, unspecified: Secondary | ICD-10-CM | POA: Diagnosis not present

## 2018-02-18 DIAGNOSIS — I872 Venous insufficiency (chronic) (peripheral): Secondary | ICD-10-CM

## 2018-02-18 LAB — URINALYSIS, ROUTINE W REFLEX MICROSCOPIC
Bilirubin Urine: NEGATIVE
HGB URINE DIPSTICK: NEGATIVE
Ketones, ur: NEGATIVE
Nitrite: POSITIVE — AB
Specific Gravity, Urine: <=1.005 — AB (ref 1.000–1.030)
TOTAL PROTEIN, URINE-UPE24: NEGATIVE
Urine Glucose: NEGATIVE
Urobilinogen, UA: 0.2 (ref 0.0–1.0)
pH: 6.5 (ref 5.0–8.0)

## 2018-02-18 LAB — BASIC METABOLIC PANEL
BUN: 13 mg/dL (ref 6–23)
CHLORIDE: 93 meq/L — AB (ref 96–112)
CO2: 42 mEq/L — ABNORMAL HIGH (ref 19–32)
CREATININE: 0.63 mg/dL (ref 0.40–1.20)
Calcium: 9.8 mg/dL (ref 8.4–10.5)
GFR: 96 mL/min (ref >60.00)
GLUCOSE: 121 mg/dL — AB (ref 70–99)
Potassium: 3.3 mEq/L — ABNORMAL LOW (ref 3.5–5.1)
Sodium: 139 mEq/L (ref 135–145)

## 2018-02-18 MED ORDER — TRIAMCINOLONE ACETONIDE 0.1 % EX OINT: 1.0000 "application " | TOPICAL_OINTMENT | Freq: Two times a day (BID) | CUTANEOUS | 3 refills | Status: DC | PRN

## 2018-02-18 NOTE — Progress Notes (Signed)
Subjective:  Patient ID: Kathleen Mejia, female    DOB: February 15, 1934  Age: 84 y.o. MRN: [id number]  CC: No chief complaint on file.   HPI Kathleen Mejia presents for venous insufficiency, leg swelling, HTN f/u Lost 3 lbs C/o urinary urgency  Outpatient Medications Prior to Visit  Medication Sig Dispense Refill  . aspirin 81 MG tablet Take 1 tablet (81 mg total) daily by mouth. 30 tablet 11  . bumetanide (BUMEX) 0.5 MG tablet Take 1 tablet (0.5 mg total) by mouth daily with breakfast. TAKE 1 TABLET (0.5 MG TOTAL) BY MOUTH DAILY in am. 90 tablet 3  . cholecalciferol (VITAMIN D) 1000 units tablet Take 1 tablet (1,000 Units total) daily by mouth. 30 tablet 11  . clotrimazole-betamethasone (LOTRISONE) cream Apply 1 application topically 2 (two) times daily. 90 g 1  . Cod Liver Oil 1000 MG CAPS Take 1 capsule by mouth 2 (two) times daily.    . Coenzyme Q10 (CO Q 10 PO) Take 1 capsule by mouth daily.    [name] FLAXSEED, LINSEED, PO Take by mouth daily. PATIENT SPRINKLE FLAX SEEDS ONTO HER CEREAL DAILY    . Multiple Minerals (MULTI-MINERALS PO) Take 1 tablet by mouth daily.    . potassium chloride (K-DUR) 10 MEQ tablet Take 1 tablet (10 mEq total) by mouth daily as needed (take with furosemide). 30 tablet 3  . torsemide (DEMADEX) 20 MG tablet Take 2 tablets (40 mg total) by mouth daily. 60 tablet 11  . vitamin C (ASCORBIC ACID) 500 MG tablet Take 500 mg by mouth daily.    . Zinc Sulfate (ZINC 15 PO) Take by mouth.     No facility-administered medications prior to visit.     ROS: Review of Systems  Constitutional: Positive for fatigue. Negative for activity change, appetite change, chills and unexpected weight change.  HENT: Negative for congestion, mouth sores and sinus pressure.   Eyes: Negative for visual disturbance.  Respiratory: Negative for cough and chest tightness.   Cardiovascular: Positive for leg swelling.  Gastrointestinal: Negative for abdominal pain and nausea.  Genitourinary:  Negative for difficulty urinating, frequency and vaginal pain.  Musculoskeletal: Positive for arthralgias, back pain and gait problem.  Skin: Negative for pallor and rash.  Neurological: Negative for dizziness, tremors, weakness, numbness and headaches.  Psychiatric/Behavioral: Negative for confusion, sleep disturbance and suicidal ideas.    Objective:  BP 140/76 (BP Location: Left Arm, Patient Position: Sitting, Cuff Size: Normal)   Pulse (!) 103   Temp 98 F (36.7 C) (Oral)   Ht 5\' 1"  (1.549 m)   Wt 179 lb (81.2 kg)   SpO2 98%   BMI 33.82 kg/m   BP Readings from Last 3 Encounters:  02/18/18 140/76  01/18/18 128/76  11/30/17 128/82    Wt Readings from Last 3 Encounters:  02/18/18 179 lb (81.2 kg)  01/18/18 182 lb (82.6 kg)  11/30/17 177 lb (80.3 kg)    Physical Exam  Constitutional: She appears well-developed. No distress.  HENT:  Head: Normocephalic.  Right Ear: External ear normal.  Left Ear: External ear normal.  Nose: Nose normal.  Mouth/Throat: Oropharynx is clear and moist.  Eyes: Pupils are equal, round, and reactive to light. Conjunctivae are normal. Right eye exhibits no discharge. Left eye exhibits no discharge.  Neck: Normal range of motion. Neck supple. No JVD present. No tracheal deviation present. No thyromegaly present.  Cardiovascular: Normal rate, regular rhythm and normal heart sounds.  Pulmonary/Chest: No stridor. No respiratory distress.  She has no wheezes.  Abdominal: Soft. Bowel sounds are normal. She exhibits no distension and no mass. There is no tenderness. There is no rebound and no guarding.  Musculoskeletal: She exhibits edema. She exhibits no tenderness.  Lymphadenopathy:    She has no cervical adenopathy.  Neurological: She displays normal reflexes. No cranial nerve deficit. She exhibits normal muscle tone. Coordination abnormal.  Skin: No rash noted. No erythema.  Psychiatric: She has a normal mood and affect. Her behavior is normal.  Judgment and thought content normal.  ataxic - cane LLE 1-2+ RLE trace-1+  Lab Results  Component Value Date   WBC 7.9 07/31/2017   HGB 14.8 07/31/2017   HCT 42.9 07/31/2017   PLT 255.0 07/31/2017   GLUCOSE 95 07/31/2017   CHOL 175 11/27/2014   TRIG 103.0 11/27/2014   HDL 50.40 11/27/2014   LDLDIRECT 139.3 02/21/2008   LDLCALC 104 (H) 11/27/2014   ALT 20 07/31/2017   AST 19 07/31/2017   NA 140 07/31/2017   K 4.2 07/31/2017   CL 101 07/31/2017   CREATININE 0.61 07/31/2017   BUN 13 07/31/2017   CO2 33 (H) 07/31/2017   TSH 1.77 07/31/2017   HGBA1C 5.5 07/10/2016   MICROALBUR 0.4 06/25/2008    No results found.  Assessment & Plan:   There are no diagnoses linked to this encounter.   No orders of the defined types were placed in this encounter.    Follow-up: No follow-ups on file.  Walker Kehr, MD

## 2018-02-18 NOTE — Assessment & Plan Note (Signed)
Edema is better Cont w/wt loss Unable to use socks

## 2018-02-18 NOTE — Assessment & Plan Note (Signed)
Cane Wt loss

## 2018-02-18 NOTE — Patient Instructions (Signed)
Stasis Dermatitis Stasis dermatitis is a long-term (chronic) skin condition that happens when veins can no longer pump blood back to the heart (poor circulation). This condition causes a red or brown scaly rash or sores (ulcers) from the pooling of blood (stasis). This condition usually affects the lower legs. It may affect one leg or both legs. Without treatment, severe stasis dermatitis can lead to other skin conditions and infections. What are the causes? This condition is caused by poor circulation. What increases the risk? This condition is more likely to develop in people who:  Are not very active.  Stand for long periods of time.  Have veins that have become enlarged and twisted (varicose veins).  Have leg veins that are not strong enough to send blood back to the heart (venous insufficiency).  Have had a blood clot.  Have been pregnant many times.  Have had vein surgery.  Are obese.  Have heart or kidney failure.  Are 3 years of age or older.  What are the signs or symptoms? Common early symptoms of this condition include:  Swelling in your ankle or leg. This might get better overnight but be worse again in the day.  Skin that looks thin on your ankle and leg.  Owens Shark marks that develop slowly.  Skin that is easily irritated or cracked.  Red, swollen skin.  An achy or heavy feeling after you walk or stand for long periods of time.  Pain.  Later and more severe symptoms of this condition include:  Skin that looks shiny.  Small, open sores (ulcers). These are often red or purple.  Dry, cracking skin.  Skin that feels hard.  Severe itching.  A change in the shape or color of your lower legs.  Severe pain.  Difficulty walking.  How is this diagnosed? Your health care provider may suspect this condition from your symptoms and medical history. Your health care provider will also do a physical exam. You may need to see a health care provider who  specializes in skin diseases (dermatologist). You may also have tests to confirm the diagnosis, including:  Blood tests.  Imaging studies to check blood flow (Doppler ultrasound).  Allergy tests.  How is this treated? Treatment for this condition may include medicine, such as:  Corticosteroid creams and ointments.  Non-corticosteroid medicines applied to the skin (topical).  Medicine to reduce swelling in the legs (diuretics).  Antibiotics.  Medicine to relieve itching (antihistamines).  You may also have to wear:  Compression stockings or an elastic wrap to improve circulation.  A bandage (dressing).  A wrap that contains zinc and gelatin (Unna boot).  Follow these instructions at home: [place] your skin as told by your health care provider. Do not use moisturizers with fragrance. This can irritate your skin.  Apply cool compresses to the affected areas.  Do not scratch your skin.  Do not rub your skin dry after a bath or shower. Gently pat your skin dry.  Do not use scented soaps, detergents, or perfumes. Medicines  Take or use over-the-counter and prescription medicines only as told by your health care provider.  If you were prescribed an antibiotic medicine, take or use it as told by your health care provider. Do not stop taking or using the antibiotic even if your condition starts to improve. Lifestyle  Do not stand or sit in one position for long periods of time.  Do not cross your legs when you sit.  Raise (elevate) your  legs above the level of your heart when you are sitting or lying down.  Walk as told by your health care provider. Walking increases blood flow.  Wear comfortable, loose-fitting clothing. Circulation in your legs will be worse if you wear tight pants, belts, and waistbands. General instructions  Change and remove any dressing as told by your health care provider, if this applies.  Wear compression stockings as told by  your health care provider, if this applies. These stockings help to prevent blood clots and reduce swelling in your legs.  Wear the [employer] as told by your health care provider, if this applies.  Keep all follow-up visits as told by your health care provider. This is important. Contact a health care provider if:  Your condition does not improve with treatment.  Your condition gets worse.  You have signs of infection in the affected area. Watch for: ? Swelling. ? Tenderness. ? Redness. ? Soreness. ? Warmth.  You have a fever. Get help right away if:  You notice red streaks coming from the affected area.  Your bone or joint underneath the affected area becomes painful after the skin has healed.  The affected area turns darker.  You feel a deep pain in your leg or groin.  You are short of breath. This information is not intended to replace advice given to you by your health care provider. Make sure you discuss any questions you have with your health care provider. Document Released: 08/31/2005 Document Revised: 01/18/2016 Document Reviewed: 10/07/2014 Elsevier Interactive Patient Education  [employer].

## 2018-02-18 NOTE — Assessment & Plan Note (Signed)
Triamc oint

## 2018-02-18 NOTE — Assessment & Plan Note (Signed)
Better on meds

## 2018-02-19 ENCOUNTER — Other Ambulatory Visit: Payer: Medicare Other

## 2018-02-19 DIAGNOSIS — R8290 Unspecified abnormal findings in urine: Secondary | ICD-10-CM | POA: Diagnosis not present

## 2018-02-21 ENCOUNTER — Other Ambulatory Visit: Payer: Self-pay | Admitting: Internal Medicine

## 2018-02-21 LAB — URINE CULTURE
MICRO NUMBER:: 91114083
SPECIMEN QUALITY: ADEQUATE

## 2018-02-21 MED ORDER — AMPICILLIN 500 MG PO CAPS: 500.0000 mg | ORAL_CAPSULE | Freq: Three times a day (TID) | ORAL | 0 refills | Status: DC

## 2018-03-05 ENCOUNTER — Encounter: Payer: Self-pay | Admitting: Internal Medicine

## 2018-03-05 ENCOUNTER — Ambulatory Visit: Payer: Medicare Other | Admitting: Internal Medicine

## 2018-03-05 DIAGNOSIS — G8929 Other chronic pain: Secondary | ICD-10-CM

## 2018-03-05 DIAGNOSIS — R7309 Other abnormal glucose: Secondary | ICD-10-CM

## 2018-03-05 DIAGNOSIS — E785 Hyperlipidemia, unspecified: Secondary | ICD-10-CM

## 2018-03-05 DIAGNOSIS — M5440 Lumbago with sciatica, unspecified side: Secondary | ICD-10-CM

## 2018-03-05 DIAGNOSIS — I480 Paroxysmal atrial fibrillation: Secondary | ICD-10-CM

## 2018-03-05 DIAGNOSIS — I10 Essential (primary) hypertension: Secondary | ICD-10-CM

## 2018-03-05 DIAGNOSIS — I872 Venous insufficiency (chronic) (peripheral): Secondary | ICD-10-CM

## 2018-03-05 DIAGNOSIS — R609 Edema, unspecified: Secondary | ICD-10-CM

## 2018-03-05 DIAGNOSIS — E538 Deficiency of other specified B group vitamins: Secondary | ICD-10-CM

## 2018-03-05 NOTE — Assessment & Plan Note (Signed)
On B12 

## 2018-03-05 NOTE — Assessment & Plan Note (Signed)
ASA

## 2018-03-05 NOTE — Assessment & Plan Note (Signed)
Walker Wt loss

## 2018-03-05 NOTE — Progress Notes (Signed)
Subjective:  Patient ID: Kathleen Mejia, female    DOB: December 04, 1933  Age: 84 y.o. MRN: [id number]  CC: No chief complaint on file.   HPI Kathleen Mejia presents for edema, obesity, HTN f/u. Lost wt  Outpatient Medications Prior to Visit  Medication Sig Dispense Refill  . ampicillin (PRINCIPEN) 500 MG capsule Take 1 capsule (500 mg total) by mouth 3 (three) times daily. 15 capsule 0  . aspirin 81 MG tablet Take 1 tablet (81 mg total) daily by mouth. 30 tablet 11  . bumetanide (BUMEX) 0.5 MG tablet Take 1 tablet (0.5 mg total) by mouth daily with breakfast. TAKE 1 TABLET (0.5 MG TOTAL) BY MOUTH DAILY in am. 90 tablet 3  . cholecalciferol (VITAMIN D) 1000 units tablet Take 1 tablet (1,000 Units total) daily by mouth. 30 tablet 11  . Cod Liver Oil 1000 MG CAPS Take 1 capsule by mouth 2 (two) times daily.    . Coenzyme Q10 (CO Q 10 PO) Take 1 capsule by mouth daily.    [name] FLAXSEED, LINSEED, PO Take by mouth daily. PATIENT SPRINKLE FLAX SEEDS ONTO HER CEREAL DAILY    . Multiple Minerals (MULTI-MINERALS PO) Take 1 tablet by mouth daily.    . potassium chloride (K-DUR) 10 MEQ tablet Take 1 tablet (10 mEq total) by mouth daily as needed (take with furosemide). 30 tablet 3  . torsemide (DEMADEX) 20 MG tablet Take 2 tablets (40 mg total) by mouth daily. 60 tablet 11  . triamcinolone ointment (KENALOG) 0.1 % Apply 1 application topically 2 (two) times daily as needed. To dry skin on legs 80 g 3  . vitamin C (ASCORBIC ACID) 500 MG tablet Take 500 mg by mouth daily.    . Zinc Sulfate (ZINC 15 PO) Take by mouth.     No facility-administered medications prior to visit.     ROS: Review of Systems  Constitutional: Negative for activity change, appetite change, chills, fatigue and unexpected weight change.  HENT: Negative for congestion, mouth sores and sinus pressure.   Eyes: Negative for visual disturbance.  Respiratory: Negative for cough and chest tightness.   Cardiovascular: Positive for leg  swelling.  Gastrointestinal: Negative for abdominal pain and nausea.  Genitourinary: Negative for difficulty urinating, frequency and vaginal pain.  Musculoskeletal: Positive for arthralgias and back pain. Negative for gait problem.  Skin: Negative for pallor and rash.  Neurological: Positive for weakness. Negative for dizziness, tremors, numbness and headaches.  Psychiatric/Behavioral: Negative for confusion and sleep disturbance.    Objective:  BP 136/82 (BP Location: Left Arm, Patient Position: Sitting, Cuff Size: Normal)   Pulse 89   Temp 98.2 F (36.8 C) (Oral)   Ht 5\' 1"  (1.549 m)   Wt 175 lb (79.4 kg)   SpO2 98%   BMI 33.07 kg/m   BP Readings from Last 3 Encounters:  03/05/18 136/82  02/18/18 140/76  01/18/18 128/76    Wt Readings from Last 3 Encounters:  03/05/18 175 lb (79.4 kg)  02/18/18 179 lb (81.2 kg)  01/18/18 182 lb (82.6 kg)    Physical Exam  Constitutional: She appears well-developed. No distress.  HENT:  Head: Normocephalic.  Right Ear: External ear normal.  Left Ear: External ear normal.  Nose: Nose normal.  Mouth/Throat: Oropharynx is clear and moist.  Eyes: Pupils are equal, round, and reactive to light. Conjunctivae are normal. Right eye exhibits no discharge. Left eye exhibits no discharge.  Neck: Normal range of motion. Neck supple. No JVD present.  No tracheal deviation present. No thyromegaly present.  Cardiovascular: Normal rate, regular rhythm and normal heart sounds.  Pulmonary/Chest: No stridor. No respiratory distress. She has no wheezes.  Abdominal: Soft. Bowel sounds are normal. She exhibits no distension and no mass. There is no tenderness. There is no rebound and no guarding.  Musculoskeletal: She exhibits edema and tenderness.  Lymphadenopathy:    She has no cervical adenopathy.  Neurological: She displays normal reflexes. No cranial nerve deficit. She exhibits normal muscle tone. Coordination abnormal.  Skin: No rash noted. No  erythema.  Psychiatric: She has a normal mood and affect. Her behavior is normal. Judgment and thought content normal.  LLE 1+, RLE trace edema; edema is softer. No erythema Walker obese  Lab Results  Component Value Date   WBC 7.9 07/31/2017   HGB 14.8 07/31/2017   HCT 42.9 07/31/2017   PLT 255.0 07/31/2017   GLUCOSE 121 (H) 02/18/2018   CHOL 175 11/27/2014   TRIG 103.0 11/27/2014   HDL 50.40 11/27/2014   LDLDIRECT 139.3 02/21/2008   LDLCALC 104 (H) 11/27/2014   ALT 20 07/31/2017   AST 19 07/31/2017   NA 139 02/18/2018   K 3.3 (L) 02/18/2018   CL 93 (L) 02/18/2018   CREATININE 0.63 02/18/2018   BUN 13 02/18/2018   CO2 42 (H) 02/18/2018   TSH 1.77 07/31/2017   HGBA1C 5.5 07/10/2016   MICROALBUR 0.4 06/25/2008    No results found.  Assessment & Plan:   There are no diagnoses linked to this encounter.   No orders of the defined types were placed in this encounter.    Follow-up: No follow-ups on file.  Walker Kehr, MD

## 2018-03-05 NOTE — Patient Instructions (Signed)
Try compression socks - on in AM and off in PM

## 2018-03-05 NOTE — Assessment & Plan Note (Signed)
Cont w/wt loss 

## 2018-03-05 NOTE — Assessment & Plan Note (Signed)
Bumex, Torsemide

## 2018-03-05 NOTE — Assessment & Plan Note (Signed)
Pt declined statins 

## 2018-03-05 NOTE — Assessment & Plan Note (Addendum)
[facility]in clinic appt 10/18 Try to use compression socks B  Try compression socks - on in AM and off in PM

## 2018-03-05 NOTE — Assessment & Plan Note (Signed)
Try compression socks - on in AM and off in PM

## 2018-03-08 ENCOUNTER — Ambulatory Visit (INDEPENDENT_AMBULATORY_CARE_PROVIDER_SITE_OTHER): Payer: Medicare Other

## 2018-03-08 DIAGNOSIS — E538 Deficiency of other specified B group vitamins: Secondary | ICD-10-CM | POA: Diagnosis not present

## 2018-03-08 MED ORDER — CYANOCOBALAMIN 1000 MCG/ML IJ SOLN
1000.0000 ug | Freq: Once | INTRAMUSCULAR | Status: AC
  Administered 2018-03-08: 1000 ug via INTRAMUSCULAR

## 2018-03-14 ENCOUNTER — Other Ambulatory Visit: Payer: Self-pay | Admitting: Internal Medicine

## 2018-03-14 MED ORDER — POTASSIUM CHLORIDE ER 10 MEQ PO TBCR: 10.0000 meq | EXTENDED_RELEASE_TABLET | Freq: Every day | ORAL | 3 refills | Status: DC | PRN

## 2018-03-14 NOTE — Telephone Encounter (Signed)
Requested Prescriptions  Pending Prescriptions Disp Refills  . potassium chloride (K-DUR) 10 MEQ tablet 30 tablet 3    Sig: Take 1 tablet (10 mEq total) by mouth daily as needed (take with furosemide).     Endocrinology:  Minerals - Potassium Supplementation Failed - 03/14/2018 12:53 PM      Failed - K in normal range and within 360 days    Potassium  Date Value Ref Range Status  02/18/2018 3.3 (L) 3.5 - 5.1 mEq/L Final         Passed - Cr in normal range and within 360 days    Creatinine, Ser  Date Value Ref Range Status  02/18/2018 0.63 0.40 - 1.20 mg/dL Final         Passed - Valid encounter within last 12 months    Recent Outpatient Visits          1 week ago Venous insufficiency of both lower extremities   [place], Evie Lacks, MD   3 weeks ago Venous insufficiency of both lower extremities   [place], MD   1 month ago Edema, unspecified type   [place], MD   3 months ago Vitamin B12 deficiency   [place], Evie Lacks, MD   4 months ago Edema, unspecified type   [place], MD      Future Appointments            In 2 months Plotnikov, Evie Lacks, MD [place], [place]

## 2018-03-14 NOTE — Telephone Encounter (Signed)
Copied from [place] [phone]. Topic: Quick Communication - Rx Refill/Question >> Mar 14, 2018 12:47 PM Mcneil, Ja-Kwan wrote: Medication: potassium chloride (K-DUR) 10 MEQ tablet  Has the patient contacted their pharmacy? no  Preferred Pharmacy (with phone number or street name): [id number] - [place], [place] - [address] [phone] (Phone) [phone] (Fax)  Agent: Please be advised that RX refills may take up to 3 business days. We ask that you follow-up with your pharmacy.

## 2018-03-20 DIAGNOSIS — H5213 Myopia, bilateral: Secondary | ICD-10-CM | POA: Diagnosis not present

## 2018-03-22 ENCOUNTER — Encounter (HOSPITAL_COMMUNITY): Payer: Medicare Other

## 2018-03-22 ENCOUNTER — Ambulatory Visit (HOSPITAL_COMMUNITY)
Admission: RE | Admit: 2018-03-22 | Discharge: 2018-03-22 | Disposition: A | Discharge status: Home / Self Care | Payer: Medicare Other | Source: Ambulatory Visit | Attending: Vascular Surgery | Admitting: Vascular Surgery

## 2018-03-22 ENCOUNTER — Encounter: Payer: Medicare Other | Admitting: Vascular Surgery

## 2018-03-22 ENCOUNTER — Ambulatory Visit (INDEPENDENT_AMBULATORY_CARE_PROVIDER_SITE_OTHER): Payer: Medicare Other | Admitting: Vascular Surgery

## 2018-03-22 ENCOUNTER — Other Ambulatory Visit: Payer: Self-pay

## 2018-03-22 ENCOUNTER — Encounter: Payer: Self-pay | Admitting: Vascular Surgery

## 2018-03-22 VITALS — BP 151/103 | HR 104 | Temp 97.2°F | Resp 16 | Ht 61.0 in | Wt 174.0 lb

## 2018-03-22 DIAGNOSIS — I872 Venous insufficiency (chronic) (peripheral): Secondary | ICD-10-CM | POA: Insufficient documentation

## 2018-03-22 NOTE — Progress Notes (Signed)
Patient ID: Kathleen Mejia, female   DOB: 1933/10/13, 84 y.o.   MRN: [id number]  Reason for Consult: New Patient (Initial Visit) (bilateral lower extremity edema)   Referred by Plotnikov, Evie Lacks, MD  Subjective:     HPI:  Kathleen Mejia is a 84 y.o. female with edema isolated to her left lower extremity.  States that this has been worsening in the past few months.  She notes that her legs are the same size in the morning but then the left leg gets much larger.  She has had difficulty with compression stockings but has not tried anything gentle.  She does not have any tissue loss or ulceration.  States that she has lost 10 pounds recently and is also been placed on a fluid pill which is helped some.  She previously had cellulitis was using triamcinolone cream this has resolved cellulitis and no longer has any wounds.  Past Medical History:  Diagnosis Date  . Dislocated shoulder 2012   right  . Fatty liver   . Gallstone    1.5 cm  . HTN (hypertension)   . Hyperlipidemia   . LBP (low back pain)   . Osteoarthritis   . Rosacea   . Shortness of breath    with activities and exertion   Family History  Problem Relation Age of Onset  . Pancreatic cancer Mother   . Hypertension Mother   . Breast cancer Mother    Past Surgical History:  Procedure Laterality Date  . CHOLECYSTECTOMY N/A 03/27/2014   Procedure: LAPAROSCOPIC CHOLECYSTECTOMY WITH INTRAOPERATIVE CHOLANGIOGRAM;  Surgeon: Fanny Skates, MD;  Location: [facility];  Service: General;  Laterality: N/A;  . TONSILLECTOMY      Short Social History:  Social History   Tobacco Use  . Smoking status: Never Smoker  . Smokeless tobacco: Never Used  Substance Use Topics  . Alcohol use: No    Allergies  Allergen Reactions  . Eliquis [Apixaban]     HAs, achy  . Losartan     "all side effects and an article from [employer] re Valsartan recall"  . Maxzide [Hydrochlorothiazide W-Triamterene]     Cramps w/a high dose  . Metoprolol    achy    Current Outpatient Medications  Medication Sig Dispense Refill  . aspirin 81 MG tablet Take 1 tablet (81 mg total) daily by mouth. 30 tablet 11  . bumetanide (BUMEX) 0.5 MG tablet Take 1 tablet (0.5 mg total) by mouth daily with breakfast. TAKE 1 TABLET (0.5 MG TOTAL) BY MOUTH DAILY in am. 90 tablet 3  . cholecalciferol (VITAMIN D) 1000 units tablet Take 1 tablet (1,000 Units total) daily by mouth. 30 tablet 11  . Cod Liver Oil 1000 MG CAPS Take 1 capsule by mouth 2 (two) times daily.    . Coenzyme Q10 (CO Q 10 PO) Take 1 capsule by mouth daily.    . potassium chloride (K-DUR) 10 MEQ tablet Take 1 tablet (10 mEq total) by mouth daily as needed (take with furosemide). 30 tablet 3  . torsemide (DEMADEX) 20 MG tablet Take 2 tablets (40 mg total) by mouth daily. 60 tablet 11  . triamcinolone ointment (KENALOG) 0.1 % Apply 1 application topically 2 (two) times daily as needed. To dry skin on legs 80 g 3  . vitamin C (ASCORBIC ACID) 500 MG tablet Take 500 mg by mouth daily.    [name] FLAXSEED, LINSEED, PO Take by mouth daily. PATIENT SPRINKLE FLAX SEEDS ONTO HER CEREAL DAILY    .  Multiple Minerals (MULTI-MINERALS PO) Take 1 tablet by mouth daily.    . Zinc Sulfate (ZINC 15 PO) Take by mouth.     No current facility-administered medications for this visit.     Review of Systems  Constitutional:  Constitutional negative. HENT: HENT negative.  Eyes: Eyes negative.  Respiratory: Respiratory negative.  Cardiovascular: Positive for leg swelling.  GI: Gastrointestinal negative.  GU: Positive for dysuria.  Skin: Positive for rash.  Neurological: Neurological negative. Hematologic: Hematologic/lymphatic negative.  Psychiatric: Psychiatric negative.        Objective:  Objective   Vitals:   03/22/18 1324  BP: (!) 151/103  Pulse: (!) 104  Resp: 16  Temp: (!) 97.2 F (36.2 C)  TempSrc: Oral  SpO2: 99%  Weight: 174 lb (78.9 kg)  Height: 5\' 1"  (1.549 m)   Body mass index is 32.88  kg/m.  Physical Exam  Constitutional: She is oriented to person, place, and time. She appears well-developed.  HENT:  Head: Normocephalic and atraumatic.  Eyes: Pupils are equal, round, and reactive to light.  Neck: Normal range of motion. Neck supple.  Cardiovascular: Normal rate and normal heart sounds.  Pulses:      Dorsalis pedis pulses are 2+ on the right side, and 2+ on the left side.       Posterior tibial pulses are 2+ on the right side, and 2+ on the left side.  No carotid bruits  Pulmonary/Chest: Effort normal and breath sounds normal.  Abdominal: Soft.  Musculoskeletal: Normal range of motion. She exhibits edema.  Right leg 33.5 cm left leg 36 cm measured 10 cm below the tibial tuberosity  Neurological: She is alert and oriented to person, place, and time.  Skin: Skin is warm and dry.    Data: I have independently interpreted her venous reflux study which demonstrates reflux on the right side 5589 ms without any reflux in the left superficial veins.     Assessment/Plan:     84 year old female with isolated left lower extremity swelling.  She needs to getting compression stockings.  She has lost 10 pounds and I congratulated her on this but she does need to lose more weight.  I do not think she needs any further triamcinolone cream.  She should elevate her leg when she is recumbent begin to wear at least gentle compression stockings and her son is going to help her with this.  I can see her on an as-needed basis.     Waynetta Sandy MD [facility]

## 2018-04-08 ENCOUNTER — Ambulatory Visit (INDEPENDENT_AMBULATORY_CARE_PROVIDER_SITE_OTHER): Payer: Medicare Other

## 2018-04-08 DIAGNOSIS — E538 Deficiency of other specified B group vitamins: Secondary | ICD-10-CM | POA: Diagnosis not present

## 2018-04-08 MED ORDER — CYANOCOBALAMIN 1000 MCG/ML IJ SOLN
1000.0000 ug | Freq: Once | INTRAMUSCULAR | Status: AC
  Administered 2018-04-08: 1000 ug via INTRAMUSCULAR

## 2018-05-09 ENCOUNTER — Ambulatory Visit: Payer: Medicare Other

## 2018-05-16 ENCOUNTER — Telehealth: Payer: Self-pay | Admitting: Internal Medicine

## 2018-05-16 ENCOUNTER — Ambulatory Visit (INDEPENDENT_AMBULATORY_CARE_PROVIDER_SITE_OTHER): Payer: Medicare Other

## 2018-05-16 DIAGNOSIS — E538 Deficiency of other specified B group vitamins: Secondary | ICD-10-CM

## 2018-05-16 MED ORDER — BUMETANIDE 0.5 MG PO TABS: 0.5000 mg | ORAL_TABLET | Freq: Every day | ORAL | 2 refills | Status: DC

## 2018-05-16 MED ORDER — CYANOCOBALAMIN 1000 MCG/ML IJ SOLN
1000.0000 ug | Freq: Once | INTRAMUSCULAR | Status: AC
  Administered 2018-05-16: 1000 ug via INTRAMUSCULAR

## 2018-05-16 NOTE — Telephone Encounter (Signed)
Patient was in the office today and requested a refill on her bumetanide (BUMEX) 0.5 MG tablet #90 to be sent to [facility] on [address]. Patient only has two pills left.

## 2018-05-16 NOTE — Telephone Encounter (Signed)
Reviewed chart pt is up-to-date sent refills to [facility]  

## 2018-06-10 ENCOUNTER — Other Ambulatory Visit (INDEPENDENT_AMBULATORY_CARE_PROVIDER_SITE_OTHER): Payer: Medicare Other

## 2018-06-10 ENCOUNTER — Ambulatory Visit (INDEPENDENT_AMBULATORY_CARE_PROVIDER_SITE_OTHER): Payer: Medicare Other | Admitting: Internal Medicine

## 2018-06-10 ENCOUNTER — Encounter: Payer: Self-pay | Admitting: Internal Medicine

## 2018-06-10 VITALS — BP 136/82 | HR 77 | Temp 98.0°F | Ht 61.0 in | Wt 176.0 lb

## 2018-06-10 DIAGNOSIS — N3090 Cystitis, unspecified without hematuria: Secondary | ICD-10-CM

## 2018-06-10 DIAGNOSIS — E538 Deficiency of other specified B group vitamins: Secondary | ICD-10-CM

## 2018-06-10 DIAGNOSIS — R609 Edema, unspecified: Secondary | ICD-10-CM

## 2018-06-10 DIAGNOSIS — I10 Essential (primary) hypertension: Secondary | ICD-10-CM

## 2018-06-10 DIAGNOSIS — R7309 Other abnormal glucose: Secondary | ICD-10-CM

## 2018-06-10 LAB — BASIC METABOLIC PANEL
BUN: 12 mg/dL (ref 6–23)
CALCIUM: 9.8 mg/dL (ref 8.4–10.5)
CO2: 32 meq/L (ref 19–32)
Chloride: 101 mEq/L (ref 96–112)
Creatinine, Ser: 0.52 mg/dL (ref 0.40–1.20)
GFR: 119.7 mL/min (ref >60.00)
GLUCOSE: 98 mg/dL (ref 70–99)
Potassium: 4 mEq/L (ref 3.5–5.1)
SODIUM: 139 meq/L (ref 135–145)

## 2018-06-10 MED ORDER — CIPROFLOXACIN HCL 250 MG PO TABS: 250.0000 mg | ORAL_TABLET | Freq: Every day | ORAL | 1 refills | Status: DC

## 2018-06-10 NOTE — Assessment & Plan Note (Signed)
Better NAS  Loose wt

## 2018-06-10 NOTE — Progress Notes (Signed)
Subjective:  Patient ID: Kathleen Mejia, female    DOB: December 01, 1933  Age: 85 y.o. MRN: [id number]  CC: No chief complaint on file.   HPI Kathleen Mejia presents for edema, obesity f/u. Per Sharla Kidney - pt has been eating a lot of salty soups and sweets  C/o UTI sx's F/u HTN   Outpatient Medications Prior to Visit  Medication Sig Dispense Refill  . aspirin 81 MG tablet Take 1 tablet (81 mg total) daily by mouth. 30 tablet 11  . bumetanide (BUMEX) 0.5 MG tablet Take 1 tablet (0.5 mg total) by mouth daily with breakfast. 90 tablet 2  . Cod Liver Oil 1000 MG CAPS Take 1 capsule by mouth 2 (two) times daily.    . Coenzyme Q10 (CO Q 10 PO) Take 1 capsule by mouth daily.    [name] FLAXSEED, LINSEED, PO Take by mouth daily. PATIENT SPRINKLE FLAX SEEDS ONTO HER CEREAL DAILY    . Multiple Minerals (MULTI-MINERALS PO) Take 1 tablet by mouth daily.    . potassium chloride (K-DUR) 10 MEQ tablet Take 1 tablet (10 mEq total) by mouth daily as needed (take with furosemide). 30 tablet 3  . torsemide (DEMADEX) 20 MG tablet Take 2 tablets (40 mg total) by mouth daily. 60 tablet 11  . triamcinolone ointment (KENALOG) 0.1 % Apply 1 application topically 2 (two) times daily as needed. To dry skin on legs 80 g 3  . vitamin C (ASCORBIC ACID) 500 MG tablet Take 500 mg by mouth daily.    . Zinc Sulfate (ZINC 15 PO) Take by mouth.     No facility-administered medications prior to visit.     ROS: Review of Systems  Constitutional: Negative.  Negative for activity change, appetite change, chills, diaphoresis, fatigue, fever and unexpected weight change.  HENT: Negative for congestion, ear pain, facial swelling, hearing loss, mouth sores, nosebleeds, postnasal drip, rhinorrhea, sinus pressure, sneezing, sore throat, tinnitus and trouble swallowing.   Eyes: Negative for pain, discharge, redness, itching and visual disturbance.  Respiratory: Negative for cough, chest tightness, shortness of breath, wheezing and stridor.     Cardiovascular: Negative for chest pain, palpitations and leg swelling.  Gastrointestinal: Negative for abdominal distention, abdominal pain, anal bleeding, blood in stool, constipation, diarrhea, nausea and rectal pain.  Genitourinary: Negative for difficulty urinating, dysuria, flank pain, frequency, genital sores, hematuria, pelvic pain, urgency, vaginal bleeding, vaginal discharge and vaginal pain.  Musculoskeletal: Positive for back pain and gait problem. Negative for arthralgias, joint swelling, neck pain and neck stiffness.  Skin: Negative.  Negative for pallor and rash.  Neurological: Negative for dizziness, tremors, seizures, syncope, speech difficulty, weakness, numbness and headaches.  Hematological: Negative for adenopathy. Does not bruise/bleed easily.  Psychiatric/Behavioral: Negative for behavioral problems, confusion, decreased concentration, dysphoric mood, sleep disturbance and suicidal ideas. The patient is not nervous/anxious.     Objective:  BP 136/82 (BP Location: Left Arm, Patient Position: Sitting, Cuff Size: Large)   Pulse 77   Temp 98 F (36.7 C) (Oral)   Ht 5\' 1"  (1.549 m)   Wt 176 lb (79.8 kg)   SpO2 96%   BMI 33.25 kg/m   BP Readings from Last 3 Encounters:  06/10/18 136/82  03/22/18 (!) 151/103  03/05/18 136/82    Wt Readings from Last 3 Encounters:  06/10/18 176 lb (79.8 kg)  03/22/18 174 lb (78.9 kg)  03/05/18 175 lb (79.4 kg)    Physical Exam Constitutional:      General: She is not  in acute distress.    Appearance: She is well-developed.  HENT:     Head: Normocephalic.     Right Ear: External ear normal.     Left Ear: External ear normal.     Nose: Nose normal.  Eyes:     General:        Right eye: No discharge.        Left eye: No discharge.     Conjunctiva/sclera: Conjunctivae normal.     Pupils: Pupils are equal, round, and reactive to light.  Neck:     Musculoskeletal: Normal range of motion and neck supple.     Thyroid: No  thyromegaly.     Vascular: No JVD.     Trachea: No tracheal deviation.  Cardiovascular:     Rate and Rhythm: Normal rate and regular rhythm.     Heart sounds: Normal heart sounds.  Pulmonary:     Effort: No respiratory distress.     Breath sounds: No stridor. No wheezing.  Abdominal:     General: Bowel sounds are normal. There is no distension.     Palpations: Abdomen is soft. There is no mass.     Tenderness: There is no abdominal tenderness. There is no guarding or rebound.  Musculoskeletal:        General: No tenderness.     Left lower leg: Edema present.  Lymphadenopathy:     Cervical: No cervical adenopathy.  Skin:    Findings: No erythema or rash.  Neurological:     Cranial Nerves: No cranial nerve deficit.     Motor: No abnormal muscle tone.     Coordination: Coordination abnormal.     Deep Tendon Reflexes: Reflexes normal.  Psychiatric:        Behavior: Behavior normal.        Thought Content: Thought content normal.        Judgment: Judgment normal.   edema is better : R trace, L1+ Walker   Lab Results  Component Value Date   WBC 7.9 07/31/2017   HGB 14.8 07/31/2017   HCT 42.9 07/31/2017   PLT 255.0 07/31/2017   GLUCOSE 121 (H) 02/18/2018   CHOL 175 11/27/2014   TRIG 103.0 11/27/2014   HDL 50.40 11/27/2014   LDLDIRECT 139.3 02/21/2008   LDLCALC 104 (H) 11/27/2014   ALT 20 07/31/2017   AST 19 07/31/2017   NA 139 02/18/2018   K 3.3 (L) 02/18/2018   CL 93 (L) 02/18/2018   CREATININE 0.63 02/18/2018   BUN 13 02/18/2018   CO2 42 (H) 02/18/2018   TSH 1.77 07/31/2017   HGBA1C 5.5 07/10/2016   MICROALBUR 0.4 06/25/2008    Vas [place] Lower Extremity Venous Reflux  Result Date: 03/22/2018  Lower Venous Reflux Study Indications: Swelling.  Performing Technologist: Ralene Cork RVT  Examination Guidelines: A complete evaluation includes B-mode imaging, spectral Doppler, color Doppler, and power Doppler as needed of all accessible portions of each vessel.  Bilateral testing is considered an integral part of a complete examination. Limited examinations for reoccurring indications may be performed as noted. The reflux portion of the exam is performed with the patient in reverse Trendelenburg.  Venous Reflux Times Normal value < 0.5 sec +------------------------------+----------+---------+                               Right (ms)Left (ms) +------------------------------+----------+---------+ CFV  1423.00   +------------------------------+----------+---------+ GSV at Saphenofemoral junction1276.00             +------------------------------+----------+---------+ GSV prox thigh                3565.00             +------------------------------+----------+---------+ GSV mid thigh                 5589.00             +------------------------------+----------+---------+ GSV dist thigh                3667.00             +------------------------------+----------+---------+ GSV at knee                   4753.00             +------------------------------+----------+---------+ Vein Diameters: +------------------------------+----------+---------+                               Right (cm)Left (cm) +------------------------------+----------+---------+ GSV at Saphenofemoral junction0.447     0.493     +------------------------------+----------+---------+ GSV at prox thigh             0.402     0.489     +------------------------------+----------+---------+ GSV at mid thigh              0.355     0.5       +------------------------------+----------+---------+ GSV at distal thigh           0.541     0.511     +------------------------------+----------+---------+ GSV at knee                   0.44      0.398     +------------------------------+----------+---------+ GSV prox calf                 0.286     0.274     +------------------------------+----------+---------+ SSV origin                     0.164     0.128     +------------------------------+----------+---------+ SSV prox                      0.16      0.151     +------------------------------+----------+---------+ SSV mid                       0.16      0.17      +------------------------------+----------+---------+   Summary: Right: Abnormal reflux times were noted in the great saphenous vein at the saphenofemoral junction, great saphenous vein at the proximal thigh, great saphenous vein at the mid thigh, great saphenous vein at the distal thigh, and great saphenous vein at the knee. There is no evidence of deep vein thrombosis in the lower extremity.There is no evidence of superficial venous thrombosis.  Left: Abnormal reflux times were noted in the common femoral vein. There is no evidence of deep vein thrombosis in the lower extremity.There is no evidence of superficial venous thrombosis.  *See table(s) above for measurements and observations. Electronically signed by Servando Snare MD on 03/22/2018 at 5:32:47 PM.    Final     Assessment & Plan:   There are no diagnoses linked to this encounter.   No orders of the defined types were placed  in this encounter.    Follow-up: No follow-ups on file.  Walker Kehr, MD

## 2018-06-10 NOTE — Assessment & Plan Note (Signed)
Labs

## 2018-06-10 NOTE — Assessment & Plan Note (Signed)
On B12 Labs 

## 2018-06-10 NOTE — Assessment & Plan Note (Signed)
BP Readings from Last 3 Encounters:  06/10/18 136/82  03/22/18 (!) 151/103  03/05/18 136/82

## 2018-06-10 NOTE — Assessment & Plan Note (Signed)
Cipro x 3 d

## 2018-06-11 DIAGNOSIS — H40013 Open angle with borderline findings, low risk, bilateral: Secondary | ICD-10-CM | POA: Diagnosis not present

## 2018-06-17 ENCOUNTER — Ambulatory Visit (INDEPENDENT_AMBULATORY_CARE_PROVIDER_SITE_OTHER): Payer: Medicare Other | Admitting: Emergency Medicine

## 2018-06-17 DIAGNOSIS — E538 Deficiency of other specified B group vitamins: Secondary | ICD-10-CM | POA: Diagnosis not present

## 2018-06-17 MED ORDER — CYANOCOBALAMIN 1000 MCG/ML IJ SOLN
1000.0000 ug | Freq: Once | INTRAMUSCULAR | Status: AC
Start: 2018-06-17 — End: 2018-06-17
  Administered 2018-06-17: 1000 ug via INTRAMUSCULAR

## 2018-07-19 ENCOUNTER — Ambulatory Visit (INDEPENDENT_AMBULATORY_CARE_PROVIDER_SITE_OTHER): Payer: Medicare Other

## 2018-07-19 DIAGNOSIS — E538 Deficiency of other specified B group vitamins: Secondary | ICD-10-CM

## 2018-07-19 MED ORDER — CYANOCOBALAMIN 1000 MCG/ML IJ SOLN
1000.0000 ug | Freq: Once | INTRAMUSCULAR | Status: AC
  Administered 2018-07-19: 1000 ug via INTRAMUSCULAR

## 2018-07-19 NOTE — Progress Notes (Signed)
Medical screening examination/treatment/procedure(s) were performed by non-physician practitioner and as supervising physician I was immediately available for consultation/collaboration. I agree with above. James John, MD   

## 2018-08-09 ENCOUNTER — Other Ambulatory Visit: Payer: Self-pay | Admitting: Internal Medicine

## 2018-08-09 DIAGNOSIS — Z1231 Encounter for screening mammogram for malignant neoplasm of breast: Secondary | ICD-10-CM

## 2018-08-19 ENCOUNTER — Ambulatory Visit (INDEPENDENT_AMBULATORY_CARE_PROVIDER_SITE_OTHER): Payer: Medicare Other | Admitting: *Deleted

## 2018-08-19 ENCOUNTER — Other Ambulatory Visit: Payer: Self-pay

## 2018-08-19 ENCOUNTER — Telehealth: Payer: Self-pay | Admitting: Internal Medicine

## 2018-08-19 DIAGNOSIS — E538 Deficiency of other specified B group vitamins: Secondary | ICD-10-CM | POA: Diagnosis not present

## 2018-08-19 MED ORDER — CYANOCOBALAMIN 1000 MCG/ML IJ SOLN
1000.0000 ug | Freq: Once | INTRAMUSCULAR | Status: AC
Start: 2018-08-19 — End: 2018-08-19
  Administered 2018-08-19: 1000 ug via INTRAMUSCULAR

## 2018-08-19 NOTE — Telephone Encounter (Signed)
Patient is requesting refill on bumetanide 0.5 mg to be sent to [facility] at [address].

## 2018-08-22 NOTE — Progress Notes (Signed)
Medical screening examination/treatment/procedure(s) were performed by non-physician practitioner and as supervising physician I was immediately available for consultation/collaboration. I agree with above. Aleeah Greeno, MD  

## 2018-08-23 MED ORDER — BUMETANIDE 0.5 MG PO TABS: 0.5000 mg | ORAL_TABLET | Freq: Every day | ORAL | 2 refills | Status: DC

## 2018-08-23 NOTE — Telephone Encounter (Signed)
RX sent

## 2018-09-10 ENCOUNTER — Telehealth: Payer: Self-pay | Admitting: Internal Medicine

## 2018-09-10 MED ORDER — CEPHALEXIN 500 MG PO CAPS
500.0000 mg | ORAL_CAPSULE | Freq: Four times a day (QID) | ORAL | 0 refills | Status: DC
Start: 2018-09-10 — End: 2018-11-13

## 2018-09-10 NOTE — Telephone Encounter (Signed)
Patient called stating that she thinks she might have a UTI. She is experiencing burning and frequency.  She does not have a ride to bring her to leave a urine sample and is unable to do a virtual visit. She asked if something could be sent in for her. Please advise.

## 2018-09-10 NOTE — Telephone Encounter (Signed)
Pt.notified

## 2018-09-10 NOTE — Telephone Encounter (Signed)
Keflex prescription was emailed to her drugstore.  Thank you

## 2018-09-10 NOTE — Telephone Encounter (Signed)
please advise.

## 2018-09-11 ENCOUNTER — Ambulatory Visit: Payer: Medicare Other | Admitting: Internal Medicine

## 2018-09-19 ENCOUNTER — Ambulatory Visit: Payer: Medicare Other

## 2018-09-23 ENCOUNTER — Ambulatory Visit: Payer: Medicare Other

## 2018-10-29 ENCOUNTER — Ambulatory Visit
Admission: RE | Admit: 2018-10-29 | Discharge: 2018-10-29 | Disposition: A | Discharge status: Home / Self Care | Payer: Medicare Other | Source: Ambulatory Visit | Attending: Internal Medicine | Admitting: Internal Medicine

## 2018-10-29 ENCOUNTER — Other Ambulatory Visit: Payer: Self-pay

## 2018-10-29 DIAGNOSIS — Z1231 Encounter for screening mammogram for malignant neoplasm of breast: Secondary | ICD-10-CM | POA: Diagnosis not present

## 2018-11-13 ENCOUNTER — Other Ambulatory Visit: Payer: Self-pay

## 2018-11-13 ENCOUNTER — Ambulatory Visit (INDEPENDENT_AMBULATORY_CARE_PROVIDER_SITE_OTHER): Payer: Medicare Other | Admitting: Internal Medicine

## 2018-11-13 ENCOUNTER — Encounter: Payer: Self-pay | Admitting: Internal Medicine

## 2018-11-13 ENCOUNTER — Other Ambulatory Visit (INDEPENDENT_AMBULATORY_CARE_PROVIDER_SITE_OTHER): Payer: Medicare Other

## 2018-11-13 DIAGNOSIS — E785 Hyperlipidemia, unspecified: Secondary | ICD-10-CM

## 2018-11-13 DIAGNOSIS — E538 Deficiency of other specified B group vitamins: Secondary | ICD-10-CM

## 2018-11-13 DIAGNOSIS — R609 Edema, unspecified: Secondary | ICD-10-CM

## 2018-11-13 DIAGNOSIS — R269 Unspecified abnormalities of gait and mobility: Secondary | ICD-10-CM | POA: Diagnosis not present

## 2018-11-13 DIAGNOSIS — N8110 Cystocele, unspecified: Secondary | ICD-10-CM | POA: Insufficient documentation

## 2018-11-13 LAB — BASIC METABOLIC PANEL
BUN: 14 mg/dL (ref 6–23)
CO2: 31 mEq/L (ref 19–32)
Calcium: 9.8 mg/dL (ref 8.4–10.5)
Chloride: 100 mEq/L (ref 96–112)
Creatinine, Ser: 0.67 mg/dL (ref 0.40–1.20)
GFR: 83.98 mL/min (ref >60.00)
Glucose, Bld: 99 mg/dL (ref 70–99)
Potassium: 4 mEq/L (ref 3.5–5.1)
Sodium: 139 mEq/L (ref 135–145)

## 2018-11-13 MED ORDER — BUMETANIDE 0.5 MG PO TABS: 0.5000 mg | ORAL_TABLET | Freq: Every day | ORAL | 3 refills | Status: DC

## 2018-11-13 MED ORDER — CYANOCOBALAMIN 1000 MCG/ML IJ SOLN
1000.0000 ug | Freq: Once | INTRAMUSCULAR | Status: AC
  Administered 2018-11-13: 1000 ug via INTRAMUSCULAR

## 2018-11-13 MED ORDER — TORSEMIDE 20 MG PO TABS: 20.0000 mg | ORAL_TABLET | Freq: Every day | ORAL | 3 refills | Status: DC

## 2018-11-13 MED ORDER — POTASSIUM CHLORIDE ER 10 MEQ PO TBCR: 10.0000 meq | EXTENDED_RELEASE_TABLET | Freq: Every day | ORAL | 3 refills | Status: DC | PRN

## 2018-11-13 NOTE — Assessment & Plan Note (Signed)
GYN ref

## 2018-11-13 NOTE — Assessment & Plan Note (Signed)
On shots

## 2018-11-13 NOTE — Assessment & Plan Note (Signed)
Chronic pain Tylenol prn

## 2018-11-13 NOTE — Progress Notes (Signed)
Subjective:  Patient ID: Kathleen Mejia, female    DOB: 11-11-1933  Age: 85 y.o. MRN: [id number]  CC: No chief complaint on file.   HPI Sharena Dibenedetto Ouzts presents for HTN, OA, edema C/o vaginal mass x 1-2 months Not taking torsemide for ?reason  Outpatient Medications Prior to Visit  Medication Sig Dispense Refill  . aspirin 81 MG tablet Take 1 tablet (81 mg total) daily by mouth. 30 tablet 11  . bumetanide (BUMEX) 0.5 MG tablet Take 1 tablet (0.5 mg total) by mouth daily with breakfast. 90 tablet 2  . cephALEXin (KEFLEX) 500 MG capsule Take 1 capsule (500 mg total) by mouth 4 (four) times daily. 20 capsule 0  . ciprofloxacin (CIPRO) 250 MG tablet Take 1 tablet (250 mg total) by mouth daily with breakfast. 3 tablet 1  . Cod Liver Oil 1000 MG CAPS Take 1 capsule by mouth 2 (two) times daily.    . Coenzyme Q10 (CO Q 10 PO) Take 1 capsule by mouth daily.    [name] FLAXSEED, LINSEED, PO Take by mouth daily. PATIENT SPRINKLE FLAX SEEDS ONTO HER CEREAL DAILY    . Multiple Minerals (MULTI-MINERALS PO) Take 1 tablet by mouth daily.    . potassium chloride (K-DUR) 10 MEQ tablet Take 1 tablet (10 mEq total) by mouth daily as needed (take with furosemide). 30 tablet 3  . torsemide (DEMADEX) 20 MG tablet Take 2 tablets (40 mg total) by mouth daily. 60 tablet 11  . triamcinolone ointment (KENALOG) 0.1 % Apply 1 application topically 2 (two) times daily as needed. To dry skin on legs 80 g 3  . vitamin C (ASCORBIC ACID) 500 MG tablet Take 500 mg by mouth daily.    . Zinc Sulfate (ZINC 15 PO) Take by mouth.     No facility-administered medications prior to visit.     ROS: Review of Systems  Constitutional: Negative for activity change, appetite change, chills, fatigue and unexpected weight change.  HENT: Negative for congestion, mouth sores and sinus pressure.   Eyes: Negative for visual disturbance.  Respiratory: Negative for cough and chest tightness.   Gastrointestinal: Negative for abdominal  pain and nausea.  Genitourinary: Negative for difficulty urinating, frequency and vaginal pain.  Musculoskeletal: Positive for arthralgias, back pain and gait problem.  Skin: Negative for pallor and rash.  Neurological: Negative for dizziness, tremors, weakness, numbness and headaches.  Psychiatric/Behavioral: Negative for confusion and sleep disturbance. The patient is nervous/anxious.     Objective:  BP 128/82 (BP Location: Left Arm, Patient Position: Sitting, Cuff Size: Normal)   Pulse (!) 123   Temp 98.1 F (36.7 C) (Oral)   Ht 5\' 1"  (1.549 m)   Wt 172 lb (78 kg)   SpO2 97%   BMI 32.50 kg/m   BP Readings from Last 3 Encounters:  11/13/18 128/82  06/10/18 136/82  03/22/18 (!) 151/103    Wt Readings from Last 3 Encounters:  11/13/18 172 lb (78 kg)  06/10/18 176 lb (79.8 kg)  03/22/18 174 lb (78.9 kg)    Physical Exam Constitutional:      General: She is not in acute distress.    Appearance: She is well-developed.  HENT:     Head: Normocephalic.     Right Ear: External ear normal.     Left Ear: External ear normal.     Nose: Nose normal.  Eyes:     General:        Right eye: No discharge.  Left eye: No discharge.     Conjunctiva/sclera: Conjunctivae normal.     Pupils: Pupils are equal, round, and reactive to light.  Neck:     Musculoskeletal: Normal range of motion and neck supple.     Thyroid: No thyromegaly.     Vascular: No JVD.     Trachea: No tracheal deviation.  Cardiovascular:     Rate and Rhythm: Normal rate and regular rhythm.     Heart sounds: Normal heart sounds.  Pulmonary:     Effort: No respiratory distress.     Breath sounds: No stridor. No wheezing.  Abdominal:     General: Bowel sounds are normal. There is no distension.     Palpations: Abdomen is soft. There is no mass.     Tenderness: There is no abdominal tenderness. There is no guarding or rebound.  Musculoskeletal:        General: No tenderness.  Lymphadenopathy:      Cervical: No cervical adenopathy.  Skin:    Findings: No erythema or rash.  Neurological:     Cranial Nerves: No cranial nerve deficit.     Motor: No abnormal muscle tone.     Coordination: Coordination normal.     Deep Tendon Reflexes: Reflexes normal.  Psychiatric:        Behavior: Behavior normal.        Thought Content: Thought content normal.        Judgment: Judgment normal.   edema 1+  Lab Results  Component Value Date   WBC 7.9 07/31/2017   HGB 14.8 07/31/2017   HCT 42.9 07/31/2017   PLT 255.0 07/31/2017   GLUCOSE 98 06/10/2018   CHOL 175 11/27/2014   TRIG 103.0 11/27/2014   HDL 50.40 11/27/2014   LDLDIRECT 139.3 02/21/2008   LDLCALC 104 (H) 11/27/2014   ALT 20 07/31/2017   AST 19 07/31/2017   NA 139 06/10/2018   K 4.0 06/10/2018   CL 101 06/10/2018   CREATININE 0.52 06/10/2018   BUN 12 06/10/2018   CO2 32 06/10/2018   TSH 1.77 07/31/2017   HGBA1C 5.5 07/10/2016   MICROALBUR 0.4 06/25/2008    Mm Digital Screening Bilateral  Result Date: 10/30/2018 CLINICAL DATA:  Screening. EXAM: DIGITAL SCREENING BILATERAL MAMMOGRAM WITH CAD COMPARISON:  Previous exam(s). ACR Breast Density Category b: There are scattered areas of fibroglandular density. FINDINGS: There are no findings suspicious for malignancy. Images were processed with CAD. IMPRESSION: No mammographic evidence of malignancy. A result letter of this screening mammogram will be mailed directly to the patient. RECOMMENDATION: Screening mammogram in one year. (Code:SM-B-01Y) BI-RADS CATEGORY  1: Negative. Electronically Signed   By: Franki Cabot M.D.   On: 10/30/2018 08:10    Assessment & Plan:   There are no diagnoses linked to this encounter.   No orders of the defined types were placed in this encounter.    Follow-up: No follow-ups on file.  Walker Kehr, MD

## 2018-11-13 NOTE — Assessment & Plan Note (Signed)
-

## 2018-11-13 NOTE — Assessment & Plan Note (Signed)
Bumex Restart torsemide, KCl

## 2018-11-22 ENCOUNTER — Telehealth: Payer: Self-pay | Admitting: Obstetrics and Gynecology

## 2018-11-22 NOTE — Telephone Encounter (Signed)
Called and left a message for patient to call back to schedule a new patient doctor referral appointment with our office to see Sumner Boast, MD re: vaginal prolapse.

## 2018-11-22 NOTE — Telephone Encounter (Signed)
Patient is returning a call to [facility].

## 2018-11-25 ENCOUNTER — Encounter: Payer: Self-pay | Admitting: Obstetrics and Gynecology

## 2018-12-04 ENCOUNTER — Other Ambulatory Visit: Payer: Self-pay

## 2018-12-04 ENCOUNTER — Ambulatory Visit (INDEPENDENT_AMBULATORY_CARE_PROVIDER_SITE_OTHER): Payer: Medicare Other | Admitting: Obstetrics and Gynecology

## 2018-12-04 ENCOUNTER — Encounter: Payer: Self-pay | Admitting: Obstetrics and Gynecology

## 2018-12-04 ENCOUNTER — Telehealth: Payer: Self-pay

## 2018-12-04 VITALS — BP 126/80 | HR 60 | Temp 98.1°F | Ht 61.0 in | Wt 167.0 lb

## 2018-12-04 DIAGNOSIS — N814 Uterovaginal prolapse, unspecified: Secondary | ICD-10-CM

## 2018-12-04 DIAGNOSIS — N3946 Mixed incontinence: Secondary | ICD-10-CM

## 2018-12-04 DIAGNOSIS — R339 Retention of urine, unspecified: Secondary | ICD-10-CM

## 2018-12-04 DIAGNOSIS — N8111 Cystocele, midline: Secondary | ICD-10-CM | POA: Diagnosis not present

## 2018-12-04 DIAGNOSIS — N952 Postmenopausal atrophic vaginitis: Secondary | ICD-10-CM

## 2018-12-04 LAB — POCT URINALYSIS DIPSTICK
Bilirubin, UA: NEGATIVE
Blood, UA: POSITIVE
Glucose, UA: NEGATIVE
Ketones, UA: NEGATIVE
Nitrite, UA: POSITIVE
Protein, UA: NEGATIVE
Spec Grav, UA: 1.01 (ref 1.010–1.025)
Urobilinogen, UA: 0.2 E.U./dL
pH, UA: 5 (ref 5.0–8.0)

## 2018-12-04 MED ORDER — ESTRADIOL 0.1 MG/GM VA CREA: TOPICAL_CREAM | VAGINAL | 1 refills | Status: DC

## 2018-12-04 MED ORDER — SULFAMETHOXAZOLE-TRIMETHOPRIM 800-160 MG PO TABS: 1.0000 | ORAL_TABLET | Freq: Two times a day (BID) | ORAL | 0 refills | Status: DC

## 2018-12-04 NOTE — Telephone Encounter (Signed)
Left message to call Tanyon Alipio at [phone]. 

## 2018-12-04 NOTE — Progress Notes (Signed)
85 y.o. [id number] Widowed White or Caucasian Not Hispanic or Latino female here for evaluation of a vaginal bulge. She first noticed it in 3/20, feels the bulge most of the time. Sometimes when she wakes up it is inside, then comes out when she goes to the bathroom. Uncomfortable to sit down. She has urge urinary incontinence, can leak a lot. Wears depends.  She has started wetting the bed, urge wakes her up and it just comes. She is cutting back on water and other liquids. She has had urinary incontinence with valsalva for a while, that is just small amount. The urge incontinence is worse in the last few months. No dysuria.  She feels she is emptying her bladder. Bowel movements are mostly daily. No abdominal pain.     No LMP recorded. Patient is postmenopausal.          Sexually active: No.  The current method of family planning is post menopausal status.    Exercising: No.  The patient does not participate in regular exercise at present. Smoker:  no  Health Maintenance: Pap:  Unsure History of abnormal Pap:  no MMG:  10/29/2018 Birads 1 negative BMD:   06/25/2013 Colonoscopy: Never TDaP:  Unsure    reports that she has never smoked. She has never used smokeless tobacco. She reports that she does not drink alcohol or use drugs. Son is local, daughter in [place]. No grandchildren.   Past Medical History:  Diagnosis Date  . Dislocated shoulder 2012   right  . Fatty liver   . Gallstone    1.5 cm  . HTN (hypertension)   . Hyperlipidemia   . LBP (low back pain)   . Osteoarthritis   . Rosacea   . Shortness of breath    with activities and exertion    Past Surgical History:  Procedure Laterality Date  . CHOLECYSTECTOMY N/A 03/27/2014   Procedure: LAPAROSCOPIC CHOLECYSTECTOMY WITH INTRAOPERATIVE CHOLANGIOGRAM;  Surgeon: Fanny Skates, MD;  Location: [place];  Service: General;  Laterality: N/A;  . TONSILLECTOMY      Current Outpatient Medications  Medication Sig Dispense Refill  .  aspirin 81 MG tablet Take 1 tablet (81 mg total) daily by mouth. 30 tablet 11  . bumetanide (BUMEX) 0.5 MG tablet Take 1 tablet (0.5 mg total) by mouth daily with breakfast. 90 tablet 3  . Cod Liver Oil 1000 MG CAPS Take 1 capsule by mouth 2 (two) times daily.    . Coenzyme Q10 (CO Q 10 PO) Take 1 capsule by mouth daily.    [name] CRANBERRY PO Take by mouth.    . docusate sodium (COLACE) 100 MG capsule Take 100 mg by mouth 2 (two) times daily.    [name] FLAXSEED, LINSEED, PO Take by mouth daily. PATIENT SPRINKLE FLAX SEEDS ONTO HER CEREAL DAILY    . GARLIC PO Take by mouth.    . Multiple Minerals (MULTI-MINERALS PO) Take 1 tablet by mouth daily.    . potassium chloride (K-DUR) 10 MEQ tablet Take 1 tablet (10 mEq total) by mouth daily as needed (take with furosemide). 90 tablet 3  . torsemide (DEMADEX) 20 MG tablet Take 1 tablet (20 mg total) by mouth daily. 90 tablet 3  . vitamin C (ASCORBIC ACID) 500 MG tablet Take 500 mg by mouth daily.    . Zinc Sulfate (ZINC 15 PO) Take by mouth.     No current facility-administered medications for this visit.     Family History  Problem Relation Age  of Onset  . Pancreatic cancer Mother   . Hypertension Mother   . Breast cancer Mother     Review of Systems  Constitutional: Negative.   HENT: Negative.   Eyes: Negative.   Respiratory: Negative.   Cardiovascular: Negative.   Gastrointestinal: Negative.   Endocrine: Negative.   Genitourinary: Positive for dysuria, frequency and urgency.       Prolapse incontinence  Musculoskeletal: Negative.   Skin: Negative.   Allergic/Immunologic: Negative.   Neurological: Negative.   Hematological: Negative.   Psychiatric/Behavioral: Negative.     Exam:   Pulse 60   Temp 98.1 F (36.7 C) (Skin)   Ht 5\' 1"  (1.549 m)   Wt 167 lb (75.8 kg)   BMI 31.55 kg/m   Weight change: @WEIGHTCHANGE @ Height:   Height: 5\' 1"  (154.9 cm)  Ht Readings from Last 3 Encounters:  12/04/18 5\' 1"  (1.549 m)  11/13/18 5\' 1"  (1.549  m)  06/10/18 5\' 1"  (1.549 m)    General appearance: alert, cooperative and appears stated age Abdomen: soft, non-tender; non distended,  no masses,  no organomegaly   Pelvic: External genitalia:  no lesions              Urethra:  normal appearing urethra with no masses, tenderness or lesions              Bartholins and Skenes: normal                 Vagina: atrophic appearing vagina with normal color and discharge, no lesions  Grade 3-4 cystocele, grade 2 uterine prolapse, no significant rectocele              Cervix: no lesions               Bimanual Exam:  Uterus:  normal size, contour, position, consistency, mobility, non-tender              Adnexa: no mass, fullness, tenderness   Prolapse reduced, I&O cath, PVR 280 cc Fitted with a #5 ring pessary with support, comfortable, walked around the office. On repeat exam it felt too small. Replaced with a #6 ring pessary with support. Feels comfortable. Holding the prolapse with valsalva. She is sore from the exam. Discussed that she could possibly need a larger pessary                 Chaperone was present for exam.  A:  Grade 4 cystocele, grade one uterine prolapse  Urinary retention, recent normal creatinine  Mixed urinary incontinence  P:   Fitted with a #6 ring pessary with support  F/U early next week  Called is estrace cream, she will bring it with her next week. Wants me to show her how to insert it.   Call with concerns  Explained prolapse to the patient and then with her son (at her request)  In addition to the pessary fitting ~30 minutes was spent face to face with the patient, over 50% in counseling.   CC: Dr Alain Marion

## 2018-12-04 NOTE — Telephone Encounter (Signed)
-----   Message from [name], MD sent at 12/04/2018  4:24 PM EDT ----- Please let the patient know that her urine dip looks c/w a UTI. Please call in bactrim ds, 1 po BID x 3 days. Her culture will be back in a few days.

## 2018-12-04 NOTE — Patient Instructions (Signed)
Pelvic Organ Prolapse Pelvic organ prolapse is the stretching, bulging, or dropping of pelvic organs into an abnormal position. It happens when the muscles and tissues that surround and support pelvic structures become weak or stretched. Pelvic organ prolapse can involve the:  Vagina (vaginal prolapse).  Uterus (uterine prolapse).  Bladder (cystocele).  Rectum (rectocele).  Intestines (enterocele). When organs other than the vagina are involved, they often bulge into the vagina or protrude from the vagina, depending on how severe the prolapse is. What are the causes? This condition may be caused by:  Pregnancy, labor, and childbirth.  Past pelvic surgery.  Decreased production of the hormone estrogen associated with menopause.  Consistently lifting more than 50 lb (23 kg).  Obesity.  Long-term inability to pass stool (chronic constipation).  A cough that lasts a long time (chronic).  Buildup of fluid in the abdomen due to certain diseases and other conditions. What are the signs or symptoms? Symptoms of this condition include:  Passing a little urine (loss of bladder control) when you cough, sneeze, strain, and exercise (stress incontinence). This may be worse immediately after childbirth. It may gradually improve over time.  Feeling pressure in your pelvis or vagina. This pressure may increase when you cough or when you are passing stool.  A bulge that protrudes from the opening of your vagina.  Difficulty passing urine or stool.  Pain in your lower back.  Pain, discomfort, or disinterest in sex.  Repeated bladder infections (urinary tract infections).  Difficulty inserting a tampon. In some people, this condition causes no symptoms. How is this diagnosed? This condition may be diagnosed based on a vaginal and rectal exam. During the exam, you may be asked to cough and strain while you are lying down, sitting, and standing up. Your health care provider will  determine if other tests are required, such as bladder function tests. How is this treated? Treatment for this condition may depend on your symptoms. Treatment may include:  Lifestyle changes, such as changes to your diet.  Emptying your bladder at scheduled times (bladder training therapy). This can help reduce or avoid urinary incontinence.  Estrogen. Estrogen may help mild prolapse by increasing the strength and tone of pelvic floor muscles.  Kegel exercises. These may help mild cases of prolapse by strengthening and tightening the muscles of the pelvic floor.  A soft, flexible device that helps support the vaginal walls and keep pelvic organs in place (pessary). This is inserted into your vagina by your health care provider.  Surgery. This is often the only form of treatment for severe prolapse. Follow these instructions at home:  Avoid drinking beverages that contain caffeine or alcohol.  Increase your intake of high-fiber foods. This can help decrease constipation and straining during bowel movements.  Lose weight if recommended by your health care provider.  Wear a sanitary pad or adult diapers if you have urinary incontinence.  Avoid heavy lifting and straining with exercise and work. Do not hold your breath when you perform mild to moderate lifting and exercise activities. Limit your activities as directed by your health care provider.  Do Kegel exercises as directed by your health care provider. To do this: ? Squeeze your pelvic floor muscles tight. You should feel a tight lift in your rectal area and a tightness in your vaginal area. Keep your stomach, buttocks, and legs relaxed. ? Hold the muscles tight for up to 10 seconds. ? Relax your muscles. ? Repeat this exercise 50 times a day,   or as many times as told by your health care provider. Continue to do this exercise for at least 4-6 weeks, or for as long as told by your health care provider.  Take over-the-counter and  prescription medicines only as told by your health care provider.  If you have a pessary, take care of it as told by your health care provider.  Keep all follow-up visits as told by your health care provider. This is important. Contact a health care provider if you:  Have symptoms that interfere with your daily activities or sex life.  Need medicine to help with the discomfort.  Notice bleeding from your vagina that is not related to your period.  Have a fever.  Have pain or bleeding when you urinate.  Have bleeding when you pass stool.  Pass urine when you have sex.  Have chronic constipation.  Have a pessary that falls out.  Have bad smelling vaginal discharge.  Have an unusual, low pain in your abdomen. Summary  Pelvic organ prolapse is the stretching, bulging, or dropping of pelvic organs into an abnormal position. It happens when the muscles and tissues that surround and support pelvic structures become weak or stretched.  When organs other than the vagina are involved, they often bulge into the vagina or protrude from the vagina, depending on how severe the prolapse is.  In most cases, this condition needs to be treated only if it produces symptoms. Treatment may include lifestyle changes, estrogen, Kegel exercises, pessary insertion, or surgery.  Avoid heavy lifting and straining with exercise and work. Do not hold your breath when you perform mild to moderate lifting and exercise activities. Limit your activities as directed by your health care provider. This information is not intended to replace advice given to you by your health care provider. Make sure you discuss any questions you have with your health care provider. Document Released: 12/17/2013 Document Revised: 06/13/2017 Document Reviewed: 06/13/2017 Elsevier Patient Education  2020 [employer]. About Cystocele  Overview  The pelvic organs, including the bladder, are normally supported by pelvic floor  muscles and ligaments.  When these muscles and ligaments are stretched, weakened or torn, the wall between the bladder and the vagina sags or herniates causing a prolapse, sometimes called a cystocele.  This condition may cause discomfort and problems with emptying the bladder.  It can be present in various stages.  Some people are not aware of the changes.  Others may notice changes at the vaginal opening or a feeling of the bladder dropping outside the body.  Causes of a Cystocele  A cystocele is usually caused by muscle straining or stretching during childbirth.  In addition, cystocele is more common after menopause, because the hormone estrogen helps keep the elastic tissues around the pelvic organs strong.  A cystocele is more likely to occur when levels of estrogen decrease.  Other causes include: heavy lifting, chronic coughing, previous pelvic surgery and obesity.  Symptoms  A bladder that has dropped from its normal position may cause: unwanted urine leakage (stress incontinence), frequent urination or urge to urinate, incomplete emptying of the bladder (not feeling bladder relief after emptying), pain or discomfort in the vagina, pelvis, groin, lower back or lower abdomen and frequent urinary tract infections.  Mild cases may not cause any symptoms.  Treatment Options  Pelvic floor (Kegel) exercises:  Strength training the muscles in your genital area  Behavioral changes: Treating and preventing constipation, taking time to empty your bladder properly, learning to  lift properly and/or avoid heavy lifting when possible, stopping smoking, avoiding weight gain and treating a chronic cough or bronchitis.  A pessary: A vaginal support device is sometimes used to help pelvic support caused by muscle and ligament changes.  Surgery: Surgical repair may be necessary if symptoms cannot be managed with exercise, behavioral changes and a pessary.  Surgery is usually considered for severe cases.    2007, [employer]

## 2018-12-05 ENCOUNTER — Telehealth: Payer: Self-pay | Admitting: Internal Medicine

## 2018-12-05 LAB — URINALYSIS, MICROSCOPIC ONLY
Casts: NONE SEEN /lpf
WBC, UA: >30 /hpf — AB (ref 0–5)

## 2018-12-05 NOTE — Telephone Encounter (Signed)
Patient calling and would like a call from [place]. States that she had her GYN appointment and they were asking her about her fluid pills. Patient states that she has some concerns on whether she is taking too many.  Please advise.

## 2018-12-05 NOTE — Telephone Encounter (Signed)
Pt notified Dr. Alain Marion wants her on her medications because he prescribed both medication

## 2018-12-05 NOTE — Telephone Encounter (Signed)
Patient is returning call to [facility]. Patient also stated that pessary fell out into the toilet this morning.

## 2018-12-05 NOTE — Telephone Encounter (Signed)
Spoke with patient. Advised of message as seen below from [place]. Patient verbalizes understanding. Patient states that her pessary fell out in the toilet this morning and she is now feeling her prolapse again. Patient states she is able to use the restroom without pain. Unsure if she is emptying completely. Advised she was not completely emptying her bladder when she was seen in the office yesterday and the importance of being able to do so. Offered appointment today, but patient declines. Offered appointment tomorrow with another provider, but patient declines. Advised if she is unable to empty her bladder or develops any other symptoms needs to be seen in the office. Advised of MD on call if needed over the weekend. Patient has an appointment for Monday 12/09/2018 with Dr.Jertson.   Routing to provider and will close encounter.

## 2018-12-06 ENCOUNTER — Other Ambulatory Visit: Payer: Self-pay | Admitting: Obstetrics and Gynecology

## 2018-12-06 LAB — URINE CULTURE

## 2018-12-06 MED ORDER — CEFUROXIME AXETIL 250 MG PO TABS: 250.0000 mg | ORAL_TABLET | Freq: Two times a day (BID) | ORAL | 0 refills | Status: DC

## 2018-12-06 NOTE — Progress Notes (Signed)
Called patient's son (okay per DPR) and told him that his mother has a bladder infection that is not sensitive to the Bactrim that she is on. I've called in a script for Cefuroxime 250 mg BID x 7 days. I also called the patient and reviewed it with her.

## 2018-12-09 ENCOUNTER — Ambulatory Visit: Payer: Medicare Other | Admitting: Obstetrics and Gynecology

## 2018-12-09 ENCOUNTER — Encounter: Payer: Self-pay | Admitting: Obstetrics and Gynecology

## 2018-12-09 ENCOUNTER — Other Ambulatory Visit: Payer: Self-pay

## 2018-12-09 ENCOUNTER — Ambulatory Visit (INDEPENDENT_AMBULATORY_CARE_PROVIDER_SITE_OTHER): Payer: Medicare Other | Admitting: Obstetrics and Gynecology

## 2018-12-09 VITALS — BP 132/78 | HR 64 | Temp 98.4°F | Wt 167.0 lb

## 2018-12-09 DIAGNOSIS — N3946 Mixed incontinence: Secondary | ICD-10-CM

## 2018-12-09 DIAGNOSIS — N8111 Cystocele, midline: Secondary | ICD-10-CM

## 2018-12-09 DIAGNOSIS — N952 Postmenopausal atrophic vaginitis: Secondary | ICD-10-CM

## 2018-12-09 DIAGNOSIS — N814 Uterovaginal prolapse, unspecified: Secondary | ICD-10-CM

## 2018-12-09 DIAGNOSIS — R339 Retention of urine, unspecified: Secondary | ICD-10-CM

## 2018-12-09 DIAGNOSIS — N3090 Cystitis, unspecified without hematuria: Secondary | ICD-10-CM

## 2018-12-09 NOTE — Patient Instructions (Signed)
Urinary Tract Infection, Adult A urinary tract infection (UTI) is an infection of any part of the urinary tract. The urinary tract includes:  The kidneys.  The ureters.  The bladder.  The urethra. These organs make, store, and get rid of pee (urine) in the body. What are the causes? This is caused by germs (bacteria) in your genital area. These germs grow and cause swelling (inflammation) of your urinary tract. What increases the risk? You are more likely to develop this condition if:  You have a small, thin tube (catheter) to drain pee.  You cannot control when you pee or poop (incontinence).  You are female, and: ? You use these methods to prevent pregnancy: ? A medicine that kills sperm (spermicide). ? A device that blocks sperm (diaphragm). ? You have low levels of a female hormone (estrogen). ? You are pregnant.  You have genes that add to your risk.  You are sexually active.  You take antibiotic medicines.  You have trouble peeing because of: ? A prostate that is bigger than normal, if you are female. ? A blockage in the part of your body that drains pee from the bladder (urethra). ? A kidney stone. ? A nerve condition that affects your bladder (neurogenic bladder). ? Not getting enough to drink. ? Not peeing often enough.  You have other conditions, such as: ? Diabetes. ? A weak disease-fighting system (immune system). ? Sickle cell disease. ? Gout. ? Injury of the spine. What are the signs or symptoms? Symptoms of this condition include:  Needing to pee right away (urgently).  Peeing often.  Peeing small amounts often.  Pain or burning when peeing.  Blood in the pee.  Pee that smells bad or not like normal.  Trouble peeing.  Pee that is cloudy.  Fluid coming from the vagina, if you are female.  Pain in the belly or lower back. Other symptoms include:  Throwing up (vomiting).  No urge to eat.  Feeling mixed up (confused).  Being tired  and grouchy (irritable).  A fever.  Watery poop (diarrhea). How is this treated? This condition may be treated with:  Antibiotic medicine.  Other medicines.  Drinking enough water. Follow these instructions at home:  Medicines  Take over-the-counter and prescription medicines only as told by your doctor.  If you were prescribed an antibiotic medicine, take it as told by your doctor. Do not stop taking it even if you start to feel better. General instructions  Make sure you: ? Pee until your bladder is empty. ? Do not hold pee for a long time. ? Empty your bladder after sex. ? Wipe from front to back after pooping if you are a female. Use each tissue one time when you wipe.  Drink enough fluid to keep your pee pale yellow.  Keep all follow-up visits as told by your doctor. This is important. Contact a doctor if:  You do not get better after 1-2 days.  Your symptoms go away and then come back. Get help right away if:  You have very bad back pain.  You have very bad pain in your lower belly.  You have a fever.  You are sick to your stomach (nauseous).  You are throwing up. Summary  A urinary tract infection (UTI) is an infection of any part of the urinary tract.  This condition is caused by germs in your genital area.  There are many risk factors for a UTI. These include having a small, thin   tube to drain pee and not being able to control when you pee or poop.  Treatment includes antibiotic medicines for germs.  Drink enough fluid to keep your pee pale yellow. This information is not intended to replace advice given to you by your health care provider. Make sure you discuss any questions you have with your health care provider. Document Released: 11/08/2007 Document Revised: 05/09/2018 Document Reviewed: 11/29/2017 Elsevier Patient Education  2020 Elsevier Inc[employer]  

## 2018-12-09 NOTE — Progress Notes (Signed)
GYNECOLOGY  VISIT   HPI: 85 y.o.   Widowed White or Caucasian Not Hispanic or Latino  female   [phone] with No LMP recorded. Patient is postmenopausal.   here for f/u on genital prolapse. She was seen last week with a symptomatic grade 3-4 cystocele and grade 2 uterine prolapse. She had urinary retention with a PVR of  280 cc (recent normal renal function). She was sent home with a #6 ring pessary with support which fell out when she was using the bathroom the day after insertion. She did feel well when the pessary was in.  She was given a script for estrogen cream which she has brought with her today.     GYNECOLOGIC HISTORY: No LMP recorded. Patient is postmenopausal. Contraception:NA Menopausal hormone therapy: no, has a script for estrogen cream        OB History    Gravida  1   Para  1   Term  1   Preterm      AB      Living  2     SAB      TAB      Ectopic      Multiple  1   Live Births  2              Patient Active Problem List   Diagnosis Date Noted  . Vaginal prolapse 11/13/2018  . Stasis dermatitis of both legs 02/18/2018  . Vitamin B12 deficiency 11/30/2017  . Glaucoma 07/31/2017  . Hearing loss 07/31/2017  . Gait disorder 07/31/2017  . Anxiety 02/01/2017  . Melena 05/05/2015  . Atrial fibrillation ([place]) 03/29/2015  . Elevated hemoglobin ([place]) 11/28/2014  . Pain in joint, ankle and foot 04/28/2014  . PVC's (premature ventricular contractions) 03/02/2014  . Abnormal echocardiogram 02/13/2014  . Chest pain, unspecified 01/30/2014  . Abdominal pain, chronic, epigastric 01/30/2014  . Tachycardia 01/30/2014  . Knee pain 12/18/2013  . Paresthesia 06/25/2013  . Obesity 12/11/2012  . Elevated glucose 12/11/2012  . URI (upper respiratory infection) 09/09/2012  . Vertigo 03/11/2012  . DOE (dyspnea on exertion) 01/26/2012  . Neoplasm of uncertain behavior of skin 04/20/2011  . Ganglion cyst 12/22/2010  . Venous insufficiency of leg 09/21/2010  .  Edema 09/21/2010  . SKIN RASH 09/07/2009  . FATTY LIVER DISEASE 12/22/2008  . OSTEOARTHRITIS 12/22/2008  . Cholelithiasis with chronic cholecystitis 07/01/2008  . Cystitis 02/27/2008  . Fatigue 02/27/2008  . ABNORMAL LIVER FUNCTION TESTS 02/27/2008  . Dyslipidemia 08/29/2007  . Disturbance of skin sensation 08/22/2007  . DEFICIENCY, VITAMIN D NOS 02/27/2007  . Essential hypertension 02/27/2007  . LOW BACK PAIN 02/27/2007    Past Medical History:  Diagnosis Date  . Dislocated shoulder 2012   right  . Fatty liver   . Gallstone    1.5 cm  . HTN (hypertension)   . Hyperlipidemia   . LBP (low back pain)   . Osteoarthritis   . Rosacea   . Shortness of breath    with activities and exertion    Past Surgical History:  Procedure Laterality Date  . CHOLECYSTECTOMY N/A 03/27/2014   Procedure: LAPAROSCOPIC CHOLECYSTECTOMY WITH INTRAOPERATIVE CHOLANGIOGRAM;  Surgeon: Fanny Skates, MD;  Location: [place];  Service: General;  Laterality: N/A;  . TONSILLECTOMY      Current Outpatient Medications  Medication Sig Dispense Refill  . aspirin 81 MG tablet Take 1 tablet (81 mg total) daily by mouth. 30 tablet 11  . bumetanide (BUMEX) 0.5 MG tablet  Take 1 tablet (0.5 mg total) by mouth daily with breakfast. 90 tablet 3  . cefUROXime (CEFTIN) 250 MG tablet Take 1 tablet (250 mg total) by mouth 2 (two) times daily with a meal. 14 tablet 0  . Cod Liver Oil 1000 MG CAPS Take 1 capsule by mouth 2 (two) times daily.    . Coenzyme Q10 (CO Q 10 PO) Take 1 capsule by mouth daily.    [name] CRANBERRY PO Take by mouth.    . docusate sodium (COLACE) 100 MG capsule Take 100 mg by mouth 2 (two) times daily.    [name] estradiol (ESTRACE) 0.1 MG/GM vaginal cream 1 gram vaginally twice weekly 42.5 g 1  . FLAXSEED, LINSEED, PO Take by mouth daily. PATIENT SPRINKLE FLAX SEEDS ONTO HER CEREAL DAILY    . GARLIC PO Take by mouth.    . Multiple Minerals (MULTI-MINERALS PO) Take 1 tablet by mouth daily.    . potassium  chloride (K-DUR) 10 MEQ tablet Take 1 tablet (10 mEq total) by mouth daily as needed (take with furosemide). 90 tablet 3  . torsemide (DEMADEX) 20 MG tablet Take 1 tablet (20 mg total) by mouth daily. 90 tablet 3  . vitamin C (ASCORBIC ACID) 500 MG tablet Take 500 mg by mouth daily.    . Zinc Sulfate (ZINC 15 PO) Take by mouth.     No current facility-administered medications for this visit.      ALLERGIES: Eliquis [apixaban], Losartan, Maxzide [hydrochlorothiazide w-triamterene], and Metoprolol  Family History  Problem Relation Age of Onset  . Pancreatic cancer Mother   . Hypertension Mother   . Breast cancer Mother     Social History   Socioeconomic History  . Marital status: Widowed    Spouse name: Not on file  . Number of children: Not on file  . Years of education: Not on file  . Highest education level: Not on file  Occupational History  . Occupation: Retired  Scientific laboratory technician  . Financial resource strain: Not on file  . Food insecurity    Worry: Not on file    Inability: Not on file  . Transportation needs    Medical: Not on file    Non-medical: Not on file  Tobacco Use  . Smoking status: Never Smoker  . Smokeless tobacco: Never Used  Substance and Sexual Activity  . Alcohol use: No  . Drug use: No  . Sexual activity: Not Currently    Birth control/protection: Post-menopausal  Lifestyle  . Physical activity    Days per week: Not on file    Minutes per session: Not on file  . Stress: Not on file  Relationships  . Social Herbalist on phone: Not on file    Gets together: Not on file    Attends religious service: Not on file    Active member of club or organization: Not on file    Attends meetings of clubs or organizations: Not on file    Relationship status: Not on file  . Intimate partner violence    Fear of current or ex partner: Not on file    Emotionally abused: Not on file    Physically abused: Not on file    Forced sexual activity: Not on  file  Other Topics Concern  . Not on file  Social History Narrative   Not taking vaccines          ROS  PHYSICAL EXAMINATION:    There were no vitals  taken for this visit.    General appearance: alert, cooperative and appears stated age  Pelvic: External genitalia:  no lesions              Urethra:  normal appearing urethra with no masses, tenderness or lesions              Bartholins and Skenes: normal                 Vagina: grade 3-4 cystocele, grade 2 uterine prolapse. Cystocele reduced. Fitted with a #7 ring pessary with support. It was comfortable in the office, she walked around, tried to void. After getting dressed and sitting down she felt uncomfortable. On exam the cystocele was bulging slightly in front of the pessary. The pessary was removed (needed a speculum and ringed forceps). The #8 ring pessary with support was then fitted, comfortable with sitting, walking, getting up and down. Felt in place with exam with the patient standing and coughing.   1 gram of estrace cream was placed prior to putting the #7 ring pessary in              Cervix: no lesions  Chaperone was present for exam.  ASSESSMENT Grade 3-4 cystocele, grade 2 uterine prolapse. Fitted with a #6 ring pessary with support last week, fell out at home Urinary retention UTI, on antibiotics Vaginal atrophy    PLAN Fitted with a (#7) then #8 ring pessary with support F/U later this week or early next week Instructed her on use of the vaginal estrogen If this pessary doesn't work will try cube or gellhorn pessary   An After Visit Summary was printed and given to the patient.  In addition to pessary fitting, over 15 minutes face to face time of which over 50% was spent in counseling.

## 2018-12-09 NOTE — Progress Notes (Deleted)
GYNECOLOGY  VISIT   HPI: 85 y.o.   Widowed White or Caucasian Not Hispanic or Latino  female   [phone] with No LMP recorded. Patient is postmenopausal.   here for     GYNECOLOGIC HISTORY: No LMP recorded. Patient is postmenopausal. Contraception:*** Menopausal hormone therapy: ***        OB History    Gravida  1   Para  1   Term  1   Preterm      AB      Living  2     SAB      TAB      Ectopic      Multiple  1   Live Births  2              Patient Active Problem List   Diagnosis Date Noted  . Vaginal prolapse 11/13/2018  . Stasis dermatitis of both legs 02/18/2018  . Vitamin B12 deficiency 11/30/2017  . Glaucoma 07/31/2017  . Hearing loss 07/31/2017  . Gait disorder 07/31/2017  . Anxiety 02/01/2017  . Melena 05/05/2015  . Atrial fibrillation ([place]) 03/29/2015  . Elevated hemoglobin ([place]) 11/28/2014  . Pain in joint, ankle and foot 04/28/2014  . PVC's (premature ventricular contractions) 03/02/2014  . Abnormal echocardiogram 02/13/2014  . Chest pain, unspecified 01/30/2014  . Abdominal pain, chronic, epigastric 01/30/2014  . Tachycardia 01/30/2014  . Knee pain 12/18/2013  . Paresthesia 06/25/2013  . Obesity 12/11/2012  . Elevated glucose 12/11/2012  . URI (upper respiratory infection) 09/09/2012  . Vertigo 03/11/2012  . DOE (dyspnea on exertion) 01/26/2012  . Neoplasm of uncertain behavior of skin 04/20/2011  . Ganglion cyst 12/22/2010  . Venous insufficiency of leg 09/21/2010  . Edema 09/21/2010  . SKIN RASH 09/07/2009  . FATTY LIVER DISEASE 12/22/2008  . OSTEOARTHRITIS 12/22/2008  . Cholelithiasis with chronic cholecystitis 07/01/2008  . Cystitis 02/27/2008  . Fatigue 02/27/2008  . ABNORMAL LIVER FUNCTION TESTS 02/27/2008  . Dyslipidemia 08/29/2007  . Disturbance of skin sensation 08/22/2007  . DEFICIENCY, VITAMIN D NOS 02/27/2007  . Essential hypertension 02/27/2007  . LOW BACK PAIN 02/27/2007    Past Medical History:  Diagnosis  Date  . Dislocated shoulder 2012   right  . Fatty liver   . Gallstone    1.5 cm  . HTN (hypertension)   . Hyperlipidemia   . LBP (low back pain)   . Osteoarthritis   . Rosacea   . Shortness of breath    with activities and exertion    Past Surgical History:  Procedure Laterality Date  . CHOLECYSTECTOMY N/A 03/27/2014   Procedure: LAPAROSCOPIC CHOLECYSTECTOMY WITH INTRAOPERATIVE CHOLANGIOGRAM;  Surgeon: Fanny Skates, MD;  Location: [facility];  Service: General;  Laterality: N/A;  . TONSILLECTOMY      Current Outpatient Medications  Medication Sig Dispense Refill  . aspirin 81 MG tablet Take 1 tablet (81 mg total) daily by mouth. 30 tablet 11  . bumetanide (BUMEX) 0.5 MG tablet Take 1 tablet (0.5 mg total) by mouth daily with breakfast. 90 tablet 3  . cefUROXime (CEFTIN) 250 MG tablet Take 1 tablet (250 mg total) by mouth 2 (two) times daily with a meal. 14 tablet 0  . Cod Liver Oil 1000 MG CAPS Take 1 capsule by mouth 2 (two) times daily.    . Coenzyme Q10 (CO Q 10 PO) Take 1 capsule by mouth daily.    [name] CRANBERRY PO Take by mouth.    . docusate sodium (COLACE) 100 MG  capsule Take 100 mg by mouth 2 (two) times daily.    [name] estradiol (ESTRACE) 0.1 MG/GM vaginal cream 1 gram vaginally twice weekly 42.5 g 1  . FLAXSEED, LINSEED, PO Take by mouth daily. PATIENT SPRINKLE FLAX SEEDS ONTO HER CEREAL DAILY    . GARLIC PO Take by mouth.    . Multiple Minerals (MULTI-MINERALS PO) Take 1 tablet by mouth daily.    . potassium chloride (K-DUR) 10 MEQ tablet Take 1 tablet (10 mEq total) by mouth daily as needed (take with furosemide). 90 tablet 3  . torsemide (DEMADEX) 20 MG tablet Take 1 tablet (20 mg total) by mouth daily. 90 tablet 3  . vitamin C (ASCORBIC ACID) 500 MG tablet Take 500 mg by mouth daily.    . Zinc Sulfate (ZINC 15 PO) Take by mouth.     No current facility-administered medications for this visit.      ALLERGIES: Eliquis [apixaban], Losartan, Maxzide [hydrochlorothiazide  w-triamterene], and Metoprolol  Family History  Problem Relation Age of Onset  . Pancreatic cancer Mother   . Hypertension Mother   . Breast cancer Mother     Social History   Socioeconomic History  . Marital status: Widowed    Spouse name: Not on file  . Number of children: Not on file  . Years of education: Not on file  . Highest education level: Not on file  Occupational History  . Occupation: Retired  Scientific laboratory technician  . Financial resource strain: Not on file  . Food insecurity    Worry: Not on file    Inability: Not on file  . Transportation needs    Medical: Not on file    Non-medical: Not on file  Tobacco Use  . Smoking status: Never Smoker  . Smokeless tobacco: Never Used  Substance and Sexual Activity  . Alcohol use: No  . Drug use: No  . Sexual activity: Not Currently    Birth control/protection: Post-menopausal  Lifestyle  . Physical activity    Days per week: Not on file    Minutes per session: Not on file  . Stress: Not on file  Relationships  . Social Herbalist on phone: Not on file    Gets together: Not on file    Attends religious service: Not on file    Active member of club or organization: Not on file    Attends meetings of clubs or organizations: Not on file    Relationship status: Not on file  . Intimate partner violence    Fear of current or ex partner: Not on file    Emotionally abused: Not on file    Physically abused: Not on file    Forced sexual activity: Not on file  Other Topics Concern  . Not on file  Social History Narrative   Not taking vaccines          ROS  PHYSICAL EXAMINATION:    There were no vitals taken for this visit.    General appearance: alert, cooperative and appears stated age Neck: no adenopathy, supple, symmetrical, trachea midline and thyroid {CHL AMB PHY EX THYROID NORM [phone]::"normal to inspection and palpation"} Breasts: {Exam; breast:13139::"normal appearance, no masses or  tenderness"} Abdomen: soft, non-tender; non distended, no masses,  no organomegaly  Pelvic: External genitalia:  no lesions              Urethra:  normal appearing urethra with no masses, tenderness or lesions  Bartholins and Skenes: normal                 Vagina: normal appearing vagina with normal color and discharge, no lesions              Cervix: {CHL AMB PHY EX CERVIX NORM DEFAULT:[phone]::"no lesions"}              Bimanual Exam:  Uterus:  {CHL AMB PHY EX UTERUS NORM DEFAULT:[id number]::"normal size, contour, position, consistency, mobility, non-tender"}              Adnexa: {CHL AMB PHY EX ADNEXA NO MASS DEFAULT:[phone]::"no mass, fullness, tenderness"}              Rectovaginal: {yes no:[id number]}.  Confirms.              Anus:  normal sphincter tone, no lesions  Chaperone was present for exam.  ASSESSMENT     PLAN    An After Visit Summary was printed and given to the patient.  *** minutes face to face time of which over 50% was spent in counseling.

## 2018-12-10 ENCOUNTER — Telehealth: Payer: Self-pay

## 2018-12-10 NOTE — Telephone Encounter (Signed)
Patient unable to tolerate PO vitamin b12--documented in OV with dr Alain Marion, ok to have b12 injections

## 2018-12-12 ENCOUNTER — Telehealth: Payer: Self-pay | Admitting: Obstetrics and Gynecology

## 2018-12-12 ENCOUNTER — Ambulatory Visit (INDEPENDENT_AMBULATORY_CARE_PROVIDER_SITE_OTHER): Payer: Medicare Other | Admitting: Obstetrics and Gynecology

## 2018-12-12 ENCOUNTER — Other Ambulatory Visit: Payer: Self-pay

## 2018-12-12 ENCOUNTER — Encounter: Payer: Self-pay | Admitting: Obstetrics and Gynecology

## 2018-12-12 VITALS — BP 122/68 | HR 60 | Temp 98.3°F | Wt 167.0 lb

## 2018-12-12 DIAGNOSIS — N8111 Cystocele, midline: Secondary | ICD-10-CM

## 2018-12-12 DIAGNOSIS — N3946 Mixed incontinence: Secondary | ICD-10-CM

## 2018-12-12 DIAGNOSIS — N814 Uterovaginal prolapse, unspecified: Secondary | ICD-10-CM

## 2018-12-12 DIAGNOSIS — R300 Dysuria: Secondary | ICD-10-CM

## 2018-12-12 DIAGNOSIS — N952 Postmenopausal atrophic vaginitis: Secondary | ICD-10-CM

## 2018-12-12 DIAGNOSIS — Z4689 Encounter for fitting and adjustment of other specified devices: Secondary | ICD-10-CM

## 2018-12-12 NOTE — Telephone Encounter (Signed)
Spoke with patient. She states urinating well with pessary. Feels pessary holding everything up well except starting yesterday began feeling "bulge" coming down again. This seems worse when sitting or lying down. She's worried pessary will come out. Made appointment to see Dr.Jertson today at 1:30pm for evaluation. She had follow up for Mon.12/16/18--won't cancel this yet.

## 2018-12-12 NOTE — Progress Notes (Signed)
GYNECOLOGY  VISIT   HPI: 85 y.o.   Widowed White or Caucasian Not Hispanic or Latino  female   [phone] with No LMP recorded. Patient is postmenopausal.   here for pessary check. Reports having intermittent feeling of bulging. The patient was fitted with a #8 ring pessary with support earlier this week. She started to notice an intermittent vaginal bulge while going to the bathroom and sitting down at breakfast today. She is voiding better, still leaking, but not leaking as much at night. Not having to void as frequently at night either. No vaginal bleeding. Still slight burning with urination, on antibiotics, has a few more days. The burning has improved.       GYNECOLOGIC HISTORY: No LMP recorded. Patient is postmenopausal. Contraception: Postmenopausal Menopausal hormone therapy: Estradiol vaginal cream        OB History    Gravida  1   Para  1   Term  1   Preterm      AB      Living  2     SAB      TAB      Ectopic      Multiple  1   Live Births  2              Patient Active Problem List   Diagnosis Date Noted  . Vaginal prolapse 11/13/2018  . Stasis dermatitis of both legs 02/18/2018  . Vitamin B12 deficiency 11/30/2017  . Glaucoma 07/31/2017  . Hearing loss 07/31/2017  . Gait disorder 07/31/2017  . Anxiety 02/01/2017  . Melena 05/05/2015  . Atrial fibrillation ([place]) 03/29/2015  . Elevated hemoglobin ([place]) 11/28/2014  . Pain in joint, ankle and foot 04/28/2014  . PVC's (premature ventricular contractions) 03/02/2014  . Abnormal echocardiogram 02/13/2014  . Chest pain, unspecified 01/30/2014  . Abdominal pain, chronic, epigastric 01/30/2014  . Tachycardia 01/30/2014  . Knee pain 12/18/2013  . Paresthesia 06/25/2013  . Obesity 12/11/2012  . Elevated glucose 12/11/2012  . URI (upper respiratory infection) 09/09/2012  . Vertigo 03/11/2012  . DOE (dyspnea on exertion) 01/26/2012  . Neoplasm of uncertain behavior of skin 04/20/2011  . Ganglion cyst  12/22/2010  . Venous insufficiency of leg 09/21/2010  . Edema 09/21/2010  . SKIN RASH 09/07/2009  . FATTY LIVER DISEASE 12/22/2008  . OSTEOARTHRITIS 12/22/2008  . Cholelithiasis with chronic cholecystitis 07/01/2008  . Cystitis 02/27/2008  . Fatigue 02/27/2008  . ABNORMAL LIVER FUNCTION TESTS 02/27/2008  . Dyslipidemia 08/29/2007  . Disturbance of skin sensation 08/22/2007  . DEFICIENCY, VITAMIN D NOS 02/27/2007  . Essential hypertension 02/27/2007  . LOW BACK PAIN 02/27/2007    Past Medical History:  Diagnosis Date  . Dislocated shoulder 2012   right  . Fatty liver   . Gallstone    1.5 cm  . HTN (hypertension)   . Hyperlipidemia   . LBP (low back pain)   . Osteoarthritis   . Rosacea   . Shortness of breath    with activities and exertion    Past Surgical History:  Procedure Laterality Date  . CHOLECYSTECTOMY N/A 03/27/2014   Procedure: LAPAROSCOPIC CHOLECYSTECTOMY WITH INTRAOPERATIVE CHOLANGIOGRAM;  Surgeon: Fanny Skates, MD;  Location: [facility];  Service: General;  Laterality: N/A;  . TONSILLECTOMY      Current Outpatient Medications  Medication Sig Dispense Refill  . aspirin 81 MG tablet Take 1 tablet (81 mg total) daily by mouth. 30 tablet 11  . bumetanide (BUMEX) 0.5 MG tablet Take 1 tablet (  0.5 mg total) by mouth daily with breakfast. 90 tablet 3  . cefUROXime (CEFTIN) 250 MG tablet Take 1 tablet (250 mg total) by mouth 2 (two) times daily with a meal. 14 tablet 0  . Cod Liver Oil 1000 MG CAPS Take 1 capsule by mouth 2 (two) times daily.    . Coenzyme Q10 (CO Q 10 PO) Take 1 capsule by mouth daily.    [name] CRANBERRY PO Take by mouth.    . docusate sodium (COLACE) 100 MG capsule Take 100 mg by mouth 2 (two) times daily.    [name] estradiol (ESTRACE) 0.1 MG/GM vaginal cream 1 gram vaginally twice weekly 42.5 g 1  . FLAXSEED, LINSEED, PO Take by mouth daily. PATIENT SPRINKLE FLAX SEEDS ONTO HER CEREAL DAILY    . GARLIC PO Take by mouth.    . Multiple Minerals  (MULTI-MINERALS PO) Take 1 tablet by mouth daily.    . potassium chloride (K-DUR) 10 MEQ tablet Take 1 tablet (10 mEq total) by mouth daily as needed (take with furosemide). 90 tablet 3  . torsemide (DEMADEX) 20 MG tablet Take 1 tablet (20 mg total) by mouth daily. 90 tablet 3  . vitamin C (ASCORBIC ACID) 500 MG tablet Take 500 mg by mouth daily.    . Zinc Sulfate (ZINC 15 PO) Take by mouth.     No current facility-administered medications for this visit.      ALLERGIES: Eliquis [apixaban], Losartan, Maxzide [hydrochlorothiazide w-triamterene], and Metoprolol  Family History  Problem Relation Age of Onset  . Pancreatic cancer Mother   . Hypertension Mother   . Breast cancer Mother     Social History   Socioeconomic History  . Marital status: Widowed    Spouse name: Not on file  . Number of children: Not on file  . Years of education: Not on file  . Highest education level: Not on file  Occupational History  . Occupation: Retired  Scientific laboratory technician  . Financial resource strain: Not on file  . Food insecurity    Worry: Not on file    Inability: Not on file  . Transportation needs    Medical: Not on file    Non-medical: Not on file  Tobacco Use  . Smoking status: Never Smoker  . Smokeless tobacco: Never Used  Substance and Sexual Activity  . Alcohol use: No  . Drug use: No  . Sexual activity: Not Currently    Birth control/protection: Post-menopausal  Lifestyle  . Physical activity    Days per week: Not on file    Minutes per session: Not on file  . Stress: Not on file  Relationships  . Social Herbalist on phone: Not on file    Gets together: Not on file    Attends religious service: Not on file    Active member of club or organization: Not on file    Attends meetings of clubs or organizations: Not on file    Relationship status: Not on file  . Intimate partner violence    Fear of current or ex partner: Not on file    Emotionally abused: Not on file     Physically abused: Not on file    Forced sexual activity: Not on file  Other Topics Concern  . Not on file  Social History Narrative   Not taking vaccines          Review of Systems  Constitutional: Negative.   HENT: Negative.   Eyes: Negative.  Respiratory: Negative.   Cardiovascular: Negative.   Gastrointestinal: Negative.   Genitourinary:       Bladder prolapse  Musculoskeletal: Negative.   Skin: Negative.   Neurological: Negative.   Endo/Heme/Allergies: Negative.   Psychiatric/Behavioral: Negative.     PHYSICAL EXAMINATION:    BP 122/68 (BP Location: Right Arm, Patient Position: Sitting, Cuff Size: Normal)   Pulse 60   Temp 98.3 F (36.8 C) (Skin)   Wt 167 lb (75.8 kg)   BMI 31.55 kg/m     General appearance: alert, cooperative and appears stated age  Pelvic: External genitalia:  no lesions              Urethra:  normal appearing urethra with no masses, tenderness or lesions              Bartholins and Skenes: normal                 Vagina: The pessary is deep in her vagina, there is some vaginal tissue in front of the pessary. Needed a large graves speculum and a ringed forceps to remove the pessary. No vaginal irritation. Fitted with a #5 cube pessary (largest one), comfortable in the office.              Cervix: no lesions  Chaperone was present for exam.  ASSESSMENT Grade 3-4 cystocele, grade 2 uterine prolapse. She has a very wide upper vagina, more evident as her prolapse has been reduced. The #8 ring pessary with support is too small, she has a very redundant upper vagina. Vaginal atrophy Mild dysuria, she is on antibiotics for a UTI, her burning has improved, not gone. Urinary retention Mixed incontinence, some improvement with the pessary Nocturia has improved    PLAN Fitted with a #5 cube pessary, comfortable in the office. She was sent home with the fitting pessary (I don't want her cystocele out, she has urinary retention) If the cube doesn't  work will try a gellhorn.  She will finish her antibiotics, if still having burning next week will resend her urine Once she has a pessary that is working, will recheck a PVR for urinary retention.    An After Visit Summary was printed and given to the patient.

## 2018-12-12 NOTE — Telephone Encounter (Signed)
Patient says the "lump" is back.

## 2018-12-13 ENCOUNTER — Ambulatory Visit: Payer: Medicare Other

## 2018-12-13 ENCOUNTER — Ambulatory Visit (INDEPENDENT_AMBULATORY_CARE_PROVIDER_SITE_OTHER): Payer: Medicare Other

## 2018-12-13 ENCOUNTER — Other Ambulatory Visit: Payer: Self-pay

## 2018-12-13 DIAGNOSIS — E538 Deficiency of other specified B group vitamins: Secondary | ICD-10-CM

## 2018-12-13 MED ORDER — CYANOCOBALAMIN 1000 MCG/ML IJ SOLN
1000.0000 ug | Freq: Once | INTRAMUSCULAR | Status: AC
  Administered 2018-12-13: 1000 ug via INTRAMUSCULAR

## 2018-12-13 NOTE — Progress Notes (Signed)
b12 Injection given.   Kathleen Mejia J Tomer Chalmers, MD  

## 2018-12-16 ENCOUNTER — Encounter: Payer: Self-pay | Admitting: Obstetrics and Gynecology

## 2018-12-16 ENCOUNTER — Other Ambulatory Visit: Payer: Self-pay

## 2018-12-16 ENCOUNTER — Ambulatory Visit (INDEPENDENT_AMBULATORY_CARE_PROVIDER_SITE_OTHER): Payer: Medicare Other | Admitting: Obstetrics and Gynecology

## 2018-12-16 VITALS — BP 130/82 | HR 72 | Temp 98.4°F | Wt 167.4 lb

## 2018-12-16 DIAGNOSIS — N3946 Mixed incontinence: Secondary | ICD-10-CM | POA: Diagnosis not present

## 2018-12-16 DIAGNOSIS — N8111 Cystocele, midline: Secondary | ICD-10-CM

## 2018-12-16 DIAGNOSIS — N814 Uterovaginal prolapse, unspecified: Secondary | ICD-10-CM

## 2018-12-16 DIAGNOSIS — N952 Postmenopausal atrophic vaginitis: Secondary | ICD-10-CM | POA: Diagnosis not present

## 2018-12-16 DIAGNOSIS — R300 Dysuria: Secondary | ICD-10-CM

## 2018-12-16 DIAGNOSIS — R339 Retention of urine, unspecified: Secondary | ICD-10-CM

## 2018-12-16 NOTE — Progress Notes (Signed)
GYNECOLOGY  VISIT   HPI: 85 y.o.   Widowed White or Caucasian Not Hispanic or Latino  female   [phone] with No LMP recorded. Patient is postmenopausal.   here for pessary follow up.  The patient has a grade 3-4 cystocele and grade 2 uterine prolapse with a large amount of redundant vaginal mucosa. Prolapses around a #8 ring pessary with support and now with a #5 cube pessary.  The pessary was placed on Friday, started to come out on Saturday, goes in when she lies down. She is voiding better. Less leakage. Little pain before she goes.  Straining with BM makes the prolapse worse.  She is trying to change her diet to get her bowels softer. BM every 2-3 days.  No vaginal bleeding. She was on antibiotics for a UTI. She has slight dysuria at the start of urination.   GYNECOLOGIC HISTORY: No LMP recorded. Patient is postmenopausal. Contraception: Postmenopausal Menopausal hormone therapy: Estrace vaginal cream        OB History    Gravida  1   Para  1   Term  1   Preterm      AB      Living  2     SAB      TAB      Ectopic      Multiple  1   Live Births  2              Patient Active Problem List   Diagnosis Date Noted  . Vaginal prolapse 11/13/2018  . Stasis dermatitis of both legs 02/18/2018  . Vitamin B12 deficiency 11/30/2017  . Glaucoma 07/31/2017  . Hearing loss 07/31/2017  . Gait disorder 07/31/2017  . Anxiety 02/01/2017  . Melena 05/05/2015  . Atrial fibrillation ([place]) 03/29/2015  . Elevated hemoglobin ([place]) 11/28/2014  . Pain in joint, ankle and foot 04/28/2014  . PVC's (premature ventricular contractions) 03/02/2014  . Abnormal echocardiogram 02/13/2014  . Chest pain, unspecified 01/30/2014  . Abdominal pain, chronic, epigastric 01/30/2014  . Tachycardia 01/30/2014  . Knee pain 12/18/2013  . Paresthesia 06/25/2013  . Obesity 12/11/2012  . Elevated glucose 12/11/2012  . URI (upper respiratory infection) 09/09/2012  . Vertigo 03/11/2012  . DOE  (dyspnea on exertion) 01/26/2012  . Neoplasm of uncertain behavior of skin 04/20/2011  . Ganglion cyst 12/22/2010  . Venous insufficiency of leg 09/21/2010  . Edema 09/21/2010  . SKIN RASH 09/07/2009  . FATTY LIVER DISEASE 12/22/2008  . OSTEOARTHRITIS 12/22/2008  . Cholelithiasis with chronic cholecystitis 07/01/2008  . Cystitis 02/27/2008  . Fatigue 02/27/2008  . ABNORMAL LIVER FUNCTION TESTS 02/27/2008  . Dyslipidemia 08/29/2007  . Disturbance of skin sensation 08/22/2007  . DEFICIENCY, VITAMIN D NOS 02/27/2007  . Essential hypertension 02/27/2007  . LOW BACK PAIN 02/27/2007    Past Medical History:  Diagnosis Date  . Dislocated shoulder 2012   right  . Fatty liver   . Gallstone    1.5 cm  . HTN (hypertension)   . Hyperlipidemia   . LBP (low back pain)   . Osteoarthritis   . Rosacea   . Shortness of breath    with activities and exertion    Past Surgical History:  Procedure Laterality Date  . CHOLECYSTECTOMY N/A 03/27/2014   Procedure: LAPAROSCOPIC CHOLECYSTECTOMY WITH INTRAOPERATIVE CHOLANGIOGRAM;  Surgeon: Fanny Skates, MD;  Location: [facility];  Service: General;  Laterality: N/A;  . TONSILLECTOMY      Current Outpatient Medications  Medication Sig Dispense Refill  .  aspirin 81 MG tablet Take 1 tablet (81 mg total) daily by mouth. 30 tablet 11  . bumetanide (BUMEX) 0.5 MG tablet Take 1 tablet (0.5 mg total) by mouth daily with breakfast. 90 tablet 3  . cefUROXime (CEFTIN) 250 MG tablet Take 1 tablet (250 mg total) by mouth 2 (two) times daily with a meal. 14 tablet 0  . Cod Liver Oil 1000 MG CAPS Take 1 capsule by mouth 2 (two) times daily.    . Coenzyme Q10 (CO Q 10 PO) Take 1 capsule by mouth daily.    [name] CRANBERRY PO Take by mouth.    . docusate sodium (COLACE) 100 MG capsule Take 100 mg by mouth 2 (two) times daily.    [name] estradiol (ESTRACE) 0.1 MG/GM vaginal cream 1 gram vaginally twice weekly 42.5 g 1  . FLAXSEED, LINSEED, PO Take by mouth daily. PATIENT  SPRINKLE FLAX SEEDS ONTO HER CEREAL DAILY    . GARLIC PO Take by mouth.    . Multiple Minerals (MULTI-MINERALS PO) Take 1 tablet by mouth daily.    . potassium chloride (K-DUR) 10 MEQ tablet Take 1 tablet (10 mEq total) by mouth daily as needed (take with furosemide). 90 tablet 3  . torsemide (DEMADEX) 20 MG tablet Take 1 tablet (20 mg total) by mouth daily. 90 tablet 3  . vitamin C (ASCORBIC ACID) 500 MG tablet Take 500 mg by mouth daily.    . Zinc Sulfate (ZINC 15 PO) Take by mouth.     No current facility-administered medications for this visit.      ALLERGIES: Eliquis [apixaban], Losartan, Maxzide [hydrochlorothiazide w-triamterene], and Metoprolol  Family History  Problem Relation Age of Onset  . Pancreatic cancer Mother   . Hypertension Mother   . Breast cancer Mother     Social History   Socioeconomic History  . Marital status: Widowed    Spouse name: Not on file  . Number of children: Not on file  . Years of education: Not on file  . Highest education level: Not on file  Occupational History  . Occupation: Retired  Scientific laboratory technician  . Financial resource strain: Not on file  . Food insecurity    Worry: Not on file    Inability: Not on file  . Transportation needs    Medical: Not on file    Non-medical: Not on file  Tobacco Use  . Smoking status: Never Smoker  . Smokeless tobacco: Never Used  Substance and Sexual Activity  . Alcohol use: No  . Drug use: No  . Sexual activity: Not Currently    Birth control/protection: Post-menopausal  Lifestyle  . Physical activity    Days per week: Not on file    Minutes per session: Not on file  . Stress: Not on file  Relationships  . Social Herbalist on phone: Not on file    Gets together: Not on file    Attends religious service: Not on file    Active member of club or organization: Not on file    Attends meetings of clubs or organizations: Not on file    Relationship status: Not on file  . Intimate partner  violence    Fear of current or ex partner: Not on file    Emotionally abused: Not on file    Physically abused: Not on file    Forced sexual activity: Not on file  Other Topics Concern  . Not on file  Social History Narrative  Not taking vaccines          Review of Systems  Constitutional: Negative.   HENT: Negative.   Eyes: Negative.   Respiratory: Negative.   Cardiovascular: Negative.   Gastrointestinal: Negative.   Genitourinary: Positive for dysuria and urgency.       Bladder prolapse  Musculoskeletal: Negative.   Skin: Negative.   Neurological: Negative.   Endo/Heme/Allergies: Negative.   Psychiatric/Behavioral: Negative.     PHYSICAL EXAMINATION:    BP 130/82 (BP Location: Right Arm, Patient Position: Sitting, Cuff Size: Normal)   Pulse 72   Temp 98.4 F (36.9 C) (Skin)   Wt 167 lb 6.4 oz (75.9 kg)   BMI 31.63 kg/m     General appearance: alert, cooperative and appears stated age  Pelvic: External genitalia:  no lesions              Urethra:  normal appearing urethra with no masses, tenderness or lesions              Bartholins and Skenes: normal                 Vagina: normal appearing vagina with normal color and discharge, no lesions  She was not obviously prolapsing in front of the pessary. The cube pessary removed, uncomfortable to remove. Tried to fit with a #3 1/2 gellhorn, too uncomfortable. Fitted with a #3 1/4 inch gellhorn, comfortable with walking around in the office.               Cervix: no lesions              Bimanual Exam:  Uterus:  normal size, contour, position, consistency, mobility, non-tender              Adnexa: no mass, fullness, tenderness               Chaperone was present for exam.  ASSESSMENT Uterine prolapse, grade 2 (no significant rectocele) Grade 3-4 Midline cystocele with resultant urinary retention. She has prolapsed in front of the largest ring pessary (#8) and cube pessary (#5) that are in the fitting kits. I don't feel  she would tolerate a bigger cube pessary Mild dysuria, recently treated for a UTI Vaginal atrophy, she has vaginal estrogen     PLAN Fitted with a #3 1/4 gellhorn pessary (unable to tolerate insertion of a larger gellhorn). If this doesn't work will order the largest ring pessary with support (#13, it is 1 1/4 inches larger than the #8 which was too small) If I can't get a pessary to fit, she will need surgery secondary to her urinary retention with the prolapse Send urine for ua, c&s (unable to void) F/U in 3 days, sooner with problems     An After Visit Summary was printed and given to the patient.  In addition to the pessary fitting, over 15 minutes face to face time of which over 50% was spent in counseling.

## 2018-12-16 NOTE — Patient Instructions (Signed)
Try miralax for constipation

## 2018-12-19 ENCOUNTER — Ambulatory Visit (INDEPENDENT_AMBULATORY_CARE_PROVIDER_SITE_OTHER): Payer: Medicare Other | Admitting: Obstetrics and Gynecology

## 2018-12-19 ENCOUNTER — Other Ambulatory Visit: Payer: Self-pay

## 2018-12-19 ENCOUNTER — Encounter: Payer: Self-pay | Admitting: Obstetrics and Gynecology

## 2018-12-19 ENCOUNTER — Telehealth: Payer: Self-pay | Admitting: Obstetrics and Gynecology

## 2018-12-19 VITALS — BP 128/82 | HR 72 | Temp 98.2°F | Wt 167.0 lb

## 2018-12-19 DIAGNOSIS — N8111 Cystocele, midline: Secondary | ICD-10-CM | POA: Diagnosis not present

## 2018-12-19 DIAGNOSIS — N3946 Mixed incontinence: Secondary | ICD-10-CM | POA: Diagnosis not present

## 2018-12-19 DIAGNOSIS — N952 Postmenopausal atrophic vaginitis: Secondary | ICD-10-CM | POA: Diagnosis not present

## 2018-12-19 DIAGNOSIS — Z4689 Encounter for fitting and adjustment of other specified devices: Secondary | ICD-10-CM

## 2018-12-19 DIAGNOSIS — R339 Retention of urine, unspecified: Secondary | ICD-10-CM

## 2018-12-19 DIAGNOSIS — N814 Uterovaginal prolapse, unspecified: Secondary | ICD-10-CM | POA: Diagnosis not present

## 2018-12-19 NOTE — Progress Notes (Signed)
GYNECOLOGY  VISIT   HPI: 85 y.o.   Widowed White or Caucasian Not Hispanic or Latino  female   [phone] with No LMP recorded. Patient is postmenopausal.   here for pessary recheck.  She was fitted with a #3 1/4 inch Gellhorn pessary on 7/13 and is here for f/u. Unable to tolerate insertion of the #3 1/2 Gellhorn. She doesn't feel the Gellhorn, it's comfortable. She has been straining with BM she notices a slight bulge in front of the pessary. She is sleeping better, not getting up as frequently to void at night, not leaking more than a couple of drops on the way to the bathroom. Normal stream when she voids. Some leakage with walking during the day, but is better. She only notices a small bulge when she is wiping. Not aware of it otherwise. She does feel a shift in her vagina when she stands up and sits down.   No longer burning with urination.   Uterine prolapse, grade 2 (no significant rectocele) Grade 3-4 Midline cystocele with resultant urinary retention. She has prolapsed in front of the largest ring pessary (#8) and cube pessary (#5) that are in the fitting kits. I don't feel she would tolerate a bigger cube pessary  GYNECOLOGIC HISTORY: No LMP recorded. Patient is postmenopausal. Contraception: Postmenopausal Menopausal hormone therapy: Estrace vaginal cream        OB History    Gravida  1   Para  1   Term  1   Preterm      AB      Living  2     SAB      TAB      Ectopic      Multiple  1   Live Births  2              Patient Active Problem List   Diagnosis Date Noted  . Vaginal prolapse 11/13/2018  . Stasis dermatitis of both legs 02/18/2018  . Vitamin B12 deficiency 11/30/2017  . Glaucoma 07/31/2017  . Hearing loss 07/31/2017  . Gait disorder 07/31/2017  . Anxiety 02/01/2017  . Melena 05/05/2015  . Atrial fibrillation ([place]) 03/29/2015  . Elevated hemoglobin ([place]) 11/28/2014  . Pain in joint, ankle and foot 04/28/2014  . PVC's (premature ventricular  contractions) 03/02/2014  . Abnormal echocardiogram 02/13/2014  . Chest pain, unspecified 01/30/2014  . Abdominal pain, chronic, epigastric 01/30/2014  . Tachycardia 01/30/2014  . Knee pain 12/18/2013  . Paresthesia 06/25/2013  . Obesity 12/11/2012  . Elevated glucose 12/11/2012  . URI (upper respiratory infection) 09/09/2012  . Vertigo 03/11/2012  . DOE (dyspnea on exertion) 01/26/2012  . Neoplasm of uncertain behavior of skin 04/20/2011  . Ganglion cyst 12/22/2010  . Venous insufficiency of leg 09/21/2010  . Edema 09/21/2010  . SKIN RASH 09/07/2009  . FATTY LIVER DISEASE 12/22/2008  . OSTEOARTHRITIS 12/22/2008  . Cholelithiasis with chronic cholecystitis 07/01/2008  . Cystitis 02/27/2008  . Fatigue 02/27/2008  . ABNORMAL LIVER FUNCTION TESTS 02/27/2008  . Dyslipidemia 08/29/2007  . Disturbance of skin sensation 08/22/2007  . DEFICIENCY, VITAMIN D NOS 02/27/2007  . Essential hypertension 02/27/2007  . LOW BACK PAIN 02/27/2007    Past Medical History:  Diagnosis Date  . Dislocated shoulder 2012   right  . Fatty liver   . Gallstone    1.5 cm  . HTN (hypertension)   . Hyperlipidemia   . LBP (low back pain)   . Osteoarthritis   . Rosacea   .  Shortness of breath    with activities and exertion    Past Surgical History:  Procedure Laterality Date  . CHOLECYSTECTOMY N/A 03/27/2014   Procedure: LAPAROSCOPIC CHOLECYSTECTOMY WITH INTRAOPERATIVE CHOLANGIOGRAM;  Surgeon: Fanny Skates, MD;  Location: [facility];  Service: General;  Laterality: N/A;  . TONSILLECTOMY      Current Outpatient Medications  Medication Sig Dispense Refill  . aspirin 81 MG tablet Take 1 tablet (81 mg total) daily by mouth. 30 tablet 11  . bumetanide (BUMEX) 0.5 MG tablet Take 1 tablet (0.5 mg total) by mouth daily with breakfast. 90 tablet 3  . cefUROXime (CEFTIN) 250 MG tablet Take 1 tablet (250 mg total) by mouth 2 (two) times daily with a meal. 14 tablet 0  . Cod Liver Oil 1000 MG CAPS Take 1  capsule by mouth 2 (two) times daily.    . Coenzyme Q10 (CO Q 10 PO) Take 1 capsule by mouth daily.    [name] CRANBERRY PO Take by mouth.    . docusate sodium (COLACE) 100 MG capsule Take 100 mg by mouth 2 (two) times daily.    [name] estradiol (ESTRACE) 0.1 MG/GM vaginal cream 1 gram vaginally twice weekly 42.5 g 1  . FLAXSEED, LINSEED, PO Take by mouth daily. PATIENT SPRINKLE FLAX SEEDS ONTO HER CEREAL DAILY    . GARLIC PO Take by mouth.    . Multiple Minerals (MULTI-MINERALS PO) Take 1 tablet by mouth daily.    . potassium chloride (K-DUR) 10 MEQ tablet Take 1 tablet (10 mEq total) by mouth daily as needed (take with furosemide). 90 tablet 3  . torsemide (DEMADEX) 20 MG tablet Take 1 tablet (20 mg total) by mouth daily. 90 tablet 3  . vitamin C (ASCORBIC ACID) 500 MG tablet Take 500 mg by mouth daily.    . Zinc Sulfate (ZINC 15 PO) Take by mouth.     No current facility-administered medications for this visit.      ALLERGIES: Eliquis [apixaban], Losartan, Maxzide [hydrochlorothiazide w-triamterene], and Metoprolol  Family History  Problem Relation Age of Onset  . Pancreatic cancer Mother   . Hypertension Mother   . Breast cancer Mother     Social History   Socioeconomic History  . Marital status: Widowed    Spouse name: Not on file  . Number of children: Not on file  . Years of education: Not on file  . Highest education level: Not on file  Occupational History  . Occupation: Retired  Scientific laboratory technician  . Financial resource strain: Not on file  . Food insecurity    Worry: Not on file    Inability: Not on file  . Transportation needs    Medical: Not on file    Non-medical: Not on file  Tobacco Use  . Smoking status: Never Smoker  . Smokeless tobacco: Never Used  Substance and Sexual Activity  . Alcohol use: No  . Drug use: No  . Sexual activity: Not Currently    Birth control/protection: Post-menopausal  Lifestyle  . Physical activity    Days per week: Not on file    Minutes  per session: Not on file  . Stress: Not on file  Relationships  . Social Herbalist on phone: Not on file    Gets together: Not on file    Attends religious service: Not on file    Active member of club or organization: Not on file    Attends meetings of clubs or organizations: Not on  file    Relationship status: Not on file  . Intimate partner violence    Fear of current or ex partner: Not on file    Emotionally abused: Not on file    Physically abused: Not on file    Forced sexual activity: Not on file  Other Topics Concern  . Not on file  Social History Narrative   Not taking vaccines          Review of Systems  Constitutional: Negative.   HENT: Negative.   Eyes: Negative.   Respiratory: Negative.   Cardiovascular: Negative.   Gastrointestinal: Negative.   Genitourinary:       Bladder prolapse  Musculoskeletal: Negative.   Skin: Negative.   Neurological: Negative.   Endo/Heme/Allergies: Negative.   Psychiatric/Behavioral: Negative.     PHYSICAL EXAMINATION:    BP 128/82 (BP Location: Right Arm, Patient Position: Sitting, Cuff Size: Normal)   Pulse 72   Temp 98.2 F (36.8 C) (Skin)   Wt 175 lb (79.4 kg)   BMI 33.07 kg/m     General appearance: alert, cooperative and appears stated age  Pelvic: External genitalia:  no lesions              Urethra:  normal appearing urethra with no masses, tenderness or lesions              Bartholins and Skenes: normal                 Vagina: When lying down the pessary is in place and she isn't prolapsing in front of it. With standing she is prolapsing in front of the pessary. The patient was placed supine and 1 gram of estrogen cream was placed.    Chaperone was present for exam.  ASSESSMENT Uterine prolapse, grade 2 prolapse.  Grade 3-4 cystocele, having trouble finding a pessary that will work. #3 1/4 Gellhorn is the most comfortable and she can void, but she is prolapsing in front of it. The #3 1/2 was too  uncomfortable to insert.     PLAN Will order the largest ring pessary with support. Will leave this pessary in for now because it enables her to void Call with any concerns F/U next week   An After Visit Summary was printed and given to the patient.   counseling.

## 2018-12-19 NOTE — Telephone Encounter (Signed)
Patient needs follow up 12/26/18 per JJ.

## 2018-12-20 NOTE — Telephone Encounter (Signed)
Left message to call Sharee Pimple, RN at [facility].   Scheduled OV for 12/25/18 at 4:30pm with Dr. Talbert Nan

## 2018-12-23 NOTE — Telephone Encounter (Signed)
Spoke with patient, patient is aware of appt. Agreeable to date and time.   Encounter closed.

## 2018-12-24 NOTE — Progress Notes (Signed)
GYNECOLOGY  VISIT   HPI: 85 y.o.   Widowed White or Caucasian Not Hispanic or Latino  female   [phone] with No LMP recorded. Patient is postmenopausal.   here for pessary check.  She has a grade 3-4 cystocele, grad 2 uterine prolapse and no significant rectocele. She has urinary retention from the prolapse.  She was fitted with a 3 1/4 inch Gellhorn pessary on 7/13, unable to tolerate insertion of the 3 1/2 Gellhorn. She has started to prolapse in front of the pessary in the last few days.   Previously tried a #8 ring pessary and #5 cube pessary (don't feel she could tolerate insertion of a larger cube pessary). She prolapsed around both of them.   GYNECOLOGIC HISTORY: No LMP recorded. Patient is postmenopausal. Contraception: Postmenopausal Menopausal hormone therapy: None        OB History    Gravida  1   Para  1   Term  1   Preterm      AB      Living  2     SAB      TAB      Ectopic      Multiple  1   Live Births  2              Patient Active Problem List   Diagnosis Date Noted  . Vaginal prolapse 11/13/2018  . Stasis dermatitis of both legs 02/18/2018  . Vitamin B12 deficiency 11/30/2017  . Glaucoma 07/31/2017  . Hearing loss 07/31/2017  . Gait disorder 07/31/2017  . Anxiety 02/01/2017  . Melena 05/05/2015  . Atrial fibrillation ([place]) 03/29/2015  . Elevated hemoglobin ([place]) 11/28/2014  . Pain in joint, ankle and foot 04/28/2014  . PVC's (premature ventricular contractions) 03/02/2014  . Abnormal echocardiogram 02/13/2014  . Chest pain, unspecified 01/30/2014  . Abdominal pain, chronic, epigastric 01/30/2014  . Tachycardia 01/30/2014  . Knee pain 12/18/2013  . Paresthesia 06/25/2013  . Obesity 12/11/2012  . Elevated glucose 12/11/2012  . URI (upper respiratory infection) 09/09/2012  . Vertigo 03/11/2012  . DOE (dyspnea on exertion) 01/26/2012  . Neoplasm of uncertain behavior of skin 04/20/2011  . Ganglion cyst 12/22/2010  . Venous  insufficiency of leg 09/21/2010  . Edema 09/21/2010  . SKIN RASH 09/07/2009  . FATTY LIVER DISEASE 12/22/2008  . OSTEOARTHRITIS 12/22/2008  . Cholelithiasis with chronic cholecystitis 07/01/2008  . Cystitis 02/27/2008  . Fatigue 02/27/2008  . ABNORMAL LIVER FUNCTION TESTS 02/27/2008  . Dyslipidemia 08/29/2007  . Disturbance of skin sensation 08/22/2007  . DEFICIENCY, VITAMIN D NOS 02/27/2007  . Essential hypertension 02/27/2007  . LOW BACK PAIN 02/27/2007    Past Medical History:  Diagnosis Date  . Dislocated shoulder 2012   right  . Fatty liver   . Gallstone    1.5 cm  . HTN (hypertension)   . Hyperlipidemia   . LBP (low back pain)   . Osteoarthritis   . Rosacea   . Shortness of breath    with activities and exertion    Past Surgical History:  Procedure Laterality Date  . CHOLECYSTECTOMY N/A 03/27/2014   Procedure: LAPAROSCOPIC CHOLECYSTECTOMY WITH INTRAOPERATIVE CHOLANGIOGRAM;  Surgeon: Fanny Skates, MD;  Location: [facility];  Service: General;  Laterality: N/A;  . TONSILLECTOMY      Current Outpatient Medications  Medication Sig Dispense Refill  . aspirin 81 MG tablet Take 1 tablet (81 mg total) daily by mouth. 30 tablet 11  . bumetanide (BUMEX) 0.5 MG tablet Take  1 tablet (0.5 mg total) by mouth daily with breakfast. 90 tablet 3  . cefUROXime (CEFTIN) 250 MG tablet Take 1 tablet (250 mg total) by mouth 2 (two) times daily with a meal. 14 tablet 0  . Cod Liver Oil 1000 MG CAPS Take 1 capsule by mouth 2 (two) times daily.    . Coenzyme Q10 (CO Q 10 PO) Take 1 capsule by mouth daily.    [name] CRANBERRY PO Take by mouth.    . docusate sodium (COLACE) 100 MG capsule Take 100 mg by mouth 2 (two) times daily.    [name] estradiol (ESTRACE) 0.1 MG/GM vaginal cream 1 gram vaginally twice weekly 42.5 g 1  . FLAXSEED, LINSEED, PO Take by mouth daily. PATIENT SPRINKLE FLAX SEEDS ONTO HER CEREAL DAILY    . GARLIC PO Take by mouth.    . Multiple Minerals (MULTI-MINERALS PO) Take 1 tablet  by mouth daily.    . potassium chloride (K-DUR) 10 MEQ tablet Take 1 tablet (10 mEq total) by mouth daily as needed (take with furosemide). 90 tablet 3  . torsemide (DEMADEX) 20 MG tablet Take 1 tablet (20 mg total) by mouth daily. 90 tablet 3  . vitamin C (ASCORBIC ACID) 500 MG tablet Take 500 mg by mouth daily.    . Zinc Sulfate (ZINC 15 PO) Take by mouth.     No current facility-administered medications for this visit.      ALLERGIES: Eliquis [apixaban], Losartan, Maxzide [hydrochlorothiazide w-triamterene], and Metoprolol  Family History  Problem Relation Age of Onset  . Pancreatic cancer Mother   . Hypertension Mother   . Breast cancer Mother     Social History   Socioeconomic History  . Marital status: Widowed    Spouse name: Not on file  . Number of children: Not on file  . Years of education: Not on file  . Highest education level: Not on file  Occupational History  . Occupation: Retired  Scientific laboratory technician  . Financial resource strain: Not on file  . Food insecurity    Worry: Not on file    Inability: Not on file  . Transportation needs    Medical: Not on file    Non-medical: Not on file  Tobacco Use  . Smoking status: Never Smoker  . Smokeless tobacco: Never Used  Substance and Sexual Activity  . Alcohol use: No  . Drug use: No  . Sexual activity: Not Currently    Birth control/protection: Post-menopausal  Lifestyle  . Physical activity    Days per week: Not on file    Minutes per session: Not on file  . Stress: Not on file  Relationships  . Social Herbalist on phone: Not on file    Gets together: Not on file    Attends religious service: Not on file    Active member of club or organization: Not on file    Attends meetings of clubs or organizations: Not on file    Relationship status: Not on file  . Intimate partner violence    Fear of current or ex partner: Not on file    Emotionally abused: Not on file    Physically abused: Not on file     Forced sexual activity: Not on file  Other Topics Concern  . Not on file  Social History Narrative   Not taking vaccines          Review of Systems  Constitutional: Negative.   HENT: Negative.  Eyes: Negative.   Respiratory: Negative.   Cardiovascular: Negative.   Gastrointestinal: Negative.   Genitourinary:       Bladder prolapse  Musculoskeletal: Negative.   Skin: Negative.   Neurological: Negative.   Endo/Heme/Allergies: Negative.   Psychiatric/Behavioral: Negative.     PHYSICAL EXAMINATION:    BP 132/82 (BP Location: Right Arm, Patient Position: Sitting, Cuff Size: Normal)   Pulse 64   Temp 98.3 F (36.8 C) (Skin)   Wt 167 lb (75.8 kg)   BMI 31.55 kg/m     General appearance: alert, cooperative and appears stated age  Pelvic: External genitalia:  no lesions              Urethra:  normal appearing urethra with no masses, tenderness or lesions              Bartholins and Skenes: normal                 Vagina: normal appearing vagina with normal color and an increase in watery, white vaginal discharge, no lesions. The gellhorn is high in her vagina, prolapsing some in front of it. Needed a speculum to reach the knob, pessary removed. Very uncomfortable. Vagina inspected with the large graves speculum, no lesions or irritation seen, but difficult to exam secondary to the redundancy of her vagina. 2 grams of estrace cream placed.               Cervix: only able to see the anterior lip, no lesions.   Attempted to place the #13 ring pessary with support, could get it in her vagina, too large to open.                Chaperone was present for exam.  ASSESSMENT Grade 3-4 cystocele, grade 2 uterine prolapse. Having difficulty finding a large enough pessary to hold her prolapse, but small enough to fit through the introitus.  Vaginal discharge  PLAN Will order a #10 ring pessary with support (have tried largest gellhorn and cube pessary that she can tolerate and prolapses  in front of it) If I can't fit her with a pessary the only other option is surgery. I'm not confident that she is prolapsing enough posteriorly to perform a colpocleisis (could assess in the OR), other option would be TVH, vault suspension and cystocele repair (with or without TVT), or hysterectomy, anterior repair and sacrocolpopexy.  Affirm sent    An After Visit Summary was printed and given to the patient.  Over 30 minutes face to face time of which over 50% was spent in counseling.

## 2018-12-25 ENCOUNTER — Ambulatory Visit (INDEPENDENT_AMBULATORY_CARE_PROVIDER_SITE_OTHER): Payer: Medicare Other | Admitting: Obstetrics and Gynecology

## 2018-12-25 ENCOUNTER — Other Ambulatory Visit: Payer: Self-pay

## 2018-12-25 ENCOUNTER — Encounter: Payer: Self-pay | Admitting: Obstetrics and Gynecology

## 2018-12-25 VITALS — BP 132/82 | HR 64 | Temp 98.3°F | Wt 167.0 lb

## 2018-12-25 DIAGNOSIS — R339 Retention of urine, unspecified: Secondary | ICD-10-CM

## 2018-12-25 DIAGNOSIS — N898 Other specified noninflammatory disorders of vagina: Secondary | ICD-10-CM

## 2018-12-25 DIAGNOSIS — N952 Postmenopausal atrophic vaginitis: Secondary | ICD-10-CM

## 2018-12-25 DIAGNOSIS — N814 Uterovaginal prolapse, unspecified: Secondary | ICD-10-CM

## 2018-12-25 DIAGNOSIS — Z4689 Encounter for fitting and adjustment of other specified devices: Secondary | ICD-10-CM

## 2018-12-25 DIAGNOSIS — N3946 Mixed incontinence: Secondary | ICD-10-CM

## 2018-12-25 DIAGNOSIS — N8111 Cystocele, midline: Secondary | ICD-10-CM

## 2018-12-26 DIAGNOSIS — N898 Other specified noninflammatory disorders of vagina: Secondary | ICD-10-CM | POA: Diagnosis not present

## 2018-12-27 LAB — VAGINITIS/VAGINOSIS, DNA PROBE
Candida Species: NEGATIVE
Gardnerella vaginalis: NEGATIVE
Trichomonas vaginosis: NEGATIVE

## 2018-12-28 ENCOUNTER — Telehealth: Payer: Self-pay | Admitting: Obstetrics and Gynecology

## 2018-12-28 NOTE — Telephone Encounter (Signed)
The patient called, she was worried that she didn't make an appointment for next week when she left the office on Wednesday. I told her that Gay Filler would call her when the new pessary is in. I have been unable to fit her with a pessary to date. Please see office notes. She states she is doing okay, she has questions about her prolapse and options for surgery. I had discussed with her son that if she had uterine procidentia she could have a colpocleisis, but with her limited posterior vaginal prolapse in the office I'm not confident that would be possible. Again we talked about TVH/anterior repair (would also do uterosacral suspension). We will see if the new pessary will work, if not we will further discuss surgery. Last week I explained to the patient and her son the risk of recurrent prolapse. When discussing her incontinence, it is so much better during the periods of time before she prolapsed in front of the cube or gellhorn pessaries. Main issue is urge incontinence and likely overflow incontinence. She denies significant stress symptoms with the pessary in or out.  She has issues with constipation, has started miralax, not taking it every day. Recommended she take it daily.

## 2018-12-30 ENCOUNTER — Telehealth: Payer: Self-pay | Admitting: Obstetrics and Gynecology

## 2018-12-30 NOTE — Telephone Encounter (Signed)
Kandace Blitz, RN spoke with patient. OV scheduled for 7/28 at 4pm. Patient is agreeable to date and time.   Encounter closed.

## 2018-12-30 NOTE — Telephone Encounter (Signed)
Patient said she was speaking to nurse about an appointment for Tuesday. Her son can bring her early morning or late afternoon.

## 2018-12-30 NOTE — Telephone Encounter (Signed)
Patient said she was taking to a nurse earlier today about coming in today or  tomorrow but was not given a time. She does not remember the name of who she was talking with but cannot come in today.  She asked if someone could call her back with the appointment time tomorrow and she will inform her son. No appointment scheduled tomorrow.

## 2018-12-31 ENCOUNTER — Ambulatory Visit (INDEPENDENT_AMBULATORY_CARE_PROVIDER_SITE_OTHER): Payer: Medicare Other | Admitting: Obstetrics and Gynecology

## 2018-12-31 ENCOUNTER — Other Ambulatory Visit: Payer: Self-pay

## 2018-12-31 ENCOUNTER — Encounter: Payer: Self-pay | Admitting: Obstetrics and Gynecology

## 2018-12-31 VITALS — BP 122/82 | HR 84 | Temp 97.9°F | Wt 167.0 lb

## 2018-12-31 DIAGNOSIS — N3946 Mixed incontinence: Secondary | ICD-10-CM | POA: Diagnosis not present

## 2018-12-31 DIAGNOSIS — N814 Uterovaginal prolapse, unspecified: Secondary | ICD-10-CM | POA: Diagnosis not present

## 2018-12-31 DIAGNOSIS — N8111 Cystocele, midline: Secondary | ICD-10-CM | POA: Diagnosis not present

## 2018-12-31 DIAGNOSIS — Z4689 Encounter for fitting and adjustment of other specified devices: Secondary | ICD-10-CM

## 2018-12-31 DIAGNOSIS — N952 Postmenopausal atrophic vaginitis: Secondary | ICD-10-CM

## 2018-12-31 DIAGNOSIS — R339 Retention of urine, unspecified: Secondary | ICD-10-CM

## 2018-12-31 NOTE — Progress Notes (Signed)
GYNECOLOGY  VISIT   HPI: 85 y.o.   Widowed White or Caucasian Not Hispanic or Latino  female   [phone] with No LMP recorded. Patient is postmenopausal.   here for pessary insertion.   She has a grade 3-4 cystocele, grade 2 uterine prolapse and no significant rectocele. She has urinary retention from the prolapse.  She was fitted with a 3 1/4 inch Gellhorn pessary, unable to tolerate insertion of the 3 1/2 Gellhorn. That pessary worked for several days, but then she prolapsed in front of it. Previously tried a #8 ring pessary and #5 cube pessary (don't feel she could tolerate insertion of a larger cube pessary). She prolapsed around both of them.  Last week we tried a #13 ring pessary with support, it was too large. She is here today to try a #10 ring pessary (the largest pessaries are not in the pessary fitting kit and need to be ordered individually)  GYNECOLOGIC HISTORY: No LMP recorded. Patient is postmenopausal. Contraception: Postmenopausal Menopausal hormone therapy: Estrace vaginal cream       OB History    Gravida  1   Para  1   Term  1   Preterm      AB      Living  2     SAB      TAB      Ectopic      Multiple  1   Live Births  2              Patient Active Problem List   Diagnosis Date Noted  . Vaginal prolapse 11/13/2018  . Stasis dermatitis of both legs 02/18/2018  . Vitamin B12 deficiency 11/30/2017  . Glaucoma 07/31/2017  . Hearing loss 07/31/2017  . Gait disorder 07/31/2017  . Anxiety 02/01/2017  . Melena 05/05/2015  . Atrial fibrillation ([place]) 03/29/2015  . Elevated hemoglobin ([place]) 11/28/2014  . Pain in joint, ankle and foot 04/28/2014  . PVC's (premature ventricular contractions) 03/02/2014  . Abnormal echocardiogram 02/13/2014  . Chest pain, unspecified 01/30/2014  . Abdominal pain, chronic, epigastric 01/30/2014  . Tachycardia 01/30/2014  . Knee pain 12/18/2013  . Paresthesia 06/25/2013  . Obesity 12/11/2012  . Elevated glucose  12/11/2012  . URI (upper respiratory infection) 09/09/2012  . Vertigo 03/11/2012  . DOE (dyspnea on exertion) 01/26/2012  . Neoplasm of uncertain behavior of skin 04/20/2011  . Ganglion cyst 12/22/2010  . Venous insufficiency of leg 09/21/2010  . Edema 09/21/2010  . SKIN RASH 09/07/2009  . FATTY LIVER DISEASE 12/22/2008  . OSTEOARTHRITIS 12/22/2008  . Cholelithiasis with chronic cholecystitis 07/01/2008  . Cystitis 02/27/2008  . Fatigue 02/27/2008  . ABNORMAL LIVER FUNCTION TESTS 02/27/2008  . Dyslipidemia 08/29/2007  . Disturbance of skin sensation 08/22/2007  . DEFICIENCY, VITAMIN D NOS 02/27/2007  . Essential hypertension 02/27/2007  . LOW BACK PAIN 02/27/2007    Past Medical History:  Diagnosis Date  . Dislocated shoulder 2012   right  . Fatty liver   . Gallstone    1.5 cm  . HTN (hypertension)   . Hyperlipidemia   . LBP (low back pain)   . Osteoarthritis   . Rosacea   . Shortness of breath    with activities and exertion    Past Surgical History:  Procedure Laterality Date  . CHOLECYSTECTOMY N/A 03/27/2014   Procedure: LAPAROSCOPIC CHOLECYSTECTOMY WITH INTRAOPERATIVE CHOLANGIOGRAM;  Surgeon: Fanny Skates, MD;  Location: [facility];  Service: General;  Laterality: N/A;  . TONSILLECTOMY  Current Outpatient Medications  Medication Sig Dispense Refill  . aspirin 81 MG tablet Take 1 tablet (81 mg total) daily by mouth. 30 tablet 11  . bumetanide (BUMEX) 0.5 MG tablet Take 1 tablet (0.5 mg total) by mouth daily with breakfast. 90 tablet 3  . cefUROXime (CEFTIN) 250 MG tablet Take 1 tablet (250 mg total) by mouth 2 (two) times daily with a meal. 14 tablet 0  . Cod Liver Oil 1000 MG CAPS Take 1 capsule by mouth 2 (two) times daily.    . Coenzyme Q10 (CO Q 10 PO) Take 1 capsule by mouth daily.    [name] CRANBERRY PO Take by mouth.    . docusate sodium (COLACE) 100 MG capsule Take 100 mg by mouth 2 (two) times daily.    [name] estradiol (ESTRACE) 0.1 MG/GM vaginal cream 1 gram  vaginally twice weekly 42.5 g 1  . FLAXSEED, LINSEED, PO Take by mouth daily. PATIENT SPRINKLE FLAX SEEDS ONTO HER CEREAL DAILY    . GARLIC PO Take by mouth.    . Multiple Minerals (MULTI-MINERALS PO) Take 1 tablet by mouth daily.    . polyethylene glycol (MIRALAX / GLYCOLAX) 17 g packet Take 17 g by mouth daily.    . potassium chloride (K-DUR) 10 MEQ tablet Take 1 tablet (10 mEq total) by mouth daily as needed (take with furosemide). 90 tablet 3  . torsemide (DEMADEX) 20 MG tablet Take 1 tablet (20 mg total) by mouth daily. 90 tablet 3  . vitamin C (ASCORBIC ACID) 500 MG tablet Take 500 mg by mouth daily.    . Zinc Sulfate (ZINC 15 PO) Take by mouth.     No current facility-administered medications for this visit.      ALLERGIES: Eliquis [apixaban], Losartan, Maxzide [hydrochlorothiazide w-triamterene], and Metoprolol  Family History  Problem Relation Age of Onset  . Pancreatic cancer Mother   . Hypertension Mother   . Breast cancer Mother     Social History   Socioeconomic History  . Marital status: Widowed    Spouse name: Not on file  . Number of children: Not on file  . Years of education: Not on file  . Highest education level: Not on file  Occupational History  . Occupation: Retired  Scientific laboratory technician  . Financial resource strain: Not on file  . Food insecurity    Worry: Not on file    Inability: Not on file  . Transportation needs    Medical: Not on file    Non-medical: Not on file  Tobacco Use  . Smoking status: Never Smoker  . Smokeless tobacco: Never Used  Substance and Sexual Activity  . Alcohol use: No  . Drug use: No  . Sexual activity: Not Currently    Birth control/protection: Post-menopausal  Lifestyle  . Physical activity    Days per week: Not on file    Minutes per session: Not on file  . Stress: Not on file  Relationships  . Social Herbalist on phone: Not on file    Gets together: Not on file    Attends religious service: Not on file     Active member of club or organization: Not on file    Attends meetings of clubs or organizations: Not on file    Relationship status: Not on file  . Intimate partner violence    Fear of current or ex partner: Not on file    Emotionally abused: Not on file    Physically  abused: Not on file    Forced sexual activity: Not on file  Other Topics Concern  . Not on file  Social History Narrative   Not taking vaccines          Review of Systems  Constitutional: Negative.   HENT: Negative.   Eyes: Negative.   Respiratory: Negative.   Cardiovascular: Negative.   Gastrointestinal: Positive for constipation.  Genitourinary: Positive for dysuria.       Bladder prolapse  Musculoskeletal: Negative.   Skin: Negative.   Neurological: Negative.   Endo/Heme/Allergies: Negative.   Psychiatric/Behavioral: Negative.     PHYSICAL EXAMINATION:    BP 122/82 (BP Location: Right Arm, Patient Position: Sitting, Cuff Size: Normal)   Pulse 84   Temp 97.9 F (36.6 C) (Skin)   Wt 167 lb (75.8 kg)   BMI 31.55 kg/m     General appearance: alert, cooperative and appears stated age  Pelvic: External genitalia:  no lesions              Urethra:  normal appearing urethra with no masses, tenderness or lesions              Bartholins and Skenes: normal                 Vagina: cystocele only extending slightly out of her vagina today while lying down. The #10 ring pessary with support was placed. Felt comfortable in the office, able to void after insertion. Unable to rotate the pessary well (uncomfortable for the patient). After walking and while standing a small gap was felt in front of the pessary which she could potentially prolapse through, not currently prolapsing. This gap is not felt when she is lying down.  1 gram of estrace placed.   Chaperone was present for exam.  ASSESSMENT Grade 3-4 cystocele, grade 2 uterine prolapse. Difficulty finding a pessary that can go through her introitus and  hold her prolapse.  Urinary incontinence, mainly urge and overflow, much better when she isn't prolapsing in front of her pessary H/O urinary retention prior to pessary insertion (normal renal function)    PLAN Trial of #10 ring pessary with support F/U early next week, call with any problems between now and then. Will continue vaginal estrogen (so far I have been placing it for her.    An After Visit Summary was printed and given to the patient.  ~15 minutes face to face time of which over 50% was spent in counseling.

## 2019-01-02 NOTE — Progress Notes (Signed)
GYNECOLOGY  VISIT   HPI: 85 y.o.   Widowed White or Caucasian Not Hispanic or Latino  female   [phone] with No LMP recorded. Patient is postmenopausal.   here for pessary check.  She has a grade 3-4 cystocele, grade 2 uterine prolapse and no significant rectocele. She has urinary retention from the prolapse.  She was fitted with a 3 1/4 inch Gellhorn pessary, unable to tolerate insertion of the 3 1/2 Gellhorn. That pessary worked for several days, but then she prolapsed in front of it. Previously tried a #8 ring pessary and #5 cube pessary (don't feel she could tolerate insertion of a larger cube pessary). She prolapsed around both of them. Two weeks agp we tried a #13 ring pessary with support, it was too large. Last week she was fitted with a  #10 ring pessary.  She doesn't feel the bulge, but is feeling more leakage. Initially she was voiding normally, she wasn't leaking at all for the first few days. Now leaking again.   GYNECOLOGIC HISTORY: No LMP recorded. Patient is postmenopausal. Contraception: Postmenopausal Menopausal hormone therapy: Estradiol vaginal cream        OB History    Gravida  1   Para  1   Term  1   Preterm      AB      Living  2     SAB      TAB      Ectopic      Multiple  1   Live Births  2              Patient Active Problem List   Diagnosis Date Noted  . Vaginal prolapse 11/13/2018  . Stasis dermatitis of both legs 02/18/2018  . Vitamin B12 deficiency 11/30/2017  . Glaucoma 07/31/2017  . Hearing loss 07/31/2017  . Gait disorder 07/31/2017  . Anxiety 02/01/2017  . Melena 05/05/2015  . Atrial fibrillation ([place]) 03/29/2015  . Elevated hemoglobin ([place]) 11/28/2014  . Pain in joint, ankle and foot 04/28/2014  . PVC's (premature ventricular contractions) 03/02/2014  . Abnormal echocardiogram 02/13/2014  . Chest pain, unspecified 01/30/2014  . Abdominal pain, chronic, epigastric 01/30/2014  . Tachycardia 01/30/2014  . Knee pain  12/18/2013  . Paresthesia 06/25/2013  . Obesity 12/11/2012  . Elevated glucose 12/11/2012  . URI (upper respiratory infection) 09/09/2012  . Vertigo 03/11/2012  . DOE (dyspnea on exertion) 01/26/2012  . Neoplasm of uncertain behavior of skin 04/20/2011  . Ganglion cyst 12/22/2010  . Venous insufficiency of leg 09/21/2010  . Edema 09/21/2010  . SKIN RASH 09/07/2009  . FATTY LIVER DISEASE 12/22/2008  . OSTEOARTHRITIS 12/22/2008  . Cholelithiasis with chronic cholecystitis 07/01/2008  . Cystitis 02/27/2008  . Fatigue 02/27/2008  . ABNORMAL LIVER FUNCTION TESTS 02/27/2008  . Dyslipidemia 08/29/2007  . Disturbance of skin sensation 08/22/2007  . DEFICIENCY, VITAMIN D NOS 02/27/2007  . Essential hypertension 02/27/2007  . LOW BACK PAIN 02/27/2007    Past Medical History:  Diagnosis Date  . Dislocated shoulder 2012   right  . Fatty liver   . Gallstone    1.5 cm  . HTN (hypertension)   . Hyperlipidemia   . LBP (low back pain)   . Osteoarthritis   . Rosacea   . Shortness of breath    with activities and exertion    Past Surgical History:  Procedure Laterality Date  . CHOLECYSTECTOMY N/A 03/27/2014   Procedure: LAPAROSCOPIC CHOLECYSTECTOMY WITH INTRAOPERATIVE CHOLANGIOGRAM;  Surgeon: Fanny Skates, MD;  Location: [facility];  Service: General;  Laterality: N/A;  . TONSILLECTOMY      Current Outpatient Medications  Medication Sig Dispense Refill  . aspirin 81 MG tablet Take 1 tablet (81 mg total) daily by mouth. 30 tablet 11  . bumetanide (BUMEX) 0.5 MG tablet Take 1 tablet (0.5 mg total) by mouth daily with breakfast. 90 tablet 3  . cefUROXime (CEFTIN) 250 MG tablet Take 1 tablet (250 mg total) by mouth 2 (two) times daily with a meal. 14 tablet 0  . Cod Liver Oil 1000 MG CAPS Take 1 capsule by mouth 2 (two) times daily.    . Coenzyme Q10 (CO Q 10 PO) Take 1 capsule by mouth daily.    [name] CRANBERRY PO Take by mouth.    . docusate sodium (COLACE) 100 MG capsule Take 100 mg by  mouth 2 (two) times daily.    [name] estradiol (ESTRACE) 0.1 MG/GM vaginal cream 1 gram vaginally twice weekly 42.5 g 1  . FLAXSEED, LINSEED, PO Take by mouth daily. PATIENT SPRINKLE FLAX SEEDS ONTO HER CEREAL DAILY    . GARLIC PO Take by mouth.    . Multiple Minerals (MULTI-MINERALS PO) Take 1 tablet by mouth daily.    . polyethylene glycol (MIRALAX / GLYCOLAX) 17 g packet Take 17 g by mouth daily.    . potassium chloride (K-DUR) 10 MEQ tablet Take 1 tablet (10 mEq total) by mouth daily as needed (take with furosemide). 90 tablet 3  . torsemide (DEMADEX) 20 MG tablet Take 1 tablet (20 mg total) by mouth daily. 90 tablet 3  . vitamin C (ASCORBIC ACID) 500 MG tablet Take 500 mg by mouth daily.    . Zinc Sulfate (ZINC 15 PO) Take by mouth.     No current facility-administered medications for this visit.      ALLERGIES: Eliquis [apixaban], Losartan, Maxzide [hydrochlorothiazide w-triamterene], and Metoprolol  Family History  Problem Relation Age of Onset  . Pancreatic cancer Mother   . Hypertension Mother   . Breast cancer Mother     Social History   Socioeconomic History  . Marital status: Widowed    Spouse name: Not on file  . Number of children: Not on file  . Years of education: Not on file  . Highest education level: Not on file  Occupational History  . Occupation: Retired  Scientific laboratory technician  . Financial resource strain: Not on file  . Food insecurity    Worry: Not on file    Inability: Not on file  . Transportation needs    Medical: Not on file    Non-medical: Not on file  Tobacco Use  . Smoking status: Never Smoker  . Smokeless tobacco: Never Used  Substance and Sexual Activity  . Alcohol use: No  . Drug use: No  . Sexual activity: Not Currently    Birth control/protection: Post-menopausal  Lifestyle  . Physical activity    Days per week: Not on file    Minutes per session: Not on file  . Stress: Not on file  Relationships  . Social Herbalist on phone: Not  on file    Gets together: Not on file    Attends religious service: Not on file    Active member of club or organization: Not on file    Attends meetings of clubs or organizations: Not on file    Relationship status: Not on file  . Intimate partner violence    Fear of current or  ex partner: Not on file    Emotionally abused: Not on file    Physically abused: Not on file    Forced sexual activity: Not on file  Other Topics Concern  . Not on file  Social History Narrative   Not taking vaccines          Review of Systems  Constitutional: Negative.   HENT: Negative.   Eyes: Negative.   Respiratory: Negative.   Cardiovascular: Negative.   Gastrointestinal: Negative.   Genitourinary:       Urinary leakage  Musculoskeletal: Negative.   Skin: Negative.   Neurological: Negative.   Endo/Heme/Allergies: Negative.   Psychiatric/Behavioral: Negative.     PHYSICAL EXAMINATION:    BP 138/80 (BP Location: Right Arm, Patient Position: Sitting, Cuff Size: Normal)   Pulse 84   Temp 98.2 F (36.8 C) (Skin)   Wt 167 lb (75.8 kg)   BMI 31.55 kg/m     General appearance: alert, cooperative and appears stated age  Pelvic: External genitalia:  no lesions              Urethra:  normal appearing urethra with no masses, tenderness or lesions              Bartholins and Skenes: normal                 Vagina: The #10 ring pessary is up high in her vagina, anterior to the pubic symphysis, prolapsing in front of it slightly.Her vagina is very redundant, very difficult to remove the pessary, needed the largest speculum and a ringed forcep to grasp the edge of the pessary. Pessary removed.               Cervix:no lesions, grasped with a tenaculum, the cervix comes to the introitus, not a lot of posterior relaxation.   Chaperone was present for exam.  ASSESSMENT Grade 3-4 cystocele, grade 2 uterine prolapse, no significant rectocele, very large, redundant vagina. Unable to find a pessary to hold  her prolapse.     PLAN Recommend surgery, happy to send her for a second opinion Would likely need TVH, cystocele repair, cystoscopy. Under anesthesia if there was enough prolapse, could attempt colpocleisis.  Discussed possible risks, including: bleeding, infection, damage to surrounding organs (bowel, bladder, ureters), need for further surgery and risk of recurrent prolapse. She wants to discuss with her son. Gave me verbal permission to speak to her son and or daughter. We discussed that not emptying her bladder completely is not safe for her long term. Discussed trying to reduce the cystocele to void.  She would need preop clearance from Dr Alain Marion   An After Visit Summary was printed and given to the patient.  Over 25 minutes face to face time of which over 50% was spent in counseling.

## 2019-01-06 ENCOUNTER — Other Ambulatory Visit: Payer: Self-pay

## 2019-01-06 ENCOUNTER — Ambulatory Visit (INDEPENDENT_AMBULATORY_CARE_PROVIDER_SITE_OTHER): Payer: Medicare Other | Admitting: Obstetrics and Gynecology

## 2019-01-06 ENCOUNTER — Encounter: Payer: Self-pay | Admitting: Obstetrics and Gynecology

## 2019-01-06 VITALS — BP 138/80 | HR 84 | Temp 98.2°F | Wt 167.0 lb

## 2019-01-06 DIAGNOSIS — Z4689 Encounter for fitting and adjustment of other specified devices: Secondary | ICD-10-CM | POA: Diagnosis not present

## 2019-01-06 DIAGNOSIS — N814 Uterovaginal prolapse, unspecified: Secondary | ICD-10-CM

## 2019-01-06 DIAGNOSIS — N8111 Cystocele, midline: Secondary | ICD-10-CM | POA: Diagnosis not present

## 2019-01-06 DIAGNOSIS — N952 Postmenopausal atrophic vaginitis: Secondary | ICD-10-CM

## 2019-01-14 ENCOUNTER — Telehealth: Payer: Self-pay | Admitting: Obstetrics and Gynecology

## 2019-01-14 NOTE — Telephone Encounter (Signed)
Patient scheduled a follow up appointment with Dr.Jertson 01/20/19 at 4:00pm. She is asking to talk with a nurse before coming to this appointment. She wants to make sure Dr.Jertson thinks she needs this follow up appointment.

## 2019-01-15 NOTE — Telephone Encounter (Signed)
Spoke with patient. Patient denies any new concerns, declines referral at this time. Patient states she will further discuss next steps in care at [facility] as scheduled. Patient thankful for call.   Routing to provider for final review. Patient is agreeable to disposition. Will close encounter.

## 2019-01-15 NOTE — Telephone Encounter (Signed)
She still needs a plan of care. I was unable to find a pessary to hold her prolapse, but she is not emptying her bladder without the pessary. We discussed surgery and she wanted to think about things. I think she should come in. If she would like another opinion, I would be happy to send her to a Uro-gynecologist.

## 2019-01-15 NOTE — Telephone Encounter (Signed)
Patient was seen in office on 8/3 for f/u on cystocele. Unable to find pessary to hold prolapse.   Dr. Talbert Nan - is f/u OV still needed on 8/14?

## 2019-01-17 ENCOUNTER — Ambulatory Visit: Payer: Medicare Other

## 2019-01-20 ENCOUNTER — Ambulatory Visit (INDEPENDENT_AMBULATORY_CARE_PROVIDER_SITE_OTHER): Payer: Medicare Other | Admitting: Obstetrics and Gynecology

## 2019-01-20 ENCOUNTER — Other Ambulatory Visit: Payer: Self-pay

## 2019-01-20 ENCOUNTER — Encounter: Payer: Self-pay | Admitting: Obstetrics and Gynecology

## 2019-01-20 ENCOUNTER — Telehealth: Payer: Self-pay | Admitting: Internal Medicine

## 2019-01-20 ENCOUNTER — Ambulatory Visit (INDEPENDENT_AMBULATORY_CARE_PROVIDER_SITE_OTHER): Payer: Medicare Other

## 2019-01-20 VITALS — BP 118/80 | HR 80 | Temp 98.0°F | Wt 167.0 lb

## 2019-01-20 DIAGNOSIS — N8111 Cystocele, midline: Secondary | ICD-10-CM

## 2019-01-20 DIAGNOSIS — N814 Uterovaginal prolapse, unspecified: Secondary | ICD-10-CM | POA: Diagnosis not present

## 2019-01-20 DIAGNOSIS — E538 Deficiency of other specified B group vitamins: Secondary | ICD-10-CM | POA: Diagnosis not present

## 2019-01-20 MED ORDER — CYANOCOBALAMIN 1000 MCG/ML IJ SOLN
1000.0000 ug | Freq: Once | INTRAMUSCULAR | Status: AC
  Administered 2019-01-20: 17:00:00 1000 ug via INTRAMUSCULAR

## 2019-01-20 NOTE — Progress Notes (Signed)
GYNECOLOGY  VISIT   HPI: 85 y.o.   Widowed White or Caucasian Not Hispanic or Latino  female   [phone] with No LMP recorded. Patient is postmenopausal.   here to discuss surgery.  The patient has a very large grade 3 cystocele and grade 2 uterine prolapse with urinary retention and overflow and urge incontinence. At her initial visit in 7/20 she had a PVR of 280 cc. We have tried multiple pessaries (ring, gelhorn and cube pessaries) in the last 6 weeks without continued success. Several of the pessaries worked for a few days, then she would start prolapsing in front of them. The largest of the pessaries are too uncomfortable.  With the pessary in her urinary flow is much better and her incontinence is much improved. She denies GSI with the pessary in. She had normal renal function in 6/20.  She has started taking miralax daily, having a BM ~q 3 days, minimal straining.   GYNECOLOGIC HISTORY: No LMP recorded. Patient is postmenopausal. Contraception: Postmenopausal Menopausal hormone therapy: None        OB History    Gravida  1   Para  1   Term  1   Preterm      AB      Living  2     SAB      TAB      Ectopic      Multiple  1   Live Births  2              Patient Active Problem List   Diagnosis Date Noted  . Vaginal prolapse 11/13/2018  . Stasis dermatitis of both legs 02/18/2018  . Vitamin B12 deficiency 11/30/2017  . Glaucoma 07/31/2017  . Hearing loss 07/31/2017  . Gait disorder 07/31/2017  . Anxiety 02/01/2017  . Melena 05/05/2015  . Atrial fibrillation ([place]) 03/29/2015  . Elevated hemoglobin ([place]) 11/28/2014  . Pain in joint, ankle and foot 04/28/2014  . PVC's (premature ventricular contractions) 03/02/2014  . Abnormal echocardiogram 02/13/2014  . Chest pain, unspecified 01/30/2014  . Abdominal pain, chronic, epigastric 01/30/2014  . Tachycardia 01/30/2014  . Knee pain 12/18/2013  . Paresthesia 06/25/2013  . Obesity 12/11/2012  . Elevated glucose  12/11/2012  . URI (upper respiratory infection) 09/09/2012  . Vertigo 03/11/2012  . DOE (dyspnea on exertion) 01/26/2012  . Neoplasm of uncertain behavior of skin 04/20/2011  . Ganglion cyst 12/22/2010  . Venous insufficiency of leg 09/21/2010  . Edema 09/21/2010  . SKIN RASH 09/07/2009  . FATTY LIVER DISEASE 12/22/2008  . OSTEOARTHRITIS 12/22/2008  . Cholelithiasis with chronic cholecystitis 07/01/2008  . Cystitis 02/27/2008  . Fatigue 02/27/2008  . ABNORMAL LIVER FUNCTION TESTS 02/27/2008  . Dyslipidemia 08/29/2007  . Disturbance of skin sensation 08/22/2007  . DEFICIENCY, VITAMIN D NOS 02/27/2007  . Essential hypertension 02/27/2007  . LOW BACK PAIN 02/27/2007  Recent glucose and LFT's from earlier this year were normal.   Past Medical History:  Diagnosis Date  . Dislocated shoulder 2012   right  . Fatty liver   . Gallstone    1.5 cm  . HTN (hypertension)   . Hyperlipidemia   . LBP (low back pain)   . Osteoarthritis   . Rosacea   . Shortness of breath    with activities and exertion    Past Surgical History:  Procedure Laterality Date  . CHOLECYSTECTOMY N/A 03/27/2014   Procedure: LAPAROSCOPIC CHOLECYSTECTOMY WITH INTRAOPERATIVE CHOLANGIOGRAM;  Surgeon: Fanny Skates, MD;  Location: [facility];  Service: General;  Laterality: N/A;  . TONSILLECTOMY      Current Outpatient Medications  Medication Sig Dispense Refill  . aspirin 81 MG tablet Take 1 tablet (81 mg total) daily by mouth. 30 tablet 11  . bumetanide (BUMEX) 0.5 MG tablet Take 1 tablet (0.5 mg total) by mouth daily with breakfast. 90 tablet 3  . Cod Liver Oil 1000 MG CAPS Take 1 capsule by mouth 2 (two) times daily.    . Coenzyme Q10 (CO Q 10 PO) Take 1 capsule by mouth daily.    [name] CRANBERRY PO Take by mouth.    . docusate sodium (COLACE) 100 MG capsule Take 100 mg by mouth 2 (two) times daily.    [name] FLAXSEED, LINSEED, PO Take by mouth daily. PATIENT SPRINKLE FLAX SEEDS ONTO HER CEREAL DAILY    . GARLIC PO  Take by mouth.    . Multiple Minerals (MULTI-MINERALS PO) Take 1 tablet by mouth daily.    . polyethylene glycol (MIRALAX / GLYCOLAX) 17 g packet Take 17 g by mouth daily.    . potassium chloride (K-DUR) 10 MEQ tablet Take 1 tablet (10 mEq total) by mouth daily as needed (take with furosemide). 90 tablet 3  . torsemide (DEMADEX) 20 MG tablet Take 1 tablet (20 mg total) by mouth daily. 90 tablet 3  . vitamin C (ASCORBIC ACID) 500 MG tablet Take 500 mg by mouth daily.    . Zinc Sulfate (ZINC 15 PO) Take by mouth.     No current facility-administered medications for this visit.      ALLERGIES: Eliquis [apixaban], Losartan, Maxzide [hydrochlorothiazide w-triamterene], and Metoprolol  Family History  Problem Relation Age of Onset  . Pancreatic cancer Mother   . Hypertension Mother   . Breast cancer Mother     Social History   Socioeconomic History  . Marital status: Widowed    Spouse name: Not on file  . Number of children: Not on file  . Years of education: Not on file  . Highest education level: Not on file  Occupational History  . Occupation: Retired  Scientific laboratory technician  . Financial resource strain: Not on file  . Food insecurity    Worry: Not on file    Inability: Not on file  . Transportation needs    Medical: Not on file    Non-medical: Not on file  Tobacco Use  . Smoking status: Never Smoker  . Smokeless tobacco: Never Used  Substance and Sexual Activity  . Alcohol use: No  . Drug use: No  . Sexual activity: Not Currently    Birth control/protection: Post-menopausal  Lifestyle  . Physical activity    Days per week: Not on file    Minutes per session: Not on file  . Stress: Not on file  Relationships  . Social Herbalist on phone: Not on file    Gets together: Not on file    Attends religious service: Not on file    Active member of club or organization: Not on file    Attends meetings of clubs or organizations: Not on file    Relationship status: Not on  file  . Intimate partner violence    Fear of current or ex partner: Not on file    Emotionally abused: Not on file    Physically abused: Not on file    Forced sexual activity: Not on file  Other Topics Concern  . Not on file  Social History Narrative  Not taking vaccines          Review of Systems  Constitutional: Negative.   HENT: Negative.   Eyes: Negative.   Respiratory: Negative.   Cardiovascular: Negative.   Gastrointestinal: Negative.   Genitourinary:       Bladder prolapse  Musculoskeletal: Negative.   Skin: Negative.   Neurological: Negative.   Endo/Heme/Allergies: Negative.   Psychiatric/Behavioral: Negative.     PHYSICAL EXAMINATION:    BP 118/80 (BP Location: Right Arm, Patient Position: Sitting, Cuff Size: Normal)   Pulse 80   Temp 98 F (36.7 C) (Skin)   Wt 167 lb (75.8 kg)   BMI 31.55 kg/m     General appearance: alert, cooperative and appears stated age Neck: no adenopathy, supple, symmetrical, trachea midline and thyroid normal to inspection and palpation Heart: regular rate and rhythm Lungs: CTAB Abdomen: soft, non-tender; bowel sounds normal; no masses,  no organomegaly Extremities: normal, atraumatic, no cyanosis Skin: normal color, texture and turgor, no rashes or lesions Lymph: normal cervical supraclavicular and inguinal nodes Neurologic: grossly normal  Pelvic: deferred. Exam earlier this month with large grade 3 cystocele and grade 2 uterine prolapse. No significant rectocele Chaperone was present for exam.  ASSESSMENT Large grade 3 cystocele and grade 2 uterine prolapse with resultant urinary retention and overflow/urge incontinence. Recent normal creatinine.  Unable to fit with a pessary, she needs surgery to prevent kidney damage from the urinary retention.  H/O A.fib, on review of the chart there is mention of "low burden of atrial flutter and period of atrial fibrillation" on cardiac telemetry om 2016. At that point she was on  anticoagulation She is not currently on anticoagulation other than Asprin.  HTN Elevated lipids Lower extremity venous insufficiency     PLAN Exam under anesthesia. If she has more prolapse of the posterior vagina under anesthesia will perform colpocleisis (close the vagina) if possible. If not possible to perform colpocleisis, will proceed with total vaginal hysterectomy, possible removal of both tubes and ovaries, uterosacral ligament suspension, cystocele repair and cystoscopy.  The patient understands the risk of recurrent prolapse of up to 30% (much less with colpocleisis) The patient understands that she will not be able to be sexually active if colpocleisis is performed. We discussed risks of surgery, including but not limited to: bleeding, infection, damage to nearby organs (bowel, bladder, blood vessels and ureters), need for laparoscopy or laparotomy, blood clots and MI.  Will need to get medical clearance and guidance about her h/o a.fib from Dr Alain Marion.  Will have her hold her ASA 5 days pre-op and stop all supplements a week prior to surgery.    An After Visit Summary was printed and given to the patient.  ~20 minutes face to face time of which over 50% was spent in counseling.

## 2019-01-20 NOTE — Progress Notes (Signed)
Appointment for surgical clearance scheduled with Dr Alain Marion for Thursday, 01-23-19 at 3:20 pm.

## 2019-01-20 NOTE — Telephone Encounter (Signed)
error 

## 2019-01-20 NOTE — H&P (View-Only) (Signed)
GYNECOLOGY  VISIT   HPI: 85 y.o.   Widowed White or Caucasian Not Hispanic or Latino  female   [phone] with No LMP recorded. Patient is postmenopausal.   here to discuss surgery.  The patient has a very large grade 3 cystocele and grade 2 uterine prolapse with urinary retention and overflow and urge incontinence. At her initial visit in 7/20 she had a PVR of 280 cc. We have tried multiple pessaries (ring, gelhorn and cube pessaries) in the last 6 weeks without continued success. Several of the pessaries worked for a few days, then she would start prolapsing in front of them. The largest of the pessaries are too uncomfortable.  With the pessary in her urinary flow is much better and her incontinence is much improved. She denies GSI with the pessary in. She had normal renal function in 6/20.  She has started taking miralax daily, having a BM ~q 3 days, minimal straining.   GYNECOLOGIC HISTORY: No LMP recorded. Patient is postmenopausal. Contraception: Postmenopausal Menopausal hormone therapy: None        OB History    Gravida  1   Para  1   Term  1   Preterm      AB      Living  2     SAB      TAB      Ectopic      Multiple  1   Live Births  2              Patient Active Problem List   Diagnosis Date Noted  . Vaginal prolapse 11/13/2018  . Stasis dermatitis of both legs 02/18/2018  . Vitamin B12 deficiency 11/30/2017  . Glaucoma 07/31/2017  . Hearing loss 07/31/2017  . Gait disorder 07/31/2017  . Anxiety 02/01/2017  . Melena 05/05/2015  . Atrial fibrillation ([place]) 03/29/2015  . Elevated hemoglobin ([place]) 11/28/2014  . Pain in joint, ankle and foot 04/28/2014  . PVC's (premature ventricular contractions) 03/02/2014  . Abnormal echocardiogram 02/13/2014  . Chest pain, unspecified 01/30/2014  . Abdominal pain, chronic, epigastric 01/30/2014  . Tachycardia 01/30/2014  . Knee pain 12/18/2013  . Paresthesia 06/25/2013  . Obesity 12/11/2012  . Elevated glucose  12/11/2012  . URI (upper respiratory infection) 09/09/2012  . Vertigo 03/11/2012  . DOE (dyspnea on exertion) 01/26/2012  . Neoplasm of uncertain behavior of skin 04/20/2011  . Ganglion cyst 12/22/2010  . Venous insufficiency of leg 09/21/2010  . Edema 09/21/2010  . SKIN RASH 09/07/2009  . FATTY LIVER DISEASE 12/22/2008  . OSTEOARTHRITIS 12/22/2008  . Cholelithiasis with chronic cholecystitis 07/01/2008  . Cystitis 02/27/2008  . Fatigue 02/27/2008  . ABNORMAL LIVER FUNCTION TESTS 02/27/2008  . Dyslipidemia 08/29/2007  . Disturbance of skin sensation 08/22/2007  . DEFICIENCY, VITAMIN D NOS 02/27/2007  . Essential hypertension 02/27/2007  . LOW BACK PAIN 02/27/2007  Recent glucose and LFT's from earlier this year were normal.   Past Medical History:  Diagnosis Date  . Dislocated shoulder 2012   right  . Fatty liver   . Gallstone    1.5 cm  . HTN (hypertension)   . Hyperlipidemia   . LBP (low back pain)   . Osteoarthritis   . Rosacea   . Shortness of breath    with activities and exertion    Past Surgical History:  Procedure Laterality Date  . CHOLECYSTECTOMY N/A 03/27/2014   Procedure: LAPAROSCOPIC CHOLECYSTECTOMY WITH INTRAOPERATIVE CHOLANGIOGRAM;  Surgeon: Fanny Skates, MD;  Location: [facility];  Service: General;  Laterality: N/A;  . TONSILLECTOMY      Current Outpatient Medications  Medication Sig Dispense Refill  . aspirin 81 MG tablet Take 1 tablet (81 mg total) daily by mouth. 30 tablet 11  . bumetanide (BUMEX) 0.5 MG tablet Take 1 tablet (0.5 mg total) by mouth daily with breakfast. 90 tablet 3  . Cod Liver Oil 1000 MG CAPS Take 1 capsule by mouth 2 (two) times daily.    . Coenzyme Q10 (CO Q 10 PO) Take 1 capsule by mouth daily.    [name] CRANBERRY PO Take by mouth.    . docusate sodium (COLACE) 100 MG capsule Take 100 mg by mouth 2 (two) times daily.    [name] FLAXSEED, LINSEED, PO Take by mouth daily. PATIENT SPRINKLE FLAX SEEDS ONTO HER CEREAL DAILY    . GARLIC PO  Take by mouth.    . Multiple Minerals (MULTI-MINERALS PO) Take 1 tablet by mouth daily.    . polyethylene glycol (MIRALAX / GLYCOLAX) 17 g packet Take 17 g by mouth daily.    . potassium chloride (K-DUR) 10 MEQ tablet Take 1 tablet (10 mEq total) by mouth daily as needed (take with furosemide). 90 tablet 3  . torsemide (DEMADEX) 20 MG tablet Take 1 tablet (20 mg total) by mouth daily. 90 tablet 3  . vitamin C (ASCORBIC ACID) 500 MG tablet Take 500 mg by mouth daily.    . Zinc Sulfate (ZINC 15 PO) Take by mouth.     No current facility-administered medications for this visit.      ALLERGIES: Eliquis [apixaban], Losartan, Maxzide [hydrochlorothiazide w-triamterene], and Metoprolol  Family History  Problem Relation Age of Onset  . Pancreatic cancer Mother   . Hypertension Mother   . Breast cancer Mother     Social History   Socioeconomic History  . Marital status: Widowed    Spouse name: Not on file  . Number of children: Not on file  . Years of education: Not on file  . Highest education level: Not on file  Occupational History  . Occupation: Retired  Scientific laboratory technician  . Financial resource strain: Not on file  . Food insecurity    Worry: Not on file    Inability: Not on file  . Transportation needs    Medical: Not on file    Non-medical: Not on file  Tobacco Use  . Smoking status: Never Smoker  . Smokeless tobacco: Never Used  Substance and Sexual Activity  . Alcohol use: No  . Drug use: No  . Sexual activity: Not Currently    Birth control/protection: Post-menopausal  Lifestyle  . Physical activity    Days per week: Not on file    Minutes per session: Not on file  . Stress: Not on file  Relationships  . Social Herbalist on phone: Not on file    Gets together: Not on file    Attends religious service: Not on file    Active member of club or organization: Not on file    Attends meetings of clubs or organizations: Not on file    Relationship status: Not on  file  . Intimate partner violence    Fear of current or ex partner: Not on file    Emotionally abused: Not on file    Physically abused: Not on file    Forced sexual activity: Not on file  Other Topics Concern  . Not on file  Social History Narrative  Not taking vaccines          Review of Systems  Constitutional: Negative.   HENT: Negative.   Eyes: Negative.   Respiratory: Negative.   Cardiovascular: Negative.   Gastrointestinal: Negative.   Genitourinary:       Bladder prolapse  Musculoskeletal: Negative.   Skin: Negative.   Neurological: Negative.   Endo/Heme/Allergies: Negative.   Psychiatric/Behavioral: Negative.     PHYSICAL EXAMINATION:    BP 118/80 (BP Location: Right Arm, Patient Position: Sitting, Cuff Size: Normal)   Pulse 80   Temp 98 F (36.7 C) (Skin)   Wt 167 lb (75.8 kg)   BMI 31.55 kg/m     General appearance: alert, cooperative and appears stated age Neck: no adenopathy, supple, symmetrical, trachea midline and thyroid normal to inspection and palpation Heart: regular rate and rhythm Lungs: CTAB Abdomen: soft, non-tender; bowel sounds normal; no masses,  no organomegaly Extremities: normal, atraumatic, no cyanosis Skin: normal color, texture and turgor, no rashes or lesions Lymph: normal cervical supraclavicular and inguinal nodes Neurologic: grossly normal  Pelvic: deferred. Exam earlier this month with large grade 3 cystocele and grade 2 uterine prolapse. No significant rectocele Chaperone was present for exam.  ASSESSMENT Large grade 3 cystocele and grade 2 uterine prolapse with resultant urinary retention and overflow/urge incontinence. Recent normal creatinine.  Unable to fit with a pessary, she needs surgery to prevent kidney damage from the urinary retention.  H/O A.fib, on review of the chart there is mention of "low burden of atrial flutter and period of atrial fibrillation" on cardiac telemetry om 2016. At that point she was on  anticoagulation She is not currently on anticoagulation other than Asprin.  HTN Elevated lipids Lower extremity venous insufficiency     PLAN Exam under anesthesia. If she has more prolapse of the posterior vagina under anesthesia will perform colpocleisis (close the vagina) if possible. If not possible to perform colpocleisis, will proceed with total vaginal hysterectomy, possible removal of both tubes and ovaries, uterosacral ligament suspension, cystocele repair and cystoscopy.  The patient understands the risk of recurrent prolapse of up to 30% (much less with colpocleisis) The patient understands that she will not be able to be sexually active if colpocleisis is performed. We discussed risks of surgery, including but not limited to: bleeding, infection, damage to nearby organs (bowel, bladder, blood vessels and ureters), need for laparoscopy or laparotomy, blood clots and MI.  Will need to get medical clearance and guidance about her h/o a.fib from Dr Alain Marion.  Will have her hold her ASA 5 days pre-op and stop all supplements a week prior to surgery.    An After Visit Summary was printed and given to the patient.  ~20 minutes face to face time of which over 50% was spent in counseling.

## 2019-01-21 ENCOUNTER — Encounter: Payer: Self-pay | Admitting: Obstetrics and Gynecology

## 2019-01-21 ENCOUNTER — Telehealth: Payer: Self-pay | Admitting: *Deleted

## 2019-01-21 ENCOUNTER — Other Ambulatory Visit: Payer: Self-pay | Admitting: *Deleted

## 2019-01-21 DIAGNOSIS — Z01818 Encounter for other preprocedural examination: Secondary | ICD-10-CM

## 2019-01-21 NOTE — Progress Notes (Signed)
GYNECOLOGY  VISIT   HPI: 85 y.o.   Widowed White or Caucasian Not Hispanic or Latino  female   [phone] with No LMP recorded. Patient is postmenopausal.   here for consult following PUS.  The patient is scheduled for surgery next week, possible colpocleisis, or TVH/BSO/cystocele repair. Needs u/s to check stripe if she is going to have a colpocleisis.   GYNECOLOGIC HISTORY: No LMP recorded. Patient is postmenopausal. Contraception: Postmenopausal Menopausal hormone therapy:         OB History    Gravida  1   Para  1   Term  1   Preterm      AB      Living  2     SAB      TAB      Ectopic      Multiple  1   Live Births  2              Patient Active Problem List   Diagnosis Date Noted  . Vaginal prolapse 11/13/2018  . Stasis dermatitis of both legs 02/18/2018  . Vitamin B12 deficiency 11/30/2017  . Glaucoma 07/31/2017  . Hearing loss 07/31/2017  . Gait disorder 07/31/2017  . Anxiety 02/01/2017  . Melena 05/05/2015  . Atrial fibrillation ([place]) 03/29/2015  . Elevated hemoglobin ([place]) 11/28/2014  . Pain in joint, ankle and foot 04/28/2014  . PVC's (premature ventricular contractions) 03/02/2014  . Abnormal echocardiogram 02/13/2014  . Chest pain, unspecified 01/30/2014  . Abdominal pain, chronic, epigastric 01/30/2014  . Tachycardia 01/30/2014  . Knee pain 12/18/2013  . Paresthesia 06/25/2013  . Obesity 12/11/2012  . Elevated glucose 12/11/2012  . URI (upper respiratory infection) 09/09/2012  . Vertigo 03/11/2012  . DOE (dyspnea on exertion) 01/26/2012  . Neoplasm of uncertain behavior of skin 04/20/2011  . Ganglion cyst 12/22/2010  . Venous insufficiency of leg 09/21/2010  . Edema 09/21/2010  . SKIN RASH 09/07/2009  . FATTY LIVER DISEASE 12/22/2008  . OSTEOARTHRITIS 12/22/2008  . Cholelithiasis with chronic cholecystitis 07/01/2008  . Cystitis 02/27/2008  . Fatigue 02/27/2008  . ABNORMAL LIVER FUNCTION TESTS 02/27/2008  . Dyslipidemia  08/29/2007  . Disturbance of skin sensation 08/22/2007  . DEFICIENCY, VITAMIN D NOS 02/27/2007  . Essential hypertension 02/27/2007  . LOW BACK PAIN 02/27/2007    Past Medical History:  Diagnosis Date  . Anxiety   . Atrial fibrillation ([place])   . Cystocele with prolapse   . Dislocated shoulder 2012   right  . Edema    left leg  . Fatty liver   . Gait disturbance   . Gallstone    1.5 cm  . Ganglion cyst   . Glaucoma   . HTN (hypertension)   . Hyperlipidemia   . LBP (low back pain)   . Obesity   . Osteoarthritis   . Rosacea   . Shortness of breath    with activities and exertion, pt denies 01/23/2019  . Venous insufficiency of leg   . Vertigo    not current 01/23/2019  . Vitamin B 12 deficiency     Past Surgical History:  Procedure Laterality Date  . CHOLECYSTECTOMY N/A 03/27/2014   Procedure: LAPAROSCOPIC CHOLECYSTECTOMY WITH INTRAOPERATIVE CHOLANGIOGRAM;  Surgeon: Fanny Skates, MD;  Location: [facility];  Service: General;  Laterality: N/A;  . TONSILLECTOMY      Current Outpatient Medications  Medication Sig Dispense Refill  . Ascorbic Acid (VITAMIN C) 1000 MG tablet Take 1,000 mg by mouth daily.     [name]  aspirin 81 MG tablet Take 1 tablet (81 mg total) daily by mouth. 30 tablet 11  . bumetanide (BUMEX) 0.5 MG tablet Take 1 tablet (0.5 mg total) by mouth daily with breakfast. 90 tablet 3  . Cod Liver Oil 1000 MG CAPS Take 1,000 mg by mouth daily.     . Coenzyme Q10 (CO Q 10 PO) Take 250 mg by mouth daily.     [name] CRANBERRY PO Take 1 capsule by mouth daily.     [name] docusate sodium (COLACE) 100 MG capsule Take 100 mg by mouth daily.     [name] FLAXSEED, LINSEED, PO Take 0.5 Scoops by mouth daily. PATIENT SPRINKLE FLAX SEEDS ONTO HER CEREAL DAILY     . Garlic 962 MG CAPS Take 500 mg by mouth daily.     . Multiple Minerals (MULTI-MINERALS PO) Take 1 tablet by mouth daily.    . potassium chloride (K-DUR) 10 MEQ tablet Take 1 tablet (10 mEq total) by mouth daily as needed (take with  furosemide). 90 tablet 3  . torsemide (DEMADEX) 20 MG tablet Take 1 tablet (20 mg total) by mouth daily. 90 tablet 3  . Zinc Sulfate (ZINC 15 PO) Take 50 mg by mouth daily.     . polyethylene glycol (MIRALAX / GLYCOLAX) 17 g packet Take 17 g by mouth daily.     No current facility-administered medications for this visit.      ALLERGIES: Eliquis [apixaban], Losartan, Maxzide [hydrochlorothiazide w-triamterene], and Metoprolol  Family History  Problem Relation Age of Onset  . Pancreatic cancer Mother   . Hypertension Mother   . Breast cancer Mother     Social History   Socioeconomic History  . Marital status: Widowed    Spouse name: Not on file  . Number of children: Not on file  . Years of education: Not on file  . Highest education level: Not on file  Occupational History  . Occupation: Retired  Scientific laboratory technician  . Financial resource strain: Not on file  . Food insecurity    Worry: Not on file    Inability: Not on file  . Transportation needs    Medical: Not on file    Non-medical: Not on file  Tobacco Use  . Smoking status: Never Smoker  . Smokeless tobacco: Never Used  Substance and Sexual Activity  . Alcohol use: No  . Drug use: Never  . Sexual activity: Not Currently    Birth control/protection: Post-menopausal  Lifestyle  . Physical activity    Days per week: Not on file    Minutes per session: Not on file  . Stress: Not on file  Relationships  . Social Herbalist on phone: Not on file    Gets together: Not on file    Attends religious service: Not on file    Active member of club or organization: Not on file    Attends meetings of clubs or organizations: Not on file    Relationship status: Not on file  . Intimate partner violence    Fear of current or ex partner: Not on file    Emotionally abused: Not on file    Physically abused: Not on file    Forced sexual activity: Not on file  Other Topics Concern  . Not on file  Social History Narrative    Not taking vaccines          Review of Systems  Constitutional: Negative.   HENT: Negative.   Eyes: Negative.  Respiratory: Negative.   Cardiovascular: Negative.   Gastrointestinal: Negative.   Genitourinary: Negative.   Musculoskeletal: Negative.   Skin: Negative.   Neurological: Negative.   Endo/Heme/Allergies: Negative.   Psychiatric/Behavioral: Negative.     PHYSICAL EXAMINATION:    BP 124/78 (BP Location: Right Arm, Patient Position: Sitting, Cuff Size: Normal)   Pulse 80   Temp 98.1 F (36.7 C) (Skin)   Wt 162 lb 9.6 oz (73.8 kg)   BMI (P) 31.76 kg/m     General appearance: alert, cooperative and appears stated age  Pelvic: External genitalia:  no lesions              Urethra:  normal appearing urethra with no masses, tenderness or lesions              Bartholins and Skenes: normal                 Vagina: large grade 3 cystocele, grade 2 uterine prolapse with valsalva.              Cervix: no lesions  Sonohysterogram The procedure and risks of the procedure were reviewed with the patient, consent form was signed. A speculum was placed in the vagina and the cervix was cleansed with Hibiclens. A tenaculum was placed on the cervix and the cervix was dilated with the mini-dilators.  The sonohysterogram catheter was inserted into the uterine cavity. Saline was infused under direct observation with the ultrasound. The lining is slightly thickened and there is a 5 mm intracavitary defect.The catheter was removed.     Chaperone was present for exam.  ASSESSMENT Grade 3 cystocele and grade 2 uterine prolapse, evaluation of endometrium done today prior to possible colpocleisis (would only be possible if she has more posterior vaginal prolapse under anesthesia). Lining is slightly thickened.      PLAN Reviewed with the patient, would not want to do a colpocleisis without further evaluating her endometrium. Given the low likelihood of being able to complete  colpocleisis, will proceed with TVH, possible BSO, cystocele repair and cystoscopy. Patient and her son understand and are agreeable.    An After Visit Summary was printed and given to the patient.

## 2019-01-21 NOTE — H&P (Signed)
Office Visit  01/20/2019 [facility]   Talbert Nan, Francesca Jewett, MD Obstetrics and Gynecology  Midline cystocele +1 more Dx  Advice Only   ; Referred by Plotnikov, Evie Lacks, MD Reason for Visit  Additional Documentation  Vitals:   BP 118/80 (BP Location: Right Arm, Patient Position: Sitting, Cuff Size: Normal)  Pulse 80  Temp 98 F (36.7 C) (Skin)  Wt 75.8 kg  BMI 31.55 kg/m  BSA 1.81 m    More Vitals  Flowsheets:   MEWS Score,  Anthropometrics,  Method of Visit    Encounter Info:   Billing Info,  History,  Allergies,  Detailed Report    All Notes  Progress Notes by Huey Romans, RN at 01/20/2019 4:00 PM Author: Huey Romans, RN Author Type: Registered Nurse Filed: 01/21/2019 11:29 AM  Note Status: Signed Cosign: Cosign Not Required Encounter Date: 01/20/2019  Editor: Huey Romans, RN (Registered Nurse)    Appointment for surgical clearance scheduled with Dr Alain Marion for Thursday, 01-23-19 at 3:20 pm.     Progress Notes by [name], MD at 01/20/2019 4:00 PM Author: [name], MD Author Type: Physician Filed: 01/21/2019 11:29 AM  Note Status: Signed Cosign: Cosign Not Required Encounter Date: 01/20/2019  Editor: [name], MD (Physician)  Prior Versions: 1. Sprague, Harley Hallmark, RN (Registered Nurse) at 01/20/2019 3:58 PM - Sign when Signing Visit    GYNECOLOGY  VISIT   HPI: 85 y.o.   Widowed White or Caucasian Not Hispanic or Latino  female   [phone] with No LMP recorded. Patient is postmenopausal.   here to discuss surgery.  The patient has a very large grade 3 cystocele and grade 2 uterine prolapse with urinary retention and overflow and urge incontinence. At her initial visit in 7/20 she had a PVR of 280 cc. We have tried multiple pessaries (ring, gelhorn and cube pessaries) in the last 6 weeks without continued success. Several of the pessaries worked for a few days, then she would start prolapsing in  front of them. The largest of the pessaries are too uncomfortable.  With the pessary in her urinary flow is much better and her incontinence is much improved. She denies GSI with the pessary in. She had normal renal function in 6/20.  She has started taking miralax daily, having a BM ~q 3 days, minimal straining.   GYNECOLOGIC HISTORY: No LMP recorded. Patient is postmenopausal. Contraception: Postmenopausal Menopausal hormone therapy: None                OB History    Gravida  1   Para  1   Term  1   Preterm      AB      Living  2     SAB      TAB      Ectopic      Multiple  1   Live Births  2                  Patient Active Problem List   Diagnosis Date Noted  . Vaginal prolapse 11/13/2018  . Stasis dermatitis of both legs 02/18/2018  . Vitamin B12 deficiency 11/30/2017  . Glaucoma 07/31/2017  . Hearing loss 07/31/2017  . Gait disorder 07/31/2017  . Anxiety 02/01/2017  . Melena 05/05/2015  . Atrial fibrillation ([place]) 03/29/2015  . Elevated hemoglobin ([place]) 11/28/2014  . Pain in joint, ankle and foot 04/28/2014  . PVC's (premature ventricular contractions) 03/02/2014  .  Abnormal echocardiogram 02/13/2014  . Chest pain, unspecified 01/30/2014  . Abdominal pain, chronic, epigastric 01/30/2014  . Tachycardia 01/30/2014  . Knee pain 12/18/2013  . Paresthesia 06/25/2013  . Obesity 12/11/2012  . Elevated glucose 12/11/2012  . URI (upper respiratory infection) 09/09/2012  . Vertigo 03/11/2012  . DOE (dyspnea on exertion) 01/26/2012  . Neoplasm of uncertain behavior of skin 04/20/2011  . Ganglion cyst 12/22/2010  . Venous insufficiency of leg 09/21/2010  . Edema 09/21/2010  . SKIN RASH 09/07/2009  . FATTY LIVER DISEASE 12/22/2008  . OSTEOARTHRITIS 12/22/2008  . Cholelithiasis with chronic cholecystitis 07/01/2008  . Cystitis 02/27/2008  . Fatigue 02/27/2008  . ABNORMAL LIVER FUNCTION TESTS 02/27/2008  . Dyslipidemia 08/29/2007  .  Disturbance of skin sensation 08/22/2007  . DEFICIENCY, VITAMIN D NOS 02/27/2007  . Essential hypertension 02/27/2007  . LOW BACK PAIN 02/27/2007  Recent glucose and LFT's from earlier this year were normal.       Past Medical History:  Diagnosis Date  . Dislocated shoulder 2012   right  . Fatty liver   . Gallstone    1.5 cm  . HTN (hypertension)   . Hyperlipidemia   . LBP (low back pain)   . Osteoarthritis   . Rosacea   . Shortness of breath    with activities and exertion         Past Surgical History:  Procedure Laterality Date  . CHOLECYSTECTOMY N/A 03/27/2014   Procedure: LAPAROSCOPIC CHOLECYSTECTOMY WITH INTRAOPERATIVE CHOLANGIOGRAM;  Surgeon: Fanny Skates, MD;  Location: [facility];  Service: General;  Laterality: N/A;  . TONSILLECTOMY            Current Outpatient Medications  Medication Sig Dispense Refill  . aspirin 81 MG tablet Take 1 tablet (81 mg total) daily by mouth. 30 tablet 11  . bumetanide (BUMEX) 0.5 MG tablet Take 1 tablet (0.5 mg total) by mouth daily with breakfast. 90 tablet 3  . Cod Liver Oil 1000 MG CAPS Take 1 capsule by mouth 2 (two) times daily.    . Coenzyme Q10 (CO Q 10 PO) Take 1 capsule by mouth daily.    [name] CRANBERRY PO Take by mouth.    . docusate sodium (COLACE) 100 MG capsule Take 100 mg by mouth 2 (two) times daily.    [name] FLAXSEED, LINSEED, PO Take by mouth daily. PATIENT SPRINKLE FLAX SEEDS ONTO HER CEREAL DAILY    . GARLIC PO Take by mouth.    . Multiple Minerals (MULTI-MINERALS PO) Take 1 tablet by mouth daily.    . polyethylene glycol (MIRALAX / GLYCOLAX) 17 g packet Take 17 g by mouth daily.    . potassium chloride (K-DUR) 10 MEQ tablet Take 1 tablet (10 mEq total) by mouth daily as needed (take with furosemide). 90 tablet 3  . torsemide (DEMADEX) 20 MG tablet Take 1 tablet (20 mg total) by mouth daily. 90 tablet 3  . vitamin C (ASCORBIC ACID) 500 MG tablet Take 500 mg by mouth daily.    . Zinc  Sulfate (ZINC 15 PO) Take by mouth.     No current facility-administered medications for this visit.      ALLERGIES: Eliquis [apixaban], Losartan, Maxzide [hydrochlorothiazide w-triamterene], and Metoprolol       Family History  Problem Relation Age of Onset  . Pancreatic cancer Mother   . Hypertension Mother   . Breast cancer Mother     Social History        Socioeconomic History  . Marital  status: Widowed    Spouse name: Not on file  . Number of children: Not on file  . Years of education: Not on file  . Highest education level: Not on file  Occupational History  . Occupation: Retired  Scientific laboratory technician  . Financial resource strain: Not on file  . Food insecurity    Worry: Not on file    Inability: Not on file  . Transportation needs    Medical: Not on file    Non-medical: Not on file  Tobacco Use  . Smoking status: Never Smoker  . Smokeless tobacco: Never Used  Substance and Sexual Activity  . Alcohol use: No  . Drug use: No  . Sexual activity: Not Currently    Birth control/protection: Post-menopausal  Lifestyle  . Physical activity    Days per week: Not on file    Minutes per session: Not on file  . Stress: Not on file  Relationships  . Social Herbalist on phone: Not on file    Gets together: Not on file    Attends religious service: Not on file    Active member of club or organization: Not on file    Attends meetings of clubs or organizations: Not on file    Relationship status: Not on file  . Intimate partner violence    Fear of current or ex partner: Not on file    Emotionally abused: Not on file    Physically abused: Not on file    Forced sexual activity: Not on file  Other Topics Concern  . Not on file  Social History Narrative   Not taking vaccines          Review of Systems  Constitutional: Negative.   HENT: Negative.   Eyes: Negative.   Respiratory: Negative.    Cardiovascular: Negative.   Gastrointestinal: Negative.   Genitourinary:       Bladder prolapse  Musculoskeletal: Negative.   Skin: Negative.   Neurological: Negative.   Endo/Heme/Allergies: Negative.   Psychiatric/Behavioral: Negative.     PHYSICAL EXAMINATION:    BP 118/80 (BP Location: Right Arm, Patient Position: Sitting, Cuff Size: Normal)   Pulse 80   Temp 98 F (36.7 C) (Skin)   Wt 167 lb (75.8 kg)   BMI 31.55 kg/m     General appearance: alert, cooperative and appears stated age Neck: no adenopathy, supple, symmetrical, trachea midline and thyroid normal to inspection and palpation Heart: regular rate and rhythm Lungs: CTAB Abdomen: soft, non-tender; bowel sounds normal; no masses,  no organomegaly Extremities: normal, atraumatic, no cyanosis Skin: normal color, texture and turgor, no rashes or lesions Lymph: normal cervical supraclavicular and inguinal nodes Neurologic: grossly normal  Pelvic: deferred. Exam earlier this month with large grade 3 cystocele and grade 2 uterine prolapse. No significant rectocele Chaperone was present for exam.  ASSESSMENT Large grade 3 cystocele and grade 2 uterine prolapse with resultant urinary retention and overflow/urge incontinence. Recent normal creatinine.  Unable to fit with a pessary, she needs surgery to prevent kidney damage from the urinary retention.  H/O A.fib, on review of the chart there is mention of "low burden of atrial flutter and period of atrial fibrillation" on cardiac telemetry om 2016. At that point she was on anticoagulation She is not currently on anticoagulation other than Asprin.  HTN Elevated lipids Lower extremity venous insufficiency     PLAN Exam under anesthesia. If she has more prolapse of the posterior vagina under anesthesia will perform  colpocleisis (close the vagina) if possible. If not possible to perform colpocleisis, will proceed with total vaginal hysterectomy, possible removal of both  tubes and ovaries, uterosacral ligament suspension, cystocele repair and cystoscopy.  The patient understands the risk of recurrent prolapse of up to 30% (much less with colpocleisis) The patient understands that she will not be able to be sexually active if colpocleisis is performed. We discussed risks of surgery, including but not limited to: bleeding, infection, damage to nearby organs (bowel, bladder, blood vessels and ureters), need for laparoscopy or laparotomy, blood clots and MI.  Will need to get medical clearance and guidance about her h/o a.fib from Dr Alain Marion.  Will have her hold her ASA 5 days pre-op and stop all supplements a week prior to surgery.    An After Visit Summary was printed and given to the patient.  ~20 minutes face to face time of which over 50% was spent in counseling.

## 2019-01-21 NOTE — Telephone Encounter (Signed)
Call to patient. Reviewed surgery date and instructions. Reviewed Covid testing instructions and surgical clearance appointment with Dr Alain Marion. Advised written copy of all instructions and hospital brochure will be mailed to patient.   Routing to Dr Talbert Nan. Encounter closed.

## 2019-01-21 NOTE — Telephone Encounter (Signed)
Call to patient to review tentative surgery plan date.  Left message to call back.

## 2019-01-22 ENCOUNTER — Encounter (HOSPITAL_COMMUNITY): Payer: Self-pay

## 2019-01-22 NOTE — Patient Instructions (Addendum)
DUE TO COVID-19 ONLY ONE VISITOR IS ALLOWED IN WAITING ROOM (VISITOR WILL HAVE A TEMPERATURE CHECK ON ARRIVAL AND MUST WEAR A FACE MASK THE ENTIRE TIME.)  Your COVID swab testing is scheduled for Friday, Aug. 21, 2020  at 3;25 , You must self quarantine after your testing per handout given to you at the testing site.  ([place], [place], [facility] pre surgical testing line drive Up)  Your procedure is scheduled on: Tuesday, Jan 28, 2019  Report to [place] AT  9:00 A. M.   Call this number if you have problems the morning of surgery:  [phone].   OUR ADDRESS IS [place].  WE ARE LOCATED IN THE [place] [place]                                   [facility].                                     REMEMBER:  DO NOT EAT FOOD AFTER MIDNIGHT .    MAY HAVE LIQUIDS UNTIL 8:00AM DAY OF SURGERY  CLEAR LIQUID DIET  Foods Allowed                                                                     Foods Excluded  Water, Black Coffee and tea, regular and decaf                             liquids that you cannot  Plain Jell-O in any flavor  (No red)                                           see through such as: Fruit ices (not with fruit pulp)                                     milk, soups, orange juice  Iced Popsicles (No red)                                    All solid food Carbonated beverages, regular and diet                                    Apple juices Sports drinks like Gatorade (No red) Lightly seasoned clear broth or consume(fat free) Sugar, honey syrup  Sample Menu Breakfast                                Lunch  Supper Cranberry juice                    Beef broth                            Chicken broth Jell-O                                     Grape juice                           Apple juice Coffee or tea                        Jell-O                                       Popsicle                                                Coffee or tea                        Coffee or tea  BRUSH YOUR TEETH THE MORNING OF SURGERY.  TAKE THESE MEDICATIONS MORNING OF SURGERY WITH A SIP OF WATER: NONE   DO NOT WEAR JEWERLY, MAKE UP, OR NAIL POLISH.  DO NOT WEAR LOTIONS, POWDERS, PERFUMES/COLOGNE OR DEODORANT.  DO NOT SHAVE FOR 24 HOURS PRIOR TO DAY OF SURGERY.  CONTACTS, GLASSES, OR DENTURES MAY NOT BE WORN TO SURGERY.                                    [place] IS NOT RESPONSIBLE  FOR ANY BELONGINGS.                                                                     [facility] Before surgery, you can play an important role.  Because skin is not sterile, your skin needs to be as free of germs as possible.  You can reduce the number of germs on your skin by washing with CHG (chlorahexidine gluconate) soap before surgery.  CHG is an antiseptic cleaner which kills germs and bonds with the skin to continue killing germs even after washing. Please DO NOT use if you have an allergy to CHG or antibacterial soaps.  If your skin becomes reddened/irritated stop using the CHG and inform your nurse when you arrive at Short Stay. Do not shave (including legs and underarms) for at least 48 hours prior to the first CHG shower.  You may shave your face/neck.  Please follow these instructions carefully:  1.  Shower with CHG Soap the night before surgery and the  morning of surgery.  2.  If you choose to wash your  hair, wash your hair first as usual with your normal  shampoo.  3.  After you shampoo, rinse your hair and body thoroughly to remove the shampoo.                             4.  Use CHG as you would any other liquid soap.  You can apply chg directly to the skin and wash.  Gently with a scrungie or clean washcloth.  5.  Apply the CHG Soap to your body ONLY FROM THE NECK DOWN.   Do   not use on face/ open                           Wound or open sores.  Avoid contact with eyes, ears mouth and   genitals (private parts).                       Wash face,  Genitals (private parts) with your normal soap.             6.  Wash thoroughly, paying special attention to the area where your    surgery  will be performed.  7.  Thoroughly rinse your body with warm water from the neck down.  8.  DO NOT shower/wash with your normal soap after using and rinsing off the CHG Soap.                9.  Pat yourself dry with a clean towel.            10.  Wear clean pajamas.            11.  Place clean sheets on your bed the night of your first shower and do not  sleep with pets. Day of Surgery : Do not apply any lotions/deodorants the morning of surgery.  Please wear clean clothes to the hospital/surgery center.  FAILURE TO FOLLOW THESE INSTRUCTIONS MAY RESULT IN THE CANCELLATION OF YOUR SURGERY  PATIENT SIGNATURE_________________________________  NURSE SIGNATURE__________________________________  ________________________________________________________________________  WHAT IS A BLOOD TRANSFUSION? Blood Transfusion Information  A transfusion is the replacement of blood or some of its parts. Blood is made up of multiple cells which provide different functions.  Red blood cells carry oxygen and are used for blood loss replacement.  White blood cells fight against infection.  Platelets control bleeding.  Plasma helps clot blood.  Other blood products are available for specialized needs, such as hemophilia or other clotting disorders. BEFORE THE TRANSFUSION  Who gives blood for transfusions?   Healthy volunteers who are fully evaluated to make sure their blood is safe. This is blood bank blood. Transfusion therapy is the safest it has ever been in the practice of medicine. Before blood is taken from a donor, a complete history is taken to make sure that person has no history of diseases nor engages in risky social behavior (examples are intravenous drug  use or sexual activity with multiple partners). The donor's travel history is screened to minimize risk of transmitting infections, such as malaria. The donated blood is tested for signs of infectious diseases, such as HIV and hepatitis. The blood is then tested to be sure it is compatible with you in order to minimize the chance of a transfusion reaction. If you or a relative donates blood, this is often done in anticipation of surgery and is not appropriate for  emergency situations. It takes many days to process the donated blood. RISKS AND COMPLICATIONS Although transfusion therapy is very safe and saves many lives, the main dangers of transfusion include:   Getting an infectious disease.  Developing a transfusion reaction. This is an allergic reaction to something in the blood you were given. Every precaution is taken to prevent this. The decision to have a blood transfusion has been considered carefully by your caregiver before blood is given. Blood is not given unless the benefits outweigh the risks. AFTER THE TRANSFUSION  Right after receiving a blood transfusion, you will usually feel much better and more energetic. This is especially true if your red blood cells have gotten low (anemic). The transfusion raises the level of the red blood cells which carry oxygen, and this usually causes an energy increase.  The nurse administering the transfusion will monitor you carefully for complications. HOME CARE INSTRUCTIONS  No special instructions are needed after a transfusion. You may find your energy is better. Speak with your caregiver about any limitations on activity for underlying diseases you may have. SEEK MEDICAL CARE IF:   Your condition is not improving after your transfusion.  You develop redness or irritation at the intravenous (IV) site. SEEK IMMEDIATE MEDICAL CARE IF:  Any of the following symptoms occur over the next 12 hours:  Shaking chills.  You have a temperature by mouth  above 102 F (38.9 C), not controlled by medicine.  Chest, back, or muscle pain.  People around you feel you are not acting correctly or are confused.  Shortness of breath or difficulty breathing.  Dizziness and fainting.  You get a rash or develop hives.  You have a decrease in urine output.  Your urine turns a dark color or changes to pink, red, or brown. Any of the following symptoms occur over the next 10 days:  You have a temperature by mouth above 102 F (38.9 C), not controlled by medicine.  Shortness of breath.  Weakness after normal activity.  The white part of the eye turns yellow (jaundice).  You have a decrease in the amount of urine or are urinating less often.  Your urine turns a dark color or changes to pink, red, or brown. Document Released: 05/19/2000 Document Revised: 08/14/2011 Document Reviewed: 01/06/2008 [facility] Patient Information 2014 [place], [place].  _______________________________________________________________________

## 2019-01-23 ENCOUNTER — Other Ambulatory Visit: Payer: Self-pay | Admitting: Obstetrics and Gynecology

## 2019-01-23 ENCOUNTER — Ambulatory Visit (INDEPENDENT_AMBULATORY_CARE_PROVIDER_SITE_OTHER): Payer: Medicare Other | Admitting: Internal Medicine

## 2019-01-23 ENCOUNTER — Other Ambulatory Visit: Payer: Self-pay

## 2019-01-23 ENCOUNTER — Ambulatory Visit (INDEPENDENT_AMBULATORY_CARE_PROVIDER_SITE_OTHER): Payer: Medicare Other

## 2019-01-23 ENCOUNTER — Telehealth: Payer: Self-pay | Admitting: Internal Medicine

## 2019-01-23 ENCOUNTER — Encounter (HOSPITAL_COMMUNITY): Payer: Self-pay

## 2019-01-23 ENCOUNTER — Encounter: Payer: Self-pay | Admitting: Obstetrics and Gynecology

## 2019-01-23 ENCOUNTER — Encounter (HOSPITAL_COMMUNITY)
Admission: RE | Admit: 2019-01-23 | Discharge: 2019-01-23 | Disposition: A | Discharge status: Home / Self Care | Payer: Medicare Other | Source: Ambulatory Visit | Attending: Obstetrics and Gynecology | Admitting: Obstetrics and Gynecology

## 2019-01-23 ENCOUNTER — Encounter: Payer: Self-pay | Admitting: Internal Medicine

## 2019-01-23 ENCOUNTER — Ambulatory Visit: Payer: Medicare Other | Admitting: Obstetrics and Gynecology

## 2019-01-23 VITALS — BP 124/78 | HR 80 | Temp 98.1°F | Wt 162.6 lb

## 2019-01-23 DIAGNOSIS — F419 Anxiety disorder, unspecified: Secondary | ICD-10-CM

## 2019-01-23 DIAGNOSIS — Z20828 Contact with and (suspected) exposure to other viral communicable diseases: Secondary | ICD-10-CM | POA: Diagnosis not present

## 2019-01-23 DIAGNOSIS — N819 Female genital prolapse, unspecified: Secondary | ICD-10-CM | POA: Diagnosis not present

## 2019-01-23 DIAGNOSIS — Z01818 Encounter for other preprocedural examination: Secondary | ICD-10-CM

## 2019-01-23 DIAGNOSIS — R269 Unspecified abnormalities of gait and mobility: Secondary | ICD-10-CM

## 2019-01-23 DIAGNOSIS — M5440 Lumbago with sciatica, unspecified side: Secondary | ICD-10-CM

## 2019-01-23 DIAGNOSIS — N8111 Cystocele, midline: Secondary | ICD-10-CM

## 2019-01-23 DIAGNOSIS — N814 Uterovaginal prolapse, unspecified: Secondary | ICD-10-CM | POA: Diagnosis not present

## 2019-01-23 DIAGNOSIS — R339 Retention of urine, unspecified: Secondary | ICD-10-CM | POA: Diagnosis not present

## 2019-01-23 DIAGNOSIS — I480 Paroxysmal atrial fibrillation: Secondary | ICD-10-CM | POA: Diagnosis not present

## 2019-01-23 DIAGNOSIS — E538 Deficiency of other specified B group vitamins: Secondary | ICD-10-CM | POA: Diagnosis not present

## 2019-01-23 DIAGNOSIS — G8929 Other chronic pain: Secondary | ICD-10-CM

## 2019-01-23 DIAGNOSIS — I10 Essential (primary) hypertension: Secondary | ICD-10-CM

## 2019-01-23 HISTORY — DX: Unspecified abnormalities of gait and mobility: R26.9

## 2019-01-23 HISTORY — DX: Deficiency of other specified B group vitamins: E53.8

## 2019-01-23 HISTORY — DX: Uterovaginal prolapse, unspecified: N81.4

## 2019-01-23 HISTORY — DX: Dizziness and giddiness: R42

## 2019-01-23 HISTORY — DX: Edema, unspecified: R60.9

## 2019-01-23 HISTORY — DX: Venous insufficiency (chronic) (peripheral): I87.2

## 2019-01-23 HISTORY — DX: Unspecified glaucoma: H40.9

## 2019-01-23 HISTORY — DX: Obesity, unspecified: E66.9

## 2019-01-23 HISTORY — DX: Ganglion, unspecified site: M67.40

## 2019-01-23 HISTORY — DX: Unspecified atrial fibrillation: I48.91

## 2019-01-23 HISTORY — DX: Anxiety disorder, unspecified: F41.9

## 2019-01-23 LAB — COMPREHENSIVE METABOLIC PANEL
ALT: 27 U/L (ref 0–44)
AST: 22 U/L (ref 15–41)
Albumin: 3.8 g/dL (ref 3.5–5.0)
Alkaline Phosphatase: 83 U/L (ref 38–126)
Anion gap: 11 (ref 5–15)
BUN: 13 mg/dL (ref 8–23)
CO2: 28 mmol/L (ref 22–32)
Calcium: 9.5 mg/dL (ref 8.9–10.3)
Chloride: 101 mmol/L (ref 98–111)
Creatinine, Ser: 0.67 mg/dL (ref 0.44–1.00)
GFR calc Af Amer: >60 mL/min (ref >60)
GFR calc non Af Amer: >60 mL/min (ref >60)
Glucose, Bld: 104 mg/dL — ABNORMAL HIGH (ref 70–99)
Potassium: 3.5 mmol/L (ref 3.5–5.1)
Sodium: 140 mmol/L (ref 135–145)
Total Bilirubin: 0.3 mg/dL (ref 0.3–1.2)
Total Protein: 7 g/dL (ref 6.5–8.1)

## 2019-01-23 LAB — CBC
HCT: 42.6 % (ref 36.0–46.0)
Hemoglobin: 13.6 g/dL (ref 12.0–15.0)
MCH: 31 pg (ref 26.0–34.0)
MCHC: 31.9 g/dL (ref 30.0–36.0)
MCV: 97 fL (ref 80.0–100.0)
Platelets: 268 10*3/uL (ref 150–400)
RBC: 4.39 MIL/uL (ref 3.87–5.11)
RDW: 13.2 % (ref 11.5–15.5)
WBC: 6.9 10*3/uL (ref 4.0–10.5)
nRBC: 0 % (ref 0.0–0.2)

## 2019-01-23 NOTE — Progress Notes (Signed)
SPOKE W/  Kathleen Mejia     SCREENING SYMPTOMS OF COVID 19:   COUGH--NO  RUNNY NOSE--- NO  SORE THROAT---NO  NASAL CONGESTION----NO  SNEEZING----NO  SHORTNESS OF BREATH---NO  DIFFICULTY BREATHING---NO  TEMP >100.0 -----NO  UNEXPLAINED BODY ACHES------NO  CHILLS -------- NO  HEADACHES ---------NO  LOSS OF SMELL/ TASTE --------NO    HAVE YOU OR ANY FAMILY MEMBER TRAVELLED PAST 14 DAYS OUT OF THE   COUNTY---NO STATE----NO COUNTRY----NO  HAVE YOU OR ANY FAMILY MEMBER BEEN EXPOSED TO ANYONE WITH COVID 19? NO

## 2019-01-23 NOTE — Patient Instructions (Addendum)
Valerian root for anxiety     Mediterranean diet is good for you. ([employer] has a typical Mediterranean cuisine menu) The Mediterranean diet is a way of eating based on the traditional cuisine of countries bordering the [employer]. While there is no single definition of the Mediterranean diet, it is typically high in vegetables, fruits, whole grains, beans, nut and seeds, and olive oil. The main components of Mediterranean diet include: [name] Daily consumption of vegetables, fruits, whole grains and healthy fats  . Weekly intake of fish, poultry, beans and eggs  . Moderate portions of dairy products  . Limited intake of red meat Other important elements of the Mediterranean diet are sharing meals with family and friends, enjoying a glass of red wine and being physically active. Health benefits of a Mediterranean diet: A traditional Mediterranean diet consisting of large quantities of fresh fruits and vegetables, nuts, fish and olive oil-coupled with physical activity-can reduce your risk of serious mental and physical health problems by: Preventing heart disease and strokes. Following a Mediterranean diet limits your intake of refined breads, processed foods, and red meat, and encourages drinking red wine instead of hard liquor-all factors that can help prevent heart disease and stroke. Keeping you agile. If you're an older adult, the nutrients gained with a Mediterranean diet may reduce your risk of developing muscle weakness and other signs of frailty by about 70 percent. Reducing the risk of Alzheimer's. Research suggests that the [place] diet may improve cholesterol, blood sugar levels, and overall blood vessel health, which in turn may reduce your risk of Alzheimer's disease or dementia. Halving the risk of Parkinson's disease. The high levels of antioxidants in the Mediterranean diet can prevent cells from undergoing a damaging process called oxidative stress, thereby cutting  the risk of Parkinson's disease in half. Increasing longevity. By reducing your risk of developing heart disease or cancer with the Mediterranean diet, you're reducing your risk of death at any age by 20%. Protecting against type 2 diabetes. A Mediterranean diet is rich in fiber which digests slowly, prevents huge swings in blood sugar, and can help you maintain a healthy weight.

## 2019-01-23 NOTE — Assessment & Plan Note (Signed)
In NSR now 

## 2019-01-23 NOTE — Progress Notes (Signed)
Subjective:  Patient ID: Kathleen Mejia, female    DOB: [birth year]  Age: 85 y.o. MRN: [id number]  CC: No chief complaint on file.   HPI Kathleen Mejia presents for pre-op clearance Req by Dr Talbert Nan Reason - IM clearance for total vaginal hysterectomy, possible removal of both tubes and ovaries, uterosacral ligament suspension, cystocele repair and cystoscopy on Tue next week Hx: Pt w/HTN, OA, obesity - all stable  Past Medical History:  Diagnosis Date  . Anxiety   . Atrial fibrillation ([place])   . Cystocele with prolapse   . Dislocated shoulder 2012   right  . Edema    left leg  . Fatty liver   . Gait disturbance   . Gallstone    1.5 cm  . Ganglion cyst   . Glaucoma   . HTN (hypertension)   . Hyperlipidemia   . LBP (low back pain)   . Obesity   . Osteoarthritis   . Rosacea   . Shortness of breath    with activities and exertion, pt denies 01/23/2019  . Venous insufficiency of leg   . Vertigo    not current 01/23/2019  . Vitamin B 12 deficiency    Past Surgical History:  Procedure Laterality Date  . CHOLECYSTECTOMY N/A 03/27/2014   Procedure: LAPAROSCOPIC CHOLECYSTECTOMY WITH INTRAOPERATIVE CHOLANGIOGRAM;  Surgeon: Fanny Skates, MD;  Location: [facility];  Service: General;  Laterality: N/A;  . TONSILLECTOMY      reports that she has never smoked. She has never used smokeless tobacco. She reports that she does not drink alcohol or use drugs. family history includes Breast cancer in her mother; Hypertension in her mother; Pancreatic cancer in her mother. Allergies  Allergen Reactions  . Eliquis [Apixaban]     HAs, achy  . Losartan     "all side effects and an article from [employer] re Valsartan recall"  . Maxzide [Hydrochlorothiazide W-Triamterene]     Cramps w/a high dose  . Metoprolol     achy     Outpatient Medications Prior to Visit  Medication Sig Dispense Refill  . Ascorbic Acid (VITAMIN C) 1000 MG tablet Take 1,000 mg by mouth daily.     [name] aspirin 81  MG tablet Take 1 tablet (81 mg total) daily by mouth. 30 tablet 11  . bumetanide (BUMEX) 0.5 MG tablet Take 1 tablet (0.5 mg total) by mouth daily with breakfast. 90 tablet 3  . Cod Liver Oil 1000 MG CAPS Take 1,000 mg by mouth daily.     . Coenzyme Q10 (CO Q 10 PO) Take 250 mg by mouth daily.     [name] CRANBERRY PO Take 1 capsule by mouth daily.     [name] docusate sodium (COLACE) 100 MG capsule Take 100 mg by mouth daily.     [name] FLAXSEED, LINSEED, PO Take 0.5 Scoops by mouth daily. PATIENT SPRINKLE FLAX SEEDS ONTO HER CEREAL DAILY     . Garlic 063 MG CAPS Take 500 mg by mouth daily.     . Multiple Minerals (MULTI-MINERALS PO) Take 1 tablet by mouth daily.    . polyethylene glycol (MIRALAX / GLYCOLAX) 17 g packet Take 17 g by mouth daily.    . potassium chloride (K-DUR) 10 MEQ tablet Take 1 tablet (10 mEq total) by mouth daily as needed (take with furosemide). 90 tablet 3  . torsemide (DEMADEX) 20 MG tablet Take 1 tablet (20 mg total) by mouth daily. 90 tablet 3  . Zinc Sulfate (ZINC 15  PO) Take 50 mg by mouth daily.      No facility-administered medications prior to visit.     ROS: Review of Systems  Constitutional: Negative for activity change, appetite change, chills, fatigue and unexpected weight change.  HENT: Negative for congestion, mouth sores and sinus pressure.   Eyes: Negative for visual disturbance.  Respiratory: Negative for cough and chest tightness.   Gastrointestinal: Negative for abdominal pain and nausea.  Genitourinary: Positive for frequency and urgency. Negative for difficulty urinating and vaginal pain.  Musculoskeletal: Positive for arthralgias and gait problem. Negative for back pain.  Skin: Negative for pallor and rash.  Neurological: Negative for dizziness, tremors, weakness, numbness and headaches.  Psychiatric/Behavioral: Negative for confusion and sleep disturbance. The patient is nervous/anxious.     Objective:  BP (!) 164/88   Pulse 90   Temp 98.2 F (36.8 C)  (Temporal)   Resp 17   Ht 5\' 1"  (1.549 m)   Wt 162 lb 8 oz (73.7 kg)   SpO2 96%   BMI 30.70 kg/m   BP Readings from Last 3 Encounters:  01/23/19 (!) 164/88  01/23/19 124/78  01/20/19 118/80    Wt Readings from Last 3 Encounters:  01/23/19 162 lb 8 oz (73.7 kg)  01/23/19 162 lb 9.6 oz (73.8 kg)  01/23/19 (P) 162 lb 12.8 oz (73.8 kg)    Physical Exam Constitutional:      General: She is not in acute distress.    Appearance: She is well-developed. She is obese.  HENT:     Head: Normocephalic.     Right Ear: External ear normal.     Left Ear: External ear normal.     Nose: Nose normal.  Eyes:     General:        Right eye: No discharge.        Left eye: No discharge.     Conjunctiva/sclera: Conjunctivae normal.     Pupils: Pupils are equal, round, and reactive to light.  Neck:     Musculoskeletal: Normal range of motion and neck supple.     Thyroid: No thyromegaly.     Vascular: No JVD.     Trachea: No tracheal deviation.  Cardiovascular:     Rate and Rhythm: Normal rate and regular rhythm.     Heart sounds: Normal heart sounds.  Pulmonary:     Effort: No respiratory distress.     Breath sounds: No stridor. No wheezing.  Abdominal:     General: Bowel sounds are normal. There is no distension.     Palpations: Abdomen is soft. There is no mass.     Tenderness: There is no abdominal tenderness. There is no guarding or rebound.  Musculoskeletal:        General: No tenderness.  Lymphadenopathy:     Cervical: No cervical adenopathy.  Skin:    Findings: No erythema or rash.  Neurological:     Mental Status: She is oriented to person, place, and time.     Cranial Nerves: No cranial nerve deficit.     Motor: No abnormal muscle tone.     Coordination: Coordination normal.     Gait: Gait abnormal.     Deep Tendon Reflexes: Reflexes normal.  Psychiatric:        Behavior: Behavior normal.        Thought Content: Thought content normal.        Judgment: Judgment  normal.    Walker   Lab Results  Component Value  Date   WBC 6.9 01/23/2019   HGB 13.6 01/23/2019   HCT 42.6 01/23/2019   PLT 268 01/23/2019   GLUCOSE 104 (H) 01/23/2019   CHOL 175 11/27/2014   TRIG 103.0 11/27/2014   HDL 50.40 11/27/2014   LDLDIRECT 139.3 02/21/2008   LDLCALC 104 (H) 11/27/2014   ALT 27 01/23/2019   AST 22 01/23/2019   NA 140 01/23/2019   K 3.5 01/23/2019   CL 101 01/23/2019   CREATININE 0.67 01/23/2019   BUN 13 01/23/2019   CO2 28 01/23/2019   TSH 1.77 07/31/2017   HGBA1C 5.5 07/10/2016   MICROALBUR 0.4 06/25/2008    [place] Sonohysterogram  Result Date: 01/23/2019 SEE PROGRESS NOTE   Assessment & Plan:   There are no diagnoses linked to this encounter.   No orders of the defined types were placed in this encounter.    Follow-up: No follow-ups on file.  Walker Kehr, MD

## 2019-01-23 NOTE — Assessment & Plan Note (Signed)
Valerian root for anxiety 

## 2019-01-23 NOTE — Telephone Encounter (Signed)
Sending over EKG by fax and want Dr Alain Marion to review it, pt is in afib

## 2019-01-23 NOTE — Progress Notes (Signed)
Jessica Zanetto P.A. made aware of EKG 01/23/2019.  She will make contact with PCP.

## 2019-01-24 ENCOUNTER — Telehealth: Payer: Self-pay | Admitting: *Deleted

## 2019-01-24 ENCOUNTER — Other Ambulatory Visit (HOSPITAL_COMMUNITY)
Admission: RE | Admit: 2019-01-24 | Discharge: 2019-01-24 | Disposition: A | Discharge status: Home / Self Care | Payer: Medicare Other | Source: Ambulatory Visit | Attending: Obstetrics and Gynecology | Admitting: Obstetrics and Gynecology

## 2019-01-24 ENCOUNTER — Telehealth: Payer: Self-pay | Admitting: Internal Medicine

## 2019-01-24 DIAGNOSIS — N819 Female genital prolapse, unspecified: Secondary | ICD-10-CM | POA: Diagnosis not present

## 2019-01-24 DIAGNOSIS — R339 Retention of urine, unspecified: Secondary | ICD-10-CM | POA: Diagnosis not present

## 2019-01-24 DIAGNOSIS — Z01818 Encounter for other preprocedural examination: Secondary | ICD-10-CM | POA: Diagnosis not present

## 2019-01-24 DIAGNOSIS — Z20828 Contact with and (suspected) exposure to other viral communicable diseases: Secondary | ICD-10-CM | POA: Diagnosis not present

## 2019-01-24 LAB — SARS CORONAVIRUS 2 (TAT 6-24 HRS): SARS Coronavirus 2: NEGATIVE

## 2019-01-24 LAB — TYPE AND SCREEN
ABO/RH(D): B POS
Antibody Screen: POSITIVE

## 2019-01-24 NOTE — Telephone Encounter (Signed)
Call to patient. Patient reports Dr Alain Marion has cleared her for surgery. She is aware to keep appointment for Covid testing this afternoon.

## 2019-01-24 NOTE — Telephone Encounter (Signed)
EKG fax is in MD's folder for review on Monday. He is out of office 01/24/19.

## 2019-01-24 NOTE — Telephone Encounter (Signed)
Call to Dr Plotnikov's office- left message with [facility].  Need update of surgical clearance.  EKG showed AFib during hospital pre-op.

## 2019-01-24 NOTE — Telephone Encounter (Signed)
Copied from [place]. Topic: General - Other >> Jan 24, 2019 10:49 AM Keene Breath wrote: Reason for CRM: Gay Filler called to  ask the doctor if the patient is cleared for surgery on Tuesday, due to her afib this week.  Please advise and call back to confirm at [phone]

## 2019-01-25 NOTE — Assessment & Plan Note (Signed)
Chronic Bumex, Torsemide

## 2019-01-25 NOTE — Assessment & Plan Note (Signed)
The patient is medically clear for surgery.  Please follow your perioperative protocols.  Thank you!

## 2019-01-25 NOTE — Assessment & Plan Note (Signed)
On vitamin B12 

## 2019-01-25 NOTE — Progress Notes (Signed)
Medical screening examination/treatment/procedure(s) were performed by non-physician practitioner and as supervising physician I was immediately available for consultation/collaboration. I agree with above. Aleksei Plotnikov, MD  

## 2019-01-25 NOTE — Telephone Encounter (Signed)
Done She is clear for surgery Thx

## 2019-01-25 NOTE — Assessment & Plan Note (Signed)
Probable spinal stenosis.  She will continue to use a walker

## 2019-01-25 NOTE — Assessment & Plan Note (Signed)
Multifactorial.  Continue to use a walker

## 2019-01-27 NOTE — Anesthesia Preprocedure Evaluation (Addendum)
Anesthesia Evaluation  Patient identified by MRN, date of birth, ID band Patient awake    Reviewed: Allergy & Precautions, NPO status , Patient's Chart, lab work & pertinent test results  Airway Mallampati: I       Dental  (+) Edentulous Upper   Pulmonary    Pulmonary exam normal        Cardiovascular hypertension,  Rhythm:Irregular Rate:Normal     Neuro/Psych Anxiety negative neurological ROS     GI/Hepatic negative GI ROS, Neg liver ROS,   Endo/Other  negative endocrine ROS  Renal/GU negative Renal ROS     Musculoskeletal   Abdominal (+) + obese,   Peds  Hematology negative hematology ROS (+)   Anesthesia Other Findings 23-Jan-2019 12:59:46 [facility]-PRE ROUTINE RECORD Atrial fibrillation Moderate voltage criteria for LVH, may be normal variant Abnormal ECG Confirmed by Ida Rogue [phone]) on 01/23/2019 5:58:32 PM 39mm/s 33mm/mV 100Hz  9.0.4 12SL 241 HD CID: 76 Referred  Reproductive/Obstetrics                          Anesthesia Physical Anesthesia Plan  ASA: II  Anesthesia Plan: General   Post-op Pain Management:    Induction: Intravenous  PONV Risk Score and Plan: 4 or greater and Ondansetron and Dexamethasone  Airway Management Planned: Oral ETT  Additional Equipment: None  Intra-op Plan:   Post-operative Plan:   Informed Consent: I have reviewed the patients History and Physical, chart, labs and discussed the procedure including the risks, benefits and alternatives for the proposed anesthesia with the patient or authorized representative who has indicated his/her understanding and acceptance.     Dental advisory given  Plan Discussed with:   Anesthesia Plan Comments: (See PAT note 01/23/2019, Konrad Felix, PA-C)       Anesthesia Quick Evaluation                                  Anesthesia Evaluation  Patient identified by MRN, date of  birth, ID band Patient awake    Reviewed: Allergy & Precautions, H&P , NPO status , Patient's Chart, lab work & pertinent test results, reviewed documented beta blocker date and time   Airway Mallampati: II TM Distance: >3 FB Neck ROM: Full    Dental no notable dental hx. (+) Edentulous Upper, Dental Advisory Given   Pulmonary neg pulmonary ROS,  breath sounds clear to auscultation  Pulmonary exam normal       Cardiovascular hypertension, Pt. on medications and Pt. on home beta blockers + Peripheral Vascular Disease Rhythm:Regular Rate:Normal     Neuro/Psych negative neurological ROS  negative psych ROS   GI/Hepatic negative GI ROS, Neg liver ROS,   Endo/Other  negative endocrine ROS  Renal/GU negative Renal ROS  negative genitourinary   Musculoskeletal  (+) Arthritis -, Osteoarthritis,    Abdominal   Peds  Hematology negative hematology ROS (+)   Anesthesia Other Findings   Reproductive/Obstetrics negative OB ROS                         Anesthesia Physical Anesthesia Plan  ASA: II  Anesthesia Plan: General   Post-op Pain Management:    Induction: Intravenous  Airway Management Planned: Oral ETT  Additional Equipment:   Intra-op Plan:   Post-operative Plan: Extubation in OR  Informed Consent: I have reviewed the patients History and Physical,  chart, labs and discussed the procedure including the risks, benefits and alternatives for the proposed anesthesia with the patient or authorized representative who has indicated his/her understanding and acceptance.   Dental advisory given  Plan Discussed with: CRNA  Anesthesia Plan Comments:        Anesthesia Quick Evaluation                                   Anesthesia Evaluation  Patient identified by MRN, date of birth, ID band Patient awake    Reviewed: Allergy & Precautions, H&P , NPO status , Patient's Chart, lab work & pertinent test results, reviewed  documented beta blocker date and time   Airway Mallampati: II TM Distance: >3 FB Neck ROM: Full    Dental no notable dental hx. (+) Edentulous Upper, Dental Advisory Given   Pulmonary neg pulmonary ROS,  breath sounds clear to auscultation  Pulmonary exam normal       Cardiovascular hypertension, Pt. on medications and Pt. on home beta blockers + Peripheral Vascular Disease Rhythm:Regular Rate:Normal     Neuro/Psych negative neurological ROS  negative psych ROS   GI/Hepatic negative GI ROS, Neg liver ROS,   Endo/Other  negative endocrine ROS  Renal/GU negative Renal ROS  negative genitourinary   Musculoskeletal  (+) Arthritis -, Osteoarthritis,    Abdominal   Peds  Hematology negative hematology ROS (+)   Anesthesia Other Findings   Reproductive/Obstetrics negative OB ROS                         Anesthesia Physical Anesthesia Plan  ASA: II  Anesthesia Plan: General   Post-op Pain Management:    Induction: Intravenous  Airway Management Planned: Oral ETT  Additional Equipment:   Intra-op Plan:   Post-operative Plan: Extubation in OR  Informed Consent: I have reviewed the patients History and Physical, chart, labs and discussed the procedure including the risks, benefits and alternatives for the proposed anesthesia with the patient or authorized representative who has indicated his/her understanding and acceptance.   Dental advisory given  Plan Discussed with: CRNA  Anesthesia Plan Comments:        Anesthesia Quick Evaluation

## 2019-01-27 NOTE — Progress Notes (Signed)
Anesthesia Chart Review   Case: [id number] Date/Time: 01/28/19 1045   Procedures:      HYSTERECTOMY VAGINAL possible BSO  **POSSIBLE** (Bilateral )     ANTERIOR REPAIR (CYSTOCELE)  POSSIBLE (N/A )     CYSTOSCOPY POSSIBLE (N/A )   Anesthesia type: General   Pre-op diagnosis: Uterine prolapse, cystocele   Location: [facility] ROOM 1 / [place]   Surgeon: [name], MD      DISCUSSION:85 y.o. never smoker with h/o HTN, HLD, A-fib, uterine prolapse, cystocele scheduled for above procedure 01/28/2019 with Dr. Sumner Boast.   EKG with A-fib at PAT visit.  Pt scheduled for see PCP, Dr. Alain Marion, after PAT visit.  EKG forwarded to Dr. Alain Marion to review.  Per his note, "In NSR now".  He states she is clear for surgery.     Anticipate pt can proceed with planned procedure barring acute status change.   VS: Pulse (P) 94   Temp (P) 37.2 C (Oral)   Resp (P) 16   Ht (P) 5' (1.524 m)   Wt (P) 73.8 kg   SpO2 (P) 97%   BMI (P) 31.79 kg/m   PROVIDERS: Plotnikov, Evie Lacks, MD is PCP    LABS: Labs reviewed: Acceptable for surgery. (all labs ordered are listed, but only abnormal results are displayed)  Labs Reviewed  COMPREHENSIVE METABOLIC PANEL - Abnormal; Notable for the following components:      Result Value   Glucose, Bld 104 (*)    All other components within normal limits  CBC  TYPE AND SCREEN     IMAGES:   EKG: 01/23/2019 Rate 93 bpm Atrial fibrillation Moderate voltage criteria for LVH, may be normal variant Abnormal ECG  CV: Stress Test 03/18/2014 Low risk stress test - small area of mild anteroapical reversibility - may shifting breast artifact. No high risk findings. Normal wall motion.   Echo 9/15 EF 55-60%, normal wall motion, grade 1 diastolic dysfunction, mild AI, MAC, trivial TR, trivial PI, atrial septal lipomatous hypertrophy Past Medical History:  Diagnosis Date  . Anxiety   . Atrial fibrillation ([place])   . Cystocele with  prolapse   . Dislocated shoulder 2012   right  . Edema    left leg  . Fatty liver   . Gait disturbance   . Gallstone    1.5 cm  . Ganglion cyst   . Glaucoma   . HTN (hypertension)   . Hyperlipidemia   . LBP (low back pain)   . Obesity   . Osteoarthritis   . Rosacea   . Shortness of breath    with activities and exertion, pt denies 01/23/2019  . Venous insufficiency of leg   . Vertigo    not current 01/23/2019  . Vitamin B 12 deficiency     Past Surgical History:  Procedure Laterality Date  . CHOLECYSTECTOMY N/A 03/27/2014   Procedure: LAPAROSCOPIC CHOLECYSTECTOMY WITH INTRAOPERATIVE CHOLANGIOGRAM;  Surgeon: Fanny Skates, MD;  Location: [facility];  Service: General;  Laterality: N/A;  . TONSILLECTOMY      MEDICATIONS: . Ascorbic Acid (VITAMIN C) 1000 MG tablet  . aspirin 81 MG tablet  . bumetanide (BUMEX) 0.5 MG tablet  . Cod Liver Oil 1000 MG CAPS  . Coenzyme Q10 (CO Q 10 PO)  . CRANBERRY PO  . docusate sodium (COLACE) 100 MG capsule  . FLAXSEED, LINSEED, PO  . Garlic 371 MG CAPS  . Multiple Minerals (MULTI-MINERALS PO)  . polyethylene glycol (MIRALAX /  GLYCOLAX) 17 g packet  . potassium chloride (K-DUR) 10 MEQ tablet  . torsemide (DEMADEX) 20 MG tablet  . Zinc Sulfate (ZINC 15 PO)   No current facility-administered medications for this encounter.      Maia [facility] Pre-Surgical Testing [phone] 01/27/19  1:19 PM

## 2019-01-27 NOTE — Telephone Encounter (Signed)
[facility] informed of below.

## 2019-01-28 ENCOUNTER — Encounter (HOSPITAL_BASED_OUTPATIENT_CLINIC_OR_DEPARTMENT_OTHER): Payer: Self-pay

## 2019-01-28 ENCOUNTER — Observation Stay (HOSPITAL_BASED_OUTPATIENT_CLINIC_OR_DEPARTMENT_OTHER): Payer: Medicare Other | Admitting: Physician Assistant

## 2019-01-28 ENCOUNTER — Observation Stay (HOSPITAL_BASED_OUTPATIENT_CLINIC_OR_DEPARTMENT_OTHER): Payer: Medicare Other | Admitting: Anesthesiology

## 2019-01-28 ENCOUNTER — Ambulatory Visit (HOSPITAL_BASED_OUTPATIENT_CLINIC_OR_DEPARTMENT_OTHER)
Admission: RE | Admit: 2019-01-28 | Discharge: 2019-01-29 | Disposition: A | Discharge status: Home / Self Care | Payer: Medicare Other | Attending: Obstetrics and Gynecology | Admitting: Obstetrics and Gynecology

## 2019-01-28 ENCOUNTER — Encounter (HOSPITAL_BASED_OUTPATIENT_CLINIC_OR_DEPARTMENT_OTHER)
Admission: RE | Disposition: A | Discharge status: Home / Self Care | Payer: Self-pay | Source: Home / Self Care | Attending: Obstetrics and Gynecology

## 2019-01-28 DIAGNOSIS — R269 Unspecified abnormalities of gait and mobility: Secondary | ICD-10-CM | POA: Diagnosis not present

## 2019-01-28 DIAGNOSIS — N813 Complete uterovaginal prolapse: Secondary | ICD-10-CM | POA: Insufficient documentation

## 2019-01-28 DIAGNOSIS — E785 Hyperlipidemia, unspecified: Secondary | ICD-10-CM | POA: Diagnosis not present

## 2019-01-28 DIAGNOSIS — H409 Unspecified glaucoma: Secondary | ICD-10-CM | POA: Insufficient documentation

## 2019-01-28 DIAGNOSIS — I493 Ventricular premature depolarization: Secondary | ICD-10-CM | POA: Insufficient documentation

## 2019-01-28 DIAGNOSIS — E669 Obesity, unspecified: Secondary | ICD-10-CM | POA: Insufficient documentation

## 2019-01-28 DIAGNOSIS — C542 Malignant neoplasm of myometrium: Secondary | ICD-10-CM | POA: Diagnosis not present

## 2019-01-28 DIAGNOSIS — K760 Fatty (change of) liver, not elsewhere classified: Secondary | ICD-10-CM | POA: Insufficient documentation

## 2019-01-28 DIAGNOSIS — Z803 Family history of malignant neoplasm of breast: Secondary | ICD-10-CM | POA: Insufficient documentation

## 2019-01-28 DIAGNOSIS — Z7982 Long term (current) use of aspirin: Secondary | ICD-10-CM | POA: Diagnosis not present

## 2019-01-28 DIAGNOSIS — Z8249 Family history of ischemic heart disease and other diseases of the circulatory system: Secondary | ICD-10-CM | POA: Insufficient documentation

## 2019-01-28 DIAGNOSIS — L719 Rosacea, unspecified: Secondary | ICD-10-CM | POA: Diagnosis not present

## 2019-01-28 DIAGNOSIS — E559 Vitamin D deficiency, unspecified: Secondary | ICD-10-CM | POA: Diagnosis not present

## 2019-01-28 DIAGNOSIS — Z888 Allergy status to other drugs, medicaments and biological substances status: Secondary | ICD-10-CM | POA: Insufficient documentation

## 2019-01-28 DIAGNOSIS — H9190 Unspecified hearing loss, unspecified ear: Secondary | ICD-10-CM | POA: Insufficient documentation

## 2019-01-28 DIAGNOSIS — Z90710 Acquired absence of both cervix and uterus: Secondary | ICD-10-CM | POA: Diagnosis present

## 2019-01-28 DIAGNOSIS — N814 Uterovaginal prolapse, unspecified: Secondary | ICD-10-CM | POA: Diagnosis not present

## 2019-01-28 DIAGNOSIS — M1990 Unspecified osteoarthritis, unspecified site: Secondary | ICD-10-CM | POA: Diagnosis not present

## 2019-01-28 DIAGNOSIS — N819 Female genital prolapse, unspecified: Secondary | ICD-10-CM | POA: Diagnosis present

## 2019-01-28 DIAGNOSIS — N8111 Cystocele, midline: Secondary | ICD-10-CM | POA: Diagnosis not present

## 2019-01-28 DIAGNOSIS — F419 Anxiety disorder, unspecified: Secondary | ICD-10-CM | POA: Diagnosis not present

## 2019-01-28 DIAGNOSIS — Z6831 Body mass index (BMI) 31.0-31.9, adult: Secondary | ICD-10-CM | POA: Insufficient documentation

## 2019-01-28 DIAGNOSIS — Z79899 Other long term (current) drug therapy: Secondary | ICD-10-CM | POA: Insufficient documentation

## 2019-01-28 DIAGNOSIS — C541 Malignant neoplasm of endometrium: Secondary | ICD-10-CM | POA: Insufficient documentation

## 2019-01-28 DIAGNOSIS — G8929 Other chronic pain: Secondary | ICD-10-CM | POA: Insufficient documentation

## 2019-01-28 DIAGNOSIS — I872 Venous insufficiency (chronic) (peripheral): Secondary | ICD-10-CM | POA: Diagnosis not present

## 2019-01-28 DIAGNOSIS — Z7901 Long term (current) use of anticoagulants: Secondary | ICD-10-CM | POA: Diagnosis not present

## 2019-01-28 DIAGNOSIS — I4891 Unspecified atrial fibrillation: Secondary | ICD-10-CM | POA: Diagnosis not present

## 2019-01-28 DIAGNOSIS — N736 Female pelvic peritoneal adhesions (postinfective): Secondary | ICD-10-CM | POA: Diagnosis not present

## 2019-01-28 DIAGNOSIS — D259 Leiomyoma of uterus, unspecified: Secondary | ICD-10-CM | POA: Diagnosis not present

## 2019-01-28 DIAGNOSIS — I10 Essential (primary) hypertension: Secondary | ICD-10-CM | POA: Diagnosis not present

## 2019-01-28 DIAGNOSIS — Z9049 Acquired absence of other specified parts of digestive tract: Secondary | ICD-10-CM | POA: Insufficient documentation

## 2019-01-28 DIAGNOSIS — N3941 Urge incontinence: Secondary | ICD-10-CM | POA: Diagnosis not present

## 2019-01-28 DIAGNOSIS — Z800 Family history of malignant neoplasm of digestive organs: Secondary | ICD-10-CM | POA: Insufficient documentation

## 2019-01-28 HISTORY — PX: CYSTOCELE REPAIR: SHX163

## 2019-01-28 HISTORY — PX: CYSTOSCOPY: SHX5120

## 2019-01-28 HISTORY — PX: VAGINAL HYSTERECTOMY: SHX2639

## 2019-01-28 LAB — CBC
HCT: 34 % — ABNORMAL LOW (ref 36.0–46.0)
Hemoglobin: 10.9 g/dL — ABNORMAL LOW (ref 12.0–15.0)
MCH: 31.2 pg (ref 26.0–34.0)
MCHC: 32.1 g/dL (ref 30.0–36.0)
MCV: 97.4 fL (ref 80.0–100.0)
Platelets: 171 10*3/uL (ref 150–400)
RBC: 3.49 MIL/uL — ABNORMAL LOW (ref 3.87–5.11)
RDW: 13.2 % (ref 11.5–15.5)
WBC: 10.9 10*3/uL — ABNORMAL HIGH (ref 4.0–10.5)
nRBC: 0 % (ref 0.0–0.2)

## 2019-01-28 LAB — URINALYSIS, COMPLETE (UACMP) WITH MICROSCOPIC: RBC / HPF: >50 RBC/hpf (ref 0–5)

## 2019-01-28 LAB — COMPREHENSIVE METABOLIC PANEL
ALT: 19 U/L (ref 0–44)
AST: 23 U/L (ref 15–41)
Albumin: 3.1 g/dL — ABNORMAL LOW (ref 3.5–5.0)
Alkaline Phosphatase: 64 U/L (ref 38–126)
Anion gap: 8 (ref 5–15)
BUN: 10 mg/dL (ref 8–23)
CO2: 27 mmol/L (ref 22–32)
Calcium: 8.5 mg/dL — ABNORMAL LOW (ref 8.9–10.3)
Chloride: 101 mmol/L (ref 98–111)
Creatinine, Ser: 0.84 mg/dL (ref 0.44–1.00)
GFR calc Af Amer: >60 mL/min (ref >60)
GFR calc non Af Amer: >60 mL/min (ref >60)
Glucose, Bld: 240 mg/dL — ABNORMAL HIGH (ref 70–99)
Potassium: 3.4 mmol/L — ABNORMAL LOW (ref 3.5–5.1)
Sodium: 136 mmol/L (ref 135–145)
Total Bilirubin: 0.7 mg/dL (ref 0.3–1.2)
Total Protein: 6 g/dL — ABNORMAL LOW (ref 6.5–8.1)

## 2019-01-28 SURGERY — HYSTERECTOMY, VAGINAL
Anesthesia: General | Site: Vagina

## 2019-01-28 MED ORDER — ENOXAPARIN SODIUM 40 MG/0.4ML ~~LOC~~ SOLN
SUBCUTANEOUS | Status: AC
  Filled 2019-01-28: qty 0.4

## 2019-01-28 MED ORDER — DEXAMETHASONE SODIUM PHOSPHATE 10 MG/ML IJ SOLN
INTRAMUSCULAR | Status: DC | PRN
  Administered 2019-01-28: 4 mg via INTRAVENOUS

## 2019-01-28 MED ORDER — ACETAMINOPHEN 500 MG PO TABS
1000.0000 mg | ORAL_TABLET | Freq: Four times a day (QID) | ORAL | Status: DC
  Administered 2019-01-28 – 2019-01-29 (×3): 1000 mg via ORAL
  Filled 2019-01-28: qty 2

## 2019-01-28 MED ORDER — IBUPROFEN 400 MG PO TABS
400.0000 mg | ORAL_TABLET | ORAL | Status: DC | PRN
  Filled 2019-01-28: qty 1

## 2019-01-28 MED ORDER — CEFAZOLIN SODIUM-DEXTROSE 2-4 GM/100ML-% IV SOLN
INTRAVENOUS | Status: AC
  Filled 2019-01-28: qty 100

## 2019-01-28 MED ORDER — ENOXAPARIN SODIUM 40 MG/0.4ML ~~LOC~~ SOLN
40.0000 mg | SUBCUTANEOUS | Status: AC
  Administered 2019-01-28: 09:00:00 40 mg via SUBCUTANEOUS
  Filled 2019-01-28: qty 0.4

## 2019-01-28 MED ORDER — ACETAMINOPHEN 500 MG PO TABS
ORAL_TABLET | ORAL | Status: AC
  Filled 2019-01-28: qty 2

## 2019-01-28 MED ORDER — KETOROLAC TROMETHAMINE 15 MG/ML IJ SOLN
15.0000 mg | Freq: Four times a day (QID) | INTRAMUSCULAR | Status: DC | PRN
  Administered 2019-01-28: 18:00:00 15 mg via INTRAVENOUS
  Filled 2019-01-28: qty 1

## 2019-01-28 MED ORDER — LIDOCAINE 2% (20 MG/ML) 5 ML SYRINGE
INTRAMUSCULAR | Status: DC | PRN
  Administered 2019-01-28: 60 mg via INTRAVENOUS

## 2019-01-28 MED ORDER — TORSEMIDE 20 MG PO TABS
20.0000 mg | ORAL_TABLET | Freq: Every day | ORAL | Status: DC
  Administered 2019-01-29: 20 mg via ORAL
  Filled 2019-01-28 (×2): qty 1

## 2019-01-28 MED ORDER — ROCURONIUM BROMIDE 10 MG/ML (PF) SYRINGE
PREFILLED_SYRINGE | INTRAVENOUS | Status: AC
  Filled 2019-01-28: qty 10

## 2019-01-28 MED ORDER — ONDANSETRON HCL 4 MG/2ML IJ SOLN
INTRAMUSCULAR | Status: DC | PRN
  Administered 2019-01-28: 4 mg via INTRAVENOUS

## 2019-01-28 MED ORDER — OXYCODONE HCL 5 MG/5ML PO SOLN
5.0000 mg | Freq: Once | ORAL | Status: DC | PRN
  Filled 2019-01-28: qty 5

## 2019-01-28 MED ORDER — ROCURONIUM BROMIDE 10 MG/ML (PF) SYRINGE
PREFILLED_SYRINGE | INTRAVENOUS | Status: DC | PRN
  Administered 2019-01-28: 40 mg via INTRAVENOUS
  Administered 2019-01-28: 5 mg via INTRAVENOUS

## 2019-01-28 MED ORDER — KETOROLAC TROMETHAMINE 30 MG/ML IJ SOLN
INTRAMUSCULAR | Status: AC
  Filled 2019-01-28: qty 1

## 2019-01-28 MED ORDER — MENTHOL 3 MG MT LOZG
1.0000 | LOZENGE | OROMUCOSAL | Status: DC | PRN
  Filled 2019-01-28: qty 9

## 2019-01-28 MED ORDER — LIDOCAINE 2% (20 MG/ML) 5 ML SYRINGE
INTRAMUSCULAR | Status: AC
  Filled 2019-01-28: qty 5

## 2019-01-28 MED ORDER — ONDANSETRON HCL 4 MG/2ML IJ SOLN
4.0000 mg | Freq: Four times a day (QID) | INTRAMUSCULAR | Status: DC | PRN
  Filled 2019-01-28: qty 2

## 2019-01-28 MED ORDER — KETOROLAC TROMETHAMINE 15 MG/ML IJ SOLN
15.0000 mg | Freq: Four times a day (QID) | INTRAMUSCULAR | Status: DC | PRN
  Filled 2019-01-28: qty 1

## 2019-01-28 MED ORDER — DOCUSATE SODIUM 100 MG PO CAPS
ORAL_CAPSULE | ORAL | Status: AC
  Filled 2019-01-28: qty 1

## 2019-01-28 MED ORDER — HYDROMORPHONE HCL 1 MG/ML IJ SOLN
0.2000 mg | INTRAMUSCULAR | Status: DC | PRN
  Filled 2019-01-28: qty 1

## 2019-01-28 MED ORDER — OXYCODONE HCL 5 MG PO TABS
ORAL_TABLET | ORAL | Status: AC
  Filled 2019-01-28: qty 1

## 2019-01-28 MED ORDER — POLYETHYLENE GLYCOL 3350 17 G PO PACK
17.0000 g | PACK | Freq: Every day | ORAL | Status: DC | PRN
  Filled 2019-01-28: qty 1

## 2019-01-28 MED ORDER — ALUM & MAG HYDROXIDE-SIMETH 200-200-20 MG/5ML PO SUSP
30.0000 mL | ORAL | Status: DC | PRN
  Filled 2019-01-28: qty 30

## 2019-01-28 MED ORDER — PROPOFOL 10 MG/ML IV BOLUS
INTRAVENOUS | Status: AC
  Filled 2019-01-28: qty 20

## 2019-01-28 MED ORDER — ONDANSETRON HCL 4 MG PO TABS
4.0000 mg | ORAL_TABLET | Freq: Four times a day (QID) | ORAL | Status: DC | PRN
  Filled 2019-01-28: qty 1

## 2019-01-28 MED ORDER — KCL IN DEXTROSE-NACL 20-5-0.45 MEQ/L-%-% IV SOLN
INTRAVENOUS | Status: DC
  Administered 2019-01-28: 18:00:00 via INTRAVENOUS
  Filled 2019-01-28 (×2): qty 1000

## 2019-01-28 MED ORDER — GABAPENTIN 300 MG PO CAPS
ORAL_CAPSULE | ORAL | Status: AC
  Filled 2019-01-28: qty 1

## 2019-01-28 MED ORDER — ESTRADIOL 0.1 MG/GM VA CREA
TOPICAL_CREAM | VAGINAL | Status: DC | PRN
  Administered 2019-01-28: 1 via VAGINAL

## 2019-01-28 MED ORDER — DOCUSATE SODIUM 100 MG PO CAPS
100.0000 mg | ORAL_CAPSULE | Freq: Two times a day (BID) | ORAL | Status: DC
  Administered 2019-01-28 – 2019-01-29 (×2): 100 mg via ORAL
  Filled 2019-01-28: qty 1

## 2019-01-28 MED ORDER — FENTANYL CITRATE (PF) 100 MCG/2ML IJ SOLN
INTRAMUSCULAR | Status: AC
  Filled 2019-01-28: qty 2

## 2019-01-28 MED ORDER — ONDANSETRON HCL 4 MG/2ML IJ SOLN
INTRAMUSCULAR | Status: AC
  Filled 2019-01-28: qty 2

## 2019-01-28 MED ORDER — FENTANYL CITRATE (PF) 100 MCG/2ML IJ SOLN
INTRAMUSCULAR | Status: DC | PRN
  Administered 2019-01-28: 50 ug via INTRAVENOUS
  Administered 2019-01-28 (×2): 25 ug via INTRAVENOUS

## 2019-01-28 MED ORDER — MEPERIDINE HCL 25 MG/ML IJ SOLN
6.2500 mg | INTRAMUSCULAR | Status: DC | PRN
  Filled 2019-01-28: qty 1

## 2019-01-28 MED ORDER — PHENYLEPHRINE 40 MCG/ML (10ML) SYRINGE FOR IV PUSH (FOR BLOOD PRESSURE SUPPORT)
PREFILLED_SYRINGE | INTRAVENOUS | Status: AC
  Filled 2019-01-28: qty 10

## 2019-01-28 MED ORDER — FENTANYL CITRATE (PF) 100 MCG/2ML IJ SOLN
25.0000 ug | INTRAMUSCULAR | Status: DC | PRN
  Administered 2019-01-28: 50 ug via INTRAVENOUS
  Administered 2019-01-28 (×2): 25 ug via INTRAVENOUS
  Filled 2019-01-28: qty 1

## 2019-01-28 MED ORDER — OXYCODONE HCL 5 MG PO TABS
5.0000 mg | ORAL_TABLET | ORAL | Status: DC | PRN
  Administered 2019-01-28 (×2): 5 mg via ORAL
  Filled 2019-01-28: qty 2

## 2019-01-28 MED ORDER — GABAPENTIN 300 MG PO CAPS
300.0000 mg | ORAL_CAPSULE | ORAL | Status: AC
  Administered 2019-01-28: 300 mg via ORAL
  Filled 2019-01-28: qty 1

## 2019-01-28 MED ORDER — DEXAMETHASONE SODIUM PHOSPHATE 10 MG/ML IJ SOLN
INTRAMUSCULAR | Status: AC
  Filled 2019-01-28: qty 1

## 2019-01-28 MED ORDER — OXYCODONE HCL 5 MG PO TABS
5.0000 mg | ORAL_TABLET | Freq: Once | ORAL | Status: DC | PRN
  Filled 2019-01-28: qty 1

## 2019-01-28 MED ORDER — LACTATED RINGERS IV SOLN
INTRAVENOUS | Status: DC
  Administered 2019-01-28 (×2): via INTRAVENOUS
  Filled 2019-01-28: qty 1000

## 2019-01-28 MED ORDER — IBUPROFEN 200 MG PO TABS
ORAL_TABLET | ORAL | Status: AC
  Filled 2019-01-28: qty 2

## 2019-01-28 MED ORDER — SODIUM CHLORIDE 0.9 % IR SOLN
Status: DC | PRN
  Administered 2019-01-28: 1000 mL via INTRAVESICAL

## 2019-01-28 MED ORDER — PHENYLEPHRINE 40 MCG/ML (10ML) SYRINGE FOR IV PUSH (FOR BLOOD PRESSURE SUPPORT)
PREFILLED_SYRINGE | INTRAVENOUS | Status: DC | PRN
  Administered 2019-01-28 (×2): 80 ug via INTRAVENOUS

## 2019-01-28 MED ORDER — IBUPROFEN 100 MG/5ML PO SUSP
200.0000 mg | Freq: Four times a day (QID) | ORAL | Status: DC | PRN
  Filled 2019-01-28: qty 30

## 2019-01-28 MED ORDER — PROPOFOL 10 MG/ML IV BOLUS
INTRAVENOUS | Status: DC | PRN
  Administered 2019-01-28: 130 mg via INTRAVENOUS

## 2019-01-28 MED ORDER — ACETAMINOPHEN 10 MG/ML IV SOLN
1000.0000 mg | Freq: Once | INTRAVENOUS | Status: DC | PRN
  Filled 2019-01-28: qty 100

## 2019-01-28 MED ORDER — ONDANSETRON HCL 4 MG/2ML IJ SOLN
4.0000 mg | Freq: Once | INTRAMUSCULAR | Status: DC | PRN
  Filled 2019-01-28: qty 2

## 2019-01-28 MED ORDER — CEFAZOLIN SODIUM-DEXTROSE 2-4 GM/100ML-% IV SOLN
2.0000 g | INTRAVENOUS | Status: AC
  Administered 2019-01-28: 2 g via INTRAVENOUS
  Filled 2019-01-28: qty 100

## 2019-01-28 MED ORDER — SUGAMMADEX SODIUM 200 MG/2ML IV SOLN
INTRAVENOUS | Status: DC | PRN
  Administered 2019-01-28: 150 mg via INTRAVENOUS

## 2019-01-28 MED ORDER — VASOPRESSIN 20 UNIT/ML IV SOLN
INTRAVENOUS | Status: DC | PRN
  Administered 2019-01-28: 18 mL via INTRAMUSCULAR

## 2019-01-28 MED ORDER — KCL IN DEXTROSE-NACL 20-5-0.45 MEQ/L-%-% IV SOLN
INTRAVENOUS | Status: DC
  Administered 2019-01-29: 02:00:00 via INTRAVENOUS
  Filled 2019-01-28 (×2): qty 1000

## 2019-01-28 MED ORDER — BUMETANIDE 0.5 MG PO TABS
0.5000 mg | ORAL_TABLET | Freq: Every day | ORAL | Status: DC
  Administered 2019-01-29: 0.5 mg via ORAL
  Filled 2019-01-28 (×2): qty 1

## 2019-01-28 MED ORDER — ENOXAPARIN SODIUM 40 MG/0.4ML ~~LOC~~ SOLN
40.0000 mg | SUBCUTANEOUS | Status: DC
  Filled 2019-01-28: qty 0.4

## 2019-01-28 MED ORDER — ACETAMINOPHEN 500 MG PO TABS
1000.0000 mg | ORAL_TABLET | ORAL | Status: AC
  Administered 2019-01-28: 1000 mg via ORAL
  Filled 2019-01-28: qty 2

## 2019-01-28 MED ORDER — IBUPROFEN 200 MG PO TABS
200.0000 mg | ORAL_TABLET | Freq: Four times a day (QID) | ORAL | Status: DC | PRN
  Filled 2019-01-28: qty 2

## 2019-01-28 MED ORDER — POTASSIUM CHLORIDE ER 10 MEQ PO TBCR
10.0000 meq | EXTENDED_RELEASE_TABLET | Freq: Every day | ORAL | Status: DC | PRN
  Filled 2019-01-28: qty 1

## 2019-01-28 SURGICAL SUPPLY — 48 items
AGENT HMST KT MTR STRL THRMB (HEMOSTASIS)
CANISTER SUCT 3000ML PPV (MISCELLANEOUS) ×6 IMPLANT
CLOTH BEACON ORANGE TIMEOUT ST (SAFETY) ×6 IMPLANT
CONT PATH 16OZ SNAP LID 3702 (MISCELLANEOUS) IMPLANT
COVER WAND RF STERILE (DRAPES) ×12 IMPLANT
DECANTER SPIKE VIAL GLASS SM (MISCELLANEOUS) IMPLANT
GAUZE 4X4 16PLY RFD (DISPOSABLE) ×6 IMPLANT
GAUZE PACKING 2X5 YD STRL (GAUZE/BANDAGES/DRESSINGS) ×6 IMPLANT
GLOVE BIO SURGEON STRL SZ 6.5 (GLOVE) ×6 IMPLANT
GLOVE BIOGEL PI IND STRL 7.0 (GLOVE) ×10 IMPLANT
GLOVE BIOGEL PI IND STRL 8.5 (GLOVE) IMPLANT
GLOVE BIOGEL PI INDICATOR 7.0 (GLOVE) ×3
GLOVE BIOGEL PI INDICATOR 8.5 (GLOVE) ×1
GLOVE ECLIPSE 6.5 STRL STRAW (GLOVE) ×6 IMPLANT
GLOVE INDICATOR 8.5 STRL (GLOVE) ×1 IMPLANT
GLOVE SURG SS PI 7.5 STRL IVOR (GLOVE) ×2 IMPLANT
GOWN STRL REUS W/ TWL XL LVL3 (GOWN DISPOSABLE) IMPLANT
GOWN STRL REUS W/TWL LRG LVL3 (GOWN DISPOSABLE) ×26 IMPLANT
GOWN STRL REUS W/TWL XL LVL3 (GOWN DISPOSABLE) ×4
LIGASURE IMPACT 36 18CM CVD LR (INSTRUMENTS) ×1 IMPLANT
NDL SPNL 25GX3.5 QUINCKE BL (NEEDLE) ×5 IMPLANT
NEEDLE HYPO 22GX1.5 SAFETY (NEEDLE) ×6 IMPLANT
NEEDLE SPNL 25GX3.5 QUINCKE BL (NEEDLE) ×4 IMPLANT
NS IRRIG 1000ML POUR BTL (IV SOLUTION) ×6 IMPLANT
PACK VAGINAL WOMENS (CUSTOM PROCEDURE TRAY) ×6 IMPLANT
PACKING VAGINAL (PACKING) ×1 IMPLANT
PAD OB MATERNITY 4.3X12.25 (PERSONAL CARE ITEMS) ×12 IMPLANT
SET IRRIG Y TYPE TUR BLADDER L (SET/KITS/TRAYS/PACK) IMPLANT
SPONGE SURGIFOAM ABS GEL 100 (HEMOSTASIS) IMPLANT
SPONGE SURGIFOAM ABS GEL 12-7 (HEMOSTASIS) ×2 IMPLANT
SURGIFLO W/THROMBIN 8M KIT (HEMOSTASIS) IMPLANT
SUT MNCRL AB 4-0 PS2 18 (SUTURE) IMPLANT
SUT PDS AB 0 CT 36 (SUTURE) IMPLANT
SUT PDS AB 0 CT1 36 (SUTURE) IMPLANT
SUT PDS AB 3-0 SH 27 (SUTURE) IMPLANT
SUT VIC AB 0 CT1 18XCR BRD8 (SUTURE) ×15 IMPLANT
SUT VIC AB 0 CT1 27 (SUTURE) ×4
SUT VIC AB 0 CT1 27XBRD ANBCTR (SUTURE) IMPLANT
SUT VIC AB 0 CT1 36 (SUTURE) ×12 IMPLANT
SUT VIC AB 0 CT1 8-18 (SUTURE) ×12
SUT VIC AB 2-0 CT2 27 (SUTURE) ×1 IMPLANT
SUT VIC AB 2-0 SH 27 (SUTURE) ×8
SUT VIC AB 2-0 SH 27XBRD (SUTURE) ×10 IMPLANT
SUT VICRYL 0 TIES 12 18 (SUTURE) ×6 IMPLANT
TOWEL OR 17X26 10 PK STRL BLUE (TOWEL DISPOSABLE) ×11 IMPLANT
TRAY FOLEY BAG SILVER LF 14FR (CATHETERS) ×6 IMPLANT
TRAY FOLEY W/BAG SLVR 14FR (SET/KITS/TRAYS/PACK) ×6 IMPLANT
TUBING TUR DISP (UROLOGICAL SUPPLIES) ×1 IMPLANT

## 2019-01-28 NOTE — Transfer of Care (Signed)
Last Vitals:  Vitals Value Taken Time  BP 145/76 01/28/19 1400  Temp    Pulse 79 01/28/19 1402  Resp 20 01/28/19 1402  SpO2 97 % 01/28/19 1402  Vitals shown include unvalidated device data.  Last Pain:  Vitals:   01/28/19 0855  TempSrc: Oral  PainSc: 0-No pain      Patients Stated Pain Goal: 4 (01/28/19 3794)  Immediate Anesthesia Transfer of Care Note  Patient: Kathleen Mejia  Procedure(s) Performed: Procedure(s) (LRB): HYSTERECTOMY VAGINAL WITH LEFT SALPINGECTOMY (Left) ANTERIOR REPAIR (CYSTOCELE) (N/A) CYSTOSCOPY (N/A)  Patient Location: PACU  Anesthesia Type: General  Level of Consciousness: awake, alert  and oriented  Airway & Oxygen Therapy: Patient Spontanous Breathing and Patient connected to nasal cannula oxygen  Post-op Assessment: Report given to PACU RN and Post -op Vital signs reviewed and stable  Post vital signs: Reviewed and stable  Complications: No apparent anesthesia complications

## 2019-01-28 NOTE — Op Note (Signed)
Day of Surgery Procedure(s) (LRB): HYSTERECTOMY VAGINAL WITH LEFT SALPINGECTOMY (Left) ANTERIOR REPAIR (CYSTOCELE) (N/A) CYSTOSCOPY (N/A)  Subjective: Patient reports back pain and vaginal burning. She just ate some crackers and is sipping on water. Not out of bed yet.   Objective: I have reviewed patient's vital signs and intake and output.  General: alert, cooperative and no distress Resp: clear to auscultation bilaterally Cardio: irregularly irregular rhythm GI: soft, not tender, +BS, mildly distended Extremities: extremities normal, atraumatic, no cyanosis or edema Vaginal Bleeding: minimal and vaginal pack removed, ~1/2 of the pack with staining.   Assessment: s/p Procedure(s): HYSTERECTOMY VAGINAL WITH LEFT SALPINGECTOMY (Left) ANTERIOR REPAIR (CYSTOCELE) (N/A) CYSTOSCOPY (N/A): stable and progressing well  Plan: Advance diet Encourage ambulation Advance to PO medication Continue foley due to urinary output monitoring and history of urinary retention and overflow incontinence. I'm not sure how well she will void s/p cystocele repair and don't want her running back and forth to the bathroom overnight (uses a walker) Will remove foley at 5 am.  CBC and CMP in am   LOS: 0 days    Kathleen Mejia 01/28/2019, 6:30 PM

## 2019-01-28 NOTE — Anesthesia Procedure Notes (Signed)
Procedure Name: Intubation Date/Time: 01/28/2019 11:34 AM Performed by: Lyn Hollingshead, MD Pre-anesthesia Checklist: Patient identified, Emergency Drugs available, Suction available and Patient being monitored Patient Re-evaluated:Patient Re-evaluated prior to induction Oxygen Delivery Method: Circle system utilized Preoxygenation: Pre-oxygenation with 100% oxygen Induction Type: IV induction Ventilation: Mask ventilation without difficulty Laryngoscope Size: Mac and 3 Grade View: Grade I Tube type: Oral Tube size: 7.0 mm Number of attempts: 1 Airway Equipment and Method: Stylet and Oral airway Placement Confirmation: ETT inserted through vocal cords under direct vision,  positive ETCO2 and breath sounds checked- equal and bilateral Secured at: 22 cm Tube secured with: Tape Dental Injury: Teeth and Oropharynx as per pre-operative assessment

## 2019-01-28 NOTE — Progress Notes (Deleted)
Patient sent to the ER for further evaluation.

## 2019-01-28 NOTE — Progress Notes (Addendum)
RN went into assess patient at 2130, patient's pain 4 out of 10, vitals WNL. Patient's urine was red and bloody. MD made aware. New orders given to get a stat CBC and CMP. MD also gave orders for a UA microscopic and urine culture. Also, orders to discontinue lovenox and toradol were given. Will contact MD when results come back.

## 2019-01-28 NOTE — Op Note (Signed)
Preoperative Diagnosis: grade 3 cystocele, grade 2 uterine prolapse  Postoperative Diagnosis: same, pelvic adhesions  Procedure:  Total Vaginal Hysterectomy with left salpingectomy,  Cystocele repair and cystoscopy  Surgeon: Dr Sumner Boast  Assistant: Dr Edwinna Areola, an MD assistant was necessary for tissue manipulation, retraction and positioning due to the complexity of the case and hospital policies   Anesthesia: General  EBL: 150 cc  Fluids: 1,000 cc LR  Urine output: 378 cc  Complications: none  Indications for surgery: The patient is a 85 year old female, who presented with severe genital prolapse and urinary retention. Many types of pessary were tried without success. Preoperative evaluation included a normal CBC and CMP. She had an ultrasound that showed a slightly thickened endometrial stripe (6 mm).  The patient is aware of the risks and complications involved with the surgery and consent was obtained prior to the procedure.  Findings: large grade 3 cystocele, grade 2 uterine prolapse, adhesions in the posterior cul de sac. Normal left adnexa, right adnexa not seen.   Procedure: The patient was taken to the operating room with an IV in placed, preoperative antibiotics had been administered. She was placed in the dorsal lithotomy position. General anesthesia was administered. She was prepped and draped in the usual sterile fashion for a vaginal surgery. A foley catheter was placed.   A weighted speculum was placed in the vaginal and the cervix was grasped with a tenaculum. The cervix was circumferentially injected with ~8 cc of vasopressin (10 IU in 100 cc normal saline), then incised with a #10 blade. The posterior peritoneum could not be entered at this time. The uterosacral ligaments were bilaterally clamped, cauterized and cut with the ligasure device.  The vaginal mucosa was dissected off of the anterior cervix sharply. The anterior cul de sac was sharply entered.  Placing a finger around the uterus the posterior cul de sac was palpated. A clear area in the posterior peritoneum was cut sharply with the Metzenbaum scissors, thereby entering the posterior cul de sac. The long weighted speculum was then placed into the vagina. The cardinal ligaments, uterine vessels and lower broad ligament were bilaterally cauterized and cut with the ligasure impact. The round ligament, tube and uterine ovarian ligaments were bilaterally grasped with a haney clamp, then cauterized and cut distal to the haney with the ligasure impact. A free tie, then stitch of 0-Vicryl was then placed behind the haney on either side. Hemostasis was excellent.  Only the left adnexa could be clearly seen. The left tube was grasped and removed with the Ligasure device. The left ovary was too high in the pelvis to be safely removed.   The cul de sac was carefully inspected, no injury was noted. The posterior vagina was whip stitched including the posterior peritoneum with excellent hemostasis. A rectal exam was performed and was normal. The examining glove was changed.   The anterior vagina cuff was grasped in the midline with alice clamps. Another alice clamp was placed several cm superiorly in the midline. The vaginal mucosa was injected with vasopressin, then undermined and incised in the midline This was repeated several times until the last Alice clamp was placed ~1.5 cm from the urethral meatus. The vaginal mucosa was dissected off of the bladder with a combination of sharp and blunt dissection  The cystocele was reduced with interrupted sutures of 0 vicryl. Given the very large cystocele a large amount of vaginal mucosa was trimmed. A small amount of gelfoam was placed under  the vaginal mucosa and the vaginal mucosa was closed with a running stitch of 2-0 vicryl. The vaginal cuff was closed with a running stitch of 0 Vicryl. Hemostasis was excellent.   The foley catheter was removed and cystoscopy was  performed using a 70 degree scope. Both ureters expelled urine, no bladder abnormalities were noted. The bladder was allowed to drain and the cystoscope was removed. The foley catheter were replaced. A vaginal pack with estrace cream was placed in the vagina.   The patient's abdomen and perineum were cleansed and she was taken out of the dorsal lithotomy position. Upon awakening she was extubated and taken to the recovery room in stable condition. The sponge and instrument counts were correct.

## 2019-01-28 NOTE — Interval H&P Note (Signed)
History and Physical Interval Note:  01/28/2019 11:09 AM  Kathleen Mejia  has presented today for surgery, with the diagnosis of Uterine prolapse, cystocele.  The various methods of treatment have been discussed with the patient and family. After consideration of risks, benefits and other options for treatment, the patient has consented to  Procedure(s): HYSTERECTOMY VAGINAL possible BSO  **POSSIBLE** (Bilateral) ANTERIOR REPAIR (CYSTOCELE)  POSSIBLE (N/A) CYSTOSCOPY POSSIBLE (N/A) as a surgical intervention.  The patient's history has been reviewed, patient examined, no change in status, stable for surgery.  I have reviewed the patient's chart and labs.  Questions were answered to the patient's satisfaction.     [name]

## 2019-01-28 NOTE — Anesthesia Postprocedure Evaluation (Signed)
Anesthesia Post Note  Patient: Miaisabella Bacorn Vangilder  Procedure(s) Performed: HYSTERECTOMY VAGINAL WITH LEFT SALPINGECTOMY (Left Vagina ) ANTERIOR REPAIR (CYSTOCELE) (N/A Vagina ) CYSTOSCOPY (N/A Bladder)     Patient location during evaluation: PACU Anesthesia Type: General Level of consciousness: awake and alert Pain management: pain level controlled Vital Signs Assessment: post-procedure vital signs reviewed and stable Respiratory status: spontaneous breathing, nonlabored ventilation, respiratory function stable and patient connected to nasal cannula oxygen Cardiovascular status: blood pressure returned to baseline and stable Postop Assessment: no apparent nausea or vomiting Anesthetic complications: no    Last Vitals:  Vitals:   01/28/19 1400 01/28/19 1415  BP: (!) 145/76   Pulse: 95 85  Resp: 18 (!) 21  Temp: 36.4 C   SpO2: 97% 97%    Last Pain:  Vitals:   01/28/19 1400  TempSrc:   PainSc: 0-No pain                 Barnet Glasgow

## 2019-01-29 ENCOUNTER — Encounter (HOSPITAL_BASED_OUTPATIENT_CLINIC_OR_DEPARTMENT_OTHER): Payer: Self-pay | Admitting: Obstetrics and Gynecology

## 2019-01-29 DIAGNOSIS — M1990 Unspecified osteoarthritis, unspecified site: Secondary | ICD-10-CM | POA: Diagnosis not present

## 2019-01-29 DIAGNOSIS — E785 Hyperlipidemia, unspecified: Secondary | ICD-10-CM | POA: Diagnosis not present

## 2019-01-29 DIAGNOSIS — I493 Ventricular premature depolarization: Secondary | ICD-10-CM | POA: Diagnosis not present

## 2019-01-29 DIAGNOSIS — D259 Leiomyoma of uterus, unspecified: Secondary | ICD-10-CM | POA: Diagnosis not present

## 2019-01-29 DIAGNOSIS — N736 Female pelvic peritoneal adhesions (postinfective): Secondary | ICD-10-CM | POA: Diagnosis not present

## 2019-01-29 DIAGNOSIS — Z6831 Body mass index (BMI) 31.0-31.9, adult: Secondary | ICD-10-CM | POA: Diagnosis not present

## 2019-01-29 DIAGNOSIS — H409 Unspecified glaucoma: Secondary | ICD-10-CM | POA: Diagnosis not present

## 2019-01-29 DIAGNOSIS — E669 Obesity, unspecified: Secondary | ICD-10-CM | POA: Diagnosis not present

## 2019-01-29 DIAGNOSIS — N813 Complete uterovaginal prolapse: Secondary | ICD-10-CM | POA: Diagnosis not present

## 2019-01-29 DIAGNOSIS — C541 Malignant neoplasm of endometrium: Secondary | ICD-10-CM | POA: Diagnosis not present

## 2019-01-29 DIAGNOSIS — H9190 Unspecified hearing loss, unspecified ear: Secondary | ICD-10-CM | POA: Diagnosis not present

## 2019-01-29 DIAGNOSIS — R269 Unspecified abnormalities of gait and mobility: Secondary | ICD-10-CM | POA: Diagnosis not present

## 2019-01-29 DIAGNOSIS — I4891 Unspecified atrial fibrillation: Secondary | ICD-10-CM | POA: Diagnosis not present

## 2019-01-29 DIAGNOSIS — F419 Anxiety disorder, unspecified: Secondary | ICD-10-CM | POA: Diagnosis not present

## 2019-01-29 DIAGNOSIS — K760 Fatty (change of) liver, not elsewhere classified: Secondary | ICD-10-CM | POA: Diagnosis not present

## 2019-01-29 DIAGNOSIS — N3941 Urge incontinence: Secondary | ICD-10-CM | POA: Diagnosis not present

## 2019-01-29 LAB — CBC
HCT: 30.3 % — ABNORMAL LOW (ref 36.0–46.0)
HCT: 32 % — ABNORMAL LOW (ref 36.0–46.0)
Hemoglobin: 9.7 g/dL — ABNORMAL LOW (ref 12.0–15.0)
Hemoglobin: 10.3 g/dL — ABNORMAL LOW (ref 12.0–15.0)
MCH: 31.1 pg (ref 26.0–34.0)
MCH: 31.4 pg (ref 26.0–34.0)
MCHC: 32 g/dL (ref 30.0–36.0)
MCHC: 32.2 g/dL (ref 30.0–36.0)
MCV: 97.1 fL (ref 80.0–100.0)
MCV: 97.6 fL (ref 80.0–100.0)
Platelets: 171 10*3/uL (ref 150–400)
Platelets: 179 10*3/uL (ref 150–400)
RBC: 3.12 MIL/uL — ABNORMAL LOW (ref 3.87–5.11)
RBC: 3.28 MIL/uL — ABNORMAL LOW (ref 3.87–5.11)
RDW: 13.1 % (ref 11.5–15.5)
RDW: 13.3 % (ref 11.5–15.5)
WBC: 10.7 10*3/uL — ABNORMAL HIGH (ref 4.0–10.5)
WBC: 13.1 10*3/uL — ABNORMAL HIGH (ref 4.0–10.5)
nRBC: 0 % (ref 0.0–0.2)
nRBC: 0 % (ref 0.0–0.2)

## 2019-01-29 LAB — HEMOGLOBIN A1C
Hgb A1c MFr Bld: 5.5 % (ref 4.8–5.6)
Mean Plasma Glucose: 111.15 mg/dL

## 2019-01-29 LAB — COMPREHENSIVE METABOLIC PANEL
ALT: 18 U/L (ref 0–44)
AST: 18 U/L (ref 15–41)
Albumin: 3.1 g/dL — ABNORMAL LOW (ref 3.5–5.0)
Alkaline Phosphatase: 63 U/L (ref 38–126)
Anion gap: 9 (ref 5–15)
BUN: 9 mg/dL (ref 8–23)
CO2: 27 mmol/L (ref 22–32)
Calcium: 8.8 mg/dL — ABNORMAL LOW (ref 8.9–10.3)
Chloride: 97 mmol/L — ABNORMAL LOW (ref 98–111)
Creatinine, Ser: 0.75 mg/dL (ref 0.44–1.00)
GFR calc Af Amer: >60 mL/min (ref >60)
GFR calc non Af Amer: >60 mL/min (ref >60)
Glucose, Bld: 110 mg/dL — ABNORMAL HIGH (ref 70–99)
Potassium: 3.2 mmol/L — ABNORMAL LOW (ref 3.5–5.1)
Sodium: 133 mmol/L — ABNORMAL LOW (ref 135–145)
Total Bilirubin: 0.6 mg/dL (ref 0.3–1.2)
Total Protein: 6.1 g/dL — ABNORMAL LOW (ref 6.5–8.1)

## 2019-01-29 MED ORDER — DOCUSATE SODIUM 100 MG PO CAPS: 100.0000 mg | ORAL_CAPSULE | Freq: Two times a day (BID) | ORAL | 0 refills | Status: DC

## 2019-01-29 MED ORDER — CEPHALEXIN 250 MG PO CAPS: ORAL_CAPSULE | ORAL | 0 refills | Status: DC

## 2019-01-29 MED ORDER — POLYETHYLENE GLYCOL 3350 17 G PO PACK: 17.0000 g | PACK | Freq: Every day | ORAL | 0 refills | Status: DC | PRN

## 2019-01-29 MED ORDER — ACETAMINOPHEN 325 MG PO TABS: 650.0000 mg | ORAL_TABLET | ORAL | 2 refills | Status: DC | PRN

## 2019-01-29 MED ORDER — DOCUSATE SODIUM 100 MG PO CAPS
ORAL_CAPSULE | ORAL | Status: AC
  Filled 2019-01-29: qty 1

## 2019-01-29 MED ORDER — ACETAMINOPHEN 500 MG PO TABS
ORAL_TABLET | ORAL | Status: AC
  Filled 2019-01-29: qty 2

## 2019-01-29 MED ORDER — OXYCODONE HCL 5 MG PO TABS: 5.0000 mg | ORAL_TABLET | Freq: Four times a day (QID) | ORAL | 0 refills | Status: DC | PRN

## 2019-01-29 NOTE — Progress Notes (Signed)
Patient's urine output and color were monitored throughout the night. Patient had adequate urine output and urine slowly turned back to yellow color. Patient's vitals remain stable and patient has no pain.

## 2019-01-29 NOTE — Progress Notes (Signed)
Pt incontinent in bed, unable to measure output, did not appear blood tinged, once up to bathroom unable to void. Bladder scan with 340cc volume noted. Pt has taken diuretic this am and drinking water.

## 2019-01-29 NOTE — Progress Notes (Signed)
Pt with several episodes of incontinence, unable to measure. Unable to void when up to bathroom, bladder scan after attempt 490cc. Dr. Joycelyn Rua made aware. Will insert foley catheter and discharge pt home. Will follow up in office Friday for voiding trial.

## 2019-01-29 NOTE — Progress Notes (Addendum)
1 Day Post-Op Procedure(s) (LRB): HYSTERECTOMY VAGINAL WITH LEFT SALPINGECTOMY (Left) ANTERIOR REPAIR (CYSTOCELE) (N/A) CYSTOSCOPY (N/A)  Subjective: Patient reports feeling great this morning. She denies pain (no pain meds x 10 hours), she is ambulating, tolerating po. Her foley was removed at 5 am, she hasn't voided yet.     Objective: I have reviewed patient's vital signs, intake and output and labs.  Last night the patient had gross hematuria in her foley catheter, etiology unclear. It completely cleared overnight.   Today's Vitals   01/28/19 2356 01/29/19 0215 01/29/19 0523 01/29/19 0530  BP: (!) 108/58 115/67 (!) 107/56   Pulse: 86 97 92   Resp: 16 16 18    Temp: (!) 97.5 F (36.4 C)  (!) 97.5 F (36.4 C)   TempSrc:      SpO2: 98% 99% 100%   Weight:      Height:      PainSc:  0-No pain  0-No pain     General: alert, cooperative and no distress Resp: clear to auscultation bilaterally Cardio: irregularly irregular rhythm GI: soft, non-tender; bowel sounds normal; no masses,  no organomegaly Extremities: non tender, 1+ pitting edema in her LLE, trace in the RLE (she reports her swelling is always worse on the left) Vaginal Bleeding: minimal   CBC Latest Ref Rng & Units 01/29/2019 01/28/2019 01/23/2019  WBC 4.0 - 10.5 K/uL 10.7(H) 10.9(H) 6.9  Hemoglobin 12.0 - 15.0 g/dL 9.7(L) 10.9(L) 13.6  Hematocrit 36.0 - 46.0 % 30.3(L) 34.0(L) 42.6  Platelets 150 - 400 K/uL 171 171 268   CMP Latest Ref Rng & Units 01/28/2019 01/23/2019 11/13/2018  Glucose 70 - 99 mg/dL 240(H) 104(H) 99  BUN 8 - 23 mg/dL 10 13 14   Creatinine 0.44 - 1.00 mg/dL 0.84 0.67 0.67  Sodium 135 - 145 mmol/L 136 140 139  Potassium 3.5 - 5.1 mmol/L 3.4(L) 3.5 4.0  Chloride 98 - 111 mmol/L 101 101 100  CO2 22 - 32 mmol/L 27 28 31   Calcium 8.9 - 10.3 mg/dL 8.5(L) 9.5 9.8  Total Protein 6.5 - 8.1 g/dL 6.0(L) 7.0 -  Total Bilirubin 0.3 - 1.2 mg/dL 0.7 0.3 -  Alkaline Phos 38 - 126 U/L 64 83 -  AST 15 - 41 U/L 23  22 -  ALT 0 - 44 U/L 19 27 -    Intake/Output Summary (Last 24 hours) at 01/29/2019 0753 Last data filed at 01/29/2019 0538 Gross per 24 hour  Intake 3052.5 ml  Output 1410 ml  Net 1642.5 ml     Assessment: s/p Procedure(s): HYSTERECTOMY VAGINAL WITH LEFT SALPINGECTOMY (Left) ANTERIOR REPAIR (CYSTOCELE) (N/A) CYSTOSCOPY (N/A): stable, progressing well and tolerating diet  She has not voided yet, h/o urinary retention prior to cystocele repair Episode of gross hematuria overnight, cleared, suspect trauma from the foley. Normal cystoscopy at the end of her surgery yesterday, no stitches in her bladder, strong, bilateral ureteral jets She had a drop in her Hgb overnight, normal vitals, voiding well overnight, clinically looks well without signs of bleeding Elevated glucose last night, no h/o diabetes.   Plan: Will recheck her CBC and CMP later this morning She needs to void prior to D/C, will check bladder scan after. If she is unable to void will d/c home with foley Will check her glucose and hgbA1C SL IV   LOS: 0 days    [name] 01/29/2019, 7:50 AM  Addendum:urine output overnight is not clear. We think she had urine emptied and not recorded at  20:00. At 12 am 100 cc emptied from the foley, at 1 am 100 cc emptied, at 2 am 100 cc emptied at 5 am 300 cc emptied. Foley pulled at 5 am and she hasn't voided yet. So from 12 am until 5 am 600 cc output recorded from her Foley.

## 2019-01-29 NOTE — Progress Notes (Deleted)
GYNECOLOGY  VISIT   HPI: 85 y.o.   Widowed  Caucasian  female   G1P1002 with No LMP recorded. Patient is postmenopausal.   here for surgical consult.   GYNECOLOGIC HISTORY: No LMP recorded. Patient is postmenopausal. Contraception:  Postmenopausal Menopausal hormone therapy:  None Last mammogram: 10-29-18 neg/density B/BiRads1 Last pap smear: ***        OB History    Gravida  1   Para  1   Term  1   Preterm      AB      Living  2     SAB      TAB      Ectopic      Multiple  1   Live Births  2              Patient Active Problem List   Diagnosis Date Noted  . Genital prolapse 01/28/2019  . H/O total vaginal hysterectomy 01/28/2019  . Preop exam for internal medicine 01/23/2019  . Vaginal prolapse 11/13/2018  . Stasis dermatitis of both legs 02/18/2018  . Vitamin B12 deficiency 11/30/2017  . Glaucoma 07/31/2017  . Hearing loss 07/31/2017  . Gait disorder 07/31/2017  . Anxiety 02/01/2017  . Melena 05/05/2015  . Atrial fibrillation ([place]) 03/29/2015  . Elevated hemoglobin ([place]) 11/28/2014  . Pain in joint, ankle and foot 04/28/2014  . PVC's (premature ventricular contractions) 03/02/2014  . Abnormal echocardiogram 02/13/2014  . Chest pain, unspecified 01/30/2014  . Abdominal pain, chronic, epigastric 01/30/2014  . Tachycardia 01/30/2014  . Knee pain 12/18/2013  . Paresthesia 06/25/2013  . Obesity 12/11/2012  . Elevated glucose 12/11/2012  . URI (upper respiratory infection) 09/09/2012  . Vertigo 03/11/2012  . DOE (dyspnea on exertion) 01/26/2012  . Neoplasm of uncertain behavior of skin 04/20/2011  . Ganglion cyst 12/22/2010  . Venous insufficiency of leg 09/21/2010  . Edema 09/21/2010  . SKIN RASH 09/07/2009  . FATTY LIVER DISEASE 12/22/2008  . OSTEOARTHRITIS 12/22/2008  . Cholelithiasis with chronic cholecystitis 07/01/2008  . Cystitis 02/27/2008  . Fatigue 02/27/2008  . ABNORMAL LIVER FUNCTION TESTS 02/27/2008  . Dyslipidemia 08/29/2007   . Disturbance of skin sensation 08/22/2007  . DEFICIENCY, VITAMIN D NOS 02/27/2007  . Essential hypertension 02/27/2007  . LOW BACK PAIN 02/27/2007    Past Medical History:  Diagnosis Date  . Anxiety   . Atrial fibrillation ([place])   . Cystocele with prolapse   . Dislocated shoulder 2012   right  . Edema    left leg  . Fatty liver   . Gait disturbance   . Gallstone    1.5 cm  . Ganglion cyst   . Glaucoma   . HTN (hypertension)   . Hyperlipidemia   . LBP (low back pain)   . Obesity   . Osteoarthritis   . Rosacea   . Shortness of breath    with activities and exertion, pt denies 01/23/2019  . Venous insufficiency of leg   . Vertigo    not current 01/23/2019  . Vitamin B 12 deficiency     Past Surgical History:  Procedure Laterality Date  . CHOLECYSTECTOMY N/A 03/27/2014   Procedure: LAPAROSCOPIC CHOLECYSTECTOMY WITH INTRAOPERATIVE CHOLANGIOGRAM;  Surgeon: Fanny Skates, MD;  Location: [facility];  Service: General;  Laterality: N/A;  . TONSILLECTOMY      No current facility-administered medications for this visit.    Current Outpatient Medications  Medication Sig Dispense Refill  . acetaminophen (TYLENOL) 325 MG tablet Take 2 tablets (  650 mg total) by mouth every 4 (four) hours as needed. 100 tablet 2  . cephALEXin (KEFLEX) 250 MG capsule 1 po qd 7 capsule 0  . docusate sodium (COLACE) 100 MG capsule Take 1 capsule (100 mg total) by mouth 2 (two) times daily. 60 capsule 0  . oxyCODONE (OXY IR/ROXICODONE) 5 MG immediate release tablet Take 1 tablet (5 mg total) by mouth every 6 (six) hours as needed for moderate pain. 20 tablet 0  . polyethylene glycol (MIRALAX / GLYCOLAX) 17 g packet Take 17 g by mouth daily as needed for mild constipation. 14 each 0   Facility-Administered Medications Ordered in Other Visits  Medication Dose Route Frequency Provider Last Rate Last Dose  . acetaminophen (TYLENOL) tablet 1,000 mg  1,000 mg Oral Q6H [name], MD   1,000 mg at  01/29/19 0908  . alum & mag hydroxide-simeth (MAALOX/MYLANTA) 200-200-20 MG/5ML suspension 30 mL  30 mL Oral Q4H PRN [name], MD      . bumetanide [facility]) tablet 0.5 mg  0.5 mg Oral Q breakfast [name], MD   0.5 mg at 01/29/19 0932  . dextrose 5 % and 0.45 % NaCl with KCl 20 mEq/L infusion   Intravenous Continuous [name], MD   Stopped at 01/29/19 [phone]  . docusate sodium (COLACE) capsule 100 mg  100 mg Oral BID [name], MD   100 mg at 01/29/19 0908  . HYDROmorphone (DILAUDID) injection 0.2-0.6 mg  0.2-0.6 mg Intravenous Q2H PRN [name], MD      . ibuprofen (ADVIL) tablet 400 mg  400 mg Oral Q4H PRN [name], MD      . menthol-cetylpyridinium (CEPACOL) lozenge 3 mg  1 lozenge Oral Q2H PRN [name], MD      . ondansetron [facility]) tablet 4 mg  4 mg Oral Q6H PRN [name], MD       Or  . ondansetron [facility]) injection 4 mg  4 mg Intravenous Q6H PRN [name], MD      . oxyCODONE (Oxy IR/ROXICODONE) immediate release tablet 5-10 mg  5-10 mg Oral Q4H PRN [name], MD   5 mg at 01/28/19 2148  . polyethylene glycol (MIRALAX / GLYCOLAX) packet 17 g  17 g Oral Daily PRN [name], MD      . potassium chloride (K-DUR) CR tablet 10 mEq  10 mEq Oral Daily PRN [name], MD      . torsemide [facility]) tablet 20 mg  20 mg Oral Daily [name], MD   20 mg at 01/29/19 0932     ALLERGIES: Eliquis [apixaban], Losartan, Maxzide [hydrochlorothiazide w-triamterene], and Metoprolol  Family History  Problem Relation Age of Onset  . Pancreatic cancer Mother   . Hypertension Mother   . Breast cancer Mother     Social History   Socioeconomic History  . Marital status: Widowed    Spouse name: Not on file  . Number of children: Not on file  . Years of education: Not on file  . Highest education level: Not on file  Occupational History  . Occupation: Retired   Scientific laboratory technician  . Financial resource strain: Not on file  . Food insecurity    Worry: Not on file    Inability: Not on file  . Transportation needs    Medical: Not on file    Non-medical: Not on file  Tobacco Use  . Smoking status:  Never Smoker  . Smokeless tobacco: Never Used  Substance and Sexual Activity  . Alcohol use: No  . Drug use: Never  . Sexual activity: Not Currently    Birth control/protection: Post-menopausal  Lifestyle  . Physical activity    Days per week: Not on file    Minutes per session: Not on file  . Stress: Not on file  Relationships  . Social Herbalist on phone: Not on file    Gets together: Not on file    Attends religious service: Not on file    Active member of club or organization: Not on file    Attends meetings of clubs or organizations: Not on file    Relationship status: Not on file  . Intimate partner violence    Fear of current or ex partner: Not on file    Emotionally abused: Not on file    Physically abused: Not on file    Forced sexual activity: Not on file  Other Topics Concern  . Not on file  Social History Narrative   Not taking vaccines          Review of Systems  PHYSICAL EXAMINATION:    There were no vitals taken for this visit.    General appearance: alert, cooperative and appears stated age Head: Normocephalic, without obvious abnormality, atraumatic Neck: no adenopathy, supple, symmetrical, trachea midline and thyroid normal to inspection and palpation Lungs: clear to auscultation bilaterally Breasts: normal appearance, no masses or tenderness, No nipple retraction or dimpling, No nipple discharge or bleeding, No axillary or supraclavicular adenopathy Heart: regular rate and rhythm Abdomen: soft, non-tender, no masses,  no organomegaly Extremities: extremities normal, atraumatic, no cyanosis or edema Skin: Skin color, texture, turgor normal. No rashes or lesions Lymph nodes: Cervical, supraclavicular, and  axillary nodes normal. No abnormal inguinal nodes palpated Neurologic: Grossly normal  Pelvic: External genitalia:  no lesions              Urethra:  normal appearing urethra with no masses, tenderness or lesions              Bartholins and Skenes: normal                 Vagina: normal appearing vagina with normal color and discharge, no lesions              Cervix: no lesions                Bimanual Exam:  Uterus:  normal size, contour, position, consistency, mobility, non-tender              Adnexa: no mass, fullness, tenderness              Rectal exam: {yes no:314532}.  Confirms.              Anus:  normal sphincter tone, no lesions  Chaperone was present for exam.  ASSESSMENT     PLAN     An After Visit Summary was printed and given to the patient.  ______ minutes face to face time of which over 50% was spent in counseling.

## 2019-01-29 NOTE — Discharge Instructions (Signed)
Vaginal Hysterectomy, Care After Refer to this sheet in the next few weeks. These instructions provide you with information about caring for yourself after your procedure. Your health care provider may also give you more specific instructions. Your treatment has been planned according to current medical practices, but problems sometimes occur. Call your health care provider if you have any problems or questions after your procedure. What can I expect after the procedure? After the procedure, it is common to have:  Pain.  Soreness and numbness in your incision areas.  Vaginal bleeding and discharge.  Constipation.  Temporary problems emptying the bladder.  Feelings of sadness or other emotions. Follow these instructions at home: Medicines  Take over-the-counter and prescription medicines only as told by your health care provider.  If you were prescribed an antibiotic medicine, take it as told by your health care provider. Do not stop taking the antibiotic even if you start to feel better.  Do not drive or operate heavy machinery while taking prescription pain medicine. Activity  Return to your normal activities as told by your health care provider. Ask your health care provider what activities are safe for you.  Get regular exercise as told by your health care provider. You may be told to take short walks every day and go farther each time.  Do not lift anything that is heavier than 10 lb (4.5 kg). General instructions   Do not put anything in your vagina for 6 weeks after your surgery or as told by your health care provider. This includes tampons and douches.  Do not have sex until your health care provider says you can.  Do not take baths, swim, or use a hot tub until your health care provider approves.  Drink enough fluid to keep your urine clear or pale yellow.  Do not drive for 24 hours if you were given a sedative.  Keep all follow-up visits as told by your health  care provider. This is important. Contact a health care provider if:  Your pain medicine is not helping.  You have a fever.  You have redness, swelling, or pain at your incision site.  You have blood, pus, or a bad-smelling discharge from your vagina.  You continue to have difficulty urinating. Get help right away if:  You have severe abdominal or back pain.  You have heavy bleeding from your vagina.  You have chest pain or shortness of breath. This information is not intended to replace advice given to you by your health care provider. Make sure you discuss any questions you have with your health care provider. Document Released: 09/13/2015 Document Revised: 01/13/2016 Document Reviewed: 06/06/2015 Elsevier Patient Education  [place], Adult An indwelling urinary catheter is a thin tube that is put into your bladder. The tube helps to drain pee (urine) out of your body. The tube goes in through your urethra. Your urethra is where pee comes out of your body. Your pee will come out through the catheter, then it will go into a bag (drainage bag). Take good care of your catheter so it will work well. How to wear your catheter and bag Supplies needed  Sticky tape (adhesive tape) or a leg strap.  Alcohol wipe or soap and water (if you use tape).  A clean towel (if you use tape).  Large overnight bag.  Smaller bag (leg bag). Wearing your catheter Attach your catheter to your leg with tape or a leg strap.  Make  sure the catheter is not pulled tight.  If a leg strap gets wet, take it off and put on a dry strap.  If you use tape to hold the bag on your leg: 1. Use an alcohol wipe or soap and water to wash your skin where the tape made it sticky before. 2. Use a clean towel to pat-dry that skin. 3. Use new tape to make the bag stay on your leg. Wearing your bags You should have been given a large overnight bag.  You may wear the  overnight bag in the day or night.  Always have the overnight bag lower than your bladder.  Do not let the bag touch the floor.  Before you go to sleep, put a clean plastic bag in a wastebasket. Then hang the overnight bag inside the wastebasket. You should also have a smaller leg bag that fits under your clothes.  Always wear the leg bag below your knee.  Do not wear your leg bag at night. How to care for your skin and catheter Supplies needed  A clean washcloth.  Water and mild soap.  A clean towel. Caring for your skin and catheter      Clean the skin around your catheter every day: ? Wash your hands with soap and water. ? Wet a clean washcloth in warm water and mild soap. ? Clean the skin around your urethra. ? If you are female: ? Gently spread the folds of skin around your vagina (labia). ? With the washcloth in your other hand, wipe the inner side of your labia on each side. Wipe from front to back. ? If you are female: ? Pull back any skin that covers the end of your penis (foreskin). ? With the washcloth in your other hand, wipe your penis in small circles. Start wiping at the tip of your penis, then move away from the catheter. ? Move the foreskin back in place, if needed. ? With your free hand, hold the catheter close to where it goes into your body. ? Keep holding the catheter during cleaning so it does not get pulled out. ? With the washcloth in your other hand, clean the catheter. ? Only wipe downward on the catheter. ? Do not wipe upward toward your body. Doing this may push germs into your urethra and cause infection. ? Use a clean towel to pat-dry the catheter and the skin around it. Make sure to wipe off all soap. ? Wash your hands with soap and water.  Shower every day. Do not take baths.  Do not use cream, ointment, or lotion on the area where the catheter goes into your body, unless your doctor tells you to.  Do not use powders, sprays, or lotions on  your genital area.  Check your skin around the catheter every day for signs of infection. Check for: ? Redness, swelling, or pain. ? Fluid or blood. ? Warmth. ? Pus or a bad smell. How to empty the bag Supplies needed  Rubbing alcohol.  Gauze pad or cotton ball.  Tape or a leg strap. Emptying the bag Pour the pee out of your bag when it is ?- full, or at least 2-3 times a day. Do this for your overnight bag and your leg bag. 1. Wash your hands with soap and water. 2. Separate (detach) the bag from your leg. 3. Hold the bag over the toilet or a clean pail. Keep the bag lower than your hips and bladder. This is  so the pee (urine) does not go back into the tube. 4. Open the pour spout. It is at the bottom of the bag. 5. Empty the pee into the toilet or pail. Do not let the pour spout touch any surface. 6. Put rubbing alcohol on a gauze pad or cotton ball. 7. Use the gauze pad or cotton ball to clean the pour spout. 8. Close the pour spout. 9. Attach the bag to your leg with tape or a leg strap. 10. Wash your hands with soap and water. Follow instructions for cleaning the drainage bag:  From the product maker.  As told by your doctor. How to change the bag Supplies needed  Alcohol wipes.  A clean bag.  Tape or a leg strap. Changing the bag Replace your bag when it starts to leak, smell bad, or look dirty. 1. Wash your hands with soap and water. 2. Separate the dirty bag from your leg. 3. Pinch the catheter with your fingers so that pee does not spill out. 4. Separate the catheter tube from the bag tube where these tubes connect (at the connection valve). Do not let the tubes touch any surface. 5. Clean the end of the catheter tube with an alcohol wipe. Use a different alcohol wipe to clean the end of the bag tube. 6. Connect the catheter tube to the tube of the clean bag. 7. Attach the clean bag to your leg with tape or a leg strap. Do not make the bag tight on your  leg. 8. Wash your hands with soap and water. General rules   Never pull on your catheter. Never try to take it out. Doing that can hurt you.  Always wash your hands before and after you touch your catheter or bag. Use a mild, fragrance-free soap. If you do not have soap and water, use hand sanitizer.  Always make sure there are no twists or bends (kinks) in the catheter tube.  Always make sure there are no leaks in the catheter or bag.  Drink enough fluid to keep your pee pale yellow.  Do not take baths, swim, or use a hot tub.  If you are female, wipe from front to back after you poop (have a bowel movement). Contact a doctor if:  Your pee is cloudy.  Your pee smells worse than usual.  Your catheter gets clogged.  Your catheter leaks.  Your bladder feels full. Get help right away if:  You have redness, swelling, or pain where the catheter goes into your body.  You have fluid, blood, pus, or a bad smell coming from the area where the catheter goes into your body.  Your skin feels warm where the catheter goes into your body.  You have a fever.  You have pain in your: ? Belly (abdomen). ? Legs. ? Lower back. ? Bladder.  You see blood in the catheter.  Your pee is pink or red.  You feel sick to your stomach (nauseous).  You throw up (vomit).  You have chills.  Your pee is not draining into the bag.  Your catheter gets pulled out. Summary  An indwelling urinary catheter is a thin tube that is placed into the bladder to help drain pee (urine) out of the body.  The catheter is placed into the part of the body that drains pee from the bladder (urethra).  Taking good care of your catheter will keep it working properly and help prevent problems.  Always wash your hands before  and after touching your catheter or bag.  Never pull on your catheter or try to take it out. This information is not intended to replace advice given to you by your health care  provider. Make sure you discuss any questions you have with your health care provider. Document Released: 09/16/2012 Document Revised: 09/13/2018 Document Reviewed: 01/05/2017 Elsevier Patient Education  2020 [employer].

## 2019-01-29 NOTE — Discharge Summary (Signed)
Physician Discharge Summary  Patient ID: Kathleen Mejia MRN: [id number] DOB/AGE: 06-21-1933 85 y.o.  Admit date: 01/28/2019 Discharge date: 01/29/2019  Admission Diagnoses:  Discharge Diagnoses:  Active Problems:   Genital prolapse   H/O total vaginal hysterectomy Cystocele repair, cystoscopy  Discharged Condition: good  Hospital Course: Surgery was uncomplicated. She was admitted overnight. She had some gross hematuria overnight (foley in), this resolved over several hours, suspect irritation from the catheter. The patient was unable to void after removal of the catheter and will be sent home with the catheter in. She has a h/o urinary retention.  Consults: None  Significant Diagnostic Studies: labs:  CBC Latest Ref Rng & Units 01/29/2019 01/29/2019 01/28/2019  WBC 4.0 - 10.5 K/uL 13.1(H) 10.7(H) 10.9(H)  Hemoglobin 12.0 - 15.0 g/dL 10.3(L) 9.7(L) 10.9(L)  Hematocrit 36.0 - 46.0 % 32.0(L) 30.3(L) 34.0(L)  Platelets 150 - 400 K/uL 179 171 171    CMP Latest Ref Rng & Units 01/29/2019 01/28/2019 01/23/2019  Glucose 70 - 99 mg/dL 110(H) 240(H) 104(H)  BUN 8 - 23 mg/dL 9 10 13   Creatinine 0.44 - 1.00 mg/dL 0.75 0.84 0.67  Sodium 135 - 145 mmol/L 133(L) 136 140  Potassium 3.5 - 5.1 mmol/L 3.2(L) 3.4(L) 3.5  Chloride 98 - 111 mmol/L 97(L) 101 101  CO2 22 - 32 mmol/L 27 27 28   Calcium 8.9 - 10.3 mg/dL 8.8(L) 8.5(L) 9.5  Total Protein 6.5 - 8.1 g/dL 6.1(L) 6.0(L) 7.0  Total Bilirubin 0.3 - 1.2 mg/dL 0.6 0.7 0.3  Alkaline Phos 38 - 126 U/L 63 64 83  AST 15 - 41 U/L 18 23 22   ALT 0 - 44 U/L 18 19 27     Treatments: surgery: total vaginal hysterectomy, left salpingectomy, cystocele repair, cystoscopy.  Discharge Exam: Blood pressure 113/61, pulse 90, temperature 98.5 F (36.9 C), resp. rate 18, height 5' (1.524 m), weight 72.3 kg, SpO2 100 %. See exam from morning visit.  I/O last 3 completed shifts: In: 3052.5 [P.O.:990; I.V.:1962.5; IV Piggyback:100] Out: 1410 [Urine:1260;  Blood:150] Total I/O In: 120 [P.O.:120] Out: -      Disposition: Discharge disposition: 01-Home or Self Care       Discharge Instructions    Call MD for:   Complete by: As directed    Heavy vaginal bleeding   Call MD for:  difficulty breathing, headache or visual disturbances   Complete by: As directed    Call MD for:  extreme fatigue   Complete by: As directed    Call MD for:  hives   Complete by: As directed    Call MD for:  persistant dizziness or light-headedness   Complete by: As directed    Call MD for:  persistant nausea and vomiting   Complete by: As directed    Call MD for:  severe uncontrolled pain   Complete by: As directed    Call MD for:  temperature >100.4   Complete by: As directed    Diet - low sodium heart healthy   Complete by: As directed    Discharge instructions   Complete by: As directed    Follow up on Friday at 11:30 am to see Dr Quincy Simmonds (Dr Gentry Fitz partner)   Driving Restrictions   Complete by: As directed    No driving while on narcotics   Increase activity slowly   Complete by: As directed    May shower / Bathe   Complete by: As directed    May shower, no tub bath  Sexual Activity Restrictions   Complete by: As directed    No intercourse     Allergies as of 01/29/2019      Reactions   Eliquis [apixaban]    HAs, achy   Losartan    "all side effects and an article from [employer] re Valsartan recall"   Maxzide [hydrochlorothiazide W-triamterene]    Cramps w/a high dose   Metoprolol    achy      Medication List    TAKE these medications   acetaminophen 325 MG tablet Commonly known as: Tylenol Take 2 tablets (650 mg total) by mouth every 4 (four) hours as needed.   aspirin 81 MG tablet Take 1 tablet (81 mg total) daily by mouth.   bumetanide 0.5 MG tablet Commonly known as: BUMEX Take 1 tablet (0.5 mg total) by mouth daily with breakfast.   cephALEXin 250 MG capsule Commonly known as: KEFLEX 1 po qd   CO Q 10 PO Take  250 mg by mouth daily.   Cod Liver Oil 1000 MG Caps Take 1,000 mg by mouth daily.   CRANBERRY PO Take 1 capsule by mouth daily.   docusate sodium 100 MG capsule Commonly known as: COLACE Take 1 capsule (100 mg total) by mouth 2 (two) times daily. What changed: when to take this   FLAXSEED (LINSEED) PO Take 0.5 Sco[place] by mouth daily. PATIENT SPRINKLE FLAX SEEDS ONTO HER CEREAL DAILY   Garlic 628 MG Caps Take 500 mg by mouth daily.   MULTI-MINERALS PO Take 1 tablet by mouth daily.   oxyCODONE 5 MG immediate release tablet Commonly known as: Oxy IR/ROXICODONE Take 1 tablet (5 mg total) by mouth every 6 (six) hours as needed for moderate pain.   polyethylene glycol 17 g packet Commonly known as: MIRALAX / GLYCOLAX Take 17 g by mouth daily as needed for mild constipation. What changed:   when to take this  reasons to take this   potassium chloride 10 MEQ tablet Commonly known as: K-DUR Take 1 tablet (10 mEq total) by mouth daily as needed (take with furosemide).   torsemide 20 MG tablet Commonly known as: DEMADEX Take 1 tablet (20 mg total) by mouth daily.   vitamin C 1000 MG tablet Take 1,000 mg by mouth daily.   ZINC 15 PO Take 50 mg by mouth daily.        Signed: [name] 01/29/2019, 12:37 PM

## 2019-01-30 ENCOUNTER — Telehealth: Payer: Self-pay | Admitting: Obstetrics and Gynecology

## 2019-01-30 ENCOUNTER — Ambulatory Visit: Payer: Self-pay | Admitting: Obstetrics and Gynecology

## 2019-01-30 LAB — URINE CULTURE: Culture: NO GROWTH

## 2019-01-30 NOTE — Telephone Encounter (Signed)
Called patient to check on her, she is doing well, no pain from the surgery, just bothered by the catheter. We discussed her seeing Dr Quincy Simmonds tomorrow, that her bladder will be filled with fluid, the catheter will be removed to see if she can void. Further plans depending on her ability to void.

## 2019-01-30 NOTE — Progress Notes (Signed)
GYNECOLOGY  VISIT   HPI: 85 y.o.   Widowed  Caucasian  female   [id number] with No LMP recorded. Patient is postmenopausal.   here for 3 day status post HYSTERECTOMY VAGINAL WITH LEFT SALPINGECTOMY (Left Vagina ) ANTERIOR REPAIR (CYSTOCELE) (N/A Vagina ) CYSTOSCOPY (N/A Bladder).  Patient states she is doing well overall but is quite nervous because of the catheter. She really wants to have her Foley catheter removed.  Minimal vaginal bleeding.  No BM yet.     GYNECOLOGIC HISTORY: No LMP recorded. Patient is postmenopausal. Contraception:  Postmenopausal Menopausal hormone therapy:  none Last mammogram:  10/29/2018 Birads 1 negative Last pap smear: Unsure        OB History    Gravida  1   Para  1   Term  1   Preterm      AB      Living  2     SAB      TAB      Ectopic      Multiple  1   Live Births  2              Patient Active Problem List   Diagnosis Date Noted  . Genital prolapse 01/28/2019  . H/O total vaginal hysterectomy 01/28/2019  . Preop exam for internal medicine 01/23/2019  . Vaginal prolapse 11/13/2018  . Stasis dermatitis of both legs 02/18/2018  . Vitamin B12 deficiency 11/30/2017  . Glaucoma 07/31/2017  . Hearing loss 07/31/2017  . Gait disorder 07/31/2017  . Anxiety 02/01/2017  . Melena 05/05/2015  . Atrial fibrillation ([place]) 03/29/2015  . Elevated hemoglobin ([place]) 11/28/2014  . Pain in joint, ankle and foot 04/28/2014  . PVC's (premature ventricular contractions) 03/02/2014  . Abnormal echocardiogram 02/13/2014  . Chest pain, unspecified 01/30/2014  . Abdominal pain, chronic, epigastric 01/30/2014  . Tachycardia 01/30/2014  . Knee pain 12/18/2013  . Paresthesia 06/25/2013  . Obesity 12/11/2012  . Elevated glucose 12/11/2012  . URI (upper respiratory infection) 09/09/2012  . Vertigo 03/11/2012  . DOE (dyspnea on exertion) 01/26/2012  . Neoplasm of uncertain behavior of skin 04/20/2011  . Ganglion cyst 12/22/2010  . Venous  insufficiency of leg 09/21/2010  . Edema 09/21/2010  . SKIN RASH 09/07/2009  . FATTY LIVER DISEASE 12/22/2008  . OSTEOARTHRITIS 12/22/2008  . Cholelithiasis with chronic cholecystitis 07/01/2008  . Cystitis 02/27/2008  . Fatigue 02/27/2008  . ABNORMAL LIVER FUNCTION TESTS 02/27/2008  . Dyslipidemia 08/29/2007  . Disturbance of skin sensation 08/22/2007  . DEFICIENCY, VITAMIN D NOS 02/27/2007  . Essential hypertension 02/27/2007  . LOW BACK PAIN 02/27/2007    Past Medical History:  Diagnosis Date  . Anxiety   . Atrial fibrillation ([place])   . Cystocele with prolapse   . Dislocated shoulder 2012   right  . Edema    left leg  . Fatty liver   . Gait disturbance   . Gallstone    1.5 cm  . Ganglion cyst   . Glaucoma   . HTN (hypertension)   . Hyperlipidemia   . LBP (low back pain)   . Obesity   . Osteoarthritis   . Rosacea   . Shortness of breath    with activities and exertion, pt denies 01/23/2019  . Venous insufficiency of leg   . Vertigo    not current 01/23/2019  . Vitamin B 12 deficiency     Past Surgical History:  Procedure Laterality Date  . CHOLECYSTECTOMY N/A 03/27/2014   Procedure:  LAPAROSCOPIC CHOLECYSTECTOMY WITH INTRAOPERATIVE CHOLANGIOGRAM;  Surgeon: Fanny Skates, MD;  Location: [place];  Service: General;  Laterality: N/A;  . CYSTOCELE REPAIR N/A 01/28/2019   Procedure: ANTERIOR REPAIR (CYSTOCELE);  Surgeon: [name], MD;  Location: [facility];  Service: Gynecology;  Laterality: N/A;  . CYSTOSCOPY N/A 01/28/2019   Procedure: CYSTOSCOPY;  Surgeon: [name], MD;  Location: [facility];  Service: Gynecology;  Laterality: N/A;  . TONSILLECTOMY    . VAGINAL HYSTERECTOMY Left 01/28/2019   Procedure: HYSTERECTOMY VAGINAL WITH LEFT SALPINGECTOMY;  Surgeon: [name], MD;  Location: [facility];  Service: Gynecology;  Laterality: Left;    Current Outpatient Medications  Medication Sig  Dispense Refill  . acetaminophen (TYLENOL) 325 MG tablet Take 2 tablets (650 mg total) by mouth every 4 (four) hours as needed. 100 tablet 2  . Ascorbic Acid (VITAMIN C) 1000 MG tablet Take 1,000 mg by mouth daily.     [name] aspirin 81 MG tablet Take 1 tablet (81 mg total) daily by mouth. 30 tablet 11  . bumetanide (BUMEX) 0.5 MG tablet Take 1 tablet (0.5 mg total) by mouth daily with breakfast. 90 tablet 3  . cephALEXin (KEFLEX) 250 MG capsule 1 po qd 7 capsule 0  . Cod Liver Oil 1000 MG CAPS Take 1,000 mg by mouth daily.     . Coenzyme Q10 (CO Q 10 PO) Take 250 mg by mouth daily.     [name] CRANBERRY PO Take 1 capsule by mouth daily.     [name] docusate sodium (COLACE) 100 MG capsule Take 1 capsule (100 mg total) by mouth 2 (two) times daily. 60 capsule 0  . FLAXSEED, LINSEED, PO Take 0.5 Scoops by mouth daily. PATIENT SPRINKLE FLAX SEEDS ONTO HER CEREAL DAILY     . Garlic 275 MG CAPS Take 500 mg by mouth daily.     . Multiple Minerals (MULTI-MINERALS PO) Take 1 tablet by mouth daily.    [name] oxyCODONE (OXY IR/ROXICODONE) 5 MG immediate release tablet Take 1 tablet (5 mg total) by mouth every 6 (six) hours as needed for moderate pain. 20 tablet 0  . polyethylene glycol (MIRALAX / GLYCOLAX) 17 g packet Take 17 g by mouth daily as needed for mild constipation. 14 each 0  . potassium chloride (K-DUR) 10 MEQ tablet Take 1 tablet (10 mEq total) by mouth daily as needed (take with furosemide). 90 tablet 3  . torsemide (DEMADEX) 20 MG tablet Take 1 tablet (20 mg total) by mouth daily. 90 tablet 3  . Zinc Sulfate (ZINC 15 PO) Take 50 mg by mouth daily.      No current facility-administered medications for this visit.      ALLERGIES: Eliquis [apixaban], Losartan, Maxzide [hydrochlorothiazide w-triamterene], and Metoprolol  Family History  Problem Relation Age of Onset  . Pancreatic cancer Mother   . Hypertension Mother   . Breast cancer Mother     Social History   Socioeconomic History  . Marital status:  Widowed    Spouse name: Not on file  . Number of children: Not on file  . Years of education: Not on file  . Highest education level: Not on file  Occupational History  . Occupation: Retired  Scientific laboratory technician  . Financial resource strain: Not on file  . Food insecurity    Worry: Not on file    Inability: Not on file  . Transportation needs    Medical: Not on file  Non-medical: Not on file  Tobacco Use  . Smoking status: Never Smoker  . Smokeless tobacco: Never Used  Substance and Sexual Activity  . Alcohol use: No  . Drug use: Never  . Sexual activity: Not Currently    Birth control/protection: Post-menopausal  Lifestyle  . Physical activity    Days per week: Not on file    Minutes per session: Not on file  . Stress: Not on file  Relationships  . Social Herbalist on phone: Not on file    Gets together: Not on file    Attends religious service: Not on file    Active member of club or organization: Not on file    Attends meetings of clubs or organizations: Not on file    Relationship status: Not on file  . Intimate partner violence    Fear of current or ex partner: Not on file    Emotionally abused: Not on file    Physically abused: Not on file    Forced sexual activity: Not on file  Other Topics Concern  . Not on file  Social History Narrative   Not taking vaccines          Review of Systems  Constitutional: Negative.   HENT: Negative.   Eyes: Negative.   Respiratory: Negative.   Cardiovascular: Negative.   Gastrointestinal: Negative.   Endocrine: Negative.   Genitourinary: Negative.   Musculoskeletal: Negative.   Skin: Negative.   Allergic/Immunologic: Negative.   Neurological: Negative.   Hematological: Negative.   Psychiatric/Behavioral: Negative.     PHYSICAL EXAMINATION:    BP 106/70 (BP Location: Right Arm, Patient Position: Sitting, Cuff Size: Normal)   Pulse 76   Temp (!) 97.2 F (36.2 C) (Temporal)   Ht 5\' 1"  (1.549 m)   Wt 163  lb (73.9 kg)   BMI 30.80 kg/m     General appearance: alert, cooperative and appears stated age  Bladder filling: Filled with 230 cc sterile NS.  Void 190 cc.   Chaperone was present for exam.  ASSESSMENT  Status post TVH and anterior colporrhaphy.  Urinary retention post op.  Resolved.   PLAN  We discussed her taking Miralax and Colace. She will hydrate well.  She will put herself on a voiding schedule every 3 hours and call me if she is unable to void during the weekend.  Keep appointment with Dr. Talbert Nan next week.    An After Visit Summary was printed and given to the patient.

## 2019-01-31 ENCOUNTER — Other Ambulatory Visit: Payer: Self-pay

## 2019-01-31 ENCOUNTER — Encounter: Payer: Self-pay | Admitting: Obstetrics and Gynecology

## 2019-01-31 ENCOUNTER — Ambulatory Visit (INDEPENDENT_AMBULATORY_CARE_PROVIDER_SITE_OTHER): Payer: Medicare Other | Admitting: Obstetrics and Gynecology

## 2019-01-31 VITALS — BP 106/70 | HR 76 | Temp 97.2°F | Ht 61.0 in | Wt 163.0 lb

## 2019-01-31 DIAGNOSIS — R339 Retention of urine, unspecified: Secondary | ICD-10-CM

## 2019-02-01 ENCOUNTER — Telehealth: Payer: Self-pay | Admitting: Obstetrics and Gynecology

## 2019-02-01 LAB — TYPE AND SCREEN
ABO/RH(D): B POS
Antibody Screen: POSITIVE
Unit division: 0
Unit division: 0

## 2019-02-01 LAB — BPAM RBC
Blood Product Expiration Date: 202009242359
Blood Product Expiration Date: 202009242359
Unit Type and Rh: 7300
Unit Type and Rh: 7300

## 2019-02-01 NOTE — Telephone Encounter (Signed)
Phone call returned from her son to review her post op care.   He is stating he is so pleased her catheter is removed.   She has good post op pain control.  Not a large void this am but has not had a lot to drink.  Appetite is good.  We reviewed her medications post op.  He is already guiding her with this.   Her son will be staying with her.   I invited him to call back if he needed further assistance during the weekend.

## 2019-02-01 NOTE — Telephone Encounter (Signed)
Phone call returned to patient during the weekend.  Patient is calling and asking what medications Dr. Talbert Nan told her to stop taking when she called her the other day on the phone after discharge.   States she is getting flustered and has not taken any of her medication today.   I asked her to locate her paper discharge instructions from her recent hospitalization and follow theses instructions.   She is voiding well.  She had her first BM since this am since surgery.  Patient is requiring a lot of direction for her post op care. I spent 26 minutes on the phone reviewing her medications.  She states she has understanding now. I did request that her son call me back to review these with him as well.

## 2019-02-03 ENCOUNTER — Telehealth: Payer: Self-pay | Admitting: Obstetrics and Gynecology

## 2019-02-03 NOTE — Telephone Encounter (Signed)
Patient does not remember which medication Dr. Talbert Nan told her to stop taking.

## 2019-02-03 NOTE — Telephone Encounter (Signed)
Patient returned call. Message given to patient as seen below from Dr. Talbert Nan. Patient verbalized understanding. Patient has appointment schedule tomorrow 02-04-2019 at 1600. Patient agreeable.   Routing to provider and will close encounter.

## 2019-02-03 NOTE — Telephone Encounter (Signed)
Message left to return call to Triage Nurse at [phone].    

## 2019-02-03 NOTE — Telephone Encounter (Signed)
The antibiotic, keflex (cephalexin)

## 2019-02-03 NOTE — Telephone Encounter (Signed)
Reviewed OV note from 01-31-2019, hospital discharge summary and telephone call from the weekend. Please advise which medication patient was to stop.   Routing to provider for review.   Cc Dr. Quincy Simmonds

## 2019-02-04 ENCOUNTER — Emergency Department (HOSPITAL_COMMUNITY)
Admission: EM | Admit: 2019-02-04 | Discharge: 2019-02-05 | Disposition: A | Discharge status: Home / Self Care | Payer: Medicare Other | Attending: Emergency Medicine | Admitting: Emergency Medicine

## 2019-02-04 ENCOUNTER — Encounter: Payer: Self-pay | Admitting: Obstetrics and Gynecology

## 2019-02-04 ENCOUNTER — Telehealth: Payer: Self-pay | Admitting: Obstetrics and Gynecology

## 2019-02-04 ENCOUNTER — Ambulatory Visit: Payer: Medicare Other | Admitting: Obstetrics and Gynecology

## 2019-02-04 ENCOUNTER — Encounter (HOSPITAL_COMMUNITY): Payer: Self-pay | Admitting: Emergency Medicine

## 2019-02-04 ENCOUNTER — Other Ambulatory Visit: Payer: Self-pay

## 2019-02-04 DIAGNOSIS — N3001 Acute cystitis with hematuria: Secondary | ICD-10-CM | POA: Insufficient documentation

## 2019-02-04 DIAGNOSIS — I10 Essential (primary) hypertension: Secondary | ICD-10-CM | POA: Diagnosis not present

## 2019-02-04 DIAGNOSIS — E876 Hypokalemia: Secondary | ICD-10-CM | POA: Insufficient documentation

## 2019-02-04 DIAGNOSIS — Z79899 Other long term (current) drug therapy: Secondary | ICD-10-CM | POA: Diagnosis not present

## 2019-02-04 DIAGNOSIS — R6883 Chills (without fever): Secondary | ICD-10-CM | POA: Diagnosis present

## 2019-02-04 DIAGNOSIS — I4891 Unspecified atrial fibrillation: Secondary | ICD-10-CM | POA: Diagnosis not present

## 2019-02-04 DIAGNOSIS — Z7982 Long term (current) use of aspirin: Secondary | ICD-10-CM | POA: Diagnosis not present

## 2019-02-04 LAB — URINALYSIS, ROUTINE W REFLEX MICROSCOPIC
Bilirubin Urine: NEGATIVE
Glucose, UA: NEGATIVE mg/dL
Ketones, ur: 20 mg/dL — AB
Nitrite: NEGATIVE
Protein, ur: 30 mg/dL — AB
Specific Gravity, Urine: 1.009 (ref 1.005–1.030)
WBC, UA: >50 WBC/hpf — ABNORMAL HIGH (ref 0–5)
pH: 6 (ref 5.0–8.0)

## 2019-02-04 LAB — CBC WITH DIFFERENTIAL/PLATELET
Abs Immature Granulocytes: 0.05 10*3/uL (ref 0.00–0.07)
Basophils Absolute: 0 10*3/uL (ref 0.0–0.1)
Basophils Relative: 0 %
Eosinophils Absolute: 0 10*3/uL (ref 0.0–0.5)
Eosinophils Relative: 0 %
HCT: 30.9 % — ABNORMAL LOW (ref 36.0–46.0)
Hemoglobin: 10.2 g/dL — ABNORMAL LOW (ref 12.0–15.0)
Immature Granulocytes: 0 %
Lymphocytes Relative: 8 %
Lymphs Abs: 1.1 10*3/uL (ref 0.7–4.0)
MCH: 31.7 pg (ref 26.0–34.0)
MCHC: 33 g/dL (ref 30.0–36.0)
MCV: 96 fL (ref 80.0–100.0)
Monocytes Absolute: 1.1 10*3/uL — ABNORMAL HIGH (ref 0.1–1.0)
Monocytes Relative: 9 %
Neutro Abs: 10.5 10*3/uL — ABNORMAL HIGH (ref 1.7–7.7)
Neutrophils Relative %: 83 %
Platelets: 251 10*3/uL (ref 150–400)
RBC: 3.22 MIL/uL — ABNORMAL LOW (ref 3.87–5.11)
RDW: 13.5 % (ref 11.5–15.5)
WBC: 12.7 10*3/uL — ABNORMAL HIGH (ref 4.0–10.5)
nRBC: 0 % (ref 0.0–0.2)

## 2019-02-04 LAB — BASIC METABOLIC PANEL
Anion gap: 13 (ref 5–15)
BUN: 11 mg/dL (ref 8–23)
CO2: 30 mmol/L (ref 22–32)
Calcium: 8.8 mg/dL — ABNORMAL LOW (ref 8.9–10.3)
Chloride: 92 mmol/L — ABNORMAL LOW (ref 98–111)
Creatinine, Ser: 0.69 mg/dL (ref 0.44–1.00)
GFR calc Af Amer: >60 mL/min (ref >60)
GFR calc non Af Amer: >60 mL/min (ref >60)
Glucose, Bld: 128 mg/dL — ABNORMAL HIGH (ref 70–99)
Potassium: 2.7 mmol/L — CL (ref 3.5–5.1)
Sodium: 135 mmol/L (ref 135–145)

## 2019-02-04 MED ORDER — CEPHALEXIN 500 MG PO CAPS: 500.0000 mg | ORAL_CAPSULE | Freq: Four times a day (QID) | ORAL | 0 refills | Status: DC

## 2019-02-04 MED ORDER — POTASSIUM CHLORIDE 10 MEQ/100ML IV SOLN
10.0000 meq | INTRAVENOUS | Status: AC
  Administered 2019-02-04 (×2): 10 meq via INTRAVENOUS
  Filled 2019-02-04 (×2): qty 100

## 2019-02-04 MED ORDER — SODIUM CHLORIDE 0.9 % IV SOLN
1.0000 g | Freq: Once | INTRAVENOUS | Status: AC
  Administered 2019-02-04: 22:00:00 1 g via INTRAVENOUS
  Filled 2019-02-04: qty 10

## 2019-02-04 MED ORDER — POTASSIUM CHLORIDE CRYS ER 20 MEQ PO TBCR
40.0000 meq | EXTENDED_RELEASE_TABLET | Freq: Once | ORAL | Status: AC
  Administered 2019-02-04: 40 meq via ORAL
  Filled 2019-02-04: qty 2

## 2019-02-04 NOTE — ED Notes (Addendum)
Patient encouraged to void, patient stated that she probably would not be able to because she peed at 3:30 pm. She said she would be willing to try the in and out cath.

## 2019-02-04 NOTE — ED Notes (Signed)
Date and time results received: 02/04/19 1914   Test: Potassium  Critical Value: 2.7   Name of Provider Notified: J.Knapp MD  Orders Received? Or Actions Taken?: MD made aware.

## 2019-02-04 NOTE — Telephone Encounter (Signed)
Spoke with patient. Patient states that she is having chills, shaking, feels warm, and is very weak. Is unable to take her temperature as she cannot find her thermometer. Denies any nausea, vomiting, or respiratory symptoms. Does not feel she can make it to her car to come to her appointment today. Advised will speak with Dr.Jertson and return call.

## 2019-02-04 NOTE — Telephone Encounter (Signed)
Patient is calling regarding her appointment this afternoon. Patient stated that she has chills and "feels very weak." Call transferred to [facility] [place], [place].

## 2019-02-04 NOTE — Progress Notes (Unsigned)
GYNECOLOGY  VISIT   HPI: 85 y.o.   Widowed White or Caucasian Not Hispanic or Latino  female   [phone] with No LMP recorded. Patient is postmenopausal.   The patient was supposed to come to the office today for a post op visit, but called c/o feeling weak and having chills since yesterday, doesn't know if she has had a fever. She was sent to the ER. I came to the ER to see her and review her pathology report with her. The patient is one week s/p total vaginal hysterectomy, left salpingectomy (left ovary and right adnexa couldn't be reached), cystocele repair and cystoscopy. She had post op urinary retention and needed a foley for 3 days post op. She had a voiding trial in the office last Friday and was able to void.  She reports voiding better since surgery than prior and leaking less. She does have dysuria.  She is eating and denies abdominal pain, nausea, emesis or diarrhea. She has had a BM.   Incidental finding on her pathology report of endometrial cancer. Uterus and cervix, left fallopian tube - UTERUS: -ENDO/MYOMETRIUM: INVASIVE ENDOMETRIOID ADENOCARCINOMA (FIGO GRADE I). TUMOR INVADES LESS THAN ONE HALF OF THE MYOMETRIUM. SEE ONCOLOGY TABLE. LEIOMYOMATA. -SEROSA: UNREMARKABLE. NO MALIGNANCY. - CERVIX: BENIGN SQUAMOUS AND ENDOCERVICAL MUCOSA. NO DYSPLASIA OR MALIGNANCY. - LEFT FALLOPIAN TUBE: UNREMARKABLE. NO MALIGNANCY. Microscopic Comment UTERUS, CARCINOMA OR CARCINOSARCOMA Procedure: Hysterectomy with left salpingectomy. Histologic type: Endometrioid adenocarcinoma. Histologic Grade: FIGO grade I Myometrial invasion: Depth of invasion: 3 mm Myometrial thickness: 10 mm Uterine Serosa Involvement: Not identified. Cervical stromal involvement: Not identified. Extent of involvement of other organs: Uninvolved. Lymphovascular invasion: Not identified. Regional Lymph Nodes: No lymph nodes available for examination. Tumor block for ancillary studies: Can be performed upon  request. MMR / MSI testing: Will be ordered. Pathologic Stage Classification (pTNM, AJCC 8th edition): pT1a, pNX FIGO Stage: IA Representative Tumor Block: 1D, 58F (v4.1.0.0)      GYNECOLOGIC HISTORY: No LMP recorded. Patient is postmenopausal. Menopausal hormone therapy: none        OB History    Gravida  1   Para  1   Term  1   Preterm      AB      Living  2     SAB      TAB      Ectopic      Multiple  1   Live Births  2              Patient Active Problem List   Diagnosis Date Noted  . Genital prolapse 01/28/2019  . H/O total vaginal hysterectomy 01/28/2019  . Preop exam for internal medicine 01/23/2019  . Vaginal prolapse 11/13/2018  . Stasis dermatitis of both legs 02/18/2018  . Vitamin B12 deficiency 11/30/2017  . Glaucoma 07/31/2017  . Hearing loss 07/31/2017  . Gait disorder 07/31/2017  . Anxiety 02/01/2017  . Melena 05/05/2015  . Atrial fibrillation ([place]) 03/29/2015  . Elevated hemoglobin ([place]) 11/28/2014  . Pain in joint, ankle and foot 04/28/2014  . PVC's (premature ventricular contractions) 03/02/2014  . Abnormal echocardiogram 02/13/2014  . Chest pain, unspecified 01/30/2014  . Abdominal pain, chronic, epigastric 01/30/2014  . Tachycardia 01/30/2014  . Knee pain 12/18/2013  . Paresthesia 06/25/2013  . Obesity 12/11/2012  . Elevated glucose 12/11/2012  . URI (upper respiratory infection) 09/09/2012  . Vertigo 03/11/2012  . DOE (dyspnea on exertion) 01/26/2012  . Neoplasm of uncertain behavior of skin 04/20/2011  .  Ganglion cyst 12/22/2010  . Venous insufficiency of leg 09/21/2010  . Edema 09/21/2010  . SKIN RASH 09/07/2009  . FATTY LIVER DISEASE 12/22/2008  . OSTEOARTHRITIS 12/22/2008  . Cholelithiasis with chronic cholecystitis 07/01/2008  . Cystitis 02/27/2008  . Fatigue 02/27/2008  . ABNORMAL LIVER FUNCTION TESTS 02/27/2008  . Dyslipidemia 08/29/2007  . Disturbance of skin sensation 08/22/2007  . DEFICIENCY, VITAMIN D  NOS 02/27/2007  . Essential hypertension 02/27/2007  . LOW BACK PAIN 02/27/2007    Past Medical History:  Diagnosis Date  . Anxiety   . Atrial fibrillation ([place])   . Cystocele with prolapse   . Dislocated shoulder 2012   right  . Edema    left leg  . Fatty liver   . Gait disturbance   . Gallstone    1.5 cm  . Ganglion cyst   . Glaucoma   . HTN (hypertension)   . Hyperlipidemia   . LBP (low back pain)   . Obesity   . Osteoarthritis   . Rosacea   . Shortness of breath    with activities and exertion, pt denies 01/23/2019  . Venous insufficiency of leg   . Vertigo    not current 01/23/2019  . Vitamin B 12 deficiency     Past Surgical History:  Procedure Laterality Date  . CHOLECYSTECTOMY N/A 03/27/2014   Procedure: LAPAROSCOPIC CHOLECYSTECTOMY WITH INTRAOPERATIVE CHOLANGIOGRAM;  Surgeon: Fanny Skates, MD;  Location: [place];  Service: General;  Laterality: N/A;  . CYSTOCELE REPAIR N/A 01/28/2019   Procedure: ANTERIOR REPAIR (CYSTOCELE);  Surgeon: [name], MD;  Location: [facility];  Service: Gynecology;  Laterality: N/A;  . CYSTOSCOPY N/A 01/28/2019   Procedure: CYSTOSCOPY;  Surgeon: [name], MD;  Location: [facility];  Service: Gynecology;  Laterality: N/A;  . TONSILLECTOMY    . VAGINAL HYSTERECTOMY Left 01/28/2019   Procedure: HYSTERECTOMY VAGINAL WITH LEFT SALPINGECTOMY;  Surgeon: [name], MD;  Location: [facility];  Service: Gynecology;  Laterality: Left;    Current Outpatient Medications  Medication Sig Dispense Refill  . acetaminophen (TYLENOL) 325 MG tablet Take 2 tablets (650 mg total) by mouth every 4 (four) hours as needed. 100 tablet 2  . Ascorbic Acid (VITAMIN C) 1000 MG tablet Take 1,000 mg by mouth daily.     [name] aspirin 81 MG tablet Take 1 tablet (81 mg total) daily by mouth. 30 tablet 11  . bumetanide (BUMEX) 0.5 MG tablet Take 1 tablet (0.5 mg total) by mouth daily with  breakfast. 90 tablet 3  . cephALEXin (KEFLEX) 250 MG capsule 1 po qd 7 capsule 0  . Cod Liver Oil 1000 MG CAPS Take 1,000 mg by mouth daily.     . Coenzyme Q10 (CO Q 10 PO) Take 250 mg by mouth daily.     [name] CRANBERRY PO Take 1 capsule by mouth daily.     [name] docusate sodium (COLACE) 100 MG capsule Take 1 capsule (100 mg total) by mouth 2 (two) times daily. 60 capsule 0  . FLAXSEED, LINSEED, PO Take 0.5 Scoops by mouth daily. PATIENT SPRINKLE FLAX SEEDS ONTO HER CEREAL DAILY     . Garlic 818 MG CAPS Take 500 mg by mouth daily.     . Multiple Minerals (MULTI-MINERALS PO) Take 1 tablet by mouth daily.    [name] oxyCODONE (OXY IR/ROXICODONE) 5 MG immediate release tablet Take 1 tablet (5 mg total) by mouth every 6 (six) hours as needed for moderate  pain. 20 tablet 0  . polyethylene glycol (MIRALAX / GLYCOLAX) 17 g packet Take 17 g by mouth daily as needed for mild constipation. 14 each 0  . potassium chloride (K-DUR) 10 MEQ tablet Take 1 tablet (10 mEq total) by mouth daily as needed (take with furosemide). 90 tablet 3  . torsemide (DEMADEX) 20 MG tablet Take 1 tablet (20 mg total) by mouth daily. 90 tablet 3  . Zinc Sulfate (ZINC 15 PO) Take 50 mg by mouth daily.      No current facility-administered medications for this visit.      ALLERGIES: Eliquis [apixaban], Losartan, Maxzide [hydrochlorothiazide w-triamterene], and Metoprolol  Family History  Problem Relation Age of Onset  . Pancreatic cancer Mother   . Hypertension Mother   . Breast cancer Mother     Social History   Socioeconomic History  . Marital status: Widowed    Spouse name: Not on file  . Number of children: Not on file  . Years of education: Not on file  . Highest education level: Not on file  Occupational History  . Occupation: Retired  Scientific laboratory technician  . Financial resource strain: Not on file  . Food insecurity    Worry: Not on file    Inability: Not on file  . Transportation needs    Medical: Not on file     Non-medical: Not on file  Tobacco Use  . Smoking status: Never Smoker  . Smokeless tobacco: Never Used  Substance and Sexual Activity  . Alcohol use: No  . Drug use: Never  . Sexual activity: Not Currently    Birth control/protection: Post-menopausal  Lifestyle  . Physical activity    Days per week: Not on file    Minutes per session: Not on file  . Stress: Not on file  Relationships  . Social Herbalist on phone: Not on file    Gets together: Not on file    Attends religious service: Not on file    Active member of club or organization: Not on file    Attends meetings of clubs or organizations: Not on file    Relationship status: Not on file  . Intimate partner violence    Fear of current or ex partner: Not on file    Emotionally abused: Not on file    Physically abused: Not on file    Forced sexual activity: Not on file  Other Topics Concern  . Not on file  Social History Narrative   Not taking vaccines          ROS: see above  PHYSICAL EXAMINATION:   T: 99.5, Pulse 100, O2 sat 100%  There were no vitals taken for this visit.    General appearance: alert, cooperative and appears stated age Heart: irregularly rate c/w a.fib Lungs: CTAB Abdomen: soft, non-tender; bowel sounds normal; no masses,  no organomegaly Extremities: normal, atraumatic, no cyanosis Skin: normal color, texture and turgor, no rashes or lesions Lymph: normal cervical supraclavicular and inguinal nodes Neurologic: grossly normal   Pelvic: External genitalia:  no lesions              Urethra:  normal appearing urethra with no masses, tenderness or lesions              Bartholins and Skenes: normal                 Vagina:gentle vaginal exam with one finger, patient not tender with palpation of her anterior  vaginal wall or vaginal cuff.  Chaperone was present for exam.  ASSESSMENT 1 week post op s/p TLH/cystocele repair, now feeling week and with chills. No focal findings on exam  (other than irregular heart rate, known a.fib). Normal abdominal exam, cuff not tender. H/O urinary retention and c/o of dysuria  Endometrial cancer  PLAN Work up per ER team Discuss endometrial cancer with the patient and her son. Discussed her appointment that has already been scheduled with GYN Oncology    An After Visit Summary was printed and given to the patient.

## 2019-02-04 NOTE — ED Notes (Signed)
Lab called, urine culture added on.

## 2019-02-04 NOTE — ED Provider Notes (Signed)
[facility] DEPT Provider Note   CSN: [id number] Arrival date & time: 02/04/19  1437     History   Chief Complaint Chief Complaint  Patient presents with  . Chills  . Post-op Problem    HPI Kathleen Mejia is a 85 y.o. female.     Patient is s/p hysterectomy on 01/28/19. She was scheduled for follow-up in the office today, but called in reporting intermittent chills, and feeling generally weak. She denies abdominal pain, vaginal bleeding. Eating well. Last BM this morning. Last void this morning. She could not find her thermometer to see if she has been running a fever. Her chills improved after taking tylenol. Patient's gynecologist has seen patient in the ED, and will discuss findings of pathology report with patient and the patient's son.  The history is provided by the patient and medical records. No language interpreter was used.  Illness Severity:  Mild Onset quality:  Gradual Duration:  1 day Timing:  Intermittent Progression:  Waxing and waning Chronicity:  New Associated symptoms: no abdominal pain, no nausea, no shortness of breath and no vomiting     Past Medical History:  Diagnosis Date  . Anxiety   . Atrial fibrillation ([place])   . Cystocele with prolapse   . Dislocated shoulder 2012   right  . Edema    left leg  . Fatty liver   . Gait disturbance   . Gallstone    1.5 cm  . Ganglion cyst   . Glaucoma   . HTN (hypertension)   . Hyperlipidemia   . LBP (low back pain)   . Obesity   . Osteoarthritis   . Rosacea   . Shortness of breath    with activities and exertion, pt denies 01/23/2019  . Venous insufficiency of leg   . Vertigo    not current 01/23/2019  . Vitamin B 12 deficiency     Patient Active Problem List   Diagnosis Date Noted  . Genital prolapse 01/28/2019  . H/O total vaginal hysterectomy 01/28/2019  . Preop exam for internal medicine 01/23/2019  . Vaginal prolapse 11/13/2018  . Stasis dermatitis of  both legs 02/18/2018  . Vitamin B12 deficiency 11/30/2017  . Glaucoma 07/31/2017  . Hearing loss 07/31/2017  . Gait disorder 07/31/2017  . Anxiety 02/01/2017  . Melena 05/05/2015  . Atrial fibrillation ([place]) 03/29/2015  . Elevated hemoglobin ([place]) 11/28/2014  . Pain in joint, ankle and foot 04/28/2014  . PVC's (premature ventricular contractions) 03/02/2014  . Abnormal echocardiogram 02/13/2014  . Chest pain, unspecified 01/30/2014  . Abdominal pain, chronic, epigastric 01/30/2014  . Tachycardia 01/30/2014  . Knee pain 12/18/2013  . Paresthesia 06/25/2013  . Obesity 12/11/2012  . Elevated glucose 12/11/2012  . URI (upper respiratory infection) 09/09/2012  . Vertigo 03/11/2012  . DOE (dyspnea on exertion) 01/26/2012  . Neoplasm of uncertain behavior of skin 04/20/2011  . Ganglion cyst 12/22/2010  . Venous insufficiency of leg 09/21/2010  . Edema 09/21/2010  . SKIN RASH 09/07/2009  . FATTY LIVER DISEASE 12/22/2008  . OSTEOARTHRITIS 12/22/2008  . Cholelithiasis with chronic cholecystitis 07/01/2008  . Cystitis 02/27/2008  . Fatigue 02/27/2008  . ABNORMAL LIVER FUNCTION TESTS 02/27/2008  . Dyslipidemia 08/29/2007  . Disturbance of skin sensation 08/22/2007  . DEFICIENCY, VITAMIN D NOS 02/27/2007  . Essential hypertension 02/27/2007  . LOW BACK PAIN 02/27/2007    Past Surgical History:  Procedure Laterality Date  . CHOLECYSTECTOMY N/A 03/27/2014   Procedure: LAPAROSCOPIC CHOLECYSTECTOMY WITH  INTRAOPERATIVE CHOLANGIOGRAM;  Surgeon: Fanny Skates, MD;  Location: [facility];  Service: General;  Laterality: N/A;  . CYSTOCELE REPAIR N/A 01/28/2019   Procedure: ANTERIOR REPAIR (CYSTOCELE);  Surgeon: [name], MD;  Location: [facility];  Service: Gynecology;  Laterality: N/A;  . CYSTOSCOPY N/A 01/28/2019   Procedure: CYSTOSCOPY;  Surgeon: [name], MD;  Location: [facility];  Service: Gynecology;  Laterality: N/A;  . TONSILLECTOMY     . VAGINAL HYSTERECTOMY Left 01/28/2019   Procedure: HYSTERECTOMY VAGINAL WITH LEFT SALPINGECTOMY;  Surgeon: [name], MD;  Location: [facility];  Service: Gynecology;  Laterality: Left;     OB History    Gravida  1   Para  1   Term  1   Preterm      AB      Living  2     SAB      TAB      Ectopic      Multiple  1   Live Births  2            Home Medications    Prior to Admission medications   Medication Sig Start Date End Date Taking? Authorizing Provider  acetaminophen (TYLENOL) 325 MG tablet Take 2 tablets (650 mg total) by mouth every 4 (four) hours as needed. 01/29/19 01/29/20  [name], MD  Ascorbic Acid (VITAMIN C) 1000 MG tablet Take 1,000 mg by mouth daily.     [provider]  aspirin 81 MG tablet Take 1 tablet (81 mg total) daily by mouth. 04/09/17   Plotnikov, Evie Lacks, MD  bumetanide (BUMEX) 0.5 MG tablet Take 1 tablet (0.5 mg total) by mouth daily with breakfast. 11/13/18   Plotnikov, Evie Lacks, MD  cephALEXin (KEFLEX) 250 MG capsule 1 po qd 01/29/19   [name], MD  [facility] Liver Oil 1000 MG CAPS Take 1,000 mg by mouth daily.     [provider]  Coenzyme Q10 (CO Q 10 PO) Take 250 mg by mouth daily.     [provider]  CRANBERRY PO Take 1 capsule by mouth daily.     [provider]  docusate sodium (COLACE) 100 MG capsule Take 1 capsule (100 mg total) by mouth 2 (two) times daily. 01/29/19   [name], MD  FLAXSEED, LINSEED, PO Take 0.5 Scoops by mouth daily. PATIENT SPRINKLE FLAX SEEDS ONTO HER CEREAL DAILY     [provider]  Garlic 973 MG CAPS Take 500 mg by mouth daily.     [provider]  Multiple Minerals (MULTI-MINERALS PO) Take 1 tablet by mouth daily.    [provider]  oxyCODONE (OXY IR/ROXICODONE) 5 MG immediate release tablet Take 1 tablet (5 mg total) by mouth every 6 (six) hours as needed for moderate pain. 01/29/19    [name], MD  polyethylene glycol (MIRALAX / GLYCOLAX) 17 g packet Take 17 g by mouth daily as needed for mild constipation. 01/29/19   [name], MD  potassium chloride (K-DUR) 10 MEQ tablet Take 1 tablet (10 mEq total) by mouth daily as needed (take with furosemide). 11/13/18   Plotnikov, Evie Lacks, MD  torsemide (DEMADEX) 20 MG tablet Take 1 tablet (20 mg total) by mouth daily. 11/13/18   Plotnikov, Evie Lacks, MD  Zinc Sulfate (ZINC 15 PO) Take 50 mg by mouth daily.     [provider]    Family History Family  History  Problem Relation Age of Onset  . Pancreatic cancer Mother   . Hypertension Mother   . Breast cancer Mother     Social History Social History   Tobacco Use  . Smoking status: Never Smoker  . Smokeless tobacco: Never Used  Substance Use Topics  . Alcohol use: No  . Drug use: Never     Allergies   Eliquis [apixaban], Losartan, Maxzide [hydrochlorothiazide w-triamterene], and Metoprolol   Review of Systems Review of Systems  Constitutional: Positive for chills.  Respiratory: Negative for shortness of breath.   Gastrointestinal: Negative for abdominal pain, nausea and vomiting.  Genitourinary: Negative for frequency.  Musculoskeletal: Positive for arthralgias.  All other systems reviewed and are negative.    Physical Exam Updated Vital Signs BP 136/68   Pulse 100   Temp 99.5 F (37.5 C) (Oral)   Resp 18   SpO2 100%   Physical Exam Vitals signs and nursing note reviewed.  Constitutional:      Appearance: Normal appearance. She is not ill-appearing.  HENT:     Head: Atraumatic.  Eyes:     Conjunctiva/sclera: Conjunctivae normal.  Cardiovascular:     Rate and Rhythm: Normal rate and regular rhythm.  Pulmonary:     Effort: Pulmonary effort is normal.     Breath sounds: Normal breath sounds.  Abdominal:     General: Bowel sounds are normal.     Palpations: Abdomen is soft.  Musculoskeletal:        General: No  swelling.  Skin:    General: Skin is warm.     Findings: No rash.  Neurological:     Mental Status: She is alert and oriented to person, place, and time.     GCS: GCS eye subscore is 4. GCS verbal subscore is 5. GCS motor subscore is 6.     Sensory: Sensation is intact.     Motor: Motor function is intact.      ED Treatments / Results  Labs (all labs ordered are listed, but only abnormal results are displayed) Labs Reviewed  CBC WITH DIFFERENTIAL/PLATELET - Abnormal; Notable for the following components:      Result Value   WBC 12.7 (*)    RBC 3.22 (*)    Hemoglobin 10.2 (*)    HCT 30.9 (*)    Neutro Abs 10.5 (*)    Monocytes Absolute 1.1 (*)    All other components within normal limits  BASIC METABOLIC PANEL - Abnormal; Notable for the following components:   Potassium 2.7 (*)    Chloride 92 (*)    Glucose, Bld 128 (*)    Calcium 8.8 (*)    All other components within normal limits  URINALYSIS, ROUTINE W REFLEX MICROSCOPIC - Abnormal; Notable for the following components:   Color, Urine AMBER (*)    APPearance CLOUDY (*)    Hgb urine dipstick LARGE (*)    Ketones, ur 20 (*)    Protein, ur 30 (*)    Leukocytes,Ua LARGE (*)    WBC, UA >50 (*)    Bacteria, UA MANY (*)    All other components within normal limits  URINE CULTURE    EKG None  Radiology No results found.  Procedures Procedures (including critical care time)  Medications Ordered in ED Medications - No data to display   Initial Impression / Assessment and Plan / ED Course  I have reviewed the triage vital signs and the nursing notes.  Pertinent labs & imaging results  that were available during my care of the patient were reviewed by me and considered in my medical decision making (see chart for details).        Patient discussed with Dr. Tomi Bamberger.  Mild leukocytosis, likely due to recent surgical procedure. Patient afebrile.  Patient noted to be hypokalemic for today's visit. Patient with  history of hypokalemia. Supplementation in ED with increase in dosing at home for 3 days. Follow-up with PCP.  Pt diagnosed with a UTI. Pt is afebrile, without tachycardia, hypotension, or other signs of serious infection.  Patient given dose of rocephin in ED, will be dc home with oral antibiotics and instructions to follow up with PCP if symptoms persist. Discussed return precautions. Pt appears safe for discharge.  Final Clinical Impressions(s) / ED Diagnoses   Final diagnoses:  Hypokalemia  Acute cystitis with hematuria    ED Discharge Orders         Ordered    cephALEXin (KEFLEX) 500 MG capsule  4 times daily     02/04/19 2140           Etta Quill, NP 02/05/19 9355    Dorie Rank, MD 02/06/19 1236

## 2019-02-04 NOTE — Discharge Instructions (Signed)
Please take 20 mg of potassium once per day for the next three days, then return to your normal dosing. Please follow-up with your primary care provider to monitor your potassium level. Take antibiotic as directed for the urinary tract infection. You may follow up with your gynecologist or your primary care provider to make sure the infection has cleared after completing your antibiotic.

## 2019-02-04 NOTE — Telephone Encounter (Signed)
I also spoke with the patient. She is going to the ER for evaluation.

## 2019-02-04 NOTE — ED Triage Notes (Signed)
Pt reports had hysterectomy last Tuesday. Intermittently during the week had chills.

## 2019-02-06 ENCOUNTER — Other Ambulatory Visit: Payer: Medicare Other | Admitting: Obstetrics and Gynecology

## 2019-02-06 ENCOUNTER — Other Ambulatory Visit: Payer: Self-pay | Admitting: *Deleted

## 2019-02-06 ENCOUNTER — Other Ambulatory Visit: Payer: Medicare Other

## 2019-02-06 DIAGNOSIS — R339 Retention of urine, unspecified: Secondary | ICD-10-CM

## 2019-02-06 LAB — URINE CULTURE: Culture: >=100000 — AB

## 2019-02-07 ENCOUNTER — Ambulatory Visit (INDEPENDENT_AMBULATORY_CARE_PROVIDER_SITE_OTHER): Payer: Medicare Other | Admitting: Obstetrics and Gynecology

## 2019-02-07 ENCOUNTER — Telehealth: Payer: Self-pay

## 2019-02-07 ENCOUNTER — Other Ambulatory Visit: Payer: Self-pay

## 2019-02-07 ENCOUNTER — Encounter: Payer: Self-pay | Admitting: Obstetrics and Gynecology

## 2019-02-07 ENCOUNTER — Telehealth: Payer: Self-pay | Admitting: Obstetrics and Gynecology

## 2019-02-07 VITALS — BP 108/64 | HR 84 | Temp 98.3°F | Ht 61.0 in | Wt 158.8 lb

## 2019-02-07 DIAGNOSIS — N939 Abnormal uterine and vaginal bleeding, unspecified: Secondary | ICD-10-CM

## 2019-02-07 DIAGNOSIS — N3001 Acute cystitis with hematuria: Secondary | ICD-10-CM

## 2019-02-07 MED ORDER — SULFAMETHOXAZOLE-TRIMETHOPRIM 800-160 MG PO TABS: 1.0000 | ORAL_TABLET | Freq: Two times a day (BID) | ORAL | 0 refills | Status: DC

## 2019-02-07 NOTE — Telephone Encounter (Signed)
Call reviewed with Dr. Quincy Simmonds, call returned to patient. Advised patient to come directly to office, will be worked into schedule. Patient verbalizes understanding and is agreeable.   Routing to provider for final review. Patient is agreeable to disposition. Will close encounter.  Cc: Dr. Talbert Nan

## 2019-02-07 NOTE — Progress Notes (Signed)
ED Antimicrobial Stewardship Positive Culture Follow Up   Kathleen Mejia is an 85 y.o. female who presented to [facility] on 02/04/2019 with a chief complaint of fever, chills and generalized weakness.  Chief Complaint  Patient presents with  . Chills  . Post-op Problem    Recent Results (from the past 720 hour(s))  SARS CORONAVIRUS 2 Nasal Swab Aptima Multi Swab     Status: None   Collection Time: 01/24/19  3:34 PM   Specimen: Aptima Multi Swab; Nasal Swab  Result Value Ref Range Status   SARS Coronavirus 2 NEGATIVE NEGATIVE Final    Comment: (NOTE) SARS-CoV-2 target nucleic acids are NOT DETECTED. The SARS-CoV-2 RNA is generally detectable in upper and lower respiratory specimens during the acute phase of infection. Negative results do not preclude SARS-CoV-2 infection, do not rule out co-infections with other pathogens, and should not be used as the sole basis for treatment or other patient management decisions. Negative results must be combined with clinical observations, patient history, and epidemiological information. The expected result is Negative. Fact Sheet for Patients: SugarRoll.be Fact Sheet for Healthcare Providers: https://www.woods-mathews.com/ This test is not yet approved or cleared by the [place] FDA and  has been authorized for detection and/or diagnosis of SARS-CoV-2 by FDA under an Emergency Use Authorization (EUA). This EUA will remain  in effect (meaning this test can be used) for the duration of the COVID-19 declaration under Section 56 4(b)(1) of the Act, 21 U.S.C. section 360bbb-3(b)(1), unless the authorization is terminated or revoked sooner. Performed at [facility], [place] [address], [place], [place] [address]   Urine Culture     Status: None   Collection Time: 01/28/19 11:18 PM   Specimen: Urine, Random  Result Value Ref Range Status   Specimen Description   Final    URINE,  RANDOM Performed at [facility] [address] [place], [place] [address]    Special Requests   Final    NONE Performed at [facility], [place] [address], [place], [place] [address]    Culture   Final    NO GROWTH Performed at [facility], [place] [address], [place], [place] [address]    Report Status 01/30/2019 FINAL  Final  Urine culture     Status: Abnormal   Collection Time: 02/04/19  8:20 PM   Specimen: Urine, Random  Result Value Ref Range Status   Specimen Description   Final    URINE, RANDOM Performed at [facility] [address] [place], [place] [address]    Special Requests   Final    NONE Performed at [facility], [place] [address], [place], [place] [address]    Culture >=100,000 COLONIES/mL ENTEROBACTER AEROGENES (A)  Final   Report Status 02/06/2019 FINAL  Final   Organism ID, Bacteria ENTEROBACTER AEROGENES (A)  Final      Susceptibility   Enterobacter aerogenes - MIC*    CEFAZOLIN >=64 RESISTANT Resistant     CEFTRIAXONE <=1 SENSITIVE Sensitive     CIPROFLOXACIN <=0.25 SENSITIVE Sensitive     GENTAMICIN <=1 SENSITIVE Sensitive     IMIPENEM 2 SENSITIVE Sensitive     NITROFURANTOIN 128 RESISTANT Resistant     TRIMETH/SULFA <=20 SENSITIVE Sensitive     PIP/TAZO <=4 SENSITIVE Sensitive     * >=100,000 COLONIES/mL ENTEROBACTER AEROGENES    [x]  Treated with cephalexin, organism resistant to prescribed antimicrobial []  Patient discharged originally without antimicrobial agent  and treatment is now indicated  Plan: 1) d/c cephalexin 2)  Start New antibiotic prescription: bactrim DS 1 tablet twice daily x10 days  ED Provider: Felipa Furnace, PA-C   [employer], Nathanyel Defenbaugh P 02/07/2019, 11:02 AM Clinical Pharmacist [phone]

## 2019-02-07 NOTE — Telephone Encounter (Signed)
Patient noticed blood in vaginal area.

## 2019-02-07 NOTE — Telephone Encounter (Signed)
Spoke with patient. Reports spotting on 02/06/19, has increased more today. States bleeding was "more than a normal period, now dark red". Has been straining with BMs, last BM 9/3, describes as "little balls", taking Mirlax and stool softners daily. States she is nervous and upset, unsure what to do. Was seen in ER on 9/1, tx for UTI. Denies urinary symptoms, fever/chills, N/V, pelvic pain or vaginal odor. Advised patient OV needed for further evaluation, will need to review schedule with Dr. Quincy Simmonds and return call. Patient agreeable.

## 2019-02-07 NOTE — Progress Notes (Addendum)
GYNECOLOGY  VISIT   HPI: 85 y.o.   Widowed  Caucasian  female   G1P1002 with No LMP recorded. Patient is postmenopausal.   Patient is here for post op bleeding.    She had very little bleeding last am and last hs.  This am had increased bleeding.  Her son states she is going chores at home.   Voiding well.   She went to the hospital for UTI and low K.  She is now on Keflex and taking potassium replacement.  Her UC results in Epic show > 100,00 Enterobacer resistant to Cefazolin and Nitrofurantoin and sensitive to Bactrim.   She stopped the Colace and she is still taking Miralax.   GYNECOLOGIC HISTORY: No LMP recorded. Patient is postmenopausal. Contraception:  Hysterectomy Menopausal hormone therapy:  none Last mammogram:  10/29/2018 BIRADS 1 Negative Density B Last pap smear:   unsure        OB History    Gravida  1   Para  1   Term  1   Preterm      AB      Living  2     SAB      TAB      Ectopic      Multiple  1   Live Births  2              Patient Active Problem List   Diagnosis Date Noted  . Genital prolapse 01/28/2019  . H/O total vaginal hysterectomy 01/28/2019  . Preop exam for internal medicine 01/23/2019  . Vaginal prolapse 11/13/2018  . Stasis dermatitis of both legs 02/18/2018  . Vitamin B12 deficiency 11/30/2017  . Glaucoma 07/31/2017  . Hearing loss 07/31/2017  . Gait disorder 07/31/2017  . Anxiety 02/01/2017  . Melena 05/05/2015  . Atrial fibrillation ([place]) 03/29/2015  . Elevated hemoglobin ([place]) 11/28/2014  . Pain in joint, ankle and foot 04/28/2014  . PVC's (premature ventricular contractions) 03/02/2014  . Abnormal echocardiogram 02/13/2014  . Chest pain, unspecified 01/30/2014  . Abdominal pain, chronic, epigastric 01/30/2014  . Tachycardia 01/30/2014  . Knee pain 12/18/2013  . Paresthesia 06/25/2013  . Obesity 12/11/2012  . Elevated glucose 12/11/2012  . URI (upper respiratory infection) 09/09/2012  . Vertigo  03/11/2012  . DOE (dyspnea on exertion) 01/26/2012  . Neoplasm of uncertain behavior of skin 04/20/2011  . Ganglion cyst 12/22/2010  . Venous insufficiency of leg 09/21/2010  . Edema 09/21/2010  . SKIN RASH 09/07/2009  . FATTY LIVER DISEASE 12/22/2008  . OSTEOARTHRITIS 12/22/2008  . Cholelithiasis with chronic cholecystitis 07/01/2008  . Cystitis 02/27/2008  . Fatigue 02/27/2008  . ABNORMAL LIVER FUNCTION TESTS 02/27/2008  . Dyslipidemia 08/29/2007  . Disturbance of skin sensation 08/22/2007  . DEFICIENCY, VITAMIN D NOS 02/27/2007  . Essential hypertension 02/27/2007  . LOW BACK PAIN 02/27/2007    Past Medical History:  Diagnosis Date  . Anxiety   . Atrial fibrillation ([place])   . Cystocele with prolapse   . Dislocated shoulder 2012   right  . Edema    left leg  . Fatty liver   . Gait disturbance   . Gallstone    1.5 cm  . Ganglion cyst   . Glaucoma   . HTN (hypertension)   . Hyperlipidemia   . LBP (low back pain)   . Obesity   . Osteoarthritis   . Rosacea   . Shortness of breath    with activities and exertion, pt denies 01/23/2019  .  Venous insufficiency of leg   . Vertigo    not current 01/23/2019  . Vitamin B 12 deficiency     Past Surgical History:  Procedure Laterality Date  . CHOLECYSTECTOMY N/A 03/27/2014   Procedure: LAPAROSCOPIC CHOLECYSTECTOMY WITH INTRAOPERATIVE CHOLANGIOGRAM;  Surgeon: Fanny Skates, MD;  Location: [facility];  Service: General;  Laterality: N/A;  . CYSTOCELE REPAIR N/A 01/28/2019   Procedure: ANTERIOR REPAIR (CYSTOCELE);  Surgeon: [name], MD;  Location: [facility];  Service: Gynecology;  Laterality: N/A;  . CYSTOSCOPY N/A 01/28/2019   Procedure: CYSTOSCOPY;  Surgeon: [name], MD;  Location: [facility];  Service: Gynecology;  Laterality: N/A;  . TONSILLECTOMY    . VAGINAL HYSTERECTOMY Left 01/28/2019   Procedure: HYSTERECTOMY VAGINAL WITH LEFT SALPINGECTOMY;  Surgeon: [name], MD;  Location: [facility];  Service: Gynecology;  Laterality: Left;    Current Outpatient Medications  Medication Sig Dispense Refill  . acetaminophen (TYLENOL) 325 MG tablet Take 2 tablets (650 mg total) by mouth every 4 (four) hours as needed. (Patient taking differently: Take 650 mg by mouth every 4 (four) hours as needed for moderate pain or headache. ) 100 tablet 2  . Ascorbic Acid (VITAMIN C) 1000 MG tablet Take 1,000 mg by mouth daily.     [name] aspirin 81 MG tablet Take 1 tablet (81 mg total) daily by mouth. 30 tablet 11  . bumetanide (BUMEX) 0.5 MG tablet Take 1 tablet (0.5 mg total) by mouth daily with breakfast. 90 tablet 3  . cephALEXin (KEFLEX) 500 MG capsule Take 1 capsule (500 mg total) by mouth 4 (four) times daily. 20 capsule 0  . Cod Liver Oil 1000 MG CAPS Take 1,000 mg by mouth daily.     . Coenzyme Q10 (CO Q 10 PO) Take 250 mg by mouth daily.     [name] CRANBERRY PO Take 1 capsule by mouth daily.     [name] docusate sodium (COLACE) 100 MG capsule Take 1 capsule (100 mg total) by mouth 2 (two) times daily. 60 capsule 0  . FLAXSEED, LINSEED, PO Take 0.5 Scoops by mouth daily. PATIENT SPRINKLE FLAX SEEDS ONTO HER CEREAL DAILY     . Garlic 916 MG CAPS Take 500 mg by mouth daily.     . Multiple Minerals (MULTI-MINERALS PO) Take 1 tablet by mouth daily.    . polyethylene glycol (MIRALAX / GLYCOLAX) 17 g packet Take 17 g by mouth daily as needed for mild constipation. 14 each 0  . potassium chloride (K-DUR) 10 MEQ tablet Take 1 tablet (10 mEq total) by mouth daily as needed (take with furosemide). 90 tablet 3  . Zinc Sulfate (ZINC 15 PO) Take 50 mg by mouth daily.      No current facility-administered medications for this visit.      ALLERGIES: Eliquis [apixaban], Losartan, Maxzide [hydrochlorothiazide w-triamterene], and Metoprolol  Family History  Problem Relation Age of Onset  . Pancreatic cancer Mother   . Hypertension Mother   . Breast cancer Mother      Social History   Socioeconomic History  . Marital status: Widowed    Spouse name: Not on file  . Number of children: Not on file  . Years of education: Not on file  . Highest education level: Not on file  Occupational History  . Occupation: Retired  Scientific laboratory technician  . Financial resource strain: Not on file  . Food insecurity    Worry: Not on file  Inability: Not on file  . Transportation needs    Medical: Not on file    Non-medical: Not on file  Tobacco Use  . Smoking status: Never Smoker  . Smokeless tobacco: Never Used  Substance and Sexual Activity  . Alcohol use: No  . Drug use: Never  . Sexual activity: Not Currently    Birth control/protection: Post-menopausal  Lifestyle  . Physical activity    Days per week: Not on file    Minutes per session: Not on file  . Stress: Not on file  Relationships  . Social Herbalist on phone: Not on file    Gets together: Not on file    Attends religious service: Not on file    Active member of club or organization: Not on file    Attends meetings of clubs or organizations: Not on file    Relationship status: Not on file  . Intimate partner violence    Fear of current or ex partner: Not on file    Emotionally abused: Not on file    Physically abused: Not on file    Forced sexual activity: Not on file  Other Topics Concern  . Not on file  Social History Narrative   Not taking vaccines          Review of Systems  Constitutional: Negative.   HENT: Negative.   Eyes: Negative.   Respiratory: Negative.   Cardiovascular: Negative.   Gastrointestinal: Negative.   Endocrine: Negative.   Genitourinary: Positive for vaginal bleeding.  Musculoskeletal: Negative.   Skin: Negative.   Allergic/Immunologic: Negative.   Neurological: Negative.   Hematological: Negative.   Psychiatric/Behavioral: Negative.     PHYSICAL EXAMINATION:    BP 108/64 (BP Location: Left Arm, Patient Position: Sitting, Cuff Size:  Normal)   Pulse 84   Temp 98.3 F (36.8 C) (Temporal)   Ht 5\' 1"  (1.549 m)   Wt 158 lb 12.8 oz (72 kg)   BMI 30.00 kg/m     General appearance: alert, cooperative and appears stated age   Pelvic: External genitalia:  no lesions              Urethra:  normal appearing urethra with no masses, tenderness or lesions              Bartholins and Skenes: normal                 Vagina: normal appearing vagina with normal color and discharge, no lesions.  Small amount of blood in the vagina.  Sutures intact by visualization and palpation.               Cervix: absent                Bimanual Exam:  Uterus:   absent              Adnexa: no mass, fullness, tenderness          Chaperone was present for exam.  ASSESSMENT  Vaginal bleeding post op.  Suture lines intact. UTI, resistant to Keflex.   PLAN  I had her son participate in our discussion and I assured them her surgery is intact, but that she needs to stop doing chores at home.  I emphasized no lifting over 10 pounds.  I reviewed her UC results and recommended she stop Keflex and start Bactrim DS po bid x 1 week.  She is going to follow up with Dr. Talbert Nan in 4  days.  She will call during the holiday weekend if she feeling any worse.    An After Visit Summary was printed and given to the patient.

## 2019-02-07 NOTE — Telephone Encounter (Signed)
Post ED Visit - Positive Culture Follow-up: Unsuccessful Patient Follow-up  Culture assessed and recommendations reviewed by:  []  Elenor Quinones, Pharm.D. []  Heide Guile, Pharm.D., BCPS AQ-ID []  Parks Neptune, Pharm.D., BCPS []  Alycia Rossetti, Pharm.D., BCPS []  [place], Pharm.D., BCPS, AAHIVP []  Legrand Como, Pharm.D., BCPS, AAHIVP []  Wynell Balloon, PharmD []  Vincenza Hews, PharmD, BCPS Pharm D Positive urine culture  []  Patient discharged without antimicrobial prescription and treatment is now indicated [x]  Organism is resistant to prescribed ED discharge antimicrobial []  Patient with positive blood cultures   Unable to contact patient after 3 attempts, letter will be sent to address on file  Genia Del 02/07/2019, 12:08 PM

## 2019-02-09 NOTE — Progress Notes (Signed)
Consult Note: Gyn-Onc  Kathleen Mejia 85 y.o. female  CC:  Chief Complaint  Patient presents with  . New Patient (Initial Visit)    HPI: Patient is seen today in consultation at the request of Dr. Talbert Nan  Patient is an 85 year old G1P1002 who is postmenopausal.She states that she went through menopause in her 66s and never took any hormone replacement therapy.  She had no episodes of any postmenopausal bleeding.  She presented with a very large grade 3 cystocele and grade 2 uterine prolapse with urinary retention and overflow and urge incontinence. At her initial visit in 7/20 she had a PVR of 280 cc. They had tried multiple pessaries (ring, gelhorn and cube pessaries) in the 6 weeks prior to surgery without continued success. Several of the pessaries worked for a few days, then she would start prolapsing in front of them. The largest of the pessaries are too uncomfortable. With the pessary in her urinary flow is much better and her incontinence is much improved. She denies GSI with the pessary in. She had normal renal function in 6/20.  01/28/19. Preoperative Diagnosis: grade 3 cystocele, grade 2 uterine prolapse Procedure:  Total Vaginal Hysterectomy with left salpingectomy,  Cystocele repair and cystoscopy Indications for surgery: The patient is a 85 year old female, who presented with severe genital prolapse and urinary retention. Many types of pessary were tried without success. Preoperative evaluation included a normal CBC and CMP. She had an ultrasound that showed a slightly thickened endometrial stripe (6 mm).   Diagnosis Uterus and cervix, left fallopian tube - UTERUS: -ENDO/MYOMETRIUM: INVASIVE ENDOMETRIOID ADENOCARCINOMA (FIGO GRADE I). TUMOR INVADES LESS THAN ONE HALF OF THE MYOMETRIUM (12m/1mm) SEE ONCOLOGY TABLE. LEIOMYOMATA. -SEROSA: UNREMARKABLE. NO MALIGNANCY. - CERVIX: BENIGN SQUAMOUS AND ENDOCERVICAL MUCOSA. NO DYSPLASIA OR MALIGNANCY. - LEFT FALLOPIAN TUBE:  UNREMARKABLE. NO MALIGNANCY.  MMR intact and MSI-Stable.  She comes in today for discussion based on pathology report.  She has been doing fairly well since her surgery.  She has a little bit of bleeding but she was seen by Dr. JTalbert Nanyesterday and was told that it was to be expected.  She did not want an exam today as she had had one yesterday.  She states that she already feels like her bladder is better.  She is using stool softener for her bowels and they are regular.  She is up-to-date on her mammograms that she states she has 1 every year.  She states that she is never had a colonoscopy.  We reviewed her pathology report in detail.  I discussed with her that as she has an early endometrial cancer there is a very low rate of nodal metastasis and based on literature review she will have a favorable outcome whether or not lymphadenectomy is performed or not.  I do not believe that she would benefit from any additional surgery nor do I think there is an indication for any additional imaging.  Review of Systems: Constitutional: Recovering from surgery.  Denies fever. Needs help getting dressed. Skin: No rash Cardiovascular: No chest pain, shortness of breath, or edema  Pulmonary: No cough  Gastro Intestinal:  No nausea, vomiting, constipation, or diarrhea reported.  Genitourinary: +vaginal bleeding Psychology: Feeling very relieved after our discussion regarding her pathology.  Current Meds:  Outpatient Encounter Medications as of 02/12/2019  Medication Sig  . acetaminophen (TYLENOL) 325 MG tablet Take 2 tablets (650 mg total) by mouth every 4 (four) hours as needed. (Patient taking differently: Take  650 mg by mouth every 4 (four) hours as needed for moderate pain or headache. )  . Ascorbic Acid (VITAMIN C) 1000 MG tablet Take 1,000 mg by mouth daily.   . bumetanide (BUMEX) 0.5 MG tablet Take 1 tablet (0.5 mg total) by mouth daily with breakfast.  . Cod Liver Oil 1000 MG CAPS Take 1,000 mg by  mouth daily.   . Coenzyme Q10 (CO Q 10 PO) Take 250 mg by mouth daily.   [name] CRANBERRY PO Take 1 capsule by mouth daily.   [name] docusate sodium (COLACE) 100 MG capsule Take 1 capsule (100 mg total) by mouth 2 (two) times daily.  [name] FLAXSEED, LINSEED, PO Take 0.5 Scoops by mouth daily. PATIENT SPRINKLE FLAX SEEDS ONTO HER CEREAL DAILY   . Garlic 947 MG CAPS Take 500 mg by mouth daily.   . Multiple Minerals (MULTI-MINERALS PO) Take 1 tablet by mouth daily.  . polyethylene glycol (MIRALAX / GLYCOLAX) 17 g packet Take 17 g by mouth daily as needed for mild constipation.  . potassium chloride (K-DUR) 10 MEQ tablet Take 1 tablet (10 mEq total) by mouth daily as needed (take with furosemide).  [name] sulfamethoxazole-trimethoprim (BACTRIM DS) 800-160 MG tablet Take 1 tablet by mouth 2 (two) times daily. Take for 7 days.  . Zinc Sulfate (ZINC 15 PO) Take 50 mg by mouth daily.   . [DISCONTINUED] aspirin 81 MG tablet Take 1 tablet (81 mg total) daily by mouth.  . [DISCONTINUED] cephALEXin (KEFLEX) 500 MG capsule Take 1 capsule (500 mg total) by mouth 4 (four) times daily.  . [DISCONTINUED] oxyCODONE (OXY IR/ROXICODONE) 5 MG immediate release tablet Take 1 tablet (5 mg total) by mouth every 6 (six) hours as needed for moderate pain. (Patient not taking: Reported on 02/04/2019)  . [DISCONTINUED] torsemide (DEMADEX) 20 MG tablet Take 1 tablet (20 mg total) by mouth daily. (Patient not taking: Reported on 02/04/2019)   No facility-administered encounter medications on file as of 02/12/2019.     Allergy:  Allergies  Allergen Reactions  . Eliquis [Apixaban]     HAs, achy  . Losartan     "all side effects and an article from [facility] re Valsartan recall"  . Maxzide [Hydrochlorothiazide W-Triamterene]     Cramps w/a high dose  . Metoprolol     achy    Social Hx:   Social History   Socioeconomic History  . Marital status: Widowed    Spouse name: Not on file  . Number of children: Not on file  . Years of education: Not  on file  . Highest education level: Not on file  Occupational History  . Occupation: Retired  Scientific laboratory technician  . Financial resource strain: Not on file  . Food insecurity    Worry: Not on file    Inability: Not on file  . Transportation needs    Medical: Not on file    Non-medical: Not on file  Tobacco Use  . Smoking status: Never Smoker  . Smokeless tobacco: Never Used  Substance and Sexual Activity  . Alcohol use: No  . Drug use: Never  . Sexual activity: Not Currently    Birth control/protection: Post-menopausal  Lifestyle  . Physical activity    Days per week: Not on file    Minutes per session: Not on file  . Stress: Not on file  Relationships  . Social Herbalist on phone: Not on file    Gets together: Not on file  Attends religious service: Not on file    Active member of club or organization: Not on file    Attends meetings of clubs or organizations: Not on file    Relationship status: Not on file  . Intimate partner violence    Fear of current or ex partner: Not on file    Emotionally abused: Not on file    Physically abused: Not on file    Forced sexual activity: Not on file  Other Topics Concern  . Not on file  Social History Narrative   Not taking vaccines          Past Surgical Hx:  Past Surgical History:  Procedure Laterality Date  . CHOLECYSTECTOMY N/A 03/27/2014   Procedure: LAPAROSCOPIC CHOLECYSTECTOMY WITH INTRAOPERATIVE CHOLANGIOGRAM;  Surgeon: Fanny Skates, MD;  Location: [place];  Service: General;  Laterality: N/A;  . CYSTOCELE REPAIR N/A 01/28/2019   Procedure: ANTERIOR REPAIR (CYSTOCELE);  Surgeon: [name], MD;  Location: [facility];  Service: Gynecology;  Laterality: N/A;  . CYSTOSCOPY N/A 01/28/2019   Procedure: CYSTOSCOPY;  Surgeon: [name], MD;  Location: [facility];  Service: Gynecology;  Laterality: N/A;  . TONSILLECTOMY    . VAGINAL HYSTERECTOMY Left 01/28/2019    Procedure: HYSTERECTOMY VAGINAL WITH LEFT SALPINGECTOMY;  Surgeon: [name], MD;  Location: [facility];  Service: Gynecology;  Laterality: Left;    Past Medical Hx:  Past Medical History:  Diagnosis Date  . Anxiety   . Atrial fibrillation ([place])   . Cystocele with prolapse   . Dislocated shoulder 2012   right  . Edema    left leg  . Fatty liver   . Gait disturbance   . Gallstone    1.5 cm  . Ganglion cyst   . Glaucoma   . HTN (hypertension)   . Hyperlipidemia   . LBP (low back pain)   . Obesity   . Osteoarthritis   . Rosacea   . Shortness of breath    with activities and exertion, pt denies 01/23/2019  . Venous insufficiency of leg   . Vertigo    not current 01/23/2019  . Vitamin B 12 deficiency     Oncology Hx:  Oncology History  Endometrial ca [facility])  01/28/2019 Initial Diagnosis   Endometrial ca [facility])    Genetic Testing   Patient has genetic testing done for MMR Results revealed patient has the following mutation(s): MMR intact, MSI-Stable. Normal phenotype     Family Hx:  Family History  Problem Relation Age of Onset  . Pancreatic cancer Mother   . Hypertension Mother   . Breast cancer Mother   . Kidney failure Sister     Vitals:  Blood pressure 112/86, pulse 99, temperature 98.3 F (36.8 C), temperature source Temporal, resp. rate 18, height 5' (1.524 m), weight 160 lb (72.6 kg), SpO2 100 %.  Physical Exam: Well-nourished well-developed female who appears stated age in no acute distress.  Physical examination was declined at the patient's request.  Assessment/Plan:  85 year old with a stage Ia grade 1 endometrioid adenocarcinoma.  Although she has not been comprehensively staged, she had 30% myometrial invasion, no lymphovascular space involvement and a grade 1 lesion.  I do not think that she would benefit significantly from a staging lymphadenectomy nor do I believe she needs to have repeat surgery to have her adnexa  removed.  While I cannot see the reports of the ultrasound results, there is no mention of  any abnormality on any of her clinic notes.  I also discussed the MMR testing as well as the MSI testing.  She seemed a bit overwhelmed with the information.  She was offered the opportunity to have her son on the phone but she did not want to do that today.  She knows that we are happy to speak with her family members if they have any questions.  At this point I would recommend surveillance.  She will see Dr. Talbert Nan in 6 months and will return to see [place] in 1 year.  She was told with the most common signs and symptoms of recurrent endometrial cancer is and that is vaginal bleeding.  Her questions were elicited and answered to her satisfaction. Kathleen Mejia A., MD 02/12/2019, 12:48 PM

## 2019-02-11 ENCOUNTER — Encounter: Payer: Self-pay | Admitting: Obstetrics and Gynecology

## 2019-02-11 ENCOUNTER — Ambulatory Visit (INDEPENDENT_AMBULATORY_CARE_PROVIDER_SITE_OTHER): Payer: Medicare Other | Admitting: Obstetrics and Gynecology

## 2019-02-11 ENCOUNTER — Other Ambulatory Visit: Payer: Self-pay | Admitting: *Deleted

## 2019-02-11 ENCOUNTER — Ambulatory Visit (INDEPENDENT_AMBULATORY_CARE_PROVIDER_SITE_OTHER): Payer: Medicare Other

## 2019-02-11 ENCOUNTER — Other Ambulatory Visit: Payer: Self-pay

## 2019-02-11 VITALS — BP 120/78 | HR 82 | Temp 97.2°F | Wt 158.4 lb

## 2019-02-11 DIAGNOSIS — Z87448 Personal history of other diseases of urinary system: Secondary | ICD-10-CM

## 2019-02-11 DIAGNOSIS — Z87898 Personal history of other specified conditions: Secondary | ICD-10-CM

## 2019-02-11 DIAGNOSIS — R339 Retention of urine, unspecified: Secondary | ICD-10-CM

## 2019-02-11 DIAGNOSIS — C541 Malignant neoplasm of endometrium: Secondary | ICD-10-CM

## 2019-02-11 DIAGNOSIS — Z90710 Acquired absence of both cervix and uterus: Secondary | ICD-10-CM

## 2019-02-11 NOTE — Progress Notes (Signed)
GYNECOLOGY  VISIT   HPI: 85 y.o.   Widowed White or Caucasian Not Hispanic or Latino  female   [phone] with No LMP recorded. Patient is postmenopausal.   here for  Post op follow up. She had pre-operative urinary retention, had a catheter in for 3 days post op, had a normal voiding trial, then felt unsure that she was emptying her bladder. She was seen on Friday with some vaginal bleeding, normal exam without focal source of bleeding. She continues to have light vaginal bleeding off and on. She thinks she is voiding better, leaking less. She was treated last week with bactrim for a UTI and is feeling better.    GYNECOLOGIC HISTORY: No LMP recorded. Patient is postmenopausal. Contraception: NA Menopausal hormone therapy: none        OB History    Gravida  1   Para  1   Term  1   Preterm      AB      Living  2     SAB      TAB      Ectopic      Multiple  1   Live Births  2              Patient Active Problem List   Diagnosis Date Noted  . Genital prolapse 01/28/2019  . H/O total vaginal hysterectomy 01/28/2019  . Preop exam for internal medicine 01/23/2019  . Vaginal prolapse 11/13/2018  . Stasis dermatitis of both legs 02/18/2018  . Vitamin B12 deficiency 11/30/2017  . Glaucoma 07/31/2017  . Hearing loss 07/31/2017  . Gait disorder 07/31/2017  . Anxiety 02/01/2017  . Melena 05/05/2015  . Atrial fibrillation ([place]) 03/29/2015  . Elevated hemoglobin ([place]) 11/28/2014  . Pain in joint, ankle and foot 04/28/2014  . PVC's (premature ventricular contractions) 03/02/2014  . Abnormal echocardiogram 02/13/2014  . Chest pain, unspecified 01/30/2014  . Abdominal pain, chronic, epigastric 01/30/2014  . Tachycardia 01/30/2014  . Knee pain 12/18/2013  . Paresthesia 06/25/2013  . Obesity 12/11/2012  . Elevated glucose 12/11/2012  . URI (upper respiratory infection) 09/09/2012  . Vertigo 03/11/2012  . DOE (dyspnea on exertion) 01/26/2012  . Neoplasm of uncertain  behavior of skin 04/20/2011  . Ganglion cyst 12/22/2010  . Venous insufficiency of leg 09/21/2010  . Edema 09/21/2010  . SKIN RASH 09/07/2009  . FATTY LIVER DISEASE 12/22/2008  . OSTEOARTHRITIS 12/22/2008  . Cholelithiasis with chronic cholecystitis 07/01/2008  . Cystitis 02/27/2008  . Fatigue 02/27/2008  . ABNORMAL LIVER FUNCTION TESTS 02/27/2008  . Dyslipidemia 08/29/2007  . Disturbance of skin sensation 08/22/2007  . DEFICIENCY, VITAMIN D NOS 02/27/2007  . Essential hypertension 02/27/2007  . LOW BACK PAIN 02/27/2007    Past Medical History:  Diagnosis Date  . Anxiety   . Atrial fibrillation ([place])   . Cystocele with prolapse   . Dislocated shoulder 2012   right  . Edema    left leg  . Fatty liver   . Gait disturbance   . Gallstone    1.5 cm  . Ganglion cyst   . Glaucoma   . HTN (hypertension)   . Hyperlipidemia   . LBP (low back pain)   . Obesity   . Osteoarthritis   . Rosacea   . Shortness of breath    with activities and exertion, pt denies 01/23/2019  . Venous insufficiency of leg   . Vertigo    not current 01/23/2019  . Vitamin B 12 deficiency  Past Surgical History:  Procedure Laterality Date  . CHOLECYSTECTOMY N/A 03/27/2014   Procedure: LAPAROSCOPIC CHOLECYSTECTOMY WITH INTRAOPERATIVE CHOLANGIOGRAM;  Surgeon: Fanny Skates, MD;  Location: [facility];  Service: General;  Laterality: N/A;  . CYSTOCELE REPAIR N/A 01/28/2019   Procedure: ANTERIOR REPAIR (CYSTOCELE);  Surgeon: [name], MD;  Location: [facility];  Service: Gynecology;  Laterality: N/A;  . CYSTOSCOPY N/A 01/28/2019   Procedure: CYSTOSCOPY;  Surgeon: [name], MD;  Location: [facility];  Service: Gynecology;  Laterality: N/A;  . TONSILLECTOMY    . VAGINAL HYSTERECTOMY Left 01/28/2019   Procedure: HYSTERECTOMY VAGINAL WITH LEFT SALPINGECTOMY;  Surgeon: [name], MD;  Location: [facility];  Service: Gynecology;   Laterality: Left;    Current Outpatient Medications  Medication Sig Dispense Refill  . acetaminophen (TYLENOL) 325 MG tablet Take 2 tablets (650 mg total) by mouth every 4 (four) hours as needed. (Patient taking differently: Take 650 mg by mouth every 4 (four) hours as needed for moderate pain or headache. ) 100 tablet 2  . Ascorbic Acid (VITAMIN C) 1000 MG tablet Take 1,000 mg by mouth daily.     [name] aspirin 81 MG tablet Take 1 tablet (81 mg total) daily by mouth. 30 tablet 11  . bumetanide (BUMEX) 0.5 MG tablet Take 1 tablet (0.5 mg total) by mouth daily with breakfast. 90 tablet 3  . Cod Liver Oil 1000 MG CAPS Take 1,000 mg by mouth daily.     . Coenzyme Q10 (CO Q 10 PO) Take 250 mg by mouth daily.     [name] CRANBERRY PO Take 1 capsule by mouth daily.     [name] docusate sodium (COLACE) 100 MG capsule Take 1 capsule (100 mg total) by mouth 2 (two) times daily. 60 capsule 0  . FLAXSEED, LINSEED, PO Take 0.5 Scoops by mouth daily. PATIENT SPRINKLE FLAX SEEDS ONTO HER CEREAL DAILY     . Garlic 696 MG CAPS Take 500 mg by mouth daily.     . Multiple Minerals (MULTI-MINERALS PO) Take 1 tablet by mouth daily.    . polyethylene glycol (MIRALAX / GLYCOLAX) 17 g packet Take 17 g by mouth daily as needed for mild constipation. 14 each 0  . potassium chloride (K-DUR) 10 MEQ tablet Take 1 tablet (10 mEq total) by mouth daily as needed (take with furosemide). 90 tablet 3  . sulfamethoxazole-trimethoprim (BACTRIM DS) 800-160 MG tablet Take 1 tablet by mouth 2 (two) times daily. Take for 7 days. 14 tablet 0  . Zinc Sulfate (ZINC 15 PO) Take 50 mg by mouth daily.      No current facility-administered medications for this visit.      ALLERGIES: Eliquis [apixaban], Losartan, Maxzide [hydrochlorothiazide w-triamterene], and Metoprolol  Family History  Problem Relation Age of Onset  . Pancreatic cancer Mother   . Hypertension Mother   . Breast cancer Mother     Social History   Socioeconomic History  .  Marital status: Widowed    Spouse name: Not on file  . Number of children: Not on file  . Years of education: Not on file  . Highest education level: Not on file  Occupational History  . Occupation: Retired  Scientific laboratory technician  . Financial resource strain: Not on file  . Food insecurity    Worry: Not on file    Inability: Not on file  . Transportation needs    Medical: Not on file    Non-medical: Not  on file  Tobacco Use  . Smoking status: Never Smoker  . Smokeless tobacco: Never Used  Substance and Sexual Activity  . Alcohol use: No  . Drug use: Never  . Sexual activity: Not Currently    Birth control/protection: Post-menopausal  Lifestyle  . Physical activity    Days per week: Not on file    Minutes per session: Not on file  . Stress: Not on file  Relationships  . Social Herbalist on phone: Not on file    Gets together: Not on file    Attends religious service: Not on file    Active member of club or organization: Not on file    Attends meetings of clubs or organizations: Not on file    Relationship status: Not on file  . Intimate partner violence    Fear of current or ex partner: Not on file    Emotionally abused: Not on file    Physically abused: Not on file    Forced sexual activity: Not on file  Other Topics Concern  . Not on file  Social History Narrative   Not taking vaccines          ROS  PHYSICAL EXAMINATION:    BP 120/78 (BP Location: Right Arm, Patient Position: Sitting, Cuff Size: Large)   Pulse 82   Temp (!) 97.2 F (36.2 C) (Skin)   Wt 158 lb 6.4 oz (71.8 kg)   BMI 29.93 kg/m     General appearance: alert, cooperative and appears stated age  Pelvic: External genitalia:  no lesions              Urethra:  normal appearing urethra with no masses, tenderness or lesions              Bartholins and Skenes: normal                 Vagina: small amount of blood in her vagina. Cuff and anterior vagina are intact, no active bleeding. 2 small  areas on the anterior vaginal wall appear slightly friable, treated with silver nitrate.   Vaginal cuff is well supported and non tender, as is the anterior vaginal wall.             Cervix: absent              Bimanual Exam:  Uterus:  uterus absent              Adnexa: no mass, fullness, tenderness,                 Chaperone was present for exam.  Ultrasound with PVR of 77 cc  ASSESSMENT 2 weeks s/p TVH/Left salpingectomy/cystoscele repair (unable to remove her right adnexa or left ovary) Post op vaginal bleeding, no active bleeding on exam, 2 friable areas treated with silver nitrate Pre-op urinary retention has resolved Urinary incontinence is improving Healing well Incidental finding of FIGO grade I endometrial cancer on pathology with 3 mm of invasion (10 mm uterine wall)     PLAN Stop ASA for the next week Avoid straining and heavy lifting She has an appointment with GYN Oncology tomorrow F/U in one week, sooner with concerns.    An After Visit Summary was printed and given to the patient.  CC: Dr Alain Marion

## 2019-02-11 NOTE — Patient Instructions (Signed)
Stop the Asprin for the next week

## 2019-02-12 ENCOUNTER — Inpatient Hospital Stay: Payer: Medicare Other | Attending: Gynecologic Oncology | Admitting: Gynecologic Oncology

## 2019-02-12 ENCOUNTER — Encounter: Payer: Self-pay | Admitting: Gynecologic Oncology

## 2019-02-12 ENCOUNTER — Other Ambulatory Visit: Payer: Self-pay

## 2019-02-12 DIAGNOSIS — Z90710 Acquired absence of both cervix and uterus: Secondary | ICD-10-CM | POA: Diagnosis not present

## 2019-02-12 DIAGNOSIS — E785 Hyperlipidemia, unspecified: Secondary | ICD-10-CM | POA: Insufficient documentation

## 2019-02-12 DIAGNOSIS — N814 Uterovaginal prolapse, unspecified: Secondary | ICD-10-CM | POA: Diagnosis not present

## 2019-02-12 DIAGNOSIS — F419 Anxiety disorder, unspecified: Secondary | ICD-10-CM | POA: Insufficient documentation

## 2019-02-12 DIAGNOSIS — Z90721 Acquired absence of ovaries, unilateral: Secondary | ICD-10-CM | POA: Diagnosis not present

## 2019-02-12 DIAGNOSIS — E669 Obesity, unspecified: Secondary | ICD-10-CM | POA: Insufficient documentation

## 2019-02-12 DIAGNOSIS — I4891 Unspecified atrial fibrillation: Secondary | ICD-10-CM | POA: Insufficient documentation

## 2019-02-12 DIAGNOSIS — R269 Unspecified abnormalities of gait and mobility: Secondary | ICD-10-CM | POA: Insufficient documentation

## 2019-02-12 DIAGNOSIS — M1990 Unspecified osteoarthritis, unspecified site: Secondary | ICD-10-CM | POA: Diagnosis not present

## 2019-02-12 DIAGNOSIS — E538 Deficiency of other specified B group vitamins: Secondary | ICD-10-CM | POA: Insufficient documentation

## 2019-02-12 DIAGNOSIS — N184 Chronic kidney disease, stage 4 (severe): Secondary | ICD-10-CM

## 2019-02-12 DIAGNOSIS — I10 Essential (primary) hypertension: Secondary | ICD-10-CM | POA: Insufficient documentation

## 2019-02-12 DIAGNOSIS — Z79899 Other long term (current) drug therapy: Secondary | ICD-10-CM | POA: Diagnosis not present

## 2019-02-12 DIAGNOSIS — N819 Female genital prolapse, unspecified: Secondary | ICD-10-CM | POA: Diagnosis not present

## 2019-02-12 DIAGNOSIS — K760 Fatty (change of) liver, not elsewhere classified: Secondary | ICD-10-CM | POA: Diagnosis not present

## 2019-02-12 DIAGNOSIS — C541 Malignant neoplasm of endometrium: Secondary | ICD-10-CM

## 2019-02-12 NOTE — Patient Instructions (Signed)
Please follow-up with Dr. Talbert Nan in 6 months and return to see [place] in 1 year.  As we discussed in clinic today, I am happy to answer any questions that your family may have.  You do not need any additional treatment for this very early cancer.  The primary symptom that you will need to look out for is bleeding once you get out of the immediate postoperative period.

## 2019-02-14 ENCOUNTER — Other Ambulatory Visit: Payer: Self-pay

## 2019-02-17 NOTE — Progress Notes (Signed)
GYNECOLOGY  VISIT   HPI: 85 y.o.   Widowed White or Caucasian Not Hispanic or Latino  female   [phone] with No LMP recorded. Patient is postmenopausal.   here for f/u. She is 3 weeks s/p TVH/LS/cystocele repair. She had some post op urinary retention that resolved. Pathology returned with stage 1a, grade 1 endometrial adenocarcinoma. She had a consultation with Dr Alycia Rossetti in [place] last week who did not feel further surgery would be beneficial.   She is feeling well, her vaginal bleeding has stopped. She is urinating much better, leakage is continuing to improve. Now only up 1 x at night to void. BM have been fine. No pain.   GYNECOLOGIC HISTORY: No LMP recorded. Patient is postmenopausal. Contraception:PMP Menopausal hormone therapy: none        OB History    Gravida  1   Para  1   Term  1   Preterm      AB      Living  2     SAB      TAB      Ectopic      Multiple  1   Live Births  2              Patient Active Problem List   Diagnosis Date Noted  . Endometrial ca ([place]) 02/12/2019  . Genital prolapse 01/28/2019  . H/O total vaginal hysterectomy 01/28/2019  . Preop exam for internal medicine 01/23/2019  . Vaginal prolapse 11/13/2018  . Stasis dermatitis of both legs 02/18/2018  . Vitamin B12 deficiency 11/30/2017  . Glaucoma 07/31/2017  . Hearing loss 07/31/2017  . Gait disorder 07/31/2017  . Anxiety 02/01/2017  . Melena 05/05/2015  . Atrial fibrillation ([place]) 03/29/2015  . Elevated hemoglobin ([place]) 11/28/2014  . Pain in joint, ankle and foot 04/28/2014  . PVC's (premature ventricular contractions) 03/02/2014  . Abnormal echocardiogram 02/13/2014  . Chest pain, unspecified 01/30/2014  . Abdominal pain, chronic, epigastric 01/30/2014  . Tachycardia 01/30/2014  . Knee pain 12/18/2013  . Paresthesia 06/25/2013  . Obesity 12/11/2012  . Elevated glucose 12/11/2012  . URI (upper respiratory infection) 09/09/2012  . Vertigo 03/11/2012  . DOE  (dyspnea on exertion) 01/26/2012  . Neoplasm of uncertain behavior of skin 04/20/2011  . Ganglion cyst 12/22/2010  . Venous insufficiency of leg 09/21/2010  . Edema 09/21/2010  . SKIN RASH 09/07/2009  . FATTY LIVER DISEASE 12/22/2008  . OSTEOARTHRITIS 12/22/2008  . Cholelithiasis with chronic cholecystitis 07/01/2008  . Cystitis 02/27/2008  . Fatigue 02/27/2008  . ABNORMAL LIVER FUNCTION TESTS 02/27/2008  . Dyslipidemia 08/29/2007  . Disturbance of skin sensation 08/22/2007  . DEFICIENCY, VITAMIN D NOS 02/27/2007  . Essential hypertension 02/27/2007  . LOW BACK PAIN 02/27/2007    Past Medical History:  Diagnosis Date  . Anxiety   . Atrial fibrillation ([place])   . Cystocele with prolapse   . Dislocated shoulder 2012   right  . Edema    left leg  . Fatty liver   . Gait disturbance   . Gallstone    1.5 cm  . Ganglion cyst   . Glaucoma   . HTN (hypertension)   . Hyperlipidemia   . LBP (low back pain)   . Obesity   . Osteoarthritis   . Rosacea   . Shortness of breath    with activities and exertion, pt denies 01/23/2019  . Venous insufficiency of leg   . Vertigo    not current 01/23/2019  .  Vitamin B 12 deficiency     Past Surgical History:  Procedure Laterality Date  . CHOLECYSTECTOMY N/A 03/27/2014   Procedure: LAPAROSCOPIC CHOLECYSTECTOMY WITH INTRAOPERATIVE CHOLANGIOGRAM;  Surgeon: Fanny Skates, MD;  Location: [facility];  Service: General;  Laterality: N/A;  . CYSTOCELE REPAIR N/A 01/28/2019   Procedure: ANTERIOR REPAIR (CYSTOCELE);  Surgeon: [name], MD;  Location: [facility];  Service: Gynecology;  Laterality: N/A;  . CYSTOSCOPY N/A 01/28/2019   Procedure: CYSTOSCOPY;  Surgeon: [name], MD;  Location: [facility]  Service: Gynecology;  Laterality: N/A;  . TONSILLECTOMY    . VAGINAL HYSTERECTOMY Left 01/28/2019   Procedure: HYSTERECTOMY VAGINAL WITH LEFT SALPINGECTOMY;  Surgeon: [name], MD;   Location: [facility];  Service: Gynecology;  Laterality: Left;    Current Outpatient Medications  Medication Sig Dispense Refill  . acetaminophen (TYLENOL) 325 MG tablet Take 2 tablets (650 mg total) by mouth every 4 (four) hours as needed. (Patient taking differently: Take 650 mg by mouth every 4 (four) hours as needed for moderate pain or headache. ) 100 tablet 2  . Ascorbic Acid (VITAMIN C) 1000 MG tablet Take 1,000 mg by mouth daily.     . bumetanide (BUMEX) 0.5 MG tablet Take 1 tablet (0.5 mg total) by mouth daily with breakfast. 90 tablet 3  . Cod Liver Oil 1000 MG CAPS Take 1,000 mg by mouth daily.     . Coenzyme Q10 (CO Q 10 PO) Take 250 mg by mouth daily.     [name] CRANBERRY PO Take 1 capsule by mouth daily.     [name] docusate sodium (COLACE) 100 MG capsule Take 1 capsule (100 mg total) by mouth 2 (two) times daily. 60 capsule 0  . FLAXSEED, LINSEED, PO Take 0.5 Scoops by mouth daily. PATIENT SPRINKLE FLAX SEEDS ONTO HER CEREAL DAILY     . Garlic 536 MG CAPS Take 500 mg by mouth daily.     . Multiple Minerals (MULTI-MINERALS PO) Take 1 tablet by mouth daily.    . polyethylene glycol (MIRALAX / GLYCOLAX) 17 g packet Take 17 g by mouth daily as needed for mild constipation. 14 each 0  . potassium chloride (K-DUR) 10 MEQ tablet Take 1 tablet (10 mEq total) by mouth daily as needed (take with furosemide). 90 tablet 3  . sulfamethoxazole-trimethoprim (BACTRIM DS) 800-160 MG tablet Take 1 tablet by mouth 2 (two) times daily. Take for 7 days. 14 tablet 0  . Zinc Sulfate (ZINC 15 PO) Take 50 mg by mouth daily.      No current facility-administered medications for this visit.      ALLERGIES: Eliquis [apixaban], Losartan, Maxzide [hydrochlorothiazide w-triamterene], and Metoprolol  Family History  Problem Relation Age of Onset  . Pancreatic cancer Mother   . Hypertension Mother   . Breast cancer Mother   . Kidney failure Sister     Social History   Socioeconomic History  .  Marital status: Widowed    Spouse name: Not on file  . Number of children: Not on file  . Years of education: Not on file  . Highest education level: Not on file  Occupational History  . Occupation: Retired  Scientific laboratory technician  . Financial resource strain: Not on file  . Food insecurity    Worry: Not on file    Inability: Not on file  . Transportation needs    Medical: Not on file    Non-medical: Not on file  Tobacco  Use  . Smoking status: Never Smoker  . Smokeless tobacco: Never Used  Substance and Sexual Activity  . Alcohol use: No  . Drug use: Never  . Sexual activity: Not Currently    Birth control/protection: Post-menopausal  Lifestyle  . Physical activity    Days per week: Not on file    Minutes per session: Not on file  . Stress: Not on file  Relationships  . Social Herbalist on phone: Not on file    Gets together: Not on file    Attends religious service: Not on file    Active member of club or organization: Not on file    Attends meetings of clubs or organizations: Not on file    Relationship status: Not on file  . Intimate partner violence    Fear of current or ex partner: Not on file    Emotionally abused: Not on file    Physically abused: Not on file    Forced sexual activity: Not on file  Other Topics Concern  . Not on file  Social History Narrative   Not taking vaccines           PHYSICAL EXAMINATION:    BP 124/82 (BP Location: Right Arm, Patient Position: Sitting, Cuff Size: Normal)   Pulse 72   Temp (!) 97.2 F (36.2 C) (Skin)   Wt 158 lb 9.6 oz (71.9 kg)   BMI 30.97 kg/m     General appearance: alert, cooperative and appears stated age  ASSESSMENT 3 weeks s/p TVH/LS/cystocele repair H/O urinary retention, improved Stage 1a, grade 1 endometrial cancer. No further treatment recommended.     PLAN F/U for post op check in 2 weeks Then f/u 6 months post op.  F/U with gyn oncology in one year Call with any concerns  CC: Dr  Alain Marion   An After Visit Summary was printed and given to the patient.

## 2019-02-18 ENCOUNTER — Other Ambulatory Visit: Payer: Self-pay

## 2019-02-18 ENCOUNTER — Encounter: Payer: Self-pay | Admitting: Obstetrics and Gynecology

## 2019-02-18 ENCOUNTER — Ambulatory Visit (INDEPENDENT_AMBULATORY_CARE_PROVIDER_SITE_OTHER): Payer: Medicare Other | Admitting: Obstetrics and Gynecology

## 2019-02-18 VITALS — BP 124/82 | HR 72 | Temp 97.2°F | Wt 158.6 lb

## 2019-02-18 DIAGNOSIS — C541 Malignant neoplasm of endometrium: Secondary | ICD-10-CM

## 2019-02-18 DIAGNOSIS — Z90710 Acquired absence of both cervix and uterus: Secondary | ICD-10-CM

## 2019-02-18 DIAGNOSIS — Z87448 Personal history of other diseases of urinary system: Secondary | ICD-10-CM

## 2019-02-20 ENCOUNTER — Other Ambulatory Visit: Payer: Self-pay

## 2019-02-20 ENCOUNTER — Ambulatory Visit: Payer: Medicare Other

## 2019-02-20 ENCOUNTER — Ambulatory Visit (INDEPENDENT_AMBULATORY_CARE_PROVIDER_SITE_OTHER): Payer: Medicare Other

## 2019-02-20 DIAGNOSIS — E538 Deficiency of other specified B group vitamins: Secondary | ICD-10-CM

## 2019-02-20 MED ORDER — CYANOCOBALAMIN 1000 MCG/ML IJ SOLN
1000.0000 ug | Freq: Once | INTRAMUSCULAR | Status: AC
  Administered 2019-02-20: 1000 ug via INTRAMUSCULAR

## 2019-02-27 ENCOUNTER — Ambulatory Visit: Payer: Medicare Other

## 2019-03-04 ENCOUNTER — Other Ambulatory Visit: Payer: Self-pay

## 2019-03-04 ENCOUNTER — Encounter: Payer: Self-pay | Admitting: Obstetrics and Gynecology

## 2019-03-04 ENCOUNTER — Ambulatory Visit (INDEPENDENT_AMBULATORY_CARE_PROVIDER_SITE_OTHER): Payer: Medicare Other | Admitting: Obstetrics and Gynecology

## 2019-03-04 VITALS — BP 130/80 | HR 80 | Temp 97.2°F | Wt 158.0 lb

## 2019-03-04 DIAGNOSIS — Z87448 Personal history of other diseases of urinary system: Secondary | ICD-10-CM

## 2019-03-04 DIAGNOSIS — Z90710 Acquired absence of both cervix and uterus: Secondary | ICD-10-CM

## 2019-03-04 DIAGNOSIS — C541 Malignant neoplasm of endometrium: Secondary | ICD-10-CM

## 2019-03-04 NOTE — Progress Notes (Signed)
GYNECOLOGY  VISIT   HPI: 85 y.o.   Widowed White or Caucasian Not Hispanic or Latino  female   [phone] with No LMP recorded. Patient has had a hysterectomy.   here for a follow up appointment. She is 5 weeks s/p TVH/LS/cystocele repair. Pathology with stage 1a, grade 1 endometrial adenocarcinoma. She was seen by Dr Alycia Rossetti in [place] who didn't feel she needed more surgery.  Her urinary incontinence is better since her surgery. She is no longer wetting the bed at night. She is up to void 1 x a night (down from many times with big leakage). She may leak a couple of drops on the way to the bathroom.  She has some wetness in the pad and doesn't feel her self leak. A lot better than it was.  She feels she is emptying her bladder okay. No pain, no bleeding.   GYNECOLOGIC HISTORY: No LMP recorded. Patient has had a hysterectomy. Contraception: Hysterectomy Menopausal hormone therapy: None        OB History    Gravida  1   Para  1   Term  1   Preterm      AB      Living  2     SAB      TAB      Ectopic      Multiple  1   Live Births  2              Patient Active Problem List   Diagnosis Date Noted  . Endometrial ca ([place]) 02/12/2019  . Genital prolapse 01/28/2019  . H/O total vaginal hysterectomy 01/28/2019  . Preop exam for internal medicine 01/23/2019  . Vaginal prolapse 11/13/2018  . Stasis dermatitis of both legs 02/18/2018  . Vitamin B12 deficiency 11/30/2017  . Glaucoma 07/31/2017  . Hearing loss 07/31/2017  . Gait disorder 07/31/2017  . Anxiety 02/01/2017  . Melena 05/05/2015  . Atrial fibrillation ([place]) 03/29/2015  . Elevated hemoglobin ([place]) 11/28/2014  . Pain in joint, ankle and foot 04/28/2014  . PVC's (premature ventricular contractions) 03/02/2014  . Abnormal echocardiogram 02/13/2014  . Chest pain, unspecified 01/30/2014  . Abdominal pain, chronic, epigastric 01/30/2014  . Tachycardia 01/30/2014  . Knee pain 12/18/2013  . Paresthesia  06/25/2013  . Obesity 12/11/2012  . Elevated glucose 12/11/2012  . URI (upper respiratory infection) 09/09/2012  . Vertigo 03/11/2012  . DOE (dyspnea on exertion) 01/26/2012  . Neoplasm of uncertain behavior of skin 04/20/2011  . Ganglion cyst 12/22/2010  . Venous insufficiency of leg 09/21/2010  . Edema 09/21/2010  . SKIN RASH 09/07/2009  . FATTY LIVER DISEASE 12/22/2008  . OSTEOARTHRITIS 12/22/2008  . Cholelithiasis with chronic cholecystitis 07/01/2008  . Cystitis 02/27/2008  . Fatigue 02/27/2008  . ABNORMAL LIVER FUNCTION TESTS 02/27/2008  . Dyslipidemia 08/29/2007  . Disturbance of skin sensation 08/22/2007  . DEFICIENCY, VITAMIN D NOS 02/27/2007  . Essential hypertension 02/27/2007  . LOW BACK PAIN 02/27/2007    Past Medical History:  Diagnosis Date  . Anxiety   . Atrial fibrillation ([place])   . Cystocele with prolapse   . Dislocated shoulder 2012   right  . Edema    left leg  . Fatty liver   . Gait disturbance   . Gallstone    1.5 cm  . Ganglion cyst   . Glaucoma   . HTN (hypertension)   . Hyperlipidemia   . LBP (low back pain)   . Obesity   . Osteoarthritis   .  Rosacea   . Shortness of breath    with activities and exertion, pt denies 01/23/2019  . Venous insufficiency of leg   . Vertigo    not current 01/23/2019  . Vitamin B 12 deficiency     Past Surgical History:  Procedure Laterality Date  . ABDOMINAL HYSTERECTOMY    . CHOLECYSTECTOMY N/A 03/27/2014   Procedure: LAPAROSCOPIC CHOLECYSTECTOMY WITH INTRAOPERATIVE CHOLANGIOGRAM;  Surgeon: Fanny Skates, MD;  Location: [facility];  Service: General;  Laterality: N/A;  . CYSTOCELE REPAIR N/A 01/28/2019   Procedure: ANTERIOR REPAIR (CYSTOCELE);  Surgeon: [name], MD;  Location: [facility];  Service: Gynecology;  Laterality: N/A;  . CYSTOSCOPY N/A 01/28/2019   Procedure: CYSTOSCOPY;  Surgeon: [name], MD;  Location[facility];  Service: Gynecology;   Laterality: N/A;  . TONSILLECTOMY    . VAGINAL HYSTERECTOMY Left 01/28/2019   Procedure: HYSTERECTOMY VAGINAL WITH LEFT SALPINGECTOMY;  Surgeon: [name], MD;  Location: [facility];  Service: Gynecology;  Laterality: Left;    Current Outpatient Medications  Medication Sig Dispense Refill  . acetaminophen (TYLENOL) 325 MG tablet Take 2 tablets (650 mg total) by mouth every 4 (four) hours as needed. (Patient taking differently: Take 650 mg by mouth every 4 (four) hours as needed for moderate pain or headache. ) 100 tablet 2  . Ascorbic Acid (VITAMIN C) 1000 MG tablet Take 1,000 mg by mouth daily.     . bumetanide (BUMEX) 0.5 MG tablet Take 1 tablet (0.5 mg total) by mouth daily with breakfast. 90 tablet 3  . Cod Liver Oil 1000 MG CAPS Take 1,000 mg by mouth daily.     . Coenzyme Q10 (CO Q 10 PO) Take 250 mg by mouth daily.     [name] CRANBERRY PO Take 1 capsule by mouth daily.     [name] docusate sodium (COLACE) 100 MG capsule Take 1 capsule (100 mg total) by mouth 2 (two) times daily. 60 capsule 0  . FLAXSEED, LINSEED, PO Take 0.5 Scoops by mouth daily. PATIENT SPRINKLE FLAX SEEDS ONTO HER CEREAL DAILY     . Garlic 951 MG CAPS Take 500 mg by mouth daily.     . Multiple Minerals (MULTI-MINERALS PO) Take 1 tablet by mouth daily.    . polyethylene glycol (MIRALAX / GLYCOLAX) 17 g packet Take 17 g by mouth daily as needed for mild constipation. 14 each 0  . potassium chloride (K-DUR) 10 MEQ tablet Take 1 tablet (10 mEq total) by mouth daily as needed (take with furosemide). 90 tablet 3  . sulfamethoxazole-trimethoprim (BACTRIM DS) 800-160 MG tablet Take 1 tablet by mouth 2 (two) times daily. Take for 7 days. 14 tablet 0  . Zinc Sulfate (ZINC 15 PO) Take 50 mg by mouth daily.      No current facility-administered medications for this visit.      ALLERGIES: Eliquis [apixaban], Losartan, Maxzide [hydrochlorothiazide w-triamterene], and Metoprolol  Family History  Problem  Relation Age of Onset  . Pancreatic cancer Mother   . Hypertension Mother   . Breast cancer Mother   . Kidney failure Sister     Social History   Socioeconomic History  . Marital status: Widowed    Spouse name: Not on file  . Number of children: Not on file  . Years of education: Not on file  . Highest education level: Not on file  Occupational History  . Occupation: Retired  Scientific laboratory technician  . Financial resource strain:  Not on file  . Food insecurity    Worry: Not on file    Inability: Not on file  . Transportation needs    Medical: Not on file    Non-medical: Not on file  Tobacco Use  . Smoking status: Never Smoker  . Smokeless tobacco: Never Used  Substance and Sexual Activity  . Alcohol use: No  . Drug use: Never  . Sexual activity: Not Currently    Birth control/protection: Post-menopausal  Lifestyle  . Physical activity    Days per week: Not on file    Minutes per session: Not on file  . Stress: Not on file  Relationships  . Social Herbalist on phone: Not on file    Gets together: Not on file    Attends religious service: Not on file    Active member of club or organization: Not on file    Attends meetings of clubs or organizations: Not on file    Relationship status: Not on file  . Intimate partner violence    Fear of current or ex partner: Not on file    Emotionally abused: Not on file    Physically abused: Not on file    Forced sexual activity: Not on file  Other Topics Concern  . Not on file  Social History Narrative   Not taking vaccines          Review of Systems  Constitutional: Negative.   HENT: Negative.   Eyes: Negative.   Respiratory: Negative.   Cardiovascular: Negative.   Gastrointestinal: Negative.   Genitourinary: Negative.   Musculoskeletal: Negative.   Skin: Negative.   Neurological: Negative.   Endo/Heme/Allergies: Negative.   Psychiatric/Behavioral: Negative.     PHYSICAL EXAMINATION:    BP 130/80 (BP  Location: Right Arm, Patient Position: Sitting, Cuff Size: Normal)   Pulse 80   Temp (!) 97.2 F (36.2 C) (Skin)   Wt 158 lb (71.7 kg)   BMI 30.86 kg/m     General appearance: alert, cooperative and appears stated age  Pelvic: External genitalia:  no lesions              Urethra:  normal appearing urethra with no masses, tenderness or lesions              Bartholins and Skenes: normal                 Vagina: healing very well, well supported, no prolapse.               Cervix: absent              Bimanual Exam:  Uterus:  uterus absent              Adnexa: no mass, fullness, tenderness               Chaperone was present for exam.  ASSESSMENT 5 weeks s/p TVH/LS/cystocele repair, doing great Incidental finding of stage 1a, grade 1 endometrial cancer. No further surgery needed    PLAN F/U here in 6 months and with Gyn Oncology in 12 months Call with any concerns   An After Visit Summary was printed and given to the patient.

## 2019-03-08 NOTE — Progress Notes (Signed)
Medical screening examination/treatment/procedure(s) were performed by non-physician practitioner and as supervising physician I was immediately available for consultation/collaboration. I agree with above. Aleksei Plotnikov, MD  

## 2019-03-17 ENCOUNTER — Telehealth: Payer: Self-pay | Admitting: Internal Medicine

## 2019-03-17 ENCOUNTER — Ambulatory Visit: Payer: Medicare Other | Admitting: Internal Medicine

## 2019-03-17 NOTE — Telephone Encounter (Signed)
Pt son called and stated that he would like to have a call back from the nurse regarding if the pt should start taking Asprin after bladder surgery. Please advise

## 2019-03-18 NOTE — Telephone Encounter (Signed)
Please advise 

## 2019-03-19 NOTE — Telephone Encounter (Signed)
Yes EC baby ASA 81 mg/d Thx

## 2019-03-19 NOTE — Telephone Encounter (Signed)
Son notified 

## 2019-03-20 ENCOUNTER — Ambulatory Visit (INDEPENDENT_AMBULATORY_CARE_PROVIDER_SITE_OTHER): Payer: Medicare Other

## 2019-03-20 ENCOUNTER — Other Ambulatory Visit: Payer: Self-pay

## 2019-03-20 DIAGNOSIS — E538 Deficiency of other specified B group vitamins: Secondary | ICD-10-CM

## 2019-03-20 MED ORDER — CYANOCOBALAMIN 1000 MCG/ML IJ SOLN
1000.0000 ug | Freq: Once | INTRAMUSCULAR | Status: AC
  Administered 2019-03-20: 1000 ug via INTRAMUSCULAR

## 2019-03-31 NOTE — Progress Notes (Signed)
Medical screening examination/treatment/procedure(s) were performed by non-physician practitioner and as supervising physician I was immediately available for consultation/collaboration. I agree with above. Aleksei Plotnikov, MD  

## 2019-04-07 ENCOUNTER — Ambulatory Visit: Payer: Medicare Other | Admitting: Internal Medicine

## 2019-04-17 ENCOUNTER — Ambulatory Visit: Payer: Medicare Other | Admitting: Internal Medicine

## 2019-04-23 ENCOUNTER — Ambulatory Visit: Payer: Medicare Other

## 2019-04-24 ENCOUNTER — Encounter: Payer: Self-pay | Admitting: Internal Medicine

## 2019-04-24 ENCOUNTER — Other Ambulatory Visit: Payer: Self-pay

## 2019-04-24 ENCOUNTER — Ambulatory Visit (INDEPENDENT_AMBULATORY_CARE_PROVIDER_SITE_OTHER): Payer: Medicare Other | Admitting: Internal Medicine

## 2019-04-24 ENCOUNTER — Other Ambulatory Visit (INDEPENDENT_AMBULATORY_CARE_PROVIDER_SITE_OTHER): Payer: Medicare Other

## 2019-04-24 DIAGNOSIS — E538 Deficiency of other specified B group vitamins: Secondary | ICD-10-CM

## 2019-04-24 DIAGNOSIS — I10 Essential (primary) hypertension: Secondary | ICD-10-CM | POA: Diagnosis not present

## 2019-04-24 DIAGNOSIS — F419 Anxiety disorder, unspecified: Secondary | ICD-10-CM | POA: Diagnosis not present

## 2019-04-24 LAB — BASIC METABOLIC PANEL
BUN: 13 mg/dL (ref 6–23)
CO2: 31 mEq/L (ref 19–32)
Calcium: 9.8 mg/dL (ref 8.4–10.5)
Chloride: 102 mEq/L (ref 96–112)
Creatinine, Ser: 0.66 mg/dL (ref 0.40–1.20)
GFR: 85.35 mL/min (ref >60.00)
Glucose, Bld: 92 mg/dL (ref 70–99)
Potassium: 3.7 mEq/L (ref 3.5–5.1)
Sodium: 141 mEq/L (ref 135–145)

## 2019-04-24 MED ORDER — POTASSIUM CHLORIDE ER 10 MEQ PO TBCR: 10.0000 meq | EXTENDED_RELEASE_TABLET | Freq: Every day | ORAL | 3 refills | Status: DC

## 2019-04-24 MED ORDER — BUMETANIDE 0.5 MG PO TABS: 0.5000 mg | ORAL_TABLET | Freq: Every day | ORAL | 3 refills | Status: DC

## 2019-04-24 MED ORDER — CYANOCOBALAMIN 1000 MCG/ML IJ SOLN
1000.0000 ug | Freq: Once | INTRAMUSCULAR | Status: AC
  Administered 2019-04-24: 16:00:00 1000 ug via INTRAMUSCULAR

## 2019-04-24 MED ORDER — TORSEMIDE 20 MG PO TABS: 20.0000 mg | ORAL_TABLET | Freq: Every day | ORAL | 3 refills | Status: DC

## 2019-04-24 NOTE — Progress Notes (Signed)
Subjective:  Patient ID: Kathleen Mejia, female    DOB: Feb 06, 1934  Age: 85 y.o. MRN: [id number]  CC: No chief complaint on file.   HPI Kathleen Mejia presents for uterine cancer, incontinence - healed up well F/u HTN, edema Meds make her dizzy  Outpatient Medications Prior to Visit  Medication Sig Dispense Refill   acetaminophen (TYLENOL) 325 MG tablet Take 2 tablets (650 mg total) by mouth every 4 (four) hours as needed. (Patient taking differently: Take 650 mg by mouth every 4 (four) hours as needed for moderate pain or headache. ) 100 tablet 2   Ascorbic Acid (VITAMIN C) 1000 MG tablet Take 1,000 mg by mouth daily.      bumetanide (BUMEX) 0.5 MG tablet Take 1 tablet (0.5 mg total) by mouth daily with breakfast. 90 tablet 3   Cod Liver Oil 1000 MG CAPS Take 1,000 mg by mouth daily.      Coenzyme Q10 (CO Q 10 PO) Take 250 mg by mouth daily.      CRANBERRY PO Take 1 capsule by mouth daily.      docusate sodium (COLACE) 100 MG capsule Take 1 capsule (100 mg total) by mouth 2 (two) times daily. 60 capsule 0   FLAXSEED, LINSEED, PO Take 0.5 Scoops by mouth daily. PATIENT SPRINKLE FLAX SEEDS ONTO HER CEREAL DAILY      Garlic 003 MG CAPS Take 500 mg by mouth daily.      Multiple Minerals (MULTI-MINERALS PO) Take 1 tablet by mouth daily.     polyethylene glycol (MIRALAX / GLYCOLAX) 17 g packet Take 17 g by mouth daily as needed for mild constipation. 14 each 0   potassium chloride (K-DUR) 10 MEQ tablet Take 1 tablet (10 mEq total) by mouth daily as needed (take with furosemide). 90 tablet 3   sulfamethoxazole-trimethoprim (BACTRIM DS) 800-160 MG tablet Take 1 tablet by mouth 2 (two) times daily. Take for 7 days. 14 tablet 0   Zinc Sulfate (ZINC 15 PO) Take 50 mg by mouth daily.      No facility-administered medications prior to visit.     ROS: Review of Systems  Constitutional: Positive for fatigue and unexpected weight change. Negative for activity change, appetite  change and chills.  HENT: Negative for congestion, mouth sores and sinus pressure.   Eyes: Negative for visual disturbance.  Respiratory: Negative for cough and chest tightness.   Cardiovascular: Positive for leg swelling.  Gastrointestinal: Negative for abdominal pain and nausea.  Genitourinary: Negative for difficulty urinating, frequency and vaginal pain.  Musculoskeletal: Negative for back pain and gait problem.  Skin: Negative for pallor and rash.  Neurological: Positive for dizziness, weakness and light-headedness. Negative for tremors, numbness and headaches.  Psychiatric/Behavioral: Negative for confusion and sleep disturbance.    Objective:  BP 126/76 (BP Location: Left Arm, Patient Position: Sitting, Cuff Size: Large)    Pulse 95    Temp 98.1 F (36.7 C) (Oral)    Ht 5' (1.524 m)    Wt 169 lb (76.7 kg)    SpO2 96%    BMI 33.01 kg/m   BP Readings from Last 3 Encounters:  04/24/19 126/76  03/04/19 130/80  02/18/19 124/82    Wt Readings from Last 3 Encounters:  04/24/19 169 lb (76.7 kg)  03/04/19 158 lb (71.7 kg)  02/18/19 158 lb 9.6 oz (71.9 kg)    Physical Exam  Lab Results  Component Value Date   WBC 12.7 (H) 02/04/2019   HGB 10.2 (  L) 02/04/2019   HCT 30.9 (L) 02/04/2019   PLT 251 02/04/2019   GLUCOSE 128 (H) 02/04/2019   CHOL 175 11/27/2014   TRIG 103.0 11/27/2014   HDL 50.40 11/27/2014   LDLDIRECT 139.3 02/21/2008   LDLCALC 104 (H) 11/27/2014   ALT 18 01/29/2019   AST 18 01/29/2019   NA 135 02/04/2019   K 2.7 (LL) 02/04/2019   CL 92 (L) 02/04/2019   CREATININE 0.69 02/04/2019   BUN 11 02/04/2019   CO2 30 02/04/2019   TSH 1.77 07/31/2017   HGBA1C 5.5 01/29/2019   MICROALBUR 0.4 06/25/2008    No results found.  Assessment & Plan:   There are no diagnoses linked to this encounter.   No orders of the defined types were placed in this encounter.    Follow-up: No follow-ups on file.  Walker Kehr, MD

## 2019-04-24 NOTE — Assessment & Plan Note (Signed)
Bumex, Torsemide  

## 2019-04-24 NOTE — Assessment & Plan Note (Signed)
Valerian root 

## 2019-04-24 NOTE — Addendum Note (Signed)
Addended by: Karren Cobble on: 04/24/2019 04:30 PM   Modules accepted: Orders

## 2019-04-24 NOTE — Assessment & Plan Note (Signed)
On B12 inj 

## 2019-05-26 ENCOUNTER — Ambulatory Visit: Payer: Medicare Other

## 2019-05-27 ENCOUNTER — Ambulatory Visit (INDEPENDENT_AMBULATORY_CARE_PROVIDER_SITE_OTHER): Payer: Medicare Other

## 2019-05-27 ENCOUNTER — Other Ambulatory Visit: Payer: Self-pay

## 2019-05-27 DIAGNOSIS — E538 Deficiency of other specified B group vitamins: Secondary | ICD-10-CM | POA: Diagnosis not present

## 2019-05-27 MED ORDER — CYANOCOBALAMIN 1000 MCG/ML IJ SOLN
1000.0000 ug | Freq: Once | INTRAMUSCULAR | Status: AC
  Administered 2019-05-27: 16:00:00 1000 ug via INTRAMUSCULAR

## 2019-06-18 NOTE — Progress Notes (Signed)
Medical screening examination/treatment/procedure(s) were performed by non-physician practitioner and as supervising physician I was immediately available for consultation/collaboration. I agree with above. Nafis Farnan, MD  

## 2019-06-26 ENCOUNTER — Ambulatory Visit: Payer: Medicare Other

## 2019-07-02 ENCOUNTER — Other Ambulatory Visit: Payer: Self-pay

## 2019-07-02 ENCOUNTER — Ambulatory Visit (INDEPENDENT_AMBULATORY_CARE_PROVIDER_SITE_OTHER): Payer: Medicare Other | Admitting: *Deleted

## 2019-07-02 DIAGNOSIS — E538 Deficiency of other specified B group vitamins: Secondary | ICD-10-CM

## 2019-07-02 MED ORDER — CYANOCOBALAMIN 1000 MCG/ML IJ SOLN
1000.0000 ug | Freq: Once | INTRAMUSCULAR | Status: AC
  Administered 2019-07-02: 15:00:00 1000 ug via INTRAMUSCULAR

## 2019-07-28 ENCOUNTER — Ambulatory Visit: Payer: Medicare Other | Admitting: Internal Medicine

## 2019-07-30 ENCOUNTER — Ambulatory Visit: Payer: Medicare Other | Admitting: Internal Medicine

## 2019-07-31 ENCOUNTER — Ambulatory Visit: Payer: Medicare Other

## 2019-08-06 ENCOUNTER — Other Ambulatory Visit: Payer: Self-pay

## 2019-08-06 ENCOUNTER — Ambulatory Visit (INDEPENDENT_AMBULATORY_CARE_PROVIDER_SITE_OTHER): Payer: Medicare Other | Admitting: Internal Medicine

## 2019-08-06 ENCOUNTER — Encounter: Payer: Self-pay | Admitting: Internal Medicine

## 2019-08-06 VITALS — BP 134/82 | HR 99 | Temp 97.9°F | Ht 60.0 in | Wt 177.0 lb

## 2019-08-06 DIAGNOSIS — R7309 Other abnormal glucose: Secondary | ICD-10-CM | POA: Diagnosis not present

## 2019-08-06 DIAGNOSIS — L304 Erythema intertrigo: Secondary | ICD-10-CM | POA: Diagnosis not present

## 2019-08-06 DIAGNOSIS — E538 Deficiency of other specified B group vitamins: Secondary | ICD-10-CM

## 2019-08-06 DIAGNOSIS — Z6835 Body mass index (BMI) 35.0-35.9, adult: Secondary | ICD-10-CM

## 2019-08-06 LAB — GLUCOSE, POCT (MANUAL RESULT ENTRY): POC Glucose: 93 mg/dl (ref 70–99)

## 2019-08-06 MED ORDER — CYANOCOBALAMIN 1000 MCG/ML IJ SOLN
1000.0000 ug | Freq: Once | INTRAMUSCULAR | Status: AC
  Administered 2019-08-06: 1000 ug via INTRAMUSCULAR

## 2019-08-06 MED ORDER — KETOCONAZOLE 2 % EX CREA: 1.0000 "application " | TOPICAL_CREAM | Freq: Two times a day (BID) | CUTANEOUS | 1 refills | Status: AC

## 2019-08-06 NOTE — Assessment & Plan Note (Signed)
CBG 96 today

## 2019-08-06 NOTE — Progress Notes (Signed)
Subjective:  Patient ID: Kathleen Mejia, female    DOB: [birth year]  Age: 86 y.o. MRN: [id number]  CC: No chief complaint on file.   HPI Kathleen Mejia presents for rash under R breast - new F/u HTN, B12 def  Outpatient Medications Prior to Visit  Medication Sig Dispense Refill  . acetaminophen (TYLENOL) 325 MG tablet Take 2 tablets (650 mg total) by mouth every 4 (four) hours as needed. (Patient taking differently: Take 650 mg by mouth every 4 (four) hours as needed for moderate pain or headache. ) 100 tablet 2  . Ascorbic Acid (VITAMIN C) 1000 MG tablet Take 1,000 mg by mouth daily.     . bumetanide (BUMEX) 0.5 MG tablet Take 1 tablet (0.5 mg total) by mouth daily with breakfast. 90 tablet 3  . Cod Liver Oil 1000 MG CAPS Take 1,000 mg by mouth daily.     . Coenzyme Q10 (CO Q 10 PO) Take 250 mg by mouth daily.     [name] CRANBERRY PO Take 1 capsule by mouth daily.     [name] docusate sodium (COLACE) 100 MG capsule Take 1 capsule (100 mg total) by mouth 2 (two) times daily. 60 capsule 0  . FLAXSEED, LINSEED, PO Take 0.5 Scoops by mouth daily. PATIENT SPRINKLE FLAX SEEDS ONTO HER CEREAL DAILY     . Garlic 267 MG CAPS Take 500 mg by mouth daily.     . Multiple Minerals (MULTI-MINERALS PO) Take 1 tablet by mouth daily.    . polyethylene glycol (MIRALAX / GLYCOLAX) 17 g packet Take 17 g by mouth daily as needed for mild constipation. 14 each 0  . potassium chloride (KLOR-CON) 10 MEQ tablet Take 1 tablet (10 mEq total) by mouth daily. 90 tablet 3  . sulfamethoxazole-trimethoprim (BACTRIM DS) 800-160 MG tablet Take 1 tablet by mouth 2 (two) times daily. Take for 7 days. 14 tablet 0  . torsemide (DEMADEX) 20 MG tablet Take 1 tablet (20 mg total) by mouth daily with breakfast. 90 tablet 3  . Zinc Sulfate (ZINC 15 PO) Take 50 mg by mouth daily.      No facility-administered medications prior to visit.    ROS: Review of Systems  Constitutional: Positive for unexpected weight change. Negative for  activity change, appetite change, chills and fatigue.  HENT: Negative for congestion, mouth sores and sinus pressure.   Eyes: Negative for visual disturbance.  Respiratory: Negative for cough and chest tightness.   Gastrointestinal: Negative for abdominal pain and nausea.  Genitourinary: Negative for difficulty urinating, frequency and vaginal pain.  Musculoskeletal: Negative for back pain and gait problem.  Skin: Positive for rash. Negative for pallor.  Neurological: Positive for weakness. Negative for dizziness, tremors, numbness and headaches.  Psychiatric/Behavioral: Negative for confusion and sleep disturbance.    Objective:  BP 134/82 (BP Location: Left Arm, Patient Position: Sitting, Cuff Size: Normal)   Pulse 99   Temp 97.9 F (36.6 C) (Oral)   Ht 5' (1.524 m)   Wt 177 lb (80.3 kg)   SpO2 97%   BMI 34.57 kg/m   BP Readings from Last 3 Encounters:  08/06/19 134/82  04/24/19 126/76  03/04/19 130/80    Wt Readings from Last 3 Encounters:  08/06/19 177 lb (80.3 kg)  04/24/19 169 lb (76.7 kg)  03/04/19 158 lb (71.7 kg)    Physical Exam Constitutional:      General: She is not in acute distress.    Appearance: She is well-developed. She is  obese.  HENT:     Head: Normocephalic.     Right Ear: External ear normal.     Left Ear: External ear normal.     Nose: Nose normal.  Eyes:     General:        Right eye: No discharge.        Left eye: No discharge.     Conjunctiva/sclera: Conjunctivae normal.     Pupils: Pupils are equal, round, and reactive to light.  Neck:     Thyroid: No thyromegaly.     Vascular: No JVD.     Trachea: No tracheal deviation.  Cardiovascular:     Rate and Rhythm: Normal rate and regular rhythm.     Heart sounds: Normal heart sounds.  Pulmonary:     Effort: No respiratory distress.     Breath sounds: No stridor. No wheezing.  Abdominal:     General: Bowel sounds are normal. There is no distension.     Palpations: Abdomen is soft.  There is no mass.     Tenderness: There is no abdominal tenderness. There is no guarding or rebound.  Musculoskeletal:        General: No tenderness.     Cervical back: Normal range of motion and neck supple.  Lymphadenopathy:     Cervical: No cervical adenopathy.  Skin:    Findings: Erythema and rash present.  Neurological:     Cranial Nerves: No cranial nerve deficit.     Motor: No abnormal muscle tone.     Coordination: Coordination abnormal.     Deep Tendon Reflexes: Reflexes normal.  Psychiatric:        Behavior: Behavior normal.        Thought Content: Thought content normal.        Judgment: Judgment normal.   intertrigo under R breast  Lab Results  Component Value Date   WBC 12.7 (H) 02/04/2019   HGB 10.2 (L) 02/04/2019   HCT 30.9 (L) 02/04/2019   PLT 251 02/04/2019   GLUCOSE 92 04/24/2019   CHOL 175 11/27/2014   TRIG 103.0 11/27/2014   HDL 50.40 11/27/2014   LDLDIRECT 139.3 02/21/2008   LDLCALC 104 (H) 11/27/2014   ALT 18 01/29/2019   AST 18 01/29/2019   NA 141 04/24/2019   K 3.7 04/24/2019   CL 102 04/24/2019   CREATININE 0.66 04/24/2019   BUN 13 04/24/2019   CO2 31 04/24/2019   TSH 1.77 07/31/2017   HGBA1C 5.5 01/29/2019   MICROALBUR 0.4 06/25/2008    No results found.  Assessment & Plan:     Follow-up: No follow-ups on file.  Walker Kehr, MD

## 2019-08-06 NOTE — Assessment & Plan Note (Signed)
Ketocon cream

## 2019-08-06 NOTE — Patient Instructions (Signed)
Intertrigo Intertrigo is skin irritation (inflammation) that happens in warm, moist areas of the body. The irritation can cause a rash and make skin raw and itchy. The rash is usually pink or red. It happens mostly between folds of skin or where skin rubs together, such as:  Between the toes.  In the armpits.  In the groin area.  Under the belly.  Under the breasts.  Around the butt area. This condition is not passed from person to person (is not contagious). What are the causes?  Heat, moisture, rubbing, and not enough air movement.  The condition can be made worse by: ? Sweat. ? Bacteria. ? A fungus, such as yeast. What increases the risk?  Moisture in your skin folds.  You are more likely to develop this condition if you: ? Have diabetes. ? Are overweight. ? Are not able to move around. ? Live in a warm and moist climate. ? Wear splints, braces, or other medical devices. ? Are not able to control your pee (urine) or poop (stool). What are the signs or symptoms?  A pink or red skin rash in the skin fold or near the skin fold.  Raw or scaly skin.  Itching.  A burning feeling.  Bleeding.  Leaking fluid.  A bad smell. How is this treated?  Cleaning and drying your skin.  Taking an antibiotic medicine or using an antibiotic skin cream for a bacterial infection.  Using an antifungal cream on your skin or taking pills for an infection that was caused by a fungus, such as yeast.  Using a steroid ointment to stop the itching and irritation.  Separating the skin fold with a clean cotton cloth to absorb moisture and allow air to flow into the area. Follow these instructions at home:  Keep the affected area clean and dry.  Do not scratch your skin.  Stay cool as much as you can. Use an air conditioner or a fan, if you have one.  Apply over-the-counter and prescription medicines only as told by your doctor.  If you were prescribed an antibiotic medicine,  use it as told by your doctor. Do not stop using the antibiotic even if your condition starts to get better.  Keep all follow-up visits as told by your doctor. This is important. How is this prevented?   Stay at a healthy weight.  Take care of your feet. This is very important if you have diabetes. You should: ? Wear shoes that fit well. ? Keep your feet dry. ? Wear clean cotton or wool socks.  Protect the skin in your groin and butt area as told by your doctor. To do this: ? Follow a regular cleaning routine. ? Use creams, powders, or ointments that protect your skin. ? Change protection pads often.  Do not wear tight clothes. Wear clothes that: ? Are loose. ? Take moisture away from your body. ? Are made of cotton.  Wear a bra that gives good support, if needed.  Shower and dry yourself well after being active. Use a hair dryer on a cool setting to dry between skin folds.  Keep your blood sugar under control if you have diabetes. Contact a doctor if:  Your symptoms do not get better with treatment.  Your symptoms get worse or they spread.  You notice more redness and warmth.  You have a fever. Summary  Intertrigo is skin irritation that occurs when folds of skin rub together.  This condition is caused by heat, moisture,   and rubbing.  This condition may be treated by cleaning and drying your skin and with medicines.  Apply over-the-counter and prescription medicines only as told by your doctor.  Keep all follow-up visits as told by your doctor. This is important. This information is not intended to replace advice given to you by your health care provider. Make sure you discuss any questions you have with your health care provider. Document Revised: 02/28/2018 Document Reviewed: 02/28/2018 Elsevier Patient Education  2020 Elsevier Inc.  

## 2019-08-06 NOTE — Assessment & Plan Note (Signed)
B12 given via injection

## 2019-08-06 NOTE — Assessment & Plan Note (Signed)
Wt Readings from Last 3 Encounters:  08/06/19 177 lb (80.3 kg)  04/24/19 169 lb (76.7 kg)  03/04/19 158 lb (71.7 kg)

## 2019-08-10 ENCOUNTER — Other Ambulatory Visit: Payer: Self-pay

## 2019-08-10 ENCOUNTER — Ambulatory Visit: Payer: Medicare Other | Attending: Internal Medicine

## 2019-08-10 DIAGNOSIS — Z23 Encounter for immunization: Secondary | ICD-10-CM | POA: Insufficient documentation

## 2019-08-10 NOTE — Progress Notes (Signed)
   Covid-19 Vaccination Clinic  Name:  Juliya Magill Eppinger    MRN: [id number] DOB: 1933-09-28  08/10/2019  Ms. Jafri was observed post Covid-19 immunization for 15 minutes without incident. She was provided with Vaccine Information Sheet and instruction to access the V-Safe system.   Ms. Frakes was instructed to call 911 with any severe reactions post vaccine: [name] Difficulty breathing  . Swelling of face and throat  . A fast heartbeat  . A bad rash all over body  . Dizziness and weakness   Immunizations Administered    Name Date Dose VIS Date Route   Pfizer COVID-19 Vaccine 08/10/2019  2:16 PM 0.3 mL 05/16/2019 Intramuscular   Manufacturer: [place]   Lot: [id number]   [place]: [id number]

## 2019-08-18 ENCOUNTER — Telehealth: Payer: Self-pay | Admitting: Internal Medicine

## 2019-08-18 DIAGNOSIS — R269 Unspecified abnormalities of gait and mobility: Secondary | ICD-10-CM

## 2019-08-18 DIAGNOSIS — M5440 Lumbago with sciatica, unspecified side: Secondary | ICD-10-CM

## 2019-08-18 DIAGNOSIS — G8929 Other chronic pain: Secondary | ICD-10-CM

## 2019-08-18 NOTE — Telephone Encounter (Signed)
New message:   Pt's son in calling to see if an order can be put in for the patient to have a home health nurse to help her bath and walk. Please advise.

## 2019-08-18 NOTE — Telephone Encounter (Signed)
Please advise 

## 2019-08-19 NOTE — Telephone Encounter (Signed)
We can do PT/OT.  Home health aid is not a covered service. Thx

## 2019-08-19 NOTE — Telephone Encounter (Signed)
LMTCB

## 2019-08-22 NOTE — Telephone Encounter (Signed)
    Please return call to patients son [name]

## 2019-08-23 NOTE — Telephone Encounter (Signed)
lvm for pt son to call back with any questions regarding the message. (may inform son of same if he calls back).  "Dr. Alain Marion is okay with ordering PT or OT. However, if the request is for [facility] to feed, bath and clean - insurance will not cover and that would be an out of pocket expense"

## 2019-08-25 NOTE — Telephone Encounter (Signed)
They are okay wit PT/OT referral

## 2019-08-25 NOTE — Telephone Encounter (Signed)
Patient's son, [name], calling back. Informed of below message. States that they are just wanting PT and OT at this time. Does not have any other questions at this time.

## 2019-08-25 NOTE — Telephone Encounter (Signed)
Is it home OT/PT or OP?

## 2019-08-26 NOTE — Telephone Encounter (Signed)
Home pt/ot please

## 2019-08-28 NOTE — Telephone Encounter (Signed)
Ok Thx 

## 2019-09-01 ENCOUNTER — Telehealth: Payer: Self-pay | Admitting: Internal Medicine

## 2019-09-01 ENCOUNTER — Ambulatory Visit: Payer: Medicare Other | Admitting: Obstetrics and Gynecology

## 2019-09-01 NOTE — Telephone Encounter (Signed)
Kathleen Mejia with [facility] [phone]  called and said that there would be a delay In the patients occupational therapy do to short staffing. They should be able to stat care in 3-4 weeks.

## 2019-09-01 NOTE — Telephone Encounter (Signed)
Okay to delay [facility] or would you like it sent to another agency?

## 2019-09-02 NOTE — Telephone Encounter (Signed)
We can try another agency.  Thanks

## 2019-09-03 NOTE — Telephone Encounter (Signed)
Can we send [facility] referral to a different agency?

## 2019-09-09 ENCOUNTER — Ambulatory Visit: Payer: Medicare Other | Attending: Internal Medicine

## 2019-09-09 DIAGNOSIS — Z23 Encounter for immunization: Secondary | ICD-10-CM

## 2019-09-09 NOTE — Telephone Encounter (Signed)
F/u  Duplicate request for home health agency.   Current standing with  [facility] they made one visit. They unable to reach the patient   Cancel out [place] home health

## 2019-09-09 NOTE — Progress Notes (Signed)
   Covid-19 Vaccination Clinic  Name:  Kathleen Mejia    MRN: [id number] DOB: Nov 03, 1933  09/09/2019  Kathleen Mejia was observed post Covid-19 immunization for 15 minutes without incident. She was provided with Vaccine Information Sheet and instruction to access the V-Safe system.   Kathleen Mejia was instructed to call 911 with any severe reactions post vaccine: [name] Difficulty breathing  . Swelling of face and throat  . A fast heartbeat  . A bad rash all over body  . Dizziness and weakness   Immunizations Administered    Name Date Dose VIS Date Route   Pfizer COVID-19 Vaccine 09/09/2019  3:06 PM 0.3 mL 05/16/2019 Intramuscular   Manufacturer: [employer], [employer]   Lot: [id number]   [place]: [id number]

## 2019-09-10 NOTE — Telephone Encounter (Signed)
Noted.. forwarding msg to Cecille Rubin [facility] cancel [facility] referral. Pt has standing HH w/[facility].Marland KitchenJohny Mejia

## 2019-09-30 ENCOUNTER — Other Ambulatory Visit: Payer: Self-pay | Admitting: Internal Medicine

## 2019-09-30 DIAGNOSIS — Z1231 Encounter for screening mammogram for malignant neoplasm of breast: Secondary | ICD-10-CM

## 2019-10-03 NOTE — Progress Notes (Deleted)
GYNECOLOGY  VISIT   HPI: 86 y.o.   Widowed White or Caucasian Not Hispanic or Latino  female   [phone] with No LMP recorded. Patient has had a hysterectomy.   here for   6 month follow up   GYNECOLOGIC HISTORY: No LMP recorded. Patient has had a hysterectomy. Contraception: hysterectomy  Menopausal hormone therapy: none        OB History    Gravida  1   Para  1   Term  1   Preterm      AB      Living  2     SAB      TAB      Ectopic      Multiple  1   Live Births  2              Patient Active Problem List   Diagnosis Date Noted  . Intertrigo 08/06/2019  . Endometrial ca ([place]) 02/12/2019  . Genital prolapse 01/28/2019  . H/O total vaginal hysterectomy 01/28/2019  . Preop exam for internal medicine 01/23/2019  . Vaginal prolapse 11/13/2018  . Stasis dermatitis of both legs 02/18/2018  . Vitamin B12 deficiency 11/30/2017  . Glaucoma 07/31/2017  . Hearing loss 07/31/2017  . Gait disorder 07/31/2017  . Anxiety 02/01/2017  . Melena 05/05/2015  . Atrial fibrillation ([place]) 03/29/2015  . Elevated hemoglobin ([place]) 11/28/2014  . Pain in joint, ankle and foot 04/28/2014  . PVC's (premature ventricular contractions) 03/02/2014  . Abnormal echocardiogram 02/13/2014  . Chest pain, unspecified 01/30/2014  . Abdominal pain, chronic, epigastric 01/30/2014  . Tachycardia 01/30/2014  . Knee pain 12/18/2013  . Paresthesia 06/25/2013  . Obesity 12/11/2012  . Elevated glucose 12/11/2012  . URI (upper respiratory infection) 09/09/2012  . Vertigo 03/11/2012  . DOE (dyspnea on exertion) 01/26/2012  . Neoplasm of uncertain behavior of skin 04/20/2011  . Ganglion cyst 12/22/2010  . Venous insufficiency of leg 09/21/2010  . Edema 09/21/2010  . SKIN RASH 09/07/2009  . FATTY LIVER DISEASE 12/22/2008  . OSTEOARTHRITIS 12/22/2008  . Cholelithiasis with chronic cholecystitis 07/01/2008  . Cystitis 02/27/2008  . Fatigue 02/27/2008  . ABNORMAL LIVER FUNCTION TESTS  02/27/2008  . Dyslipidemia 08/29/2007  . Disturbance of skin sensation 08/22/2007  . DEFICIENCY, VITAMIN D NOS 02/27/2007  . Essential hypertension 02/27/2007  . LOW BACK PAIN 02/27/2007    Past Medical History:  Diagnosis Date  . Anxiety   . Atrial fibrillation ([place])   . Cystocele with prolapse   . Dislocated shoulder 2012   right  . Edema    left leg  . Fatty liver   . Gait disturbance   . Gallstone    1.5 cm  . Ganglion cyst   . Glaucoma   . HTN (hypertension)   . Hyperlipidemia   . LBP (low back pain)   . Obesity   . Osteoarthritis   . Rosacea   . Shortness of breath    with activities and exertion, pt denies 01/23/2019  . Venous insufficiency of leg   . Vertigo    not current 01/23/2019  . Vitamin B 12 deficiency     Past Surgical History:  Procedure Laterality Date  . ABDOMINAL HYSTERECTOMY    . CHOLECYSTECTOMY N/A 03/27/2014   Procedure: LAPAROSCOPIC CHOLECYSTECTOMY WITH INTRAOPERATIVE CHOLANGIOGRAM;  Surgeon: Fanny Skates, MD;  Location: [place];  Service: General;  Laterality: N/A;  . CYSTOCELE REPAIR N/A 01/28/2019   Procedure: ANTERIOR REPAIR (CYSTOCELE);  Surgeon: [name],  MD;  Location: [place];  Service: Gynecology;  Laterality: N/A;  . CYSTOSCOPY N/A 01/28/2019   Procedure: CYSTOSCOPY;  Surgeon: [name], MD;  Location: [facility];  Service: Gynecology;  Laterality: N/A;  . TONSILLECTOMY    . VAGINAL HYSTERECTOMY Left 01/28/2019   Procedure: HYSTERECTOMY VAGINAL WITH LEFT SALPINGECTOMY;  Surgeon: [name], MD;  Location: [facility];  Service: Gynecology;  Laterality: Left;    Current Outpatient Medications  Medication Sig Dispense Refill  . acetaminophen (TYLENOL) 325 MG tablet Take 2 tablets (650 mg total) by mouth every 4 (four) hours as needed. (Patient taking differently: Take 650 mg by mouth every 4 (four) hours as needed for moderate pain or headache. ) 100  tablet 2  . Ascorbic Acid (VITAMIN C) 1000 MG tablet Take 1,000 mg by mouth daily.     . bumetanide (BUMEX) 0.5 MG tablet Take 1 tablet (0.5 mg total) by mouth daily with breakfast. 90 tablet 3  . Cod Liver Oil 1000 MG CAPS Take 1,000 mg by mouth daily.     . Coenzyme Q10 (CO Q 10 PO) Take 250 mg by mouth daily.     [name] CRANBERRY PO Take 1 capsule by mouth daily.     [name] docusate sodium (COLACE) 100 MG capsule Take 1 capsule (100 mg total) by mouth 2 (two) times daily. 60 capsule 0  . FLAXSEED, LINSEED, PO Take 0.5 Scoops by mouth daily. PATIENT SPRINKLE FLAX SEEDS ONTO HER CEREAL DAILY     . Garlic 591 MG CAPS Take 500 mg by mouth daily.     [name] ketoconazole (NIZORAL) 2 % cream Apply 1 application topically 2 (two) times daily. 45 g 1  . Multiple Minerals (MULTI-MINERALS PO) Take 1 tablet by mouth daily.    . polyethylene glycol (MIRALAX / GLYCOLAX) 17 g packet Take 17 g by mouth daily as needed for mild constipation. 14 each 0  . potassium chloride (KLOR-CON) 10 MEQ tablet Take 1 tablet (10 mEq total) by mouth daily. 90 tablet 3  . sulfamethoxazole-trimethoprim (BACTRIM DS) 800-160 MG tablet Take 1 tablet by mouth 2 (two) times daily. Take for 7 days. 14 tablet 0  . torsemide (DEMADEX) 20 MG tablet Take 1 tablet (20 mg total) by mouth daily with breakfast. 90 tablet 3  . Zinc Sulfate (ZINC 15 PO) Take 50 mg by mouth daily.      No current facility-administered medications for this visit.     ALLERGIES: Eliquis [apixaban], Losartan, Maxzide [hydrochlorothiazide w-triamterene], and Metoprolol  Family History  Problem Relation Age of Onset  . Pancreatic cancer Mother   . Hypertension Mother   . Breast cancer Mother   . Kidney failure Sister     Social History   Socioeconomic History  . Marital status: Widowed    Spouse name: Not on file  . Number of children: Not on file  . Years of education: Not on file  . Highest education level: Not on file  Occupational History  . Occupation:  Retired  Tobacco Use  . Smoking status: Never Smoker  . Smokeless tobacco: Never Used  Substance and Sexual Activity  . Alcohol use: No  . Drug use: Never  . Sexual activity: Not Currently    Birth control/protection: Post-menopausal  Other Topics Concern  . Not on file  Social History Narrative   Not taking vaccines         Social Determinants of Radio broadcast assistant  Strain:   . Difficulty of Paying Living Expenses:   Food Insecurity:   . Worried About Charity fundraiser in the Last Year:   . Arboriculturist in the Last Year:   Transportation Needs:   . Film/video editor (Medical):   [name] Lack of Transportation (Non-Medical):   Physical Activity:   . Days of Exercise per Week:   . Minutes of Exercise per Session:   Stress:   . Feeling of Stress :   Social Connections:   . Frequency of Communication with Friends and Family:   . Frequency of Social Gatherings with Friends and Family:   . Attends Religious Services:   . Active Member of Clubs or Organizations:   . Attends Archivist Meetings:   [name] Marital Status:   Intimate Partner Violence:   . Fear of Current or Ex-Partner:   . Emotionally Abused:   [name] Physically Abused:   . Sexually Abused:     ROS  PHYSICAL EXAMINATION:    There were no vitals taken for this visit.    General appearance: alert, cooperative and appears stated age Neck: no adenopathy, supple, symmetrical, trachea midline and thyroid {CHL AMB PHY EX THYROID NORM [phone]::"normal to inspection and palpation"} Breasts: {Exam; breast:13139::"normal appearance, no masses or tenderness"} Abdomen: soft, non-tender; non distended, no masses,  no organomegaly  Pelvic: External genitalia:  no lesions              Urethra:  normal appearing urethra with no masses, tenderness or lesions              Bartholins and Skenes: normal                 Vagina: normal appearing vagina with normal color and discharge, no lesions               Cervix: {CHL AMB PHY EX CERVIX NORM DEFAULT:[phone]::"no lesions"}              Bimanual Exam:  Uterus:  {CHL AMB PHY EX UTERUS NORM DEFAULT:[phone]::"normal size, contour, position, consistency, mobility, non-tender"}              Adnexa: {CHL AMB PHY EX ADNEXA NO MASS DEFAULT:[phone]::"no mass, fullness, tenderness"}              Rectovaginal: {yes no:[id number]}.  Confirms.              Anus:  normal sphincter tone, no lesions  Chaperone was present for exam.  ASSESSMENT     PLAN    An After Visit Summary was printed and given to the patient.  *** minutes face to face time of which over 50% was spent in counseling.

## 2019-10-06 ENCOUNTER — Telehealth: Payer: Self-pay

## 2019-10-06 ENCOUNTER — Ambulatory Visit: Payer: Medicare Other | Admitting: Obstetrics and Gynecology

## 2019-10-06 NOTE — Telephone Encounter (Signed)
Patients son called to cancel today's appointment (10/06/19) due to not feeling well. Rescheduled to (10/23/19).Patient apologizes for cancellation.

## 2019-10-15 ENCOUNTER — Ambulatory Visit (INDEPENDENT_AMBULATORY_CARE_PROVIDER_SITE_OTHER): Payer: Medicare Other | Admitting: *Deleted

## 2019-10-15 ENCOUNTER — Other Ambulatory Visit: Payer: Self-pay

## 2019-10-15 DIAGNOSIS — E538 Deficiency of other specified B group vitamins: Secondary | ICD-10-CM

## 2019-10-15 MED ORDER — CYANOCOBALAMIN 1000 MCG/ML IJ SOLN
1000.0000 ug | Freq: Once | INTRAMUSCULAR | Status: AC
  Administered 2019-10-15: 1000 ug via INTRAMUSCULAR

## 2019-10-15 NOTE — Progress Notes (Signed)
Pls cosign for B12 inj../lmb  

## 2019-10-23 ENCOUNTER — Ambulatory Visit: Payer: Medicare Other | Admitting: Obstetrics and Gynecology

## 2019-11-05 ENCOUNTER — Ambulatory Visit: Payer: Medicare Other

## 2019-11-12 ENCOUNTER — Ambulatory Visit (INDEPENDENT_AMBULATORY_CARE_PROVIDER_SITE_OTHER): Payer: Medicare Other | Admitting: *Deleted

## 2019-11-12 ENCOUNTER — Other Ambulatory Visit: Payer: Self-pay

## 2019-11-12 ENCOUNTER — Ambulatory Visit
Admission: RE | Admit: 2019-11-12 | Discharge: 2019-11-12 | Disposition: A | Discharge status: Home / Self Care | Payer: Medicare Other | Source: Ambulatory Visit | Attending: Internal Medicine | Admitting: Internal Medicine

## 2019-11-12 DIAGNOSIS — E538 Deficiency of other specified B group vitamins: Secondary | ICD-10-CM

## 2019-11-12 DIAGNOSIS — Z1231 Encounter for screening mammogram for malignant neoplasm of breast: Secondary | ICD-10-CM | POA: Diagnosis not present

## 2019-11-12 MED ORDER — CYANOCOBALAMIN 1000 MCG/ML IJ SOLN
1000.0000 ug | Freq: Once | INTRAMUSCULAR | Status: AC
  Administered 2019-11-12: 1000 ug via INTRAMUSCULAR

## 2019-11-12 NOTE — Progress Notes (Addendum)
Pls cosign for B12 inj../lmb   Medical screening examination/treatment/procedure(s) were performed by non-physician practitioner and as supervising physician I was immediately available for consultation/collaboration.  I agree with above. Aleksei Plotnikov, MD  

## 2019-11-19 ENCOUNTER — Other Ambulatory Visit: Payer: Self-pay

## 2019-11-19 ENCOUNTER — Encounter: Payer: Self-pay | Admitting: Obstetrics and Gynecology

## 2019-11-19 ENCOUNTER — Ambulatory Visit (INDEPENDENT_AMBULATORY_CARE_PROVIDER_SITE_OTHER): Payer: Medicare Other | Admitting: Obstetrics and Gynecology

## 2019-11-19 VITALS — BP 133/72 | HR 124 | Temp 97.7°F | Ht 60.0 in | Wt 178.0 lb

## 2019-11-19 DIAGNOSIS — Z90710 Acquired absence of both cervix and uterus: Secondary | ICD-10-CM

## 2019-11-19 DIAGNOSIS — C541 Malignant neoplasm of endometrium: Secondary | ICD-10-CM | POA: Diagnosis not present

## 2019-11-19 NOTE — Progress Notes (Signed)
GYNECOLOGY  VISIT   HPI: 86 y.o.   Widowed White or Caucasian Not Hispanic or Latino  female   [phone] with No LMP recorded. Patient has had a hysterectomy.   here for follow up pelvic exam.   She is s/p TVH/LS/cystocele repair in 8/20.  Pathology with stage 1a, grade 1 endometrial adenocarcinoma. She was seen by Dr Alycia Rossetti in [place] who didn't feel she needed more surgery. She is to be seen her now and in Oncology in 6 months.  She is having normal BM with miralax.  Voiding well. Some urge incontinence, but much better than prior to her surgery. No vaginal bleeding. No prolapse.   GYNECOLOGIC HISTORY: No LMP recorded. Patient has had a hysterectomy. Contraception: NA Menopausal hormone therapy: none         OB History    Gravida  1   Para  1   Term  1   Preterm      AB      Living  2     SAB      TAB      Ectopic      Multiple  1   Live Births  2              Patient Active Problem List   Diagnosis Date Noted  . Intertrigo 08/06/2019  . Endometrial ca ([place]) 02/12/2019  . Genital prolapse 01/28/2019  . H/O total vaginal hysterectomy 01/28/2019  . Preop exam for internal medicine 01/23/2019  . Vaginal prolapse 11/13/2018  . Stasis dermatitis of both legs 02/18/2018  . Vitamin B12 deficiency 11/30/2017  . Glaucoma 07/31/2017  . Hearing loss 07/31/2017  . Gait disorder 07/31/2017  . Anxiety 02/01/2017  . Melena 05/05/2015  . Atrial fibrillation ([place]) 03/29/2015  . Elevated hemoglobin ([place]) 11/28/2014  . Pain in joint, ankle and foot 04/28/2014  . PVC's (premature ventricular contractions) 03/02/2014  . Abnormal echocardiogram 02/13/2014  . Chest pain, unspecified 01/30/2014  . Abdominal pain, chronic, epigastric 01/30/2014  . Tachycardia 01/30/2014  . Knee pain 12/18/2013  . Paresthesia 06/25/2013  . Obesity 12/11/2012  . Elevated glucose 12/11/2012  . URI (upper respiratory infection) 09/09/2012  . Vertigo 03/11/2012  . DOE (dyspnea on  exertion) 01/26/2012  . Neoplasm of uncertain behavior of skin 04/20/2011  . Ganglion cyst 12/22/2010  . Venous insufficiency of leg 09/21/2010  . Edema 09/21/2010  . SKIN RASH 09/07/2009  . FATTY LIVER DISEASE 12/22/2008  . OSTEOARTHRITIS 12/22/2008  . Cholelithiasis with chronic cholecystitis 07/01/2008  . Cystitis 02/27/2008  . Fatigue 02/27/2008  . ABNORMAL LIVER FUNCTION TESTS 02/27/2008  . Dyslipidemia 08/29/2007  . Disturbance of skin sensation 08/22/2007  . DEFICIENCY, VITAMIN D NOS 02/27/2007  . Essential hypertension 02/27/2007  . LOW BACK PAIN 02/27/2007    Past Medical History:  Diagnosis Date  . Anxiety   . Atrial fibrillation ([place])   . Cystocele with prolapse   . Dislocated shoulder 2012   right  . Edema    left leg  . Fatty liver   . Gait disturbance   . Gallstone    1.5 cm  . Ganglion cyst   . Glaucoma   . HTN (hypertension)   . Hyperlipidemia   . LBP (low back pain)   . Obesity   . Osteoarthritis   . Rosacea   . Shortness of breath    with activities and exertion, pt denies 01/23/2019  . Venous insufficiency of leg   . Vertigo  not current 01/23/2019  . Vitamin B 12 deficiency     Past Surgical History:  Procedure Laterality Date  . ABDOMINAL HYSTERECTOMY    . CHOLECYSTECTOMY N/A 03/27/2014   Procedure: LAPAROSCOPIC CHOLECYSTECTOMY WITH INTRAOPERATIVE CHOLANGIOGRAM;  Surgeon: Fanny Skates, MD;  Location: [place];  Service: General;  Laterality: N/A;  . CYSTOCELE REPAIR N/A 01/28/2019   Procedure: ANTERIOR REPAIR (CYSTOCELE);  Surgeon: [name], MD;  Location: [facility];  Service: Gynecology;  Laterality: N/A;  . CYSTOSCOPY N/A 01/28/2019   Procedure: CYSTOSCOPY;  Surgeon: [name], MD;  Location: [facility];  Service: Gynecology;  Laterality: N/A;  . TONSILLECTOMY    . VAGINAL HYSTERECTOMY Left 01/28/2019   Procedure: HYSTERECTOMY VAGINAL WITH LEFT SALPINGECTOMY;  Surgeon: [name], MD;  Location: [facility];  Service: Gynecology;  Laterality: Left;    Current Outpatient Medications  Medication Sig Dispense Refill  . acetaminophen (TYLENOL) 325 MG tablet Take 2 tablets (650 mg total) by mouth every 4 (four) hours as needed. (Patient taking differently: Take 650 mg by mouth every 4 (four) hours as needed for moderate pain or headache. ) 100 tablet 2  . Ascorbic Acid (VITAMIN C) 1000 MG tablet Take 1,000 mg by mouth daily.     . bumetanide (BUMEX) 0.5 MG tablet Take 1 tablet (0.5 mg total) by mouth daily with breakfast. 90 tablet 3  . Cod Liver Oil 1000 MG CAPS Take 1,000 mg by mouth daily.     . Coenzyme Q10 (CO Q 10 PO) Take 250 mg by mouth daily.     [name] CRANBERRY PO Take 1 capsule by mouth daily.     [name] docusate sodium (COLACE) 100 MG capsule Take 1 capsule (100 mg total) by mouth 2 (two) times daily. 60 capsule 0  . FLAXSEED, LINSEED, PO Take 0.5 Scoops by mouth daily. PATIENT SPRINKLE FLAX SEEDS ONTO HER CEREAL DAILY     . Garlic 696 MG CAPS Take 500 mg by mouth daily.     [name] ketoconazole (NIZORAL) 2 % cream Apply 1 application topically 2 (two) times daily. 45 g 1  . Multiple Minerals (MULTI-MINERALS PO) Take 1 tablet by mouth daily.    . polyethylene glycol (MIRALAX / GLYCOLAX) 17 g packet Take 17 g by mouth daily as needed for mild constipation. 14 each 0  . potassium chloride (KLOR-CON) 10 MEQ tablet Take 1 tablet (10 mEq total) by mouth daily. 90 tablet 3  . sulfamethoxazole-trimethoprim (BACTRIM DS) 800-160 MG tablet Take 1 tablet by mouth 2 (two) times daily. Take for 7 days. 14 tablet 0  . torsemide (DEMADEX) 20 MG tablet Take 1 tablet (20 mg total) by mouth daily with breakfast. 90 tablet 3  . Zinc Sulfate (ZINC 15 PO) Take 50 mg by mouth daily.      No current facility-administered medications for this visit.     ALLERGIES: Eliquis [apixaban], Losartan, Maxzide [hydrochlorothiazide w-triamterene], and Metoprolol  Family History   Problem Relation Age of Onset  . Pancreatic cancer Mother   . Hypertension Mother   . Breast cancer Mother   . Kidney failure Sister     Social History   Socioeconomic History  . Marital status: Widowed    Spouse name: Not on file  . Number of children: Not on file  . Years of education: Not on file  . Highest education level: Not on file  Occupational History  . Occupation: Retired  Tobacco Use  .  Smoking status: Never Smoker  . Smokeless tobacco: Never Used  Vaping Use  . Vaping Use: Never used  Substance and Sexual Activity  . Alcohol use: No  . Drug use: Never  . Sexual activity: Not Currently    Birth control/protection: Post-menopausal  Other Topics Concern  . Not on file  Social History Narrative   Not taking vaccines         Social Determinants of Health   Financial Resource Strain:   . Difficulty of Paying Living Expenses:   Food Insecurity:   . Worried About Charity fundraiser in the Last Year:   . Arboriculturist in the Last Year:   Transportation Needs:   . Film/video editor (Medical):   [name] Lack of Transportation (Non-Medical):   Physical Activity:   . Days of Exercise per Week:   . Minutes of Exercise per Session:   Stress:   . Feeling of Stress :   Social Connections:   . Frequency of Communication with Friends and Family:   . Frequency of Social Gatherings with Friends and Family:   . Attends Religious Services:   . Active Member of Clubs or Organizations:   . Attends Archivist Meetings:   [name] Marital Status:   Intimate Partner Violence:   . Fear of Current or Ex-Partner:   . Emotionally Abused:   [name] Physically Abused:   . Sexually Abused:     Review of Systems  All other systems reviewed and are negative.   PHYSICAL EXAMINATION:    There were no vitals taken for this visit.    General appearance: alert, cooperative and appears stated age  Pelvic: External genitalia:  no lesions              Urethra:  normal  appearing urethra with no masses, tenderness or lesions              Bartholins and Skenes: normal                 Vagina: normal appearing vagina with normal color and discharge, no lesions. Mild atrophy. No prolapse.               Cervix: absent              Bimanual Exam:  Uterus:  uterus absent              Adnexa: no mass, fullness, tenderness                Chaperone was present for exam.  ASSESSMENT H/O TVH/cystocele repair. Pathology with 1a, grade 1 endometrial adenocarcinoma. Normal exam today    PLAN F/U with Oncology in 6 months F/U here in one year

## 2019-11-20 ENCOUNTER — Encounter: Payer: Self-pay | Admitting: Obstetrics and Gynecology

## 2019-12-18 ENCOUNTER — Ambulatory Visit (INDEPENDENT_AMBULATORY_CARE_PROVIDER_SITE_OTHER): Payer: Medicare Other | Admitting: *Deleted

## 2019-12-18 ENCOUNTER — Other Ambulatory Visit: Payer: Self-pay

## 2019-12-18 DIAGNOSIS — E538 Deficiency of other specified B group vitamins: Secondary | ICD-10-CM

## 2019-12-18 MED ORDER — CYANOCOBALAMIN 1000 MCG/ML IJ SOLN
1000.0000 ug | Freq: Once | INTRAMUSCULAR | Status: AC
  Administered 2019-12-18: 1000 ug via INTRAMUSCULAR

## 2019-12-18 NOTE — Progress Notes (Addendum)
Pls cosign for B12 inj../lmb   Medical screening examination/treatment/procedure(s) were performed by non-physician practitioner and as supervising physician I was immediately available for consultation/collaboration.  I agree with above. Aleksei Plotnikov, MD  

## 2019-12-19 ENCOUNTER — Telehealth: Payer: Self-pay | Admitting: Internal Medicine

## 2019-12-19 ENCOUNTER — Telehealth: Payer: Self-pay | Admitting: Obstetrics and Gynecology

## 2019-12-19 NOTE — Telephone Encounter (Signed)
Per review of Epic, patient is scheduled for OV w/ PCP on 12/22/19.

## 2019-12-19 NOTE — Telephone Encounter (Signed)
LVM at the number that called this am requesting a call back to office

## 2019-12-19 NOTE — Telephone Encounter (Signed)
Spoke with patients son, [name], ok per dpr. States his mother received her monthly B12 injection on 7/15, they went to eat and then home. Reports his sister called to speak with his mother a couple of hours later, reports she was confused. He is unsure if this was because she was awaken from a nap. Reports frequent urination and fatigue. Patient has eaten more sugar over the past few days.  States she is not confused today. Denies fever/chills, N/V. He reports that the patient states she is not going to the doctor, declines OV today. Bettina Gavia patient does need further evaluation. Advised confusion, fatigue and frequent urination can be signs of UTI or other health conditions.  Recommended return call to PCP since patient was seen on 12/18/19 for an injection. ER/Urgent care precautions reviewed. Advised I will review with covering provider and return call if any additional recommendations. [name] is agreeable.   Routing to Dr. Sabra Heck for final review.

## 2019-12-19 NOTE — Telephone Encounter (Signed)
Patient's son [name] (on dpr) says she is having uti symptoms. She has been tired, confused, and frequent urination. He also want to note that patient does not want to come in the office. His number is [phone]

## 2019-12-19 NOTE — Telephone Encounter (Signed)
New message:   Pt's son is calling and states the pt came in on yesterday and got a B12 injection. He states after that injection they got something to eat and the pt took a nap. He also states when the pt woke up she was a little confused and thought she had lost a lot of time in the day. States she was saying some off the wall things and he says she is doing better today but is wondering should she come in to see Dr. Camila Li. Please advise.

## 2019-12-19 NOTE — Telephone Encounter (Signed)
[name] returned phone call; states the pt came in on 7/15 for scheduled monthly B12 injection. He states after that injection they got something to eat and the pt took a nap. He also states when the pt woke up she was a little confused and thought she had lost a lot of time in the day; recalled son dropping her off after lunch, but was unclear of the timing of when that happened; she also was very concerned on why she was so tired & speculated that we must have confused the B12 with fentanyl or that [employer] must have put fentanyl in her food.  Son denies signs of stroke (slurring, decreased extremity movements, facial drooping) at the time of confusion.  Son states patient was up cleaning a lot yesterday morning when she should not have been. He says she is doing better today. He also states that the patient is c/o of new onset urinary frequency.  Recommendation made for patient to come in today for evaluation, however, son states that he has already talked to his mom & she has refused to come in today.  Appt made for Mon am for eval of above & pt instructed to call after hrs nurse if ques/concerns this weekend.  Son verb understanding.

## 2019-12-21 NOTE — Telephone Encounter (Signed)
Noted. Agree. Thanks.

## 2019-12-22 ENCOUNTER — Ambulatory Visit: Payer: Medicare Other | Admitting: Internal Medicine

## 2020-01-21 ENCOUNTER — Ambulatory Visit (INDEPENDENT_AMBULATORY_CARE_PROVIDER_SITE_OTHER): Payer: Medicare Other | Admitting: *Deleted

## 2020-01-21 ENCOUNTER — Other Ambulatory Visit: Payer: Self-pay

## 2020-01-21 DIAGNOSIS — E538 Deficiency of other specified B group vitamins: Secondary | ICD-10-CM

## 2020-01-21 MED ORDER — CYANOCOBALAMIN 1000 MCG/ML IJ SOLN
1000.0000 ug | Freq: Once | INTRAMUSCULAR | Status: AC
  Administered 2020-01-21: 1000 ug via INTRAMUSCULAR

## 2020-01-21 NOTE — Progress Notes (Addendum)
Pls cosign for B12 inj../lmb   Medical screening examination/treatment/procedure(s) were performed by non-physician practitioner and as supervising physician I was immediately available for consultation/collaboration.  I agree with above. Aleksei Plotnikov, MD  

## 2020-02-23 ENCOUNTER — Ambulatory Visit: Payer: Medicare Other

## 2020-03-24 ENCOUNTER — Other Ambulatory Visit: Payer: Self-pay

## 2020-03-24 ENCOUNTER — Ambulatory Visit (INDEPENDENT_AMBULATORY_CARE_PROVIDER_SITE_OTHER): Payer: Medicare Other | Admitting: *Deleted

## 2020-03-24 DIAGNOSIS — E538 Deficiency of other specified B group vitamins: Secondary | ICD-10-CM | POA: Diagnosis not present

## 2020-03-24 MED ORDER — CYANOCOBALAMIN 1000 MCG/ML IJ SOLN
1000.0000 ug | Freq: Once | INTRAMUSCULAR | Status: AC
  Administered 2020-03-24: 1000 ug via INTRAMUSCULAR

## 2020-03-24 NOTE — Progress Notes (Addendum)
Pls cosign for B12 inj../lmb   Medical screening examination/treatment/procedure(s) were performed by non-physician practitioner and as supervising physician I was immediately available for consultation/collaboration.  I agree with above. Aleksei Plotnikov, MD  

## 2020-04-27 ENCOUNTER — Telehealth: Payer: Self-pay | Admitting: Internal Medicine

## 2020-04-27 NOTE — Telephone Encounter (Addendum)
   Patient's son [name] calling Should patient still be taking Lasix or torsemide Demadex? Patient has swelling in legs  Please advise, and call to Pharmacy on file

## 2020-04-27 NOTE — Telephone Encounter (Signed)
Demadex is stronger. Demadex if it is working [employer]

## 2020-04-27 NOTE — Telephone Encounter (Signed)
Called pt son he states no mom is actually taking Bumetanide. If Demadex is better he is wanting rx sent to [facility]. Also want to know if she need to continue the Bumetanide.Marland KitchenJohny Chess

## 2020-04-28 ENCOUNTER — Other Ambulatory Visit: Payer: Self-pay

## 2020-04-28 ENCOUNTER — Ambulatory Visit (INDEPENDENT_AMBULATORY_CARE_PROVIDER_SITE_OTHER): Payer: Medicare Other

## 2020-04-28 ENCOUNTER — Encounter (INDEPENDENT_AMBULATORY_CARE_PROVIDER_SITE_OTHER): Payer: Self-pay

## 2020-04-28 DIAGNOSIS — E538 Deficiency of other specified B group vitamins: Secondary | ICD-10-CM

## 2020-04-28 MED ORDER — CYANOCOBALAMIN 1000 MCG/ML IJ SOLN
1000.0000 ug | Freq: Once | INTRAMUSCULAR | Status: AC
  Administered 2020-04-28: 1000 ug via INTRAMUSCULAR

## 2020-04-28 NOTE — Telephone Encounter (Signed)
It depends. Is her swelling controlled with bumetanide?

## 2020-04-28 NOTE — Progress Notes (Addendum)
B12 Given and tolerated well  Medical screening examination/treatment/procedure(s) were performed by non-physician practitioner and as supervising physician I was immediately available for consultation/collaboration.  I agree with above. Lew Dawes, MD

## 2020-05-04 ENCOUNTER — Other Ambulatory Visit: Payer: Self-pay

## 2020-05-04 ENCOUNTER — Ambulatory Visit: Payer: Medicare Other | Admitting: Internal Medicine

## 2020-05-04 ENCOUNTER — Encounter (INDEPENDENT_AMBULATORY_CARE_PROVIDER_SITE_OTHER): Payer: Self-pay

## 2020-05-04 ENCOUNTER — Encounter: Payer: Self-pay | Admitting: Internal Medicine

## 2020-05-04 DIAGNOSIS — R413 Other amnesia: Secondary | ICD-10-CM

## 2020-05-04 DIAGNOSIS — F0280 Dementia in other diseases classified elsewhere without behavioral disturbance: Secondary | ICD-10-CM | POA: Insufficient documentation

## 2020-05-04 DIAGNOSIS — G459 Transient cerebral ischemic attack, unspecified: Secondary | ICD-10-CM | POA: Diagnosis not present

## 2020-05-04 DIAGNOSIS — E538 Deficiency of other specified B group vitamins: Secondary | ICD-10-CM | POA: Diagnosis not present

## 2020-05-04 MED ORDER — ASPIRIN EC 81 MG PO TBEC: 162.0000 mg | DELAYED_RELEASE_TABLET | Freq: Every day | ORAL | 3 refills | Status: AC

## 2020-05-04 NOTE — Patient Instructions (Signed)
You can try [place] Mushroom capsules for memory, focus

## 2020-05-04 NOTE — Progress Notes (Signed)
Subjective:  Patient ID: Kathleen Mejia, female    DOB: 1934-04-18  Age: 86 y.o. MRN: [id number]  CC: Acute Visit (bilateral leg/ankles x 1 month.  hasn't been takeing any fluid pills.  alson c/o that she could of had a TIA this weekend (multiple symptoms)  )   HPI Anthonia Monger [place] presents for an episode with missing objects on Fri last wk - it has resolved after 1 1/2 h. On ASA 81 mg/d  Outpatient Medications Prior to Visit  Medication Sig Dispense Refill  . Ascorbic Acid (VITAMIN C) 1000 MG tablet Take 1,000 mg by mouth daily.     . bumetanide (BUMEX) 0.5 MG tablet Take 1 tablet (0.5 mg total) by mouth daily with breakfast. 90 tablet 3  . Cod Liver Oil 1000 MG CAPS Take 1,000 mg by mouth daily.     . Coenzyme Q10 (CO Q 10 PO) Take 250 mg by mouth daily.     [name] CRANBERRY PO Take 1 capsule by mouth daily.     [name] FLAXSEED, LINSEED, PO Take 0.5 Scoops by mouth daily. PATIENT SPRINKLE FLAX SEEDS ONTO HER CEREAL DAILY     . Garlic 517 MG CAPS Take 500 mg by mouth daily.     . Multiple Minerals (MULTI-MINERALS PO) Take 1 tablet by mouth daily.    . polyethylene glycol (MIRALAX / GLYCOLAX) 17 g packet Take 17 g by mouth daily as needed for mild constipation. 14 each 0  . potassium chloride (KLOR-CON) 10 MEQ tablet Take 1 tablet (10 mEq total) by mouth daily. 90 tablet 3  . Zinc Sulfate (ZINC 15 PO) Take 50 mg by mouth daily.     [name] docusate sodium (COLACE) 100 MG capsule Take 1 capsule (100 mg total) by mouth 2 (two) times daily. (Patient not taking: Reported on 05/04/2020) 60 capsule 0  . ketoconazole (NIZORAL) 2 % cream Apply 1 application topically 2 (two) times daily. (Patient not taking: Reported on 05/04/2020) 45 g 1   No facility-administered medications prior to visit.    ROS: Review of Systems  Constitutional: Positive for fatigue. Negative for activity change, appetite change, chills and unexpected weight change.  HENT: Negative for congestion, mouth sores and sinus pressure.    Eyes: Negative for visual disturbance.  Respiratory: Negative for cough and chest tightness.   Gastrointestinal: Negative for abdominal pain and nausea.  Genitourinary: Negative for difficulty urinating, frequency and vaginal pain.  Musculoskeletal: Positive for back pain and gait problem.  Skin: Negative for pallor and rash.  Neurological: Positive for weakness. Negative for dizziness, tremors, numbness and headaches.  Psychiatric/Behavioral: Positive for decreased concentration. Negative for confusion, sleep disturbance and suicidal ideas. The patient is not nervous/anxious.     Objective:  BP 130/74   Pulse 72   Temp 98.1 F (36.7 C) (Oral)   Ht 5' (1.524 m)   Wt 172 lb 9.6 oz (78.3 kg)   SpO2 96%   BMI 33.71 kg/m   BP Readings from Last 3 Encounters:  05/04/20 130/74  11/19/19 133/72  08/06/19 134/82    Wt Readings from Last 3 Encounters:  05/04/20 172 lb 9.6 oz (78.3 kg)  11/19/19 178 lb (80.7 kg)  08/06/19 177 lb (80.3 kg)    Physical Exam Constitutional:      General: She is not in acute distress.    Appearance: She is well-developed. She is obese.  HENT:     Head: Normocephalic.     Right Ear: External ear normal.  Left Ear: External ear normal.     Nose: Nose normal.  Eyes:     General:        Right eye: No discharge.        Left eye: No discharge.     Conjunctiva/sclera: Conjunctivae normal.     Pupils: Pupils are equal, round, and reactive to light.  Neck:     Thyroid: No thyromegaly.     Vascular: No JVD.     Trachea: No tracheal deviation.  Cardiovascular:     Rate and Rhythm: Normal rate and regular rhythm.     Heart sounds: Normal heart sounds.  Pulmonary:     Effort: No respiratory distress.     Breath sounds: No stridor. No wheezing.  Abdominal:     General: Bowel sounds are normal. There is no distension.     Palpations: Abdomen is soft. There is no mass.     Tenderness: There is no abdominal tenderness. There is no guarding or  rebound.  Musculoskeletal:        General: Tenderness present.     Cervical back: Normal range of motion and neck supple.  Lymphadenopathy:     Cervical: No cervical adenopathy.  Skin:    Findings: No erythema or rash.  Neurological:     Mental Status: She is disoriented.     Cranial Nerves: No cranial nerve deficit.     Motor: No abnormal muscle tone.     Coordination: Coordination abnormal.     Gait: Gait abnormal.     Deep Tendon Reflexes: Reflexes normal.  Psychiatric:        Behavior: Behavior normal.        Thought Content: Thought content normal.   walker    A total time of >45 minutes was spent preparing to see the patient, reviewing tests, x-rays, operative reports and outside records.  Also, obtaining history and performing comprehensive physical exam.  Additionally, counseling the patient regarding the above listed issues.   Finally, documenting clinical information in the health records, coordination of care, educating the patient.  She was in the office with her son and her daughter.  We had a conference in the respect to advance planning, advanced directives etc.  We discussed a potential recent stroke and its implication.   Lab Results  Component Value Date   WBC 12.7 (H) 02/04/2019   HGB 10.2 (L) 02/04/2019   HCT 30.9 (L) 02/04/2019   PLT 251 02/04/2019   GLUCOSE 92 04/24/2019   CHOL 175 11/27/2014   TRIG 103.0 11/27/2014   HDL 50.40 11/27/2014   LDLDIRECT 139.3 02/21/2008   LDLCALC 104 (H) 11/27/2014   ALT 18 01/29/2019   AST 18 01/29/2019   NA 141 04/24/2019   K 3.7 04/24/2019   CL 102 04/24/2019   CREATININE 0.66 04/24/2019   BUN 13 04/24/2019   CO2 31 04/24/2019   TSH 1.77 07/31/2017   HGBA1C 5.5 01/29/2019   MICROALBUR 0.4 06/25/2008    MM DIGITAL SCREENING BILATERAL  Result Date: 11/17/2019 CLINICAL DATA:  Screening. EXAM: DIGITAL SCREENING BILATERAL MAMMOGRAM WITH CAD COMPARISON:  Previous exam(s). ACR Breast Density Category b: There are  scattered areas of fibroglandular density. FINDINGS: There are no findings suspicious for malignancy. Images were processed with CAD. IMPRESSION: No mammographic evidence of malignancy. A result letter of this screening mammogram will be mailed directly to the patient. RECOMMENDATION: Screening mammogram in one year. (Code:SM-B-01Y) BI-RADS CATEGORY  1: Negative. Electronically Signed   By: Shanon Brow  Mee Hives M.D   On: 11/17/2019 08:20    Assessment & Plan:      Walker Kehr, MD

## 2020-05-04 NOTE — Assessment & Plan Note (Signed)
On B12 inj 

## 2020-05-04 NOTE — Assessment & Plan Note (Addendum)
Increase ASA to 162 mg/d Carotid doppler US Brain CT Neurol ref

## 2020-05-04 NOTE — Assessment & Plan Note (Signed)
Carotid doppler US Brain CT Neurol ref

## 2020-05-06 ENCOUNTER — Inpatient Hospital Stay: Admission: RE | Admit: 2020-05-06 | Payer: Medicare Other | Source: Ambulatory Visit

## 2020-05-06 NOTE — Telephone Encounter (Signed)
Called pt son there was no answer LMOM RTC.Marland KitchenJohny Chess

## 2020-05-08 ENCOUNTER — Ambulatory Visit: Payer: Medicare Other | Attending: Internal Medicine

## 2020-05-08 DIAGNOSIS — Z23 Encounter for immunization: Secondary | ICD-10-CM

## 2020-05-08 NOTE — Progress Notes (Signed)
   Covid-19 Vaccination Clinic  Name:  Kathleen Mejia    MRN: [id number] DOB: September 18, 1933  05/08/2020  Kathleen Mejia was observed post Covid-19 immunization for 15 minutes without incident. She was provided with Vaccine Information Sheet and instruction to access the V-Safe system.   Kathleen Mejia was instructed to call 911 with any severe reactions post vaccine: [name] Difficulty breathing  . Swelling of face and throat  . A fast heartbeat  . A bad rash all over body  . Dizziness and weakness   Immunizations Administered    Name Date Dose VIS Date Route   Pfizer COVID-19 Vaccine 05/08/2020  9:15 AM 0.3 mL 03/24/2020 Intramuscular   Manufacturer: [place]   Lot: [id number]   NDC: [address]-4

## 2020-05-12 ENCOUNTER — Other Ambulatory Visit: Payer: Self-pay

## 2020-05-12 ENCOUNTER — Ambulatory Visit (INDEPENDENT_AMBULATORY_CARE_PROVIDER_SITE_OTHER)
Admission: RE | Admit: 2020-05-12 | Discharge: 2020-05-12 | Disposition: A | Discharge status: Home / Self Care | Payer: Medicare Other | Source: Ambulatory Visit | Attending: Internal Medicine | Admitting: Internal Medicine

## 2020-05-12 DIAGNOSIS — R413 Other amnesia: Secondary | ICD-10-CM

## 2020-05-12 DIAGNOSIS — G459 Transient cerebral ischemic attack, unspecified: Secondary | ICD-10-CM | POA: Diagnosis not present

## 2020-05-13 ENCOUNTER — Telehealth: Payer: Self-pay

## 2020-05-13 NOTE — Telephone Encounter (Signed)
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 MRI could further evaluate if concern for acute.

## 2020-05-14 NOTE — Telephone Encounter (Signed)
I replied in the lab results section.  Thanks

## 2020-05-20 ENCOUNTER — Telehealth: Payer: Self-pay | Admitting: Internal Medicine

## 2020-05-20 NOTE — Telephone Encounter (Signed)
Text messages to call Neurology to make an appointment, but when he calls they say they don't have enough information to make the appointment  [name] also wants to know if his mother can travel to [place] this over Christmas  Please call [name] at [phone]

## 2020-05-21 ENCOUNTER — Ambulatory Visit (HOSPITAL_COMMUNITY)
Admission: RE | Admit: 2020-05-21 | Discharge: 2020-05-21 | Disposition: A | Discharge status: Home / Self Care | Payer: Medicare Other | Source: Ambulatory Visit | Attending: Cardiovascular Disease | Admitting: Cardiovascular Disease

## 2020-05-21 ENCOUNTER — Other Ambulatory Visit: Payer: Self-pay

## 2020-05-21 DIAGNOSIS — R413 Other amnesia: Secondary | ICD-10-CM

## 2020-05-21 DIAGNOSIS — G459 Transient cerebral ischemic attack, unspecified: Secondary | ICD-10-CM | POA: Diagnosis not present

## 2020-05-21 NOTE — Telephone Encounter (Signed)
1.  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 2.  She can travel to [place] over Christmas with the regular travel precautions. Thanks

## 2020-05-24 NOTE — Telephone Encounter (Signed)
Called pt son gve him MD response concerning traveling. Also inform him we will contact Neurology to see what all they are needing...lmb

## 2020-05-24 NOTE — Telephone Encounter (Signed)
Per Dr Tomi Likens: Referring provider needs to provide specific symptoms that suggest TIA. Need to know what symptoms pt was having beside memory loss.Marland KitchenJohny Mejia

## 2020-05-25 NOTE — Telephone Encounter (Signed)
She had an episode of apraxia and confusion, right parietal lobe infarction on the CT. Thanks

## 2020-05-26 NOTE — Telephone Encounter (Signed)
Copy and paste MD msg and sent to Moshe Salisbury.. see referral../lmb

## 2020-05-31 ENCOUNTER — Ambulatory Visit: Payer: Medicare Other

## 2020-06-01 ENCOUNTER — Encounter: Payer: Self-pay | Admitting: Neurology

## 2020-06-11 NOTE — Progress Notes (Signed)
NEUROLOGY CONSULTATION NOTE  Kathleen Mejia MRN: [id number] DOB: Nov 15, 1933  Referring provider: Lew Dawes, MD Primary care provider: Lew Dawes, MD  Reason for consult:  TIA   Subjective:  Kathleen Mejia is an 87 year old right-handed female with atrial fibrillation who presents for TIA.  PCP note reviewed.  On 04/30/2020, she had trouble reaching for objects with either hand.  She said she would reach her hand out but couldn't get close enough to grab it.  No headache, weakness, numbness or confusion.  Symptoms lasted about an hour.   She followed up with her PCP.  CT head without contrast on 05/13/2020 personally reviewed showed age-indeterminate/but probable remote, infarct in the right parietal lobe as well as chronic small vessel ischemic changes.  She was advised to increase her ASA from 81mg  to 325mg  daily.  Carotid duplex on 05/21/2020 showed minimal plaque with no hemodynamically significant stenosis of the bilateral ICAs.  She has felt fine since then.  She does not drive.  05/12/2020 CT HEAD WO:  1. Age indeterminate infarct in the right parietal lobe, favored remote given suggestion of mild cortical volume loss. An MRI could further evaluate if there is concern for acute infarct.  2. Chronic microvascular ischemic disease and generalized cerebral volume loss.  PAST MEDICAL HISTORY: Past Medical History:  Diagnosis Date  . Anxiety   . Atrial fibrillation ([place])   . Cystocele with prolapse   . Dislocated shoulder 2012   right  . Edema    left leg  . Fatty liver   . Gait disturbance   . Gallstone    1.5 cm  . Ganglion cyst   . Glaucoma   . HTN (hypertension)   . Hyperlipidemia   . LBP (low back pain)   . Obesity   . Osteoarthritis   . Rosacea   . Shortness of breath    with activities and exertion, pt denies 01/23/2019  . Venous insufficiency of leg   . Vertigo    not current 01/23/2019  . Vitamin B 12 deficiency     PAST SURGICAL  HISTORY: Past Surgical History:  Procedure Laterality Date  . ABDOMINAL HYSTERECTOMY    . CHOLECYSTECTOMY N/A 03/27/2014   Procedure: LAPAROSCOPIC CHOLECYSTECTOMY WITH INTRAOPERATIVE CHOLANGIOGRAM;  Surgeon: Fanny Skates, MD;  Location: [place];  Service: General;  Laterality: N/A;  . CYSTOCELE REPAIR N/A 01/28/2019   Procedure: ANTERIOR REPAIR (CYSTOCELE);  Surgeon: [name], MD;  Location[facility];  Service: Gynecology;  Laterality: N/A;  . CYSTOSCOPY N/A 01/28/2019   Procedure: CYSTOSCOPY;  Surgeon: [name], MD;  Location: [facility];  Service: Gynecology;  Laterality: N/A;  . TONSILLECTOMY    . VAGINAL HYSTERECTOMY Left 01/28/2019   Procedure: HYSTERECTOMY VAGINAL WITH LEFT SALPINGECTOMY;  Surgeon: [name], MD;  Location: [facility];  Service: Gynecology;  Laterality: Left;    MEDICATIONS: Current Outpatient Medications on File Prior to Visit  Medication Sig Dispense Refill  . Ascorbic Acid (VITAMIN C) 1000 MG tablet Take 1,000 mg by mouth daily.     [name] aspirin EC 81 MG tablet Take 2 tablets (162 mg total) by mouth daily. 100 tablet 3  . bumetanide (BUMEX) 0.5 MG tablet Take 1 tablet (0.5 mg total) by mouth daily with breakfast. 90 tablet 3  . Cod Liver Oil 1000 MG CAPS Take 1,000 mg by mouth daily.     . Coenzyme Q10 (CO Q 10 PO) Take 250  mg by mouth daily.     [name] CRANBERRY PO Take 1 capsule by mouth daily.     [name] docusate sodium (COLACE) 100 MG capsule Take 1 capsule (100 mg total) by mouth 2 (two) times daily. (Patient not taking: Reported on 05/04/2020) 60 capsule 0  . FLAXSEED, LINSEED, PO Take 0.5 Scoops by mouth daily. PATIENT SPRINKLE FLAX SEEDS ONTO HER CEREAL DAILY     . Garlic 562 MG CAPS Take 500 mg by mouth daily.     [name] ketoconazole (NIZORAL) 2 % cream Apply 1 application topically 2 (two) times daily. (Patient not taking: Reported on 05/04/2020) 45 g 1  . Multiple Minerals (MULTI-MINERALS  PO) Take 1 tablet by mouth daily.    . polyethylene glycol (MIRALAX / GLYCOLAX) 17 g packet Take 17 g by mouth daily as needed for mild constipation. 14 each 0  . potassium chloride (KLOR-CON) 10 MEQ tablet Take 1 tablet (10 mEq total) by mouth daily. 90 tablet 3  . Zinc Sulfate (ZINC 15 PO) Take 50 mg by mouth daily.      No current facility-administered medications on file prior to visit.    ALLERGIES: Allergies  Allergen Reactions  . Eliquis [Apixaban]     HAs, achy  . Losartan     "all side effects and an article from [employer] re Valsartan recall"  . Maxzide [Hydrochlorothiazide W-Triamterene]     Cramps w/a high dose  . Metoprolol     achy    FAMILY HISTORY: Family History  Problem Relation Age of Onset  . Pancreatic cancer Mother   . Hypertension Mother   . Breast cancer Mother   . Kidney failure Sister     SOCIAL HISTORY: Social History   Socioeconomic History  . Marital status: Widowed    Spouse name: Not on file  . Number of children: Not on file  . Years of education: Not on file  . Highest education level: Not on file  Occupational History  . Occupation: Retired  Tobacco Use  . Smoking status: Never Smoker  . Smokeless tobacco: Never Used  Vaping Use  . Vaping Use: Never used  Substance and Sexual Activity  . Alcohol use: No  . Drug use: Never  . Sexual activity: Not Currently    Birth control/protection: Post-menopausal  Other Topics Concern  . Not on file  Social History Narrative   Not taking vaccines         Social Determinants of Health   Financial Resource Strain: Not on file  Food Insecurity: Not on file  Transportation Needs: Not on file  Physical Activity: Not on file  Stress: Not on file  Social Connections: Not on file  Intimate Partner Violence: Not on file    Objective:  Blood pressure (!) 159/77, pulse 100, height 5\' 1"  (1.549 m), weight 174 lb 6.4 oz (79.1 kg), SpO2 98 %. General: No acute distress.  Patient appears  well-groomed.   Head:  Normocephalic/atraumatic Eyes:  fundi examined but not visualized Neck: supple, no paraspinal tenderness, full range of motion Back: No paraspinal tenderness Heart: regular rate and rhythm Lungs: Clear to auscultation bilaterally. Vascular: No carotid bruits. Neurological Exam: Mental status:  [employer] Mental Exam 06/14/2020  Weekday Correct 1  Current year 1  What state are we in? 1  Amount spent 1  Amount left 0  # of Animals 1  5 objects recall 3  Number series 1  Hour markers 0  Time correct 0  Placed  X in triangle correctly 1  Largest Figure 1  Name of female 2  Date back to work 2  Type of work [employer] she lived in 2  Total score 17  Left-sided neglect as demonstrated on clock drawing.   Cranial nerves: CN I: not tested CN II: pupils equal, round and reactive to light, visual fields intact CN III, IV, VI:  full range of motion, no nystagmus, no ptosis CN V: facial sensation intact. CN VII: upper and lower face symmetric CN VIII: hearing intact CN IX, X: gag intact, uvula midline CN XI: sternocleidomastoid and trapezius muscles intact CN XII: tongue midline Bulk & Tone: normal, no fasciculations. Motor:  muscle strength 5/5 throughout Sensation:  Light touch sensation intact. Deep Tendon Reflexes:  absent throughout.   Finger to nose testing:  Without dysmetria.   Gait:  Cautious.  Uses walker.    Assessment/Plan:   1.  Transient episode - possible TIA/CVA 2.  Right parietal infarct 3.  HTN  Unclear etiology of transient episode as it is fairly vague.  If it was related to the parietal stroke on CT, then it may represent apraxia.  However, the parietal stroke on CT may be incidental as it looked to more likely be chronic, but without follow up MRI, it is uncertain.  She does exhibit signs hemineglect on the clock drawing, which can be accounted for by the stroke, but this may be a chronic symptom as ultimately hemineglect  often goes unnoticed. I would treat for secondary stroke prevention.  1.  I would continue ASA 325mg  daily 2.  Will check lipid panel.  LDL goal should be less than 70 3.  Check complete 2D echocardiogram 4.  Blood pressure control 5.  Follow up in 6 months.    Thank you for allowing me to take part in the care of this patient.  Metta Clines, DO  CC: Lew Dawes, MD

## 2020-06-14 ENCOUNTER — Other Ambulatory Visit (INDEPENDENT_AMBULATORY_CARE_PROVIDER_SITE_OTHER): Payer: Medicare Other

## 2020-06-14 ENCOUNTER — Other Ambulatory Visit: Payer: Self-pay

## 2020-06-14 ENCOUNTER — Ambulatory Visit: Payer: Medicare Other | Admitting: Neurology

## 2020-06-14 ENCOUNTER — Encounter: Payer: Self-pay | Admitting: Neurology

## 2020-06-14 VITALS — BP 159/77 | HR 100 | Ht 61.0 in | Wt 174.4 lb

## 2020-06-14 DIAGNOSIS — I6389 Other cerebral infarction: Secondary | ICD-10-CM

## 2020-06-14 NOTE — Patient Instructions (Signed)
1.  Will check complete echocardiogram 2.  Will check lipid panel 3.  Take aspirin 325mg  daily 4.  Follow up in 6 months.

## 2020-06-15 LAB — LIPID PANEL
Cholesterol: 157 mg/dL (ref 0–200)
HDL: 45.9 mg/dL (ref >39.00)
LDL Cholesterol: 88 mg/dL (ref 0–99)
NonHDL: 111.46
Total CHOL/HDL Ratio: 3
Triglycerides: 118 mg/dL (ref 0.0–149.0)
VLDL: 23.6 mg/dL (ref 0.0–40.0)

## 2020-06-16 ENCOUNTER — Other Ambulatory Visit: Payer: Self-pay

## 2020-06-16 ENCOUNTER — Telehealth: Payer: Self-pay | Admitting: *Deleted

## 2020-06-16 DIAGNOSIS — I6389 Other cerebral infarction: Secondary | ICD-10-CM

## 2020-06-16 MED ORDER — SIMVASTATIN 5 MG PO TABS: 5.0000 mg | ORAL_TABLET | Freq: Every day | ORAL | 6 refills | Status: DC

## 2020-06-16 NOTE — Progress Notes (Signed)
Per dr.Jaffe pt to start Simavastin 5 mg and to recheck labs in 6 months a week before her visit 12/16/20

## 2020-06-16 NOTE — Telephone Encounter (Signed)
LMOM to call us back so we may give labwork test results.

## 2020-06-25 ENCOUNTER — Other Ambulatory Visit: Payer: Self-pay | Admitting: Internal Medicine

## 2020-06-26 ENCOUNTER — Other Ambulatory Visit: Payer: Self-pay | Admitting: Internal Medicine

## 2020-07-01 ENCOUNTER — Ambulatory Visit: Payer: Medicare Other | Admitting: Internal Medicine

## 2020-07-14 ENCOUNTER — Other Ambulatory Visit: Payer: Self-pay

## 2020-07-14 ENCOUNTER — Encounter: Payer: Self-pay | Admitting: Internal Medicine

## 2020-07-14 ENCOUNTER — Ambulatory Visit (INDEPENDENT_AMBULATORY_CARE_PROVIDER_SITE_OTHER): Payer: Medicare Other | Admitting: Internal Medicine

## 2020-07-14 VITALS — BP 140/84 | HR 86 | Temp 98.5°F | Ht 61.0 in | Wt 175.2 lb

## 2020-07-14 DIAGNOSIS — R609 Edema, unspecified: Secondary | ICD-10-CM

## 2020-07-14 DIAGNOSIS — E785 Hyperlipidemia, unspecified: Secondary | ICD-10-CM | POA: Diagnosis not present

## 2020-07-14 DIAGNOSIS — I639 Cerebral infarction, unspecified: Secondary | ICD-10-CM

## 2020-07-14 DIAGNOSIS — R413 Other amnesia: Secondary | ICD-10-CM | POA: Diagnosis not present

## 2020-07-14 DIAGNOSIS — E538 Deficiency of other specified B group vitamins: Secondary | ICD-10-CM | POA: Diagnosis not present

## 2020-07-14 DIAGNOSIS — R269 Unspecified abnormalities of gait and mobility: Secondary | ICD-10-CM

## 2020-07-14 DIAGNOSIS — R0609 Other forms of dyspnea: Secondary | ICD-10-CM

## 2020-07-14 DIAGNOSIS — G459 Transient cerebral ischemic attack, unspecified: Secondary | ICD-10-CM

## 2020-07-14 DIAGNOSIS — R0600 Dyspnea, unspecified: Secondary | ICD-10-CM

## 2020-07-14 DIAGNOSIS — Z8673 Personal history of transient ischemic attack (TIA), and cerebral infarction without residual deficits: Secondary | ICD-10-CM | POA: Insufficient documentation

## 2020-07-14 MED ORDER — DONEPEZIL HCL 5 MG PO TABS: 5.0000 mg | ORAL_TABLET | Freq: Every day | ORAL | 3 refills | Status: DC

## 2020-07-14 MED ORDER — CYANOCOBALAMIN 1000 MCG/ML IJ SOLN
1000.0000 ug | Freq: Once | INTRAMUSCULAR | Status: AC
  Administered 2020-07-14: 1000 ug via INTRAMUSCULAR

## 2020-07-14 NOTE — Assessment & Plan Note (Signed)
Due to deconditioning.  Hopefully home PT will help.

## 2020-07-14 NOTE — Assessment & Plan Note (Addendum)
Progressing memory loss, likely dementia of Alzheimer's type  She started to see Dr Tomi Likens.  She will follow up with him  Start Aricept 5 mg a day

## 2020-07-14 NOTE — Assessment & Plan Note (Signed)
Dr Tomi Likens Right parietal infarct 2021 Continue with simvastatin Take aspirin 162 mg a day  PT at home

## 2020-07-14 NOTE — Assessment & Plan Note (Signed)
Chronic.  Continue with Bumex

## 2020-07-14 NOTE — Assessment & Plan Note (Signed)
Worse.  We will arrange for home PT

## 2020-07-14 NOTE — Patient Instructions (Signed)
.  avpc

## 2020-07-14 NOTE — Assessment & Plan Note (Signed)
Continue with simvastatin Take aspirin 162 mg a day

## 2020-07-14 NOTE — Progress Notes (Signed)
Subjective:  Patient ID: Kathleen Mejia, female    DOB: [birth year]  Age: 87 y.o. MRN: [id number]  CC: Follow-up (2 MONTH F/U)   HPI [name] presents for memory loss that is progressing.  She has been suspicious about other people stealing her money, valuables.  Usually her son is able to find when she was missing.  She gets upset easily.  She lives at home by herself and gets scared sometimes of people outside etc.  Her son [name] is taking care of her.  He is here today she is getting weaker, unsteady on her feet. Was obtained from the patient and from her son.  Outpatient Medications Prior to Visit  Medication Sig Dispense Refill  . Ascorbic Acid (VITAMIN C) 1000 MG tablet Take 1,000 mg by mouth daily.     [name] aspirin EC 81 MG tablet Take 2 tablets (162 mg total) by mouth daily. 100 tablet 3  . bumetanide (BUMEX) 0.5 MG tablet TAKE 1 TABLET(0.5 MG) BY MOUTH DAILY WITH BREAKFAST 90 tablet 3  . Cod Liver Oil 1000 MG CAPS Take 1,000 mg by mouth daily.     . Coenzyme Q10 (CO Q 10 PO) Take 250 mg by mouth daily.     [name] CRANBERRY PO Take 1 capsule by mouth daily.     [name] docusate sodium (COLACE) 100 MG capsule Take 1 capsule (100 mg total) by mouth 2 (two) times daily. 60 capsule 0  . FLAXSEED, LINSEED, PO Take 0.5 Scoops by mouth daily. PATIENT SPRINKLE FLAX SEEDS ONTO HER CEREAL DAILY    . Garlic 209 MG CAPS Take 500 mg by mouth daily.     [name] ketoconazole (NIZORAL) 2 % cream Apply 1 application topically 2 (two) times daily. 45 g 1  . Multiple Minerals (MULTI-MINERALS PO) Take 1 tablet by mouth daily.    . polyethylene glycol (MIRALAX / GLYCOLAX) 17 g packet Take 17 g by mouth daily as needed for mild constipation. 14 each 0  . potassium chloride (KLOR-CON) 10 MEQ tablet TAKE 1 TABLET(10 MEQ) BY MOUTH DAILY 90 tablet 3  . simvastatin (ZOCOR) 5 MG tablet Take 1 tablet (5 mg total) by mouth daily. 30 tablet 6  . Zinc Sulfate (ZINC 15 PO) Take 50 mg by mouth daily.      No  facility-administered medications prior to visit.    ROS: Review of Systems  Constitutional: Negative for activity change, appetite change, chills, fatigue and unexpected weight change.  HENT: Negative for congestion, mouth sores and sinus pressure.   Eyes: Negative for visual disturbance.  Respiratory: Negative for cough and chest tightness.   Gastrointestinal: Negative for abdominal pain and nausea.  Genitourinary: Negative for difficulty urinating, frequency and vaginal pain.  Musculoskeletal: Positive for arthralgias and gait problem. Negative for back pain.  Skin: Negative for pallor and rash.  Neurological: Negative for dizziness, tremors, weakness, numbness and headaches.  Psychiatric/Behavioral: Positive for behavioral problems, confusion and decreased concentration. Negative for sleep disturbance and suicidal ideas. The patient is nervous/anxious.     Objective:  BP 140/84 (BP Location: Left Arm)   Pulse 86   Temp 98.5 F (36.9 C) (Oral)   Ht 5\' 1"  (1.549 m)   Wt 175 lb 3.2 oz (79.5 kg)   SpO2 97%   BMI 33.10 kg/m   BP Readings from Last 3 Encounters:  07/14/20 140/84  06/14/20 (!) 159/77  05/04/20 130/74    Wt Readings from Last 3 Encounters:  07/14/20 175 lb  3.2 oz (79.5 kg)  06/14/20 174 lb 6.4 oz (79.1 kg)  05/04/20 172 lb 9.6 oz (78.3 kg)    Physical Exam Constitutional:      General: She is not in acute distress.    Appearance: She is well-developed. She is obese.  HENT:     Head: Normocephalic.     Right Ear: External ear normal.     Left Ear: External ear normal.     Nose: Nose normal.     Mouth/Throat:     Mouth: Oropharynx is clear and moist.  Eyes:     General:        Right eye: No discharge.        Left eye: No discharge.     Conjunctiva/sclera: Conjunctivae normal.     Pupils: Pupils are equal, round, and reactive to light.  Neck:     Thyroid: No thyromegaly.     Vascular: No JVD.     Trachea: No tracheal deviation.  Cardiovascular:      Rate and Rhythm: Normal rate and regular rhythm.     Heart sounds: Normal heart sounds.  Pulmonary:     Effort: No respiratory distress.     Breath sounds: No stridor. No wheezing.  Abdominal:     General: Bowel sounds are normal. There is no distension.     Palpations: Abdomen is soft. There is no mass.     Tenderness: There is no abdominal tenderness. There is no guarding or rebound.  Musculoskeletal:        General: No tenderness or edema.     Cervical back: Normal range of motion and neck supple.  Lymphadenopathy:     Cervical: No cervical adenopathy.  Skin:    Findings: No erythema or rash.  Neurological:     Cranial Nerves: No cranial nerve deficit.     Motor: Weakness present. No abnormal muscle tone.     Coordination: Coordination abnormal.     Gait: Gait abnormal.     Deep Tendon Reflexes: Reflexes normal.  Psychiatric:        Mood and Affect: Mood and affect normal.        Behavior: Behavior normal.        Thought Content: Thought content normal.   She is well-groomed, alert and cooperative    A total time of >45 minutes was spent preparing to see the patient, reviewing tests, x-rays,  reports and outside records.  Also, obtaining history and performing comprehensive physical exam.  Additionally, counseling the patient regarding the above listed issues.   Finally, documenting clinical information in the health records, coordination of care, educating the patient.  Lab Results  Component Value Date   WBC 12.7 (H) 02/04/2019   HGB 10.2 (L) 02/04/2019   HCT 30.9 (L) 02/04/2019   PLT 251 02/04/2019   GLUCOSE 92 04/24/2019   CHOL 157 06/14/2020   TRIG 118.0 06/14/2020   HDL 45.90 06/14/2020   LDLDIRECT 139.3 02/21/2008   LDLCALC 88 06/14/2020   ALT 18 01/29/2019   AST 18 01/29/2019   NA 141 04/24/2019   K 3.7 04/24/2019   CL 102 04/24/2019   CREATININE 0.66 04/24/2019   BUN 13 04/24/2019   CO2 31 04/24/2019   TSH 1.77 07/31/2017   HGBA1C 5.5 01/29/2019    MICROALBUR 0.4 06/25/2008    VAS US CAROTID  Result Date: 05/21/2020 Carotid Arterial Duplex Study Indications:   TIA. Patient reports episode of loss of grip strength when she  went to pick some objects up. She could not get her hands to work                and was unable to grasp what she was intending to. Also some                concern for decreased concentration and memory loss. Risk Factors:  Hypertension, no history of smoking. 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 Performing Technologist: Mariane Masters RVT  Examination Guidelines: A complete evaluation includes B-mode imaging, spectral Doppler, color Doppler, and power Doppler as needed of all accessible portions of each vessel. Bilateral testing is considered an integral part of a complete examination. Limited examinations for reoccurring indications may be performed as noted.  Right Carotid Findings: +----------+--------+--------+--------+------------------+--------+           PSV cm/sEDV cm/sStenosisPlaque DescriptionComments +----------+--------+--------+--------+------------------+--------+ CCA Prox  47      7                                          +----------+--------+--------+--------+------------------+--------+ CCA Distal48      10                                         +----------+--------+--------+--------+------------------+--------+ ICA Prox  42      8       1-39%   smooth                     +----------+--------+--------+--------+------------------+--------+ ICA Mid   39      12                                tortuous +----------+--------+--------+--------+------------------+--------+ ICA Distal56      18                                tortuous +----------+--------+--------+--------+------------------+--------+ ECA       105     11                                          +----------+--------+--------+--------+------------------+--------+ +----------+--------+-------+----------------+-------------------+           PSV cm/sEDV cmsDescribe        Arm Pressure (mmHG) +----------+--------+-------+----------------+-------------------+ [id number]             Multiphasic, PPI951                 +----------+--------+-------+----------------+-------------------+ +---------+--------+--+--------+--+---------+ VertebralPSV cm/s71EDV cm/s19Antegrade +---------+--------+--+--------+--+---------+  Left Carotid Findings: +----------+--------+--------+--------+------------------+------------------+           PSV cm/sEDV cm/sStenosisPlaque DescriptionComments           +----------+--------+--------+--------+------------------+------------------+ CCA Prox  66      9                                                    +----------+--------+--------+--------+------------------+------------------+  CCA Distal51      8                                 intimal thickening +----------+--------+--------+--------+------------------+------------------+ ICA Prox  42      6               heterogenous                         +----------+--------+--------+--------+------------------+------------------+ ICA Mid   66      14      1-39%                     tortuous           +----------+--------+--------+--------+------------------+------------------+ ICA Distal82      18                                                   +----------+--------+--------+--------+------------------+------------------+ ECA       70      4                                                    +----------+--------+--------+--------+------------------+------------------+ +----------+--------+--------+----------------+-------------------+           PSV cm/sEDV cm/sDescribe        Arm Pressure (mmHG)  +----------+--------+--------+----------------+-------------------+ [id number]              Multiphasic, [id number]                 +----------+--------+--------+----------------+-------------------+ +---------+--------+--+--------+-+---------+ VertebralPSV cm/s43EDV cm/s6Antegrade +---------+--------+--+--------+-+---------+   Summary: Right Carotid: Velocities in the right ICA are consistent with a 1-39% stenosis.                Minimal plaque noted. Left Carotid: Velocities in the left ICA are consistent with a 1-39% stenosis.               Minimal plaque noted. Vertebrals:  Bilateral vertebral arteries demonstrate antegrade flow. Subclavians: Normal flow hemodynamics were seen in bilateral subclavian              arteries. *See table(s) above for measurements and observations.  Electronically signed by Ida Rogue MD on 05/21/2020 at 9:17:54 PM.    Final     Assessment & Plan:    Walker Kehr, MD

## 2020-07-14 NOTE — Assessment & Plan Note (Signed)
Continue with simvastatin 

## 2020-07-15 ENCOUNTER — Ambulatory Visit: Payer: Medicare Other

## 2020-07-15 DIAGNOSIS — I6389 Other cerebral infarction: Secondary | ICD-10-CM | POA: Diagnosis not present

## 2020-07-15 LAB — LIPID PANEL
Cholesterol: 143 mg/dL (ref 0–200)
HDL: 49.8 mg/dL (ref >39.00)
LDL Cholesterol: 76 mg/dL (ref 0–99)
NonHDL: 93.65
Total CHOL/HDL Ratio: 3
Triglycerides: 89 mg/dL (ref 0.0–149.0)
VLDL: 17.8 mg/dL (ref 0.0–40.0)

## 2020-07-15 LAB — COMPREHENSIVE METABOLIC PANEL
ALT: 13 U/L (ref 0–35)
AST: 17 U/L (ref 0–37)
Albumin: 4.1 g/dL (ref 3.5–5.2)
Alkaline Phosphatase: 87 U/L (ref 39–117)
BUN: 14 mg/dL (ref 6–23)
CO2: 33 mEq/L — ABNORMAL HIGH (ref 19–32)
Calcium: 9.8 mg/dL (ref 8.4–10.5)
Chloride: 102 mEq/L (ref 96–112)
Creatinine, Ser: 0.72 mg/dL (ref 0.40–1.20)
GFR: 76.43 mL/min (ref >60.00)
Glucose, Bld: 96 mg/dL (ref 70–99)
Potassium: 3.8 mEq/L (ref 3.5–5.1)
Sodium: 142 mEq/L (ref 135–145)
Total Bilirubin: 0.4 mg/dL (ref 0.2–1.2)
Total Protein: 7.3 g/dL (ref 6.0–8.3)

## 2020-07-27 ENCOUNTER — Telehealth: Payer: Self-pay | Admitting: Internal Medicine

## 2020-07-27 NOTE — Telephone Encounter (Signed)
Tillie Rung from [facility] called to inform the patient is scheduled for an evaluation on 2.28.2022.

## 2020-08-02 DIAGNOSIS — I4891 Unspecified atrial fibrillation: Secondary | ICD-10-CM | POA: Diagnosis not present

## 2020-08-02 DIAGNOSIS — I10 Essential (primary) hypertension: Secondary | ICD-10-CM | POA: Diagnosis not present

## 2020-08-02 DIAGNOSIS — E538 Deficiency of other specified B group vitamins: Secondary | ICD-10-CM | POA: Diagnosis not present

## 2020-08-02 DIAGNOSIS — I872 Venous insufficiency (chronic) (peripheral): Secondary | ICD-10-CM | POA: Diagnosis not present

## 2020-08-02 DIAGNOSIS — F419 Anxiety disorder, unspecified: Secondary | ICD-10-CM | POA: Diagnosis not present

## 2020-08-02 DIAGNOSIS — Z7982 Long term (current) use of aspirin: Secondary | ICD-10-CM | POA: Diagnosis not present

## 2020-08-02 DIAGNOSIS — R609 Edema, unspecified: Secondary | ICD-10-CM | POA: Diagnosis not present

## 2020-08-02 DIAGNOSIS — R413 Other amnesia: Secondary | ICD-10-CM | POA: Diagnosis not present

## 2020-08-02 DIAGNOSIS — I69398 Other sequelae of cerebral infarction: Secondary | ICD-10-CM | POA: Diagnosis not present

## 2020-08-02 DIAGNOSIS — E785 Hyperlipidemia, unspecified: Secondary | ICD-10-CM | POA: Diagnosis not present

## 2020-08-02 DIAGNOSIS — R269 Unspecified abnormalities of gait and mobility: Secondary | ICD-10-CM | POA: Diagnosis not present

## 2020-08-02 DIAGNOSIS — M2550 Pain in unspecified joint: Secondary | ICD-10-CM | POA: Diagnosis not present

## 2020-08-09 DIAGNOSIS — I872 Venous insufficiency (chronic) (peripheral): Secondary | ICD-10-CM

## 2020-08-09 DIAGNOSIS — I10 Essential (primary) hypertension: Secondary | ICD-10-CM

## 2020-08-09 DIAGNOSIS — F419 Anxiety disorder, unspecified: Secondary | ICD-10-CM

## 2020-08-09 DIAGNOSIS — E785 Hyperlipidemia, unspecified: Secondary | ICD-10-CM

## 2020-08-09 DIAGNOSIS — M2550 Pain in unspecified joint: Secondary | ICD-10-CM

## 2020-08-09 DIAGNOSIS — I4891 Unspecified atrial fibrillation: Secondary | ICD-10-CM

## 2020-08-09 DIAGNOSIS — R413 Other amnesia: Secondary | ICD-10-CM

## 2020-08-09 DIAGNOSIS — Z7982 Long term (current) use of aspirin: Secondary | ICD-10-CM

## 2020-08-09 DIAGNOSIS — I69398 Other sequelae of cerebral infarction: Secondary | ICD-10-CM

## 2020-08-09 DIAGNOSIS — E538 Deficiency of other specified B group vitamins: Secondary | ICD-10-CM

## 2020-08-09 DIAGNOSIS — R269 Unspecified abnormalities of gait and mobility: Secondary | ICD-10-CM

## 2020-08-09 DIAGNOSIS — R609 Edema, unspecified: Secondary | ICD-10-CM

## 2020-08-12 ENCOUNTER — Ambulatory Visit: Payer: Medicare Other

## 2020-08-18 ENCOUNTER — Telehealth: Payer: Self-pay | Admitting: Internal Medicine

## 2020-08-18 NOTE — Telephone Encounter (Signed)
Called Tillie Rung had to leave msg on vm ok verbal to extend nursing. MD is out of the office, but will forward msg for podiatrist referral../lmb

## 2020-08-18 NOTE — Telephone Encounter (Signed)
Kathleen [phone]

## 2020-08-18 NOTE — Telephone Encounter (Signed)
Kathleen Mejia from [facility] called requesting verbal orders to extend home health once a week for 3 weeks to help with bathing.  Also wanted to suggest that she get referred to a pediatrist. There are 2 spots on the patients foot that are callus-like causing pain on the foot, and she is walking on outside of her feet

## 2020-08-19 ENCOUNTER — Ambulatory Visit: Payer: Medicare Other

## 2020-08-19 NOTE — Telephone Encounter (Signed)
Thanks

## 2020-08-26 ENCOUNTER — Telehealth: Payer: Self-pay

## 2020-08-26 ENCOUNTER — Ambulatory Visit (INDEPENDENT_AMBULATORY_CARE_PROVIDER_SITE_OTHER): Payer: Medicare Other

## 2020-08-26 ENCOUNTER — Other Ambulatory Visit: Payer: Self-pay

## 2020-08-26 DIAGNOSIS — E538 Deficiency of other specified B group vitamins: Secondary | ICD-10-CM | POA: Diagnosis not present

## 2020-08-26 MED ORDER — CYANOCOBALAMIN 1000 MCG/ML IJ SOLN
1000.0000 ug | INTRAMUSCULAR | Status: AC
  Administered 2020-08-26 – 2021-02-04 (×6): 1000 ug via INTRAMUSCULAR

## 2020-08-26 NOTE — Progress Notes (Addendum)
Pt here for monthly B12 injection per Dr Alain Marion  B12 1044mcg given IM left deltoid and pt tolerated injection well.  Next B12 injection scheduled for 09/27/20.  Medical screening examination/treatment/procedure(s) were performed by non-physician practitioner and as supervising physician I was immediately available for consultation/collaboration.  I agree with above. Lew Dawes, MD

## 2020-08-26 NOTE — Telephone Encounter (Signed)
Verbal order requested to continue monthly b12 injections. Last Vitamin B12 lab 07/31/2017 Last OV: 07/14/20

## 2020-08-27 NOTE — Telephone Encounter (Signed)
Okay.  Thanks.

## 2020-08-31 DIAGNOSIS — M2042 Other hammer toe(s) (acquired), left foot: Secondary | ICD-10-CM | POA: Diagnosis not present

## 2020-08-31 DIAGNOSIS — M7752 Other enthesopathy of left foot: Secondary | ICD-10-CM | POA: Diagnosis not present

## 2020-08-31 DIAGNOSIS — L97921 Non-pressure chronic ulcer of unspecified part of left lower leg limited to breakdown of skin: Secondary | ICD-10-CM | POA: Diagnosis not present

## 2020-09-01 DIAGNOSIS — M2550 Pain in unspecified joint: Secondary | ICD-10-CM | POA: Diagnosis not present

## 2020-09-01 DIAGNOSIS — E538 Deficiency of other specified B group vitamins: Secondary | ICD-10-CM | POA: Diagnosis not present

## 2020-09-01 DIAGNOSIS — R413 Other amnesia: Secondary | ICD-10-CM | POA: Diagnosis not present

## 2020-09-01 DIAGNOSIS — I10 Essential (primary) hypertension: Secondary | ICD-10-CM | POA: Diagnosis not present

## 2020-09-01 DIAGNOSIS — I69398 Other sequelae of cerebral infarction: Secondary | ICD-10-CM | POA: Diagnosis not present

## 2020-09-01 DIAGNOSIS — Z7982 Long term (current) use of aspirin: Secondary | ICD-10-CM | POA: Diagnosis not present

## 2020-09-01 DIAGNOSIS — E785 Hyperlipidemia, unspecified: Secondary | ICD-10-CM | POA: Diagnosis not present

## 2020-09-01 DIAGNOSIS — I872 Venous insufficiency (chronic) (peripheral): Secondary | ICD-10-CM | POA: Diagnosis not present

## 2020-09-01 DIAGNOSIS — I4891 Unspecified atrial fibrillation: Secondary | ICD-10-CM | POA: Diagnosis not present

## 2020-09-01 DIAGNOSIS — R269 Unspecified abnormalities of gait and mobility: Secondary | ICD-10-CM | POA: Diagnosis not present

## 2020-09-01 DIAGNOSIS — F419 Anxiety disorder, unspecified: Secondary | ICD-10-CM | POA: Diagnosis not present

## 2020-09-01 DIAGNOSIS — R609 Edema, unspecified: Secondary | ICD-10-CM | POA: Diagnosis not present

## 2020-09-03 ENCOUNTER — Telehealth: Payer: Self-pay | Admitting: Internal Medicine

## 2020-09-03 DIAGNOSIS — D485 Neoplasm of uncertain behavior of skin: Secondary | ICD-10-CM

## 2020-09-03 NOTE — Telephone Encounter (Signed)
Patients daughter called and said the patient is needing a referral to a dermatologist for a growth on the nail bed of her right middle finger. They are needing it to be sent to Dr. Stark Jock.   Phone: [phone]

## 2020-09-06 DIAGNOSIS — D485 Neoplasm of uncertain behavior of skin: Secondary | ICD-10-CM | POA: Diagnosis not present

## 2020-09-06 NOTE — Telephone Encounter (Signed)
Ok Thx 

## 2020-09-07 DIAGNOSIS — M7752 Other enthesopathy of left foot: Secondary | ICD-10-CM | POA: Diagnosis not present

## 2020-09-07 DIAGNOSIS — L97921 Non-pressure chronic ulcer of unspecified part of left lower leg limited to breakdown of skin: Secondary | ICD-10-CM | POA: Diagnosis not present

## 2020-09-08 ENCOUNTER — Telehealth: Payer: Self-pay | Admitting: Internal Medicine

## 2020-09-08 NOTE — Telephone Encounter (Signed)
LVM for pt to rtn my call to schedule AWV with [facility]. Please schedule awv if pt calls the office.  

## 2020-09-24 ENCOUNTER — Other Ambulatory Visit: Payer: Self-pay

## 2020-09-27 ENCOUNTER — Other Ambulatory Visit: Payer: Self-pay

## 2020-09-27 ENCOUNTER — Ambulatory Visit (INDEPENDENT_AMBULATORY_CARE_PROVIDER_SITE_OTHER): Payer: Medicare Other

## 2020-09-27 DIAGNOSIS — E538 Deficiency of other specified B group vitamins: Secondary | ICD-10-CM

## 2020-09-27 NOTE — Progress Notes (Addendum)
Pt here for monthly B12 injection per Dr. Alain Marion  B12 1053mcg given left deltoid IM, and pt tolerated injection well.  Next B12 injection scheduled for 10/27/20.   Medical screening examination/treatment/procedure(s) were performed by non-physician practitioner and as supervising physician I was immediately available for consultation/collaboration.  I agree with above. Lew Dawes, MD

## 2020-10-06 ENCOUNTER — Other Ambulatory Visit: Payer: Self-pay | Admitting: Internal Medicine

## 2020-10-06 DIAGNOSIS — Z1231 Encounter for screening mammogram for malignant neoplasm of breast: Secondary | ICD-10-CM

## 2020-10-21 DIAGNOSIS — M71572 Other bursitis, not elsewhere classified, left ankle and foot: Secondary | ICD-10-CM | POA: Diagnosis not present

## 2020-10-21 DIAGNOSIS — M216X2 Other acquired deformities of left foot: Secondary | ICD-10-CM | POA: Diagnosis not present

## 2020-10-22 ENCOUNTER — Telehealth: Payer: Self-pay | Admitting: Internal Medicine

## 2020-10-22 NOTE — Telephone Encounter (Signed)
Insurance and [facility] do not cover [facility] services. It has to be arranged privately. Sorry Thx

## 2020-10-22 NOTE — Telephone Encounter (Signed)
Patients son calling, states they would like to get a referral for [facility] nursing for the patient to help with bathing and different things.

## 2020-10-26 NOTE — Telephone Encounter (Signed)
Called pt son there was no answer LMOM w/MD response.Marland KitchenJohny Chess

## 2020-10-27 ENCOUNTER — Other Ambulatory Visit: Payer: Self-pay

## 2020-10-27 ENCOUNTER — Ambulatory Visit (INDEPENDENT_AMBULATORY_CARE_PROVIDER_SITE_OTHER): Payer: Medicare Other

## 2020-10-27 DIAGNOSIS — E538 Deficiency of other specified B group vitamins: Secondary | ICD-10-CM | POA: Diagnosis not present

## 2020-10-27 NOTE — Progress Notes (Signed)
Pt here for monthly B12 injection per Dr Alain Marion.   B12 1022mcg given IM left deltoid and pt tolerated injection well.

## 2020-11-11 ENCOUNTER — Ambulatory Visit: Payer: Medicare Other

## 2020-11-11 ENCOUNTER — Ambulatory Visit: Payer: Medicare Other | Admitting: Internal Medicine

## 2020-11-18 NOTE — Progress Notes (Deleted)
87 y.o. G24P1002 Widowed White or Caucasian Not Hispanic or Latino female here for annual exam.      No LMP recorded. Patient has had a hysterectomy.          Sexually active: {yes no:314532}  The current method of family planning is {contraception:315051}.    Exercising: No.  {types:19826} Smoker:  no  Health Maintenance: Pap:  unsure History of abnormal Pap:  {YES NO:22349} MMG:  11-12-19 normal BMD:   2015 Osteopenia Colonoscopy: Never TDaP:  unsure Gardasil: N/A   reports that she has never smoked. She has never used smokeless tobacco. She reports that she does not drink alcohol and does not use drugs.  Past Medical History:  Diagnosis Date   Anxiety    Atrial fibrillation ([place])    Cystocele with prolapse    Dislocated shoulder 2012   right   Edema    left leg   Fatty liver    Gait disturbance    Gallstone    1.5 cm   Ganglion cyst    Glaucoma    HTN (hypertension)    Hyperlipidemia    LBP (low back pain)    Obesity    Osteoarthritis    Rosacea    Shortness of breath    with activities and exertion, pt denies 01/23/2019   Venous insufficiency of leg    Vertigo    not current 01/23/2019   Vitamin B 12 deficiency     Past Surgical History:  Procedure Laterality Date   ABDOMINAL HYSTERECTOMY     CHOLECYSTECTOMY N/A 03/27/2014   Procedure: LAPAROSCOPIC CHOLECYSTECTOMY WITH INTRAOPERATIVE CHOLANGIOGRAM;  Surgeon: Fanny Skates, MD;  Location: [place];  Service: General;  Laterality: N/A;   CYSTOCELE REPAIR N/A 01/28/2019   Procedure: ANTERIOR REPAIR (CYSTOCELE);  Surgeon: [name], MD;  Location: [facility];  Service: Gynecology;  Laterality: N/A;   CYSTOSCOPY N/A 01/28/2019   Procedure: CYSTOSCOPY;  Surgeon: [name], MD;  Location: [facility];  Service: Gynecology;  Laterality: N/A;   TONSILLECTOMY     VAGINAL HYSTERECTOMY Left 01/28/2019   Procedure: HYSTERECTOMY VAGINAL WITH LEFT SALPINGECTOMY;  Surgeon:  [name], MD;  Location: [facility];  Service: Gynecology;  Laterality: Left;    Current Outpatient Medications  Medication Sig Dispense Refill   Ascorbic Acid (VITAMIN C) 1000 MG tablet Take 1,000 mg by mouth daily.      aspirin EC 81 MG tablet Take 2 tablets (162 mg total) by mouth daily. 100 tablet 3   bumetanide (BUMEX) 0.5 MG tablet TAKE 1 TABLET(0.5 MG) BY MOUTH DAILY WITH BREAKFAST 90 tablet 3   Cod Liver Oil 1000 MG CAPS Take 1,000 mg by mouth daily.      Coenzyme Q10 (CO Q 10 PO) Take 250 mg by mouth daily.      CRANBERRY PO Take 1 capsule by mouth daily.      docusate sodium (COLACE) 100 MG capsule Take 1 capsule (100 mg total) by mouth 2 (two) times daily. 60 capsule 0   donepezil (ARICEPT) 5 MG tablet Take 1 tablet (5 mg total) by mouth at bedtime. 90 tablet 3   FLAXSEED, LINSEED, PO Take 0.5 Scoops by mouth daily. PATIENT SPRINKLE FLAX SEEDS ONTO HER CEREAL DAILY     Garlic 834 MG CAPS Take 500 mg by mouth daily.      Multiple Minerals (MULTI-MINERALS PO) Take 1 tablet by mouth daily.     polyethylene glycol (MIRALAX /  GLYCOLAX) 17 g packet Take 17 g by mouth daily as needed for mild constipation. 14 each 0   potassium chloride (KLOR-CON) 10 MEQ tablet TAKE 1 TABLET(10 MEQ) BY MOUTH DAILY 90 tablet 3   simvastatin (ZOCOR) 5 MG tablet Take 1 tablet (5 mg total) by mouth daily. 30 tablet 6   Zinc Sulfate (ZINC 15 PO) Take 50 mg by mouth daily.      Current Facility-Administered Medications  Medication Dose Route Frequency Provider Last Rate Last Admin   cyanocobalamin ((VITAMIN B-12)) injection 1,000 mcg  1,000 mcg Intramuscular Q30 days Plotnikov, Evie Lacks, MD   1,000 mcg at 10/27/20 1522    Family History  Problem Relation Age of Onset   Pancreatic cancer Mother    Hypertension Mother    Breast cancer Mother    Kidney failure Sister     Review of Systems  Exam:   There were no vitals taken for this visit.  Weight change: @WEIGHTCHANGE @  Height:      Ht Readings from Last 3 Encounters:  07/14/20 5\' 1"  (1.549 m)  06/14/20 5\' 1"  (1.549 m)  05/04/20 5' (1.524 m)    General appearance: alert, cooperative and appears stated age Head: Normocephalic, without obvious abnormality, atraumatic Neck: no adenopathy, supple, symmetrical, trachea midline and thyroid {CHL AMB PHY EX THYROID NORM DEFAULT:[phone]} Lungs: clear to auscultation bilaterally Cardiovascular: regular rate and rhythm Breasts: {Exam; breast:13139} Abdomen: soft, non-tender; non distended,  no masses,  no organomegaly Extremities: extremities normal, atraumatic, no cyanosis or edema Skin: Skin color, texture, turgor normal. No rashes or lesions Lymph nodes: Cervical, supraclavicular, and axillary nodes normal. No abnormal inguinal nodes palpated Neurologic: Grossly normal   Pelvic: External genitalia:  no lesions              Urethra:  normal appearing urethra with no masses, tenderness or lesions              Bartholins and Skenes: normal                 Vagina: normal appearing vagina with normal color and discharge, no lesions              Cervix: {CHL AMB PHY EX CERVIX NORM DEFAULT:[phone]}               Bimanual Exam:  Uterus:  {CHL AMB PHY EX UTERUS NORM DEFAULT:[phone]}              Adnexa: {CHL AMB PHY EX ADNEXA NO MASS [phone]}               Rectovaginal: Confirms               Anus:  normal sphincter tone, no lesions chaperoned for the exam.  A:  Well Woman with normal exam  P:

## 2020-11-24 ENCOUNTER — Ambulatory Visit: Payer: Self-pay | Admitting: Obstetrics and Gynecology

## 2020-11-25 ENCOUNTER — Ambulatory Visit: Payer: Medicare Other

## 2020-11-25 ENCOUNTER — Ambulatory Visit: Payer: Medicare Other | Admitting: Internal Medicine

## 2020-11-26 ENCOUNTER — Ambulatory Visit: Payer: Medicare Other

## 2020-12-01 ENCOUNTER — Ambulatory Visit (INDEPENDENT_AMBULATORY_CARE_PROVIDER_SITE_OTHER): Payer: Medicare Other

## 2020-12-01 ENCOUNTER — Other Ambulatory Visit: Payer: Self-pay

## 2020-12-01 DIAGNOSIS — E538 Deficiency of other specified B group vitamins: Secondary | ICD-10-CM

## 2020-12-01 NOTE — Progress Notes (Addendum)
Pt here for monthly B12 injection per Dr Plotnikov.  B12 1000mcg given IM left deltoid and pt tolerated injection well.  Next B12 injection scheduled for 12/31/20.  Medical screening examination/treatment/procedure(s) were performed by non-physician practitioner and as supervising physician I was immediately available for consultation/collaboration.  I agree with above. Aleksei Plotnikov, MD  

## 2020-12-16 ENCOUNTER — Ambulatory Visit: Payer: Medicare Other | Admitting: Neurology

## 2020-12-21 ENCOUNTER — Ambulatory Visit: Payer: Medicare Other

## 2020-12-21 ENCOUNTER — Ambulatory Visit: Payer: Medicare Other | Admitting: Internal Medicine

## 2020-12-21 ENCOUNTER — Telehealth: Payer: Self-pay | Admitting: Internal Medicine

## 2020-12-21 NOTE — Telephone Encounter (Signed)
Left message asking patient to call office [facility] will be out of office please r/s awv 12/21/20

## 2020-12-27 ENCOUNTER — Encounter: Payer: Self-pay | Admitting: Obstetrics and Gynecology

## 2020-12-27 ENCOUNTER — Other Ambulatory Visit: Payer: Self-pay

## 2020-12-27 ENCOUNTER — Ambulatory Visit (INDEPENDENT_AMBULATORY_CARE_PROVIDER_SITE_OTHER): Payer: Medicare Other | Admitting: Obstetrics and Gynecology

## 2020-12-27 VITALS — BP 126/82 | HR 68 | Wt 160.0 lb

## 2020-12-27 DIAGNOSIS — N3941 Urge incontinence: Secondary | ICD-10-CM

## 2020-12-27 DIAGNOSIS — Z9189 Other specified personal risk factors, not elsewhere classified: Secondary | ICD-10-CM | POA: Diagnosis not present

## 2020-12-27 DIAGNOSIS — C541 Malignant neoplasm of endometrium: Secondary | ICD-10-CM

## 2020-12-27 DIAGNOSIS — Z90710 Acquired absence of both cervix and uterus: Secondary | ICD-10-CM

## 2020-12-27 DIAGNOSIS — Z8542 Personal history of malignant neoplasm of other parts of uterus: Secondary | ICD-10-CM

## 2020-12-27 NOTE — Patient Instructions (Signed)
Please f/u with Dr Alycia Rossetti in GYN Oncology in 1/23  EXERCISE   We recommended that you start or continue a regular exercise program for good health. Physical activity is anything that gets your body moving, some is better than none. The CDC recommends 150 minutes per week of Moderate-Intensity Aerobic Activity and 2 or more days of Muscle Strengthening Activity.  Benefits of exercise are limitless: helps weight loss/weight maintenance, improves mood and energy, helps with depression and anxiety, improves sleep, tones and strengthens muscles, improves balance, improves bone density, protects from chronic conditions such as heart disease, high blood pressure and diabetes and so much more. To learn more visit: WhyNotPoker.uy  DIET: Good nutrition starts with a healthy diet of fruits, vegetables, whole grains, and lean protein sources. Drink plenty of water for hydration. Minimize empty calories, sodium, sweets. For more information about dietary recommendations visit: GeekRegister.com.ee and [email or web address]-mitchell.com/  ALCOHOL:  Women should limit their alcohol intake to no more than 7 drinks/beers/glasses of wine (combined, not each!) per week. Moderation of alcohol intake to this level decreases your risk of breast cancer and liver damage.  If you are concerned that you may have a problem, or your friends have told you they are concerned about your drinking, there are many resources to help. A well-known program that is free, effective, and available to all people all over the nation is Alcoholics Anonymous.  Check out this site to learn more: BlockTaxes.se   CALCIUM AND VITAMIN D:  Adequate intake of calcium and Vitamin D are recommended for bone health.  You should be getting between 1000-1200 mg of calcium and 800 units of Vitamin D daily between diet and supplements  PAP SMEARS:  Pap smears, to check for  cervical cancer or precancers,  have traditionally been done yearly, scientific advances have shown that most women can have pap smears less often.  However, every woman still should have a physical exam from her gynecologist every year. It will include a breast check, inspection of the vulva and vagina to check for abnormal growths or skin changes, a visual exam of the cervix, and then an exam to evaluate the size and shape of the uterus and ovaries. We will also provide age appropriate advice regarding health maintenance, like when you should have certain vaccines, screening for sexually transmitted diseases, bone density testing, colonoscopy, mammograms, etc.   MAMMOGRAMS:  All women over 28 years old should have a routine mammogram.   COLON CANCER SCREENING: Now recommend starting at age 46. At this time colonoscopy is not covered for routine screening until 50. There are take home tests that can be done between 45-49.   COLONOSCOPY:  Colonoscopy to screen for colon cancer is recommended for all women at age 66.  We know, you hate the idea of the prep.  We agree, BUT, having colon cancer and not knowing it is worse!!  Colon cancer so often starts as a polyp that can be seen and removed at colonscopy, which can quite literally save your life!  And if your first colonoscopy is normal and you have no family history of colon cancer, most women don't have to have it again for 10 years.  Once every ten years, you can do something that may end up saving your life, right?  We will be happy to help you get it scheduled when you are ready.  Be sure to check your insurance coverage so you understand how much it will cost.  It may be covered  as a preventative service at no cost, but you should check your particular policy.      Breast Self-Awareness Breast self-awareness means being familiar with how your breasts look and feel. It involves checking your breasts regularly and reporting any changes to your health  care provider. Practicing breast self-awareness is important. A change in your breasts can be a sign of a serious medical problem. Being familiar with how your breasts look and feel allows you to find any problems early, when treatment is more likely to be successful. All women should practice breast self-awareness, including women who have had breast implants. How to do a breast self-exam One way to learn what is normal for your breasts and whether your breasts are changing is to do a breast self-exam. To do a breast self-exam: Look for Changes  Remove all the clothing above your waist. Stand in front of a mirror in a room with good lighting. Put your hands on your hips. Push your hands firmly downward. Compare your breasts in the mirror. Look for differences between them (asymmetry), such as: Differences in shape. Differences in size. Puckers, dips, and bumps in one breast and not the other. Look at each breast for changes in your skin, such as: Redness. Scaly areas. Look for changes in your nipples, such as: Discharge. Bleeding. Dimpling. Redness. A change in position. Feel for Changes Carefully feel your breasts for lumps and changes. It is best to do this while lying on your back on the floor and again while sitting or standing in the shower or tub with soapy water on your skin. Feel each breast in the following way: Place the arm on the side of the breast you are examining above your head. Feel your breast with the other hand. Start in the nipple area and make  inch (2 cm) overlapping circles to feel your breast. Use the pads of your three middle fingers to do this. Apply light pressure, then medium pressure, then firm pressure. The light pressure will allow you to feel the tissue closest to the skin. The medium pressure will allow you to feel the tissue that is a little deeper. The firm pressure will allow you to feel the tissue close to the ribs. Continue the overlapping circles,  moving downward over the breast until you feel your ribs below your breast. Move one finger-width toward the center of the body. Continue to use the  inch (2 cm) overlapping circles to feel your breast as you move slowly up toward your collarbone. Continue the up and down exam using all three pressures until you reach your armpit.  Write Down What You Find  Write down what is normal for each breast and any changes that you find. Keep a written record with breast changes or normal findings for each breast. By writing this information down, you do not need to depend only on memory for size, tenderness, or location. Write down where you are in your menstrual cycle, if you are still menstruating. If you are having trouble noticing differences in your breasts, do not get discouraged. With time you will become more familiar with the variations in your breasts and more comfortable with the exam. How often should I examine my breasts? Examine your breasts every month. If you are breastfeeding, the best time to examine your breasts is after a feeding or after using a breast pump. If you menstruate, the best time to examine your breasts is 5-7 days after your period is over. During  your period, your breasts are lumpier, and it may be more difficult to notice changes. When should I see my health care provider? See your health care provider if you notice: A change in shape or size of your breasts or nipples. A change in the skin of your breast or nipples, such as a reddened or scaly area. Unusual discharge from your nipples. A lump or thick area that was not there before. Pain in your breasts. Anything that concerns you.

## 2020-12-27 NOTE — Progress Notes (Signed)
87 y.o. G67P1002 Widowed White or Caucasian Not Hispanic or Latino female here for annual exam.   She is s/p TVH/LS/cystocele repair in 8/20.  Pathology with stage 1a, grade 1 endometrial adenocarcinoma. She was seen by Dr Alycia Rossetti in [place] who didn't feel she needed more surgery   No vaginal bleeding, no prolapse. Some urge incontinence, no change. She leaks daily, small amounts. Wears a pad. Declines medication.  No LMP recorded. Patient has had a hysterectomy.          Sexually active: No.  The current method of family planning is post menopausal status.    Exercising: No.  The patient does not participate in regular exercise at present. Smoker:  no  Health Maintenance: Pap:  patient had total hysterectomy  History of abnormal Pap:  no MMG:  11/27/19 Bi-rads 1 neg  BMD:   06/25/13 mild bone loss  Colonoscopy: unsure  TDaP:  patient refused  Gardasil: NA   reports that she has never smoked. She has never used smokeless tobacco. She reports that she does not drink alcohol and does not use drugs.  Past Medical History:  Diagnosis Date   Anxiety    Atrial fibrillation ([place])    Cystocele with prolapse    Dislocated shoulder 2012   right   Edema    left leg   Fatty liver    Gait disturbance    Gallstone    1.5 cm   Ganglion cyst    Glaucoma    HTN (hypertension)    Hyperlipidemia    LBP (low back pain)    Obesity    Osteoarthritis    Rosacea    Shortness of breath    with activities and exertion, pt denies 01/23/2019   Venous insufficiency of leg    Vertigo    not current 01/23/2019   Vitamin B 12 deficiency     Past Surgical History:  Procedure Laterality Date   ABDOMINAL HYSTERECTOMY     CHOLECYSTECTOMY N/A 03/27/2014   Procedure: LAPAROSCOPIC CHOLECYSTECTOMY WITH INTRAOPERATIVE CHOLANGIOGRAM;  Surgeon: Fanny Skates, MD;  Location: [facility];  Service: General;  Laterality: N/A;   CYSTOCELE REPAIR N/A 01/28/2019   Procedure: ANTERIOR REPAIR (CYSTOCELE);  Surgeon:  [name], MD;  Location: [facility];  Service: Gynecology;  Laterality: N/A;   CYSTOSCOPY N/A 01/28/2019   Procedure: CYSTOSCOPY;  Surgeon: [name], MD;  Location: [facility];  Service: Gynecology;  Laterality: N/A;   TONSILLECTOMY     VAGINAL HYSTERECTOMY Left 01/28/2019   Procedure: HYSTERECTOMY VAGINAL WITH LEFT SALPINGECTOMY;  Surgeon: [name], MD;  Location: [facility];  Service: Gynecology;  Laterality: Left;    Current Outpatient Medications  Medication Sig Dispense Refill   Ascorbic Acid (VITAMIN C) 1000 MG tablet Take 1,000 mg by mouth daily.      aspirin EC 81 MG tablet Take 2 tablets (162 mg total) by mouth daily. 100 tablet 3   bumetanide (BUMEX) 0.5 MG tablet TAKE 1 TABLET(0.5 MG) BY MOUTH DAILY WITH BREAKFAST 90 tablet 3   Cod Liver Oil 1000 MG CAPS Take 1,000 mg by mouth daily.      Coenzyme Q10 (CO Q 10 PO) Take 250 mg by mouth daily.      CRANBERRY PO Take 1 capsule by mouth daily.      docusate sodium (COLACE) 100 MG capsule Take 1 capsule (100 mg total) by mouth 2 (two) times daily. 60 capsule 0  donepezil (ARICEPT) 5 MG tablet Take 1 tablet (5 mg total) by mouth at bedtime. 90 tablet 3   FLAXSEED, LINSEED, PO Take 0.5 Scoops by mouth daily. PATIENT SPRINKLE FLAX SEEDS ONTO HER CEREAL DAILY     Garlic 323 MG CAPS Take 500 mg by mouth daily.      Multiple Minerals (MULTI-MINERALS PO) Take 1 tablet by mouth daily.     polyethylene glycol (MIRALAX / GLYCOLAX) 17 g packet Take 17 g by mouth daily as needed for mild constipation. 14 each 0   potassium chloride (KLOR-CON) 10 MEQ tablet TAKE 1 TABLET(10 MEQ) BY MOUTH DAILY 90 tablet 3   simvastatin (ZOCOR) 5 MG tablet Take 1 tablet (5 mg total) by mouth daily. 30 tablet 6   Zinc Sulfate (ZINC 15 PO) Take 50 mg by mouth daily.      Current Facility-Administered Medications  Medication Dose Route Frequency Provider Last Rate Last Admin    cyanocobalamin ((VITAMIN B-12)) injection 1,000 mcg  1,000 mcg Intramuscular Q30 days Plotnikov, Evie Lacks, MD   1,000 mcg at 12/01/20 1542    Family History  Problem Relation Age of Onset   Pancreatic cancer Mother    Hypertension Mother    Breast cancer Mother    Kidney failure Sister     Review of Systems  All other systems reviewed and are negative.  Exam:   BP 126/82   Pulse 68   Wt 160 lb (72.6 kg)   SpO2 98%   BMI 30.23 kg/m   Weight change: @WEIGHTCHANGE @ Height:      Ht Readings from Last 3 Encounters:  07/14/20 5\' 1"  (1.549 m)  06/14/20 5\' 1"  (1.549 m)  05/04/20 5' (1.524 m)    General appearance: alert, cooperative and appears stated age Head: Normocephalic, without obvious abnormality, atraumatic Breasts: normal appearance, no masses or tenderness Abdomen: soft, non-tender; non distended,  no masses,  no organomegaly Extremities: extremities normal, atraumatic, no cyanosis or edema Skin: Skin color, texture, turgor normal. No rashes or lesions Lymph nodes: Cervical, supraclavicular, and axillary nodes normal. No abnormal inguinal nodes palpated Neurologic: Grossly normal   Pelvic: External genitalia:  no lesions              Urethra:  normal appearing urethra with no masses, tenderness or lesions              Bartholins and Skenes: normal                 Vagina: normal appearing vagina with normal color and discharge, no lesions. No prolapse              Cervix: absent               Bimanual Exam:  Uterus:  uterus absent              Adnexa: no mass, fullness, tenderness               Rectovaginal: Confirms               Anus:  normal sphincter tone, no lesions  Gae Dry chaperoned for the exam.  1. GYN exam for high-risk Medicare patient Normal exam Mammogram scheduled  2. Endometrial cancer [facility]) S/p hysterectomy, normal exam F/U with GYN oncology in 6 months  3. H/O total vaginal hysterectomy No recurrent prolapse  4. Urge  incontinence Stable, declines medication

## 2020-12-31 ENCOUNTER — Ambulatory Visit (INDEPENDENT_AMBULATORY_CARE_PROVIDER_SITE_OTHER): Payer: Medicare Other

## 2020-12-31 ENCOUNTER — Other Ambulatory Visit: Payer: Self-pay

## 2020-12-31 DIAGNOSIS — E538 Deficiency of other specified B group vitamins: Secondary | ICD-10-CM | POA: Diagnosis not present

## 2020-12-31 NOTE — Progress Notes (Signed)
B12 given Please cosign 

## 2021-01-05 ENCOUNTER — Ambulatory Visit: Payer: Medicare Other | Admitting: Internal Medicine

## 2021-01-05 ENCOUNTER — Ambulatory Visit: Payer: Medicare Other

## 2021-01-19 ENCOUNTER — Other Ambulatory Visit: Payer: Self-pay | Admitting: Neurology

## 2021-01-19 DIAGNOSIS — I6389 Other cerebral infarction: Secondary | ICD-10-CM

## 2021-01-21 ENCOUNTER — Ambulatory Visit
Admission: RE | Admit: 2021-01-21 | Discharge: 2021-01-21 | Disposition: A | Discharge status: Home / Self Care | Payer: Medicare Other | Source: Ambulatory Visit | Attending: Internal Medicine | Admitting: Internal Medicine

## 2021-01-21 ENCOUNTER — Other Ambulatory Visit: Payer: Self-pay

## 2021-01-21 DIAGNOSIS — Z1231 Encounter for screening mammogram for malignant neoplasm of breast: Secondary | ICD-10-CM

## 2021-02-02 ENCOUNTER — Ambulatory Visit: Payer: Medicare Other

## 2021-02-04 ENCOUNTER — Ambulatory Visit (INDEPENDENT_AMBULATORY_CARE_PROVIDER_SITE_OTHER): Payer: Medicare Other

## 2021-02-04 ENCOUNTER — Other Ambulatory Visit: Payer: Self-pay

## 2021-02-04 DIAGNOSIS — E538 Deficiency of other specified B group vitamins: Secondary | ICD-10-CM

## 2021-02-04 NOTE — Progress Notes (Signed)
Pt came into the office to receive her Vitamin B12 shot. She tolerated the injection well. No questions or concerns at this time.

## 2021-02-17 ENCOUNTER — Other Ambulatory Visit: Payer: Self-pay

## 2021-02-17 ENCOUNTER — Encounter: Payer: Self-pay | Admitting: Internal Medicine

## 2021-02-17 ENCOUNTER — Ambulatory Visit (INDEPENDENT_AMBULATORY_CARE_PROVIDER_SITE_OTHER): Payer: Medicare Other

## 2021-02-17 ENCOUNTER — Ambulatory Visit (INDEPENDENT_AMBULATORY_CARE_PROVIDER_SITE_OTHER): Payer: Medicare Other | Admitting: Internal Medicine

## 2021-02-17 VITALS — BP 150/80 | HR 77 | Temp 98.4°F | Ht 61.0 in | Wt 170.0 lb

## 2021-02-17 VITALS — BP 150/80 | HR 77 | Temp 98.4°F | Ht 61.0 in | Wt 170.4 lb

## 2021-02-17 DIAGNOSIS — I10 Essential (primary) hypertension: Secondary | ICD-10-CM

## 2021-02-17 DIAGNOSIS — R413 Other amnesia: Secondary | ICD-10-CM | POA: Diagnosis not present

## 2021-02-17 DIAGNOSIS — E538 Deficiency of other specified B group vitamins: Secondary | ICD-10-CM | POA: Diagnosis not present

## 2021-02-17 DIAGNOSIS — E785 Hyperlipidemia, unspecified: Secondary | ICD-10-CM | POA: Diagnosis not present

## 2021-02-17 DIAGNOSIS — R7309 Other abnormal glucose: Secondary | ICD-10-CM | POA: Diagnosis not present

## 2021-02-17 DIAGNOSIS — Z0000 Encounter for general adult medical examination without abnormal findings: Secondary | ICD-10-CM

## 2021-02-17 DIAGNOSIS — Z8673 Personal history of transient ischemic attack (TIA), and cerebral infarction without residual deficits: Secondary | ICD-10-CM | POA: Diagnosis not present

## 2021-02-17 DIAGNOSIS — R609 Edema, unspecified: Secondary | ICD-10-CM

## 2021-02-17 MED ORDER — BUMETANIDE 0.5 MG PO TABS
ORAL_TABLET | ORAL | 3 refills | Status: DC
Start: 2021-02-17 — End: 2021-07-29

## 2021-02-17 MED ORDER — DONEPEZIL HCL 5 MG PO TABS: 5.0000 mg | ORAL_TABLET | Freq: Every day | ORAL | 3 refills | Status: DC

## 2021-02-17 MED ORDER — POTASSIUM CHLORIDE ER 10 MEQ PO TBCR: EXTENDED_RELEASE_TABLET | ORAL | 3 refills | Status: DC

## 2021-02-17 NOTE — Assessment & Plan Note (Signed)
On Vit B12 

## 2021-02-17 NOTE — Assessment & Plan Note (Signed)
On simvastatin.   

## 2021-02-17 NOTE — Assessment & Plan Note (Addendum)
S/p Right parietal infarct 2021.  Alzheimer's dementia Continue with Aricept Take aspirin 162 mg a day

## 2021-02-17 NOTE — Assessment & Plan Note (Signed)
S/p Right parietal infarct 2021 Continue with simvastatin Take aspirin 162 mg a day

## 2021-02-17 NOTE — Patient Instructions (Signed)
Kathleen Mejia , Thank you for taking time to come for your Medicare Wellness Visit. I appreciate your ongoing commitment to your health goals. Please review the following plan we discussed and let me know if I can assist you in the future.   Screening recommendations/referrals: Colonoscopy: Not a candidate for screening due to age 87: last done 01/21/2021; due every 1-2 years Bone Density: last done 06/25/2013; continue with current regimen Recommended yearly ophthalmology/optometry visit for glaucoma screening and checkup Recommended yearly dental visit for hygiene and checkup  Vaccinations: Influenza vaccine: declined Pneumococcal vaccine: declined Tdap vaccine: declined Shingles vaccine: declined   Covid-19:08/10/2019, 09/09/2019, 05/08/2020  Advanced directives: Please bring a copy of your health care power of attorney and living will to the office at your convenience.  Conditions/risks identified: Yes; Client understands the importance of follow-up with providers by attending scheduled visits and discussed goals to eat healthier, increase physical activity, exercise the brain, socialize more, get enough sleep and make time for laughter.  Next appointment: Please schedule your next Medicare Wellness Visit with your Nurse Health Advisor in 1 year by calling [phone].  Preventive Care 16 Years and Older, Female Preventive care refers to lifestyle choices and visits with your health care provider that can promote health and wellness. What does preventive care include? A yearly physical exam. This is also called an annual well check. Dental exams once or twice a year. Routine eye exams. Ask your health care provider how often you should have your eyes checked. Personal lifestyle choices, including: Daily care of your teeth and gums. Regular physical activity. Eating a healthy diet. Avoiding tobacco and drug use. Limiting alcohol use. Practicing safe sex. Taking low-dose aspirin  every day. Taking vitamin and mineral supplements as recommended by your health care provider. What happens during an annual well check? The services and screenings done by your health care provider during your annual well check will depend on your age, overall health, lifestyle risk factors, and family history of disease. Counseling  Your health care provider may ask you questions about your: Alcohol use. Tobacco use. Drug use. Emotional well-being. Home and relationship well-being. Sexual activity. Eating habits. History of falls. Memory and ability to understand (cognition). Work and work Statistician. Reproductive health. Screening  You may have the following tests or measurements: Height, weight, and BMI. Blood pressure. Lipid and cholesterol levels. These may be checked every 5 years, or more frequently if you are over 84 years old. Skin check. Lung cancer screening. You may have this screening every year starting at age 62 if you have a 30-pack-year history of smoking and currently smoke or have quit within the past 15 years. Fecal occult blood test (FOBT) of the stool. You may have this test every year starting at age 82. Flexible sigmoidoscopy or colonoscopy. You may have a sigmoidoscopy every 5 years or a colonoscopy every 10 years starting at age 43. Hepatitis C blood test. Hepatitis B blood test. Sexually transmitted disease (STD) testing. Diabetes screening. This is done by checking your blood sugar (glucose) after you have not eaten for a while (fasting). You may have this done every 1-3 years. Bone density scan. This is done to screen for osteoporosis. You may have this done starting at age 60. Mammogram. This may be done every 1-2 years. Talk to your health care provider about how often you should have regular mammograms. Talk with your health care provider about your test results, treatment options, and if necessary, the need for more tests.  Vaccines  Your health  care provider may recommend certain vaccines, such as: Influenza vaccine. This is recommended every year. Tetanus, diphtheria, and acellular pertussis (Tdap, Td) vaccine. You may need a Td booster every 10 years. Zoster vaccine. You may need this after age 68. Pneumococcal 13-valent conjugate (PCV13) vaccine. One dose is recommended after age 72. Pneumococcal polysaccharide (PPSV23) vaccine. One dose is recommended after age 64. Talk to your health care provider about which screenings and vaccines you need and how often you need them. This information is not intended to replace advice given to you by your health care provider. Make sure you discuss any questions you have with your health care provider. Document Released: 06/18/2015 Document Revised: 02/09/2016 Document Reviewed: 03/23/2015 Elsevier Interactive Patient Education  2017 [employer] Prevention in the Home Falls can cause injuries. They can happen to people of all ages. There are many things you can do to make your home safe and to help prevent falls. What can I do on the outside of my home? Regularly fix the edges of walkways and driveways and fix any cracks. Remove anything that might make you trip as you walk through a door, such as a raised step or threshold. Trim any bushes or trees on the path to your home. Use bright outdoor lighting. Clear any walking paths of anything that might make someone trip, such as rocks or tools. Regularly check to see if handrails are loose or broken. Make sure that both sides of any steps have handrails. Any raised decks and porches should have guardrails on the edges. Have any leaves, snow, or ice cleared regularly. Use sand or salt on walking paths during winter. Clean up any spills in your garage right away. This includes oil or grease spills. What can I do in the bathroom? Use night lights. Install grab bars by the toilet and in the tub and shower. Do not use towel bars as grab  bars. Use non-skid mats or decals in the tub or shower. If you need to sit down in the shower, use a plastic, non-slip stool. Keep the floor dry. Clean up any water that spills on the floor as soon as it happens. Remove soap buildup in the tub or shower regularly. Attach bath mats securely with double-sided non-slip rug tape. Do not have throw rugs and other things on the floor that can make you trip. What can I do in the bedroom? Use night lights. Make sure that you have a light by your bed that is easy to reach. Do not use any sheets or blankets that are too big for your bed. They should not hang down onto the floor. Have a firm chair that has side arms. You can use this for support while you get dressed. Do not have throw rugs and other things on the floor that can make you trip. What can I do in the kitchen? Clean up any spills right away. Avoid walking on wet floors. Keep items that you use a lot in easy-to-reach places. If you need to reach something above you, use a strong step stool that has a grab bar. Keep electrical cords out of the way. Do not use floor polish or wax that makes floors slippery. If you must use wax, use non-skid floor wax. Do not have throw rugs and other things on the floor that can make you trip. What can I do with my stairs? Do not leave any items on the stairs. Make sure that  there are handrails on both sides of the stairs and use them. Fix handrails that are broken or loose. Make sure that handrails are as long as the stairways. Check any carpeting to make sure that it is firmly attached to the stairs. Fix any carpet that is loose or worn. Avoid having throw rugs at the top or bottom of the stairs. If you do have throw rugs, attach them to the floor with carpet tape. Make sure that you have a light switch at the top of the stairs and the bottom of the stairs. If you do not have them, ask someone to add them for you. What else can I do to help prevent  falls? Wear shoes that: Do not have high heels. Have rubber bottoms. Are comfortable and fit you well. Are closed at the toe. Do not wear sandals. If you use a stepladder: Make sure that it is fully opened. Do not climb a closed stepladder. Make sure that both sides of the stepladder are locked into place. Ask someone to hold it for you, if possible. Clearly mark and make sure that you can see: Any grab bars or handrails. First and last steps. Where the edge of each step is. Use tools that help you move around (mobility aids) if they are needed. These include: Canes. Walkers. Scooters. Crutches. Turn on the lights when you go into a dark area. Replace any light bulbs as soon as they burn out. Set up your furniture so you have a clear path. Avoid moving your furniture around. If any of your floors are uneven, fix them. If there are any pets around you, be aware of where they are. Review your medicines with your doctor. Some medicines can make you feel dizzy. This can increase your chance of falling. Ask your doctor what other things that you can do to help prevent falls. This information is not intended to replace advice given to you by your health care provider. Make sure you discuss any questions you have with your health care provider. Document Released: 03/18/2009 Document Revised: 10/28/2015 Document Reviewed: 06/26/2014 Elsevier Interactive Patient Education  2017 [employer].

## 2021-02-17 NOTE — Progress Notes (Signed)
Subjective:  Patient ID: Kathleen Mejia, female    DOB: June 16, 1933  Age: 87 y.o. MRN: [id number]  CC: Follow-up   HPI Kathleen Mejia presents for hypertension, leg swelling, dementia.  The patient is here with her son Kathleen Mejia who helps with history  Outpatient Medications Prior to Visit  Medication Sig Dispense Refill   Ascorbic Acid (VITAMIN C) 1000 MG tablet Take 1,000 mg by mouth daily.      aspirin EC 81 MG tablet Take 2 tablets (162 mg total) by mouth daily. 100 tablet 3   bumetanide (BUMEX) 0.5 MG tablet TAKE 1 TABLET(0.5 MG) BY MOUTH DAILY WITH BREAKFAST 90 tablet 3   Cod Liver Oil 1000 MG CAPS Take 1,000 mg by mouth daily.      Coenzyme Q10 (CO Q 10 PO) Take 250 mg by mouth daily.      CRANBERRY PO Take 1 capsule by mouth daily.      docusate sodium (COLACE) 100 MG capsule Take 1 capsule (100 mg total) by mouth 2 (two) times daily. 60 capsule 0   donepezil (ARICEPT) 5 MG tablet Take 1 tablet (5 mg total) by mouth at bedtime. 90 tablet 3   FLAXSEED, LINSEED, PO Take 0.5 Scoops by mouth daily. PATIENT SPRINKLE FLAX SEEDS ONTO HER CEREAL DAILY     Garlic 856 MG CAPS Take 500 mg by mouth daily.      Multiple Minerals (MULTI-MINERALS PO) Take 1 tablet by mouth daily.     polyethylene glycol (MIRALAX / GLYCOLAX) 17 g packet Take 17 g by mouth daily as needed for mild constipation. 14 each 0   potassium chloride (KLOR-CON) 10 MEQ tablet TAKE 1 TABLET(10 MEQ) BY MOUTH DAILY 90 tablet 3   simvastatin (ZOCOR) 5 MG tablet TAKE 1 TABLET(5 MG) BY MOUTH DAILY 30 tablet 4   Zinc Sulfate (ZINC 15 PO) Take 50 mg by mouth daily.      No facility-administered medications prior to visit.    ROS: Review of Systems  Constitutional:  Negative for activity change, appetite change, chills, fatigue and unexpected weight change.  HENT:  Negative for congestion, mouth sores and sinus pressure.   Eyes:  Negative for visual disturbance.  Respiratory:  Negative for cough and chest tightness.    Cardiovascular:  Positive for leg swelling.  Gastrointestinal:  Negative for abdominal pain and nausea.  Genitourinary:  Negative for difficulty urinating, frequency and vaginal pain.  Musculoskeletal:  Positive for arthralgias, back pain and gait problem.  Skin:  Negative for pallor and rash.  Neurological:  Negative for dizziness, tremors, weakness, numbness and headaches.  Psychiatric/Behavioral:  Positive for decreased concentration. Negative for confusion and sleep disturbance.    Objective:  BP (!) 150/80   Pulse 77   Temp 98.4 F (36.9 C) (Oral)   Ht 5\' 1"  (1.549 m)   Wt 170 lb (77.1 kg)   SpO2 94%   BMI 32.12 kg/m   BP Readings from Last 3 Encounters:  02/17/21 (!) 150/80  02/17/21 (!) 150/80  12/27/20 126/82    Wt Readings from Last 3 Encounters:  02/17/21 170 lb (77.1 kg)  02/17/21 170 lb 6.4 oz (77.3 kg)  12/27/20 160 lb (72.6 kg)    Physical Exam Constitutional:      General: She is not in acute distress.    Appearance: She is well-developed. She is obese.  HENT:     Head: Normocephalic.     Right Ear: External ear normal.  Left Ear: External ear normal.     Nose: Nose normal.  Eyes:     General:        Right eye: No discharge.        Left eye: No discharge.     Conjunctiva/sclera: Conjunctivae normal.     Pupils: Pupils are equal, round, and reactive to light.  Neck:     Thyroid: No thyromegaly.     Vascular: No JVD.     Trachea: No tracheal deviation.  Cardiovascular:     Rate and Rhythm: Normal rate and regular rhythm.     Heart sounds: Normal heart sounds.  Pulmonary:     Effort: No respiratory distress.     Breath sounds: No stridor. No wheezing.  Abdominal:     General: Bowel sounds are normal. There is no distension.     Palpations: Abdomen is soft. There is no mass.     Tenderness: There is no abdominal tenderness. There is no guarding or rebound.  Musculoskeletal:        General: Tenderness present. No swelling.     Cervical  back: Normal range of motion and neck supple.     Right lower leg: Edema present.     Left lower leg: Edema present.  Lymphadenopathy:     Cervical: No cervical adenopathy.  Skin:    Findings: No erythema or rash.  Neurological:     Cranial Nerves: No cranial nerve deficit.     Motor: No abnormal muscle tone.     Coordination: Coordination normal.     Deep Tendon Reflexes: Reflexes normal.  Psychiatric:        Behavior: Behavior normal.        Thought Content: Thought content normal.        Judgment: Judgment normal.     Procedure:   L foot callus paring/cutting  Indication:    foot callus, painful  Consent: verbal  Risks and benefits were explained to the patient. Skin was cleaned with alcohol. I removed a large callus carefully with a round blade. Skin remained intact. Pain is better. Tolerated well. Complications: none. Bandaid applied  1+ edema on both feet.  She is alert and cooperative  Lab Results  Component Value Date   WBC 12.7 (H) 02/04/2019   HGB 10.2 (L) 02/04/2019   HCT 30.9 (L) 02/04/2019   PLT 251 02/04/2019   GLUCOSE 96 07/15/2020   CHOL 143 07/15/2020   TRIG 89.0 07/15/2020   HDL 49.80 07/15/2020   LDLDIRECT 139.3 02/21/2008   LDLCALC 76 07/15/2020   ALT 13 07/15/2020   AST 17 07/15/2020   NA 142 07/15/2020   K 3.8 07/15/2020   CL 102 07/15/2020   CREATININE 0.72 07/15/2020   BUN 14 07/15/2020   CO2 33 (H) 07/15/2020   TSH 1.77 07/31/2017   HGBA1C 5.5 01/29/2019   MICROALBUR 0.4 06/25/2008    MM 3D SCREEN BREAST BILATERAL  Result Date: 01/25/2021 CLINICAL DATA:  Screening. EXAM: DIGITAL SCREENING BILATERAL MAMMOGRAM WITH TOMOSYNTHESIS AND CAD TECHNIQUE: Bilateral screening digital craniocaudal and mediolateral oblique mammograms were obtained. Bilateral screening digital breast tomosynthesis was performed. The images were evaluated with computer-aided detection. COMPARISON:  Previous exam(s). ACR Breast Density Category b: There are scattered  areas of fibroglandular density. FINDINGS: There are no findings suspicious for malignancy. IMPRESSION: No mammographic evidence of malignancy. A result letter of this screening mammogram will be mailed directly to the patient. RECOMMENDATION: Screening mammogram in one year. (Code:SM-B-01Y) BI-RADS CATEGORY  1:  Negative. Electronically Signed   By: Dorise Bullion III M.D.   On: 01/25/2021 11:28    Assessment & Plan:   Problem List Items Addressed This Visit   None     Follow-up: No follow-ups on file.  Walker Kehr, MD

## 2021-02-17 NOTE — Progress Notes (Addendum)
Subjective:   Kathleen Mejia is a 87 y.o. female who presents for Medicare Annual (Subsequent) preventive examination.  Review of Systems     Cardiac Risk Factors include: advanced age (>1men, >51 women);dyslipidemia;family history of premature cardiovascular disease;hypertension;obesity (BMI >30kg/m2)     Objective:    Today's Vitals   02/17/21 1453 02/17/21 1533  BP:  (!) 150/80  Pulse:  77  Temp:  98.4 F (36.9 C)  SpO2:  94%  Weight:  170 lb 6.4 oz (77.3 kg)  Height:  5\' 1"  (1.549 m)  PainSc: 0-No pain 0-No pain   Body mass index is 32.2 kg/m.  Advanced Directives 02/17/2021 06/14/2020 02/12/2019 02/04/2019 01/28/2019 01/23/2019 01/10/2016  Does Patient Have a Medical Advance Directive? Yes Yes Yes Yes Yes No Yes  Type of Advance Directive Living will;Healthcare Power of [place];Living will - [place];Living will [place];Living will - -  Does patient want to make changes to medical advance directive? No - Patient declined - - - - - -  Copy of [place] in Chart? No - copy requested - - - No - copy requested - -    Current Medications (verified) Outpatient Encounter Medications as of 02/17/2021  Medication Sig   Ascorbic Acid (VITAMIN C) 1000 MG tablet Take 1,000 mg by mouth daily.    aspirin EC 81 MG tablet Take 2 tablets (162 mg total) by mouth daily.   Cod Liver Oil 1000 MG CAPS Take 1,000 mg by mouth daily.    Coenzyme Q10 (CO Q 10 PO) Take 250 mg by mouth daily.    CRANBERRY PO Take 1 capsule by mouth daily.    docusate sodium (COLACE) 100 MG capsule Take 1 capsule (100 mg total) by mouth 2 (two) times daily.   FLAXSEED, LINSEED, PO Take 0.5 Scoops by mouth daily. PATIENT SPRINKLE FLAX SEEDS ONTO HER CEREAL DAILY   Garlic 790 MG CAPS Take 500 mg by mouth daily.    Multiple Minerals (MULTI-MINERALS PO) Take 1 tablet by mouth daily.   polyethylene glycol (MIRALAX / GLYCOLAX) 17 g  packet Take 17 g by mouth daily as needed for mild constipation.   Zinc Sulfate (ZINC 15 PO) Take 50 mg by mouth daily.    [DISCONTINUED] bumetanide (BUMEX) 0.5 MG tablet TAKE 1 TABLET(0.5 MG) BY MOUTH DAILY WITH BREAKFAST   [DISCONTINUED] donepezil (ARICEPT) 5 MG tablet Take 1 tablet (5 mg total) by mouth at bedtime.   [DISCONTINUED] potassium chloride (KLOR-CON) 10 MEQ tablet TAKE 1 TABLET(10 MEQ) BY MOUTH DAILY   [DISCONTINUED] simvastatin (ZOCOR) 5 MG tablet TAKE 1 TABLET(5 MG) BY MOUTH DAILY   No facility-administered encounter medications on file as of 02/17/2021.    Allergies (verified) Eliquis [apixaban], Losartan, Maxzide [hydrochlorothiazide w-triamterene], and Metoprolol   History: Past Medical History:  Diagnosis Date   Anxiety    Atrial fibrillation ([place])    Cystocele with prolapse    Dislocated shoulder 2012   right   Edema    left leg   Fatty liver    Gait disturbance    Gallstone    1.5 cm   Ganglion cyst    Glaucoma    HTN (hypertension)    Hyperlipidemia    LBP (low back pain)    Obesity    Osteoarthritis    Rosacea    Shortness of breath    with activities and exertion, pt denies 01/23/2019   Venous insufficiency of  leg    Vertigo    not current 01/23/2019   Vitamin B 12 deficiency    Past Surgical History:  Procedure Laterality Date   ABDOMINAL HYSTERECTOMY     CHOLECYSTECTOMY N/A 03/27/2014   Procedure: LAPAROSCOPIC CHOLECYSTECTOMY WITH INTRAOPERATIVE CHOLANGIOGRAM;  Surgeon: Fanny Skates, MD;  Location: [place];  Service: General;  Laterality: N/A;   CYSTOCELE REPAIR N/A 01/28/2019   Procedure: ANTERIOR REPAIR (CYSTOCELE);  Surgeon: [name], MD;  Location: [facility];  Service: Gynecology;  Laterality: N/A;   CYSTOSCOPY N/A 01/28/2019   Procedure: CYSTOSCOPY;  Surgeon: [name], MD;  Location: [facility];  Service: Gynecology;  Laterality: N/A;   TONSILLECTOMY     VAGINAL HYSTERECTOMY Left  01/28/2019   Procedure: HYSTERECTOMY VAGINAL WITH LEFT SALPINGECTOMY;  Surgeon: [name], MD;  Location: [facility];  Service: Gynecology;  Laterality: Left;   Family History  Problem Relation Age of Onset   Pancreatic cancer Mother    Hypertension Mother    Breast cancer Mother    Kidney failure Sister    Social History   Socioeconomic History   Marital status: Widowed    Spouse name: Not on file   Number of children: Not on file   Years of education: Not on file   Highest education level: Not on file  Occupational History   Occupation: Retired  Tobacco Use   Smoking status: Never   Smokeless tobacco: Never  Vaping Use   Vaping Use: Never used  Substance and Sexual Activity   Alcohol use: No   Drug use: Never   Sexual activity: Not Currently    Birth control/protection: Post-menopausal  Other Topics Concern   Not on file  Social History Narrative   Not taking vaccines         Social Determinants of Health   Financial Resource Strain: Low Risk    Difficulty of Paying Living Expenses: Not hard at all  Food Insecurity: No Food Insecurity   Worried About Charity fundraiser in the Last Year: Never true   [place] in the Last Year: Never true  Transportation Needs: No Transportation Needs   Lack of Transportation (Medical): No   Lack of Transportation (Non-Medical): No  Physical Activity: Not on file  Stress: No Stress Concern Present   Feeling of Stress : Not at all  Social Connections: Moderately Integrated   Frequency of Communication with Friends and Family: More than three times a week   Frequency of Social Gatherings with Friends and Family: More than three times a week   Attends Religious Services: 1 to 4 times per year   Active Member of [employer] or Organizations: Yes   Attends Archivist Meetings: 1 to 4 times per year   Marital Status: Widowed    Tobacco Counseling Counseling given: Not Answered   Clinical  Intake:  Pre-visit preparation completed: Yes  Pain : No/denies pain Pain Score: 0-No pain     BMI - recorded: 32.2 Nutritional Status: BMI > 30  Obese Nutritional Risks: None Diabetes: No  How often do you need to have someone help you when you read instructions, pamphlets, or other written materials from your doctor or pharmacy?: 1 - Never What is the last grade level you completed in school?: High School Graduate  Diabetic? no  Interpreter Needed?: No  Information entered by :: Lisette Abu, LPN   Activities of Daily Living In your present state  of health, do you have any difficulty performing the following activities: 02/17/2021 10/27/2020  Hearing? N N  Vision? Y Y  Difficulty concentrating or making decisions? Y N  Walking or climbing stairs? Y Y  Comment uses a walker -  Dressing or bathing? N N  Doing errands, shopping? Y N  Preparing Food and eating ? N -  Using the Toilet? N -  In the past six months, have you accidently leaked urine? N -  Do you have problems with loss of bowel control? N -  Managing your Medications? N -  Managing your Finances? N -  Housekeeping or managing your Housekeeping? N -  Some recent data might be hidden    Patient Care Team: Plotnikov, Evie Lacks, MD as PCP - General Darleen Crocker, MD as Consulting Physician (Ophthalmology)  Indicate any recent Medical Services you may have received from other than [facility] providers in the past year (date may be approximate).     Assessment:   This is a routine wellness examination for [place].  Hearing/Vision screen Hearing Screening - Comments:: Patient denied any hearing difficulty. Vision Screening - Comments:: Patient having some vision issues.  Will be making an appointment to see [place].  Dietary issues and exercise activities discussed: Current Exercise Habits: The patient does not participate in regular exercise at present, Exercise limited by: cardiac  condition(s);orthopedic condition(s)   Goals Addressed               This Visit's Progress     Patient Stated (pt-stated)        I want to get my complete balance back.      Depression Screen PHQ 2/9 Scores 02/17/2021 05/04/2020 04/24/2019 02/18/2018 02/01/2017 01/10/2016 07/29/2014  PHQ - 2 Score 0 0 0 0 0 0 0    Fall Risk Fall Risk  02/17/2021 06/14/2020 05/04/2020 02/18/2018 02/01/2017  Falls in the past year? 0 0 0 No No  Comment - - - - -  Number falls in past yr: 0 0 0 - -  Injury with Fall? 0 0 0 - -  Risk for fall due to : No Fall Risks - - - -    FALL RISK PREVENTION PERTAINING TO THE HOME:  Any stairs in or around the home? No  If so, are there any without handrails? No  Home free of loose throw rugs in walkways, pet beds, electrical cords, etc? Yes  Adequate lighting in your home to reduce risk of falls? Yes   ASSISTIVE DEVICES UTILIZED TO PREVENT FALLS:  Life alert? No  Use of a cane, walker or w/c? Yes  Grab bars in the bathroom? Yes  Shower chair or bench in shower? Yes  Elevated toilet seat or a handicapped toilet? Yes   TIMED UP AND GO:  Was the test performed? Yes .  Length of time to ambulate 10 feet: 12 sec.   Gait slow and steady with assistive device  Cognitive Function: Normal cognitive status assessed by direct observation by this Nurse Health Advisor. No abnormalities found.   MMSE - Mini Mental State Exam 01/10/2016  Not completed: (No Data)        Immunizations Immunization History  Administered Date(s) Administered   PFIZER(Purple Top)SARS-COV-2 Vaccination 08/10/2019, 09/09/2019, 05/08/2020    TDAP status: declined  Flu Vaccine status: Declined, Education has been provided regarding the importance of this vaccine but patient still declined. Advised may receive this vaccine at local pharmacy or Health Dept. Aware to provide  a copy of the vaccination record if obtained from local pharmacy or Health Dept. Verbalized acceptance and  understanding.  Pneumococcal vaccine status: Declined,  Education has been provided regarding the importance of this vaccine but patient still declined. Advised may receive this vaccine at local pharmacy or Health Dept. Aware to provide a copy of the vaccination record if obtained from local pharmacy or Health Dept. Verbalized acceptance and understanding.   Covid-19 vaccine status: Completed vaccines  Qualifies for Shingles Vaccine? Yes   Zostavax completed No   Shingrix Completed?: No.    Education has been provided regarding the importance of this vaccine. Patient has been advised to call insurance company to determine out of pocket expense if they have not yet received this vaccine. Advised may also receive vaccine at local pharmacy or Health Dept. Verbalized acceptance and understanding.  Screening Tests Health Maintenance  Topic Date Due   TETANUS/TDAP  Never done   Zoster Vaccines- Shingrix (1 of 2) Never done   PNA vac Low Risk Adult (1 of 2 - PCV13) Never done   COVID-19 Vaccine (4 - Booster for [employer] series) 07/31/2020   DEXA SCAN  Completed   HPV VACCINES  Aged Out   INFLUENZA VACCINE  Discontinued    Health Maintenance  Health Maintenance Due  Topic Date Due   TETANUS/TDAP  Never done   Zoster Vaccines- Shingrix (1 of 2) Never done   PNA vac Low Risk Adult (1 of 2 - PCV13) Never done   COVID-19 Vaccine (4 - Booster for [employer] series) 07/31/2020    Colorectal cancer screening: No longer required.   Mammogram status: Completed 01/21/2021. Repeat every year  Bone density status: Completed 06/25/2013; no longer recommended  Lung Cancer Screening: (Low Dose CT Chest recommended if Age 69-80 years, 30 pack-year currently smoking OR have quit w/in 15years.) does not qualify.   Lung Cancer Screening Referral: no  Additional Screening:  Hepatitis C Screening: does not qualify; Completed no  Vision Screening: Recommended annual ophthalmology exams for early detection of  glaucoma and other disorders of the eye. Is the patient up to date with their annual eye exam?  Yes  Who is the provider or what is the name of the office in which the patient attends annual eye exams? [facility] If pt is not established with a provider, would they like to be referred to a provider to establish care? No .   Dental Screening: Recommended annual dental exams for proper oral hygiene  Community Resource Referral / Chronic Care Management: CRR required this visit?  No   CCM required this visit?  No      Plan:     I have personally reviewed and noted the following in the patient's chart:   Medical and social history Use of alcohol, tobacco or illicit drugs  Current medications and supplements including opioid prescriptions.  Functional ability and status Nutritional status Physical activity Advanced directives List of other physicians Hospitalizations, surgeries, and ER visits in previous 12 months Vitals Screenings to include cognitive, depression, and falls Referrals and appointments  In addition, I have reviewed and discussed with patient certain preventive protocols, quality metrics, and best practice recommendations. A written personalized care plan for preventive services as well as general preventive health recommendations were provided to patient.     Sheral Flow, LPN   06/05/7508   Nurse Notes:  Hearing Screening - Comments:: Patient denied any hearing difficulty. Vision Screening - Comments:: Patient having some vision issues.  Will be making an appointment to see [place].   Medical screening examination/treatment/procedure(s) were performed by non-physician practitioner and as supervising physician I was immediately available for consultation/collaboration.  I agree with above. Lew Dawes, MD

## 2021-02-18 LAB — CBC WITH DIFFERENTIAL/PLATELET
Basophils Absolute: 0.1 10*3/uL (ref 0.0–0.1)
Basophils Relative: 0.9 % (ref 0.0–3.0)
Eosinophils Absolute: 0.1 10*3/uL (ref 0.0–0.7)
Eosinophils Relative: 1.4 % (ref 0.0–5.0)
HCT: 45.9 % (ref 36.0–46.0)
Hemoglobin: 15.3 g/dL — ABNORMAL HIGH (ref 12.0–15.0)
Lymphocytes Relative: 24.5 % (ref 12.0–46.0)
Lymphs Abs: 1.9 10*3/uL (ref 0.7–4.0)
MCHC: 33.4 g/dL (ref 30.0–36.0)
MCV: 95.9 fl (ref 78.0–100.0)
Monocytes Absolute: 0.7 10*3/uL (ref 0.1–1.0)
Monocytes Relative: 8.6 % (ref 3.0–12.0)
Neutro Abs: 5.1 10*3/uL (ref 1.4–7.7)
Neutrophils Relative %: 64.6 % (ref 43.0–77.0)
Platelets: 180 10*3/uL (ref 150.0–400.0)
RBC: 4.78 Mil/uL (ref 3.87–5.11)
RDW: 13.1 % (ref 11.5–15.5)
WBC: 7.9 10*3/uL (ref 4.0–10.5)

## 2021-02-18 LAB — VITAMIN B12: Vitamin B-12: 487 pg/mL (ref 211–911)

## 2021-02-18 LAB — TSH: TSH: 1.26 u[IU]/mL (ref 0.35–5.50)

## 2021-02-18 LAB — COMPREHENSIVE METABOLIC PANEL
ALT: 18 U/L (ref 0–35)
AST: 17 U/L (ref 0–37)
Albumin: 4.1 g/dL (ref 3.5–5.2)
Alkaline Phosphatase: 79 U/L (ref 39–117)
BUN: 16 mg/dL (ref 6–23)
CO2: 31 mEq/L (ref 19–32)
Calcium: 9.7 mg/dL (ref 8.4–10.5)
Chloride: 104 mEq/L (ref 96–112)
Creatinine, Ser: 0.73 mg/dL (ref 0.40–1.20)
GFR: 74.86 mL/min (ref >60.00)
Glucose, Bld: 112 mg/dL — ABNORMAL HIGH (ref 70–99)
Potassium: 4.3 mEq/L (ref 3.5–5.1)
Sodium: 143 mEq/L (ref 135–145)
Total Bilirubin: 0.6 mg/dL (ref 0.2–1.2)
Total Protein: 7.2 g/dL (ref 6.0–8.3)

## 2021-02-18 LAB — HEMOGLOBIN A1C: Hgb A1c MFr Bld: 5.8 % (ref 4.6–6.5)

## 2021-02-18 LAB — LIPID PANEL
Cholesterol: 148 mg/dL (ref 0–200)
HDL: 50.8 mg/dL (ref >39.00)
LDL Cholesterol: 81 mg/dL (ref 0–99)
NonHDL: 97.36
Total CHOL/HDL Ratio: 3
Triglycerides: 82 mg/dL (ref 0.0–149.0)
VLDL: 16.4 mg/dL (ref 0.0–40.0)

## 2021-02-20 NOTE — Assessment & Plan Note (Addendum)
On Bumex, off torsemide Obtain blood chemistry

## 2021-02-20 NOTE — Assessment & Plan Note (Signed)
On Bumex.  Off torsemide

## 2021-03-07 ENCOUNTER — Ambulatory Visit: Payer: Medicare Other

## 2021-03-17 ENCOUNTER — Other Ambulatory Visit: Payer: Self-pay

## 2021-03-17 ENCOUNTER — Ambulatory Visit (INDEPENDENT_AMBULATORY_CARE_PROVIDER_SITE_OTHER): Payer: Medicare Other

## 2021-03-17 DIAGNOSIS — E538 Deficiency of other specified B group vitamins: Secondary | ICD-10-CM | POA: Diagnosis not present

## 2021-03-17 MED ORDER — CYANOCOBALAMIN 1000 MCG/ML IJ SOLN
1000.0000 ug | Freq: Once | INTRAMUSCULAR | Status: AC
  Administered 2021-03-17: 1000 ug via INTRAMUSCULAR

## 2021-03-17 NOTE — Progress Notes (Addendum)
Pt came into the office to receive her Vitamin B-12. She tolerated the injection well.   Medical screening examination/treatment/procedure(s) were performed by non-physician practitioner and as supervising physician I was immediately available for consultation/collaboration.  I agree with above. Lew Dawes, MD

## 2021-04-18 ENCOUNTER — Ambulatory Visit: Payer: Medicare Other

## 2021-05-03 ENCOUNTER — Telehealth: Payer: Self-pay | Admitting: Internal Medicine

## 2021-05-03 NOTE — Telephone Encounter (Signed)
Patient daughter calling in  Patient has been coughing & congested for about 3-4 days  Daughter says every one in the house has recently had a upper resp infection & she believes patient now has one  Daughter is living to go back home out of town on Thursday 12/01 & she wanted to know if provider can send something over to pharmacy for patient to clear up symptoms  [place], [place] - [address]  Phone:  [phone] Fax:  [phone]   Callback # [phone]

## 2021-05-03 NOTE — Telephone Encounter (Signed)
MD is still out of the office until Dec 1st.. due to symptoms pt will need to make appt w/ any provider before any anything can be prescribe. Pls make appt.Marland KitchenJohny Mejia

## 2021-05-04 ENCOUNTER — Ambulatory Visit: Payer: Medicare Other

## 2021-05-05 ENCOUNTER — Ambulatory Visit (INDEPENDENT_AMBULATORY_CARE_PROVIDER_SITE_OTHER): Payer: Medicare Other | Admitting: Internal Medicine

## 2021-05-05 VITALS — BP 110/80 | HR 59 | Temp 98.0°F | Wt 162.6 lb

## 2021-05-05 DIAGNOSIS — J069 Acute upper respiratory infection, unspecified: Secondary | ICD-10-CM | POA: Diagnosis not present

## 2021-05-05 DIAGNOSIS — R6889 Other general symptoms and signs: Secondary | ICD-10-CM | POA: Diagnosis not present

## 2021-05-05 LAB — POC COVID19 BINAXNOW: SARS Coronavirus 2 Ag: NEGATIVE

## 2021-05-05 LAB — POCT INFLUENZA A/B
Influenza A, POC: NEGATIVE
Influenza B, POC: NEGATIVE

## 2021-05-05 NOTE — Progress Notes (Signed)
Acute office Visit     This visit occurred during the SARS-CoV-2 public health emergency.  Safety protocols were in place, including screening questions prior to the visit, additional usage of staff PPE, and extensive cleaning of exam room while observing appropriate contact time as indicated for disinfecting solutions.    CC/Reason for Visit: URI symptoms  HPI: Kathleen Mejia is a 87 y.o. female who is coming in today for the above mentioned reasons.  She had family over for Thanksgiving.  Some of them were sick with coughing.  She has been having a runny nose and a cough for 2 days productive of white sputum.  She does believe that the cough has improved over the last 24 hours.  She has not had shortness of breath, fever, chills, body aches, emesis.  Past Medical/Surgical History: Past Medical History:  Diagnosis Date   Anxiety    Atrial fibrillation ([place])    Cystocele with prolapse    Dislocated shoulder 2012   right   Edema    left leg   Fatty liver    Gait disturbance    Gallstone    1.5 cm   Ganglion cyst    Glaucoma    HTN (hypertension)    Hyperlipidemia    LBP (low back pain)    Obesity    Osteoarthritis    Rosacea    Shortness of breath    with activities and exertion, pt denies 01/23/2019   Venous insufficiency of leg    Vertigo    not current 01/23/2019   Vitamin B 12 deficiency     Past Surgical History:  Procedure Laterality Date   ABDOMINAL HYSTERECTOMY     CHOLECYSTECTOMY N/A 03/27/2014   Procedure: LAPAROSCOPIC CHOLECYSTECTOMY WITH INTRAOPERATIVE CHOLANGIOGRAM;  Surgeon: Fanny Skates, MD;  Location: [facility];  Service: General;  Laterality: N/A;   CYSTOCELE REPAIR N/A 01/28/2019   Procedure: ANTERIOR REPAIR (CYSTOCELE);  Surgeon: [name], MD;  Location: [facility];  Service: Gynecology;  Laterality: N/A;   CYSTOSCOPY N/A 01/28/2019   Procedure: CYSTOSCOPY;  Surgeon: [name], MD;  Location: [facility];  Service: Gynecology;  Laterality: N/A;   TONSILLECTOMY     VAGINAL HYSTERECTOMY Left 01/28/2019   Procedure: HYSTERECTOMY VAGINAL WITH LEFT SALPINGECTOMY;  Surgeon: [name], MD;  Location: [facility];  Service: Gynecology;  Laterality: Left;    Social History:  reports that she has never smoked. She has never used smokeless tobacco. She reports that she does not drink alcohol and does not use drugs.  Allergies: Allergies  Allergen Reactions   Eliquis [Apixaban]     HAs, achy   Losartan     "all side effects and an article from [employer] re Valsartan recall"   Maxzide [Hydrochlorothiazide W-Triamterene]     Cramps w/a high dose   Metoprolol     achy    Family History:  Family History  Problem Relation Age of Onset   Pancreatic cancer Mother    Hypertension Mother    Breast cancer Mother    Kidney failure Sister      Current Outpatient Medications:    Ascorbic Acid (VITAMIN C) 1000 MG tablet, Take 1,000 mg by mouth daily. , Disp: , Rfl:    bumetanide (BUMEX) 0.5 MG tablet, TAKE 1 TABLET(0.5 MG) BY MOUTH DAILY WITH BREAKFAST, Disp: 90 tablet, Rfl: 3   Cod Liver Oil 1000 MG CAPS, Take 1,000 mg by mouth  daily. , Disp: , Rfl:    Coenzyme Q10 (CO Q 10 PO), Take 250 mg by mouth daily. , Disp: , Rfl:    CRANBERRY PO, Take 1 capsule by mouth daily. , Disp: , Rfl:    docusate sodium (COLACE) 100 MG capsule, Take 1 capsule (100 mg total) by mouth 2 (two) times daily., Disp: 60 capsule, Rfl: 0   donepezil (ARICEPT) 5 MG tablet, Take 1 tablet (5 mg total) by mouth at bedtime., Disp: 90 tablet, Rfl: 3   FLAXSEED, LINSEED, PO, Take 0.5 Scoops by mouth daily. PATIENT SPRINKLE FLAX SEEDS ONTO HER CEREAL DAILY, Disp: , Rfl:    Garlic 465 MG CAPS, Take 500 mg by mouth daily. , Disp: , Rfl:    Multiple Minerals (MULTI-MINERALS PO), Take 1 tablet by mouth daily., Disp: , Rfl:    polyethylene glycol (MIRALAX / GLYCOLAX) 17 g packet, Take 17 g by mouth daily  as needed for mild constipation., Disp: 14 each, Rfl: 0   potassium chloride (KLOR-CON) 10 MEQ tablet, TAKE 1 TABLET(10 MEQ) BY MOUTH DAILY, Disp: 90 tablet, Rfl: 3   Zinc Sulfate (ZINC 15 PO), Take 50 mg by mouth daily. , Disp: , Rfl:   Review of Systems:  Constitutional: Denies fever, chills, diaphoresis, appetite change and fatigue.  HEENT: Denies photophobia, eye pain, redness, hearing loss,  mouth sores, trouble swallowing, neck pain, neck stiffness and tinnitus.   Respiratory: Denies SOB, DOE, cough, chest tightness,  and wheezing.   Cardiovascular: Denies chest pain, palpitations and leg swelling.  Gastrointestinal: Denies nausea, vomiting, abdominal pain, diarrhea, constipation, blood in stool and abdominal distention.  Genitourinary: Denies dysuria, urgency, frequency, hematuria, flank pain and difficulty urinating.  Endocrine: Denies: hot or cold intolerance, sweats, changes in hair or nails, polyuria, polydipsia. Musculoskeletal: Denies myalgias, back pain, joint swelling, arthralgias and gait problem.  Skin: Denies pallor, rash and wound.  Neurological: Denies dizziness, seizures, syncope, weakness, light-headedness, numbness and headaches.  Hematological: Denies adenopathy. Easy bruising, personal or family bleeding history  Psychiatric/Behavioral: Denies suicidal ideation, mood changes, confusion, nervousness, sleep disturbance and agitation    Physical Exam: Vitals:   05/05/21 1325  BP: 110/80  Pulse: (!) 59  Temp: 98 F (36.7 C)  TempSrc: Oral  SpO2: 95%  Weight: 162 lb 9.6 oz (73.8 kg)    Body mass index is 30.72 kg/m.   Constitutional: NAD, calm, comfortable, ambulates with a walker Eyes: PERRL, lids and conjunctivae normal ENMT: Mucous membranes are moist.  Respiratory: clear to auscultation bilaterally, no wheezing, no crackles. Normal respiratory effort. No accessory muscle use.  Cardiovascular: Regular rate and rhythm, no murmurs / rubs / gallops. No  extremity edema.  Psychiatric: Normal judgment and insight. Alert and oriented x 3. Normal mood.    Impression and Plan:  Flu-like symptoms - Plan: POC Influenza A/B, POC COVID-19 BinaxNow  Viral URI with cough  -In office flu and COVID tests are negative. -Given exam findings, PNA, pharyngitis, ear infection are not likely, hence abx have not been prescribed. -Have advised rest, fluids, OTC antihistamines, cough suppressants and mucinex. -RTC if no improvement in 10-14 days.    Time spent: 22 minutes reviewing chart, interviewing and examining patient and formulating plan of care.   Patient Instructions  -Nice seeing you today!!  -Can try over the counter medications such as claritin daily and mucinex twice daily to help with congestion and cough.  -Come back to see [place] in 7-10 days if no improvement.  Deara Bober Hernandez Acosta, MD [facility]   

## 2021-05-05 NOTE — Patient Instructions (Signed)
-  Nice seeing you today!!  -Can try over the counter medications such as claritin daily and mucinex twice daily to help with congestion and cough.  -Come back to see [place] in 7-10 days if no improvement.

## 2021-06-03 ENCOUNTER — Ambulatory Visit (INDEPENDENT_AMBULATORY_CARE_PROVIDER_SITE_OTHER): Payer: Medicare Other

## 2021-06-03 DIAGNOSIS — E538 Deficiency of other specified B group vitamins: Secondary | ICD-10-CM

## 2021-06-03 MED ORDER — CYANOCOBALAMIN 1000 MCG/ML IJ SOLN
1000.0000 ug | Freq: Once | INTRAMUSCULAR | Status: AC
  Administered 2021-06-03: 15:00:00 1000 ug via INTRAMUSCULAR

## 2021-06-03 NOTE — Progress Notes (Signed)
Pt here for monthly B12 injection per Dr. Alain Marion  B12 1073mcg given IM, and pt tolerated injection well.

## 2021-06-23 ENCOUNTER — Ambulatory Visit: Payer: Medicare Other | Admitting: Neurology

## 2021-06-24 ENCOUNTER — Other Ambulatory Visit: Payer: Self-pay | Admitting: Internal Medicine

## 2021-07-01 ENCOUNTER — Ambulatory Visit: Payer: Medicare Other | Admitting: Physician Assistant

## 2021-07-06 ENCOUNTER — Ambulatory Visit: Payer: Medicare Other

## 2021-07-14 ENCOUNTER — Ambulatory Visit: Payer: Medicare Other

## 2021-07-21 ENCOUNTER — Ambulatory Visit (INDEPENDENT_AMBULATORY_CARE_PROVIDER_SITE_OTHER): Payer: Medicare Other

## 2021-07-21 ENCOUNTER — Other Ambulatory Visit: Payer: Self-pay

## 2021-07-21 DIAGNOSIS — E538 Deficiency of other specified B group vitamins: Secondary | ICD-10-CM

## 2021-07-21 MED ORDER — CYANOCOBALAMIN 1000 MCG/ML IJ SOLN
1000.0000 ug | Freq: Once | INTRAMUSCULAR | Status: AC
  Administered 2021-07-21: 1000 ug via INTRAMUSCULAR

## 2021-07-21 NOTE — Progress Notes (Addendum)
Pt was given B12 w/o any complications. Medical screening examination/treatment/procedure(s) were performed by non-physician practitioner and as supervising physician I was immediately available for consultation/collaboration.  I agree with above. Aleksei Plotnikov, MD 

## 2021-07-23 ENCOUNTER — Other Ambulatory Visit: Payer: Self-pay | Admitting: Internal Medicine

## 2021-07-29 ENCOUNTER — Other Ambulatory Visit: Payer: Self-pay | Admitting: Internal Medicine

## 2021-08-02 ENCOUNTER — Ambulatory Visit: Payer: Medicare Other | Admitting: Physician Assistant

## 2021-08-16 ENCOUNTER — Ambulatory Visit: Payer: Medicare Other | Admitting: Physician Assistant

## 2021-08-17 ENCOUNTER — Encounter: Payer: Self-pay | Admitting: Internal Medicine

## 2021-08-17 ENCOUNTER — Ambulatory Visit (INDEPENDENT_AMBULATORY_CARE_PROVIDER_SITE_OTHER): Payer: Medicare Other | Admitting: Internal Medicine

## 2021-08-17 ENCOUNTER — Other Ambulatory Visit: Payer: Self-pay

## 2021-08-17 VITALS — BP 110/80 | HR 67 | Temp 97.9°F | Ht 61.0 in | Wt 166.0 lb

## 2021-08-17 DIAGNOSIS — E538 Deficiency of other specified B group vitamins: Secondary | ICD-10-CM | POA: Diagnosis not present

## 2021-08-17 DIAGNOSIS — R609 Edema, unspecified: Secondary | ICD-10-CM

## 2021-08-17 DIAGNOSIS — I10 Essential (primary) hypertension: Secondary | ICD-10-CM

## 2021-08-17 DIAGNOSIS — Z23 Encounter for immunization: Secondary | ICD-10-CM

## 2021-08-17 DIAGNOSIS — R7309 Other abnormal glucose: Secondary | ICD-10-CM

## 2021-08-17 DIAGNOSIS — R413 Other amnesia: Secondary | ICD-10-CM

## 2021-08-17 DIAGNOSIS — R269 Unspecified abnormalities of gait and mobility: Secondary | ICD-10-CM

## 2021-08-17 LAB — COMPREHENSIVE METABOLIC PANEL
ALT: 17 U/L (ref 0–35)
AST: 18 U/L (ref 0–37)
Albumin: 4.3 g/dL (ref 3.5–5.2)
Alkaline Phosphatase: 87 U/L (ref 39–117)
BUN: 20 mg/dL (ref 6–23)
CO2: 30 mEq/L (ref 19–32)
Calcium: 10.1 mg/dL (ref 8.4–10.5)
Chloride: 103 mEq/L (ref 96–112)
Creatinine, Ser: 0.67 mg/dL (ref 0.40–1.20)
GFR: 79.29 mL/min (ref >60.00)
Glucose, Bld: 104 mg/dL — ABNORMAL HIGH (ref 70–99)
Potassium: 3.9 mEq/L (ref 3.5–5.1)
Sodium: 142 mEq/L (ref 135–145)
Total Bilirubin: 0.5 mg/dL (ref 0.2–1.2)
Total Protein: 7.3 g/dL (ref 6.0–8.3)

## 2021-08-17 LAB — TSH: TSH: 1.61 u[IU]/mL (ref 0.35–5.50)

## 2021-08-17 LAB — HEMOGLOBIN A1C: Hgb A1c MFr Bld: 5.8 % (ref 4.6–6.5)

## 2021-08-17 MED ORDER — CYANOCOBALAMIN 1000 MCG/ML IJ SOLN
1000.0000 ug | Freq: Once | INTRAMUSCULAR | Status: AC
  Administered 2021-08-17: 1000 ug via INTRAMUSCULAR

## 2021-08-17 NOTE — Assessment & Plan Note (Signed)
Use a walker 

## 2021-08-17 NOTE — Addendum Note (Signed)
Addended by: Marijean Heath R on: 08/17/2021 05:00 PM ? ? Modules accepted: Orders ? ?

## 2021-08-17 NOTE — Assessment & Plan Note (Signed)
Aricept 5 mg a day ?

## 2021-08-17 NOTE — Assessment & Plan Note (Signed)
Better w/wt loss ? Cont on Bumex ?

## 2021-08-17 NOTE — Assessment & Plan Note (Signed)
Monitor A1c 

## 2021-08-17 NOTE — Assessment & Plan Note (Signed)
Chronic Bumex qam 

## 2021-08-17 NOTE — Progress Notes (Signed)
? ?Subjective:  ?Patient ID: Kathleen Mejia, female    DOB: 20-Apr-1934  Age: 88 y.o. MRN: [id number] ? ?CC: No chief complaint on file. ? ? ?HPI ?Kathleen Mejia presents for dementia, HTN ?C/o more confusion ? ? ?Outpatient Medications Prior to Visit  ?Medication Sig Dispense Refill  ? Ascorbic Acid (VITAMIN C) 1000 MG tablet Take 1,000 mg by mouth daily.     ? bumetanide (BUMEX) 0.5 MG tablet TAKE 1 TABLET(0.5 MG) BY MOUTH DAILY WITH BREAKFAST 90 tablet 3  ? Cod Liver Oil 1000 MG CAPS Take 1,000 mg by mouth daily.     ? Coenzyme Q10 (CO Q 10 PO) Take 250 mg by mouth daily.     ? CRANBERRY PO Take 1 capsule by mouth daily.     ? docusate sodium (COLACE) 100 MG capsule Take 1 capsule (100 mg total) by mouth 2 (two) times daily. 60 capsule 0  ? donepezil (ARICEPT) 5 MG tablet TAKE 1 TABLET(5 MG) BY MOUTH AT BEDTIME 90 tablet 3  ? FLAXSEED, LINSEED, PO Take 0.5 Scoops by mouth daily. PATIENT SPRINKLE FLAX SEEDS ONTO HER CEREAL DAILY    ? Garlic 371 MG CAPS Take 500 mg by mouth daily.     ? Multiple Minerals (MULTI-MINERALS PO) Take 1 tablet by mouth daily.    ? polyethylene glycol (MIRALAX / GLYCOLAX) 17 g packet Take 17 g by mouth daily as needed for mild constipation. 14 each 0  ? potassium chloride (KLOR-CON) 10 MEQ tablet TAKE 1 TABLET(10 MEQ) BY MOUTH DAILY 90 tablet 3  ? simvastatin (ZOCOR) 5 MG tablet Take 5 mg by mouth at bedtime.    ? Zinc Sulfate (ZINC 15 PO) Take 50 mg by mouth daily.     ? ?No facility-administered medications prior to visit.  ? ? ?ROS: ?Review of Systems  ?Constitutional:  Positive for fatigue. Negative for activity change, appetite change, chills and unexpected weight change.  ?HENT:  Negative for congestion, mouth sores and sinus pressure.   ?Eyes:  Negative for visual disturbance.  ?Respiratory:  Negative for cough and chest tightness.   ?Gastrointestinal:  Negative for abdominal pain and nausea.  ?Genitourinary:  Negative for difficulty urinating, frequency and vaginal pain.   ?Musculoskeletal:  Positive for back pain and gait problem.  ?Skin:  Negative for pallor and rash.  ?Neurological:  Negative for dizziness, tremors, weakness, numbness and headaches.  ?Psychiatric/Behavioral:  Positive for behavioral problems, confusion and decreased concentration. Negative for sleep disturbance and suicidal ideas. The patient is not nervous/anxious.   ? ?Objective:  ?BP 110/80 (BP Location: Left Arm, Patient Position: Sitting, Cuff Size: Large)   Pulse 67   Temp 97.9 ?F (36.6 ?C) (Oral)   Ht '5\' 1"'$  (1.549 m)   Wt 166 lb (75.3 kg)   SpO2 95%   BMI 31.37 kg/m?  ? ?BP Readings from Last 3 Encounters:  ?08/17/21 110/80  ?05/05/21 110/80  ?02/17/21 (!) 150/80  ? ? ?Wt Readings from Last 3 Encounters:  ?08/17/21 166 lb (75.3 kg)  ?05/05/21 162 lb 9.6 oz (73.8 kg)  ?02/17/21 170 lb (77.1 kg)  ? ? ?Physical Exam ?Constitutional:   ?   General: She is not in acute distress. ?   Appearance: She is well-developed. She is obese.  ?HENT:  ?   Head: Normocephalic.  ?   Right Ear: External ear normal.  ?   Left Ear: External ear normal.  ?   Nose: Nose normal.  ?Eyes:  ?  General:     ?   Right eye: No discharge.     ?   Left eye: No discharge.  ?   Conjunctiva/sclera: Conjunctivae normal.  ?   Pupils: Pupils are equal, round, and reactive to light.  ?Neck:  ?   Thyroid: No thyromegaly.  ?   Vascular: No JVD.  ?   Trachea: No tracheal deviation.  ?Cardiovascular:  ?   Rate and Rhythm: Normal rate and regular rhythm.  ?   Heart sounds: Normal heart sounds.  ?Pulmonary:  ?   Effort: No respiratory distress.  ?   Breath sounds: No stridor. No wheezing.  ?Abdominal:  ?   General: Bowel sounds are normal. There is no distension.  ?   Palpations: Abdomen is soft. There is no mass.  ?   Tenderness: There is no abdominal tenderness. There is no guarding or rebound.  ?Musculoskeletal:     ?   General: Tenderness present.  ?   Cervical back: Normal range of motion and neck supple. No rigidity.  ?Lymphadenopathy:  ?    Cervical: No cervical adenopathy.  ?Skin: ?   Findings: No erythema or rash.  ?Neurological:  ?   Mental Status: She is oriented to person, place, and time.  ?   Cranial Nerves: No cranial nerve deficit.  ?   Motor: No abnormal muscle tone.  ?   Coordination: Coordination abnormal.  ?   Gait: Gait abnormal.  ?   Deep Tendon Reflexes: Reflexes normal.  ?Psychiatric:     ?   Behavior: Behavior normal.     ?   Thought Content: Thought content normal.     ?   Judgment: Judgment normal.  ?Using a walker ?LLE 1+ ?RLE trace edema ? ?Lab Results  ?Component Value Date  ? WBC 7.9 02/17/2021  ? HGB 15.3 (H) 02/17/2021  ? HCT 45.9 02/17/2021  ? PLT 180.0 02/17/2021  ? GLUCOSE 112 (H) 02/17/2021  ? CHOL 148 02/17/2021  ? TRIG 82.0 02/17/2021  ? HDL 50.80 02/17/2021  ? LDLDIRECT 139.3 02/21/2008  ? [place] 81 02/17/2021  ? ALT 18 02/17/2021  ? AST 17 02/17/2021  ? NA 143 02/17/2021  ? K 4.3 02/17/2021  ? CL 104 02/17/2021  ? CREATININE 0.73 02/17/2021  ? BUN 16 02/17/2021  ? CO2 31 02/17/2021  ? TSH 1.26 02/17/2021  ? HGBA1C 5.8 02/17/2021  ? MICROALBUR 0.4 06/25/2008  ? ? ?MM 3D SCREEN BREAST BILATERAL ? ?Result Date: 01/25/2021 ?CLINICAL DATA:  Screening. EXAM: DIGITAL SCREENING BILATERAL MAMMOGRAM WITH TOMOSYNTHESIS AND CAD TECHNIQUE: Bilateral screening digital craniocaudal and mediolateral oblique mammograms were obtained. Bilateral screening digital breast tomosynthesis was performed. The images were evaluated with computer-aided detection. COMPARISON:  Previous exam(s). ACR Breast Density Category b: There are scattered areas of fibroglandular density. FINDINGS: There are no findings suspicious for malignancy. IMPRESSION: No mammographic evidence of malignancy. A result letter of this screening mammogram will be mailed directly to the patient. RECOMMENDATION: Screening mammogram in one year. (Code:SM-B-01Y) BI-RADS CATEGORY  1: Negative. Electronically Signed   By: Dorise Bullion III M.D.   On: 01/25/2021 11:28   ? ? ?Assessment & Plan:  ? ?Problem List Items Addressed This Visit   ? ? Edema  ?  Better w/wt loss ? Cont on Bumex ?  ?  ? Elevated glucose  ?  Monitor A1c ?  ?  ? Relevant Orders  ? Hemoglobin A1c  ? Essential hypertension  ?  Chronic ?Bumex qam ?  ?  ?  Relevant Medications  ? simvastatin (ZOCOR) 5 MG tablet  ? Other Relevant Orders  ? Urinalysis  ? Gait disorder  ?  Use a walker ?  ?  ? Memory loss - Primary  ?  Aricept 5 mg a day ?  ?  ? Relevant Orders  ? Comprehensive metabolic panel  ? TSH  ? Hemoglobin A1c  ? Urinalysis  ? Vitamin B12 deficiency  ?  ? ? ?Meds ordered this encounter  ?Medications  ? cyanocobalamin ((VITAMIN B-12)) injection 1,000 mcg  ?  ? ? ?Follow-up: Return in about 4 months (around 12/17/2021) for a follow-up visit. ? ?Walker Kehr, MD ?

## 2021-08-19 LAB — URINALYSIS, ROUTINE W REFLEX MICROSCOPIC
Bilirubin Urine: NEGATIVE
Hgb urine dipstick: NEGATIVE
Ketones, ur: NEGATIVE
Leukocytes,Ua: NEGATIVE
Nitrite: POSITIVE — AB
Specific Gravity, Urine: 1.02 (ref 1.000–1.030)
Urine Glucose: NEGATIVE
Urobilinogen, UA: 0.2 (ref 0.0–1.0)
pH: 7 (ref 5.0–8.0)

## 2021-08-21 ENCOUNTER — Other Ambulatory Visit: Payer: Self-pay | Admitting: Neurology

## 2021-08-22 ENCOUNTER — Ambulatory Visit: Payer: Medicare Other

## 2021-08-24 ENCOUNTER — Ambulatory Visit: Payer: Medicare Other | Admitting: Physician Assistant

## 2021-08-30 ENCOUNTER — Telehealth: Payer: Self-pay

## 2021-08-30 ENCOUNTER — Other Ambulatory Visit: Payer: Self-pay

## 2021-08-30 MED ORDER — BUMETANIDE 0.5 MG PO TABS: ORAL_TABLET | ORAL | 3 refills | Status: DC

## 2021-08-30 MED ORDER — SIMVASTATIN 5 MG PO TABS: 5.0000 mg | ORAL_TABLET | Freq: Every day | ORAL | 2 refills | Status: DC

## 2021-08-30 NOTE — Telephone Encounter (Signed)
Pt son calling to get results of Urine sample. I advise the pt that Dr. Alain Marion states that her urine test was normal.   ? ?Pt is now complaining of burning while urinating and also Frequency. ? ?Pt son states that she is sleeping a lot more than normal. ? ?Pt is also in need of a refill on: ?simvastatin (ZOCOR) 5 MG tablet ?bumetanide (BUMEX) 0.5 MG tablet ? ?Pharmacy: ?[id number] - [place], [place] ? ?LOV 08/17/21 ? ?Please advise ?

## 2021-08-31 NOTE — Telephone Encounter (Signed)
The problem is likely related to atrophic vaginitis and overactive bladder.  [name] can buy over-the-counter Azo and give it to his mother for several days once a day or twice a day to see if better.  This medicine will make her urine looked orange temporarily. ?Thanks ?

## 2021-09-02 NOTE — Telephone Encounter (Signed)
LDVM for pts son Kathleen Mejia with Dr. Enis Slipper instructions. ?

## 2021-09-23 ENCOUNTER — Ambulatory Visit: Payer: Medicare Other | Admitting: Physician Assistant

## 2021-10-21 ENCOUNTER — Ambulatory Visit: Payer: Medicare Other | Admitting: Physician Assistant

## 2021-11-03 ENCOUNTER — Ambulatory Visit: Payer: Medicare Other | Admitting: Physician Assistant

## 2021-11-24 ENCOUNTER — Ambulatory Visit: Payer: Medicare Other | Admitting: Physician Assistant

## 2021-12-20 NOTE — Progress Notes (Deleted)
88 y.o. G42P1002 Widowed White or Caucasian Not Hispanic or Latino female here for annual exam.      No LMP recorded. Patient has had a hysterectomy.          Sexually active: {yes no:314532}  The current method of family planning is {contraception:315051}.    Exercising: {yes no:314532}  {types:19826} Smoker:  {YES NO:22349}  Health Maintenance: Pap:  patient had total hysterectomy  History of abnormal Pap:  no MMG:  01/25/21 density B Bi-rads 1 neg  BMD:   06/25/13 mild bone loss  Colonoscopy: unsure  TDaP:  patient refused  Gardasil: n/a   reports that she has never smoked. She has never used smokeless tobacco. She reports that she does not drink alcohol and does not use drugs.  Past Medical History:  Diagnosis Date   Anxiety    Atrial fibrillation ([place])    Cystocele with prolapse    Dislocated shoulder 2012   right   Edema    left leg   Fatty liver    Gait disturbance    Gallstone    1.5 cm   Ganglion cyst    Glaucoma    HTN (hypertension)    Hyperlipidemia    LBP (low back pain)    Obesity    Osteoarthritis    Rosacea    Shortness of breath    with activities and exertion, pt denies 01/23/2019   Venous insufficiency of leg    Vertigo    not current 01/23/2019   Vitamin B 12 deficiency     Past Surgical History:  Procedure Laterality Date   ABDOMINAL HYSTERECTOMY     CHOLECYSTECTOMY N/A 03/27/2014   Procedure: LAPAROSCOPIC CHOLECYSTECTOMY WITH INTRAOPERATIVE CHOLANGIOGRAM;  Surgeon: Fanny Skates, MD;  Location: [place];  Service: General;  Laterality: N/A;   CYSTOCELE REPAIR N/A 01/28/2019   Procedure: ANTERIOR REPAIR (CYSTOCELE);  Surgeon: [name], MD;  Location: [facility];  Service: Gynecology;  Laterality: N/A;   CYSTOSCOPY N/A 01/28/2019   Procedure: CYSTOSCOPY;  Surgeon: [name], MD;  Location: [facility];  Service: Gynecology;  Laterality: N/A;   TONSILLECTOMY     VAGINAL HYSTERECTOMY Left  01/28/2019   Procedure: HYSTERECTOMY VAGINAL WITH LEFT SALPINGECTOMY;  Surgeon: [name], MD;  Location: [facility];  Service: Gynecology;  Laterality: Left;    Current Outpatient Medications  Medication Sig Dispense Refill   Ascorbic Acid (VITAMIN C) 1000 MG tablet Take 1,000 mg by mouth daily.      bumetanide (BUMEX) 0.5 MG tablet TAKE 1 TABLET AT BY MOUTH DAILY WITH BREAKFAST 90 tablet 3   Cod Liver Oil 1000 MG CAPS Take 1,000 mg by mouth daily.      Coenzyme Q10 (CO Q 10 PO) Take 250 mg by mouth daily.      CRANBERRY PO Take 1 capsule by mouth daily.      docusate sodium (COLACE) 100 MG capsule Take 1 capsule (100 mg total) by mouth 2 (two) times daily. 60 capsule 0   donepezil (ARICEPT) 5 MG tablet TAKE 1 TABLET(5 MG) BY MOUTH AT BEDTIME 90 tablet 3   FLAXSEED, LINSEED, PO Take 0.5 Scoops by mouth daily. PATIENT SPRINKLE FLAX SEEDS ONTO HER CEREAL DAILY     Garlic 778 MG CAPS Take 500 mg by mouth daily.      Multiple Minerals (MULTI-MINERALS PO) Take 1 tablet by mouth daily.     polyethylene glycol (MIRALAX / GLYCOLAX) 17 g packet  Take 17 g by mouth daily as needed for mild constipation. 14 each 0   potassium chloride (KLOR-CON) 10 MEQ tablet TAKE 1 TABLET(10 MEQ) BY MOUTH DAILY 90 tablet 3   simvastatin (ZOCOR) 5 MG tablet Take 1 tablet (5 mg total) by mouth at bedtime. 90 tablet 2   Zinc Sulfate (ZINC 15 PO) Take 50 mg by mouth daily.      No current facility-administered medications for this visit.    Family History  Problem Relation Age of Onset   Pancreatic cancer Mother    Hypertension Mother    Breast cancer Mother    Kidney failure Sister     Review of Systems  Exam:   There were no vitals taken for this visit.  Weight change: '@WEIGHTCHANGE'$ @ Height:      Ht Readings from Last 3 Encounters:  08/17/21 '5\' 1"'$  (1.549 m)  02/17/21 '5\' 1"'$  (1.549 m)  02/17/21 '5\' 1"'$  (1.549 m)    General appearance: alert, cooperative and appears stated  age Head: Normocephalic, without obvious abnormality, atraumatic Neck: no adenopathy, supple, symmetrical, trachea midline and thyroid {CHL AMB PHY EX THYROID NORM DEFAULT:[phone]::"normal to inspection and palpation"} Lungs: clear to auscultation bilaterally Cardiovascular: regular rate and rhythm Breasts: {Exam; breast:13139::"normal appearance, no masses or tenderness"} Abdomen: soft, non-tender; non distended,  no masses,  no organomegaly Extremities: extremities normal, atraumatic, no cyanosis or edema Skin: Skin color, texture, turgor normal. No rashes or lesions Lymph nodes: Cervical, supraclavicular, and axillary nodes normal. No abnormal inguinal nodes palpated Neurologic: Grossly normal   Pelvic: External genitalia:  no lesions              Urethra:  normal appearing urethra with no masses, tenderness or lesions              Bartholins and Skenes: normal                 Vagina: normal appearing vagina with normal color and discharge, no lesions              Cervix: {CHL AMB PHY EX CERVIX NORM DEFAULT:[phone]::"no lesions"}               Bimanual Exam:  Uterus:  {CHL AMB PHY EX UTERUS NORM DEFAULT:[phone]::"normal size, contour, position, consistency, mobility, non-tender"}              Adnexa: {CHL AMB PHY EX ADNEXA NO MASS [phone]::"no mass, fullness, tenderness"}               Rectovaginal: Confirms               Anus:  normal sphincter tone, no lesions  *** chaperoned for the exam.  A:  Well Woman with normal exam  P:

## 2021-12-22 ENCOUNTER — Ambulatory Visit: Payer: Medicare Other | Admitting: Physician Assistant

## 2021-12-28 ENCOUNTER — Ambulatory Visit: Payer: Medicare Other | Admitting: Obstetrics and Gynecology

## 2022-01-24 ENCOUNTER — Ambulatory Visit: Payer: Medicare Other | Admitting: Physician Assistant

## 2022-02-02 ENCOUNTER — Encounter: Payer: Self-pay | Admitting: Internal Medicine

## 2022-02-02 ENCOUNTER — Ambulatory Visit (INDEPENDENT_AMBULATORY_CARE_PROVIDER_SITE_OTHER): Payer: Medicare Other | Admitting: Internal Medicine

## 2022-02-02 VITALS — BP 130/72 | HR 73 | Temp 98.0°F | Ht 61.0 in | Wt 171.8 lb

## 2022-02-02 DIAGNOSIS — R7309 Other abnormal glucose: Secondary | ICD-10-CM

## 2022-02-02 DIAGNOSIS — E538 Deficiency of other specified B group vitamins: Secondary | ICD-10-CM

## 2022-02-02 DIAGNOSIS — B078 Other viral warts: Secondary | ICD-10-CM

## 2022-02-02 DIAGNOSIS — R609 Edema, unspecified: Secondary | ICD-10-CM

## 2022-02-02 DIAGNOSIS — B079 Viral wart, unspecified: Secondary | ICD-10-CM | POA: Insufficient documentation

## 2022-02-02 LAB — CBC WITH DIFFERENTIAL/PLATELET
Basophils Absolute: 0 10*3/uL (ref 0.0–0.1)
Basophils Relative: 0.4 % (ref 0.0–3.0)
Eosinophils Absolute: 0.1 10*3/uL (ref 0.0–0.7)
Eosinophils Relative: 1.2 % (ref 0.0–5.0)
HCT: 46.3 % — ABNORMAL HIGH (ref 36.0–46.0)
Hemoglobin: 15.5 g/dL — ABNORMAL HIGH (ref 12.0–15.0)
Lymphocytes Relative: 21.5 % (ref 12.0–46.0)
Lymphs Abs: 1.7 10*3/uL (ref 0.7–4.0)
MCHC: 33.5 g/dL (ref 30.0–36.0)
MCV: 96.3 fl (ref 78.0–100.0)
Monocytes Absolute: 0.7 10*3/uL (ref 0.1–1.0)
Monocytes Relative: 8.9 % (ref 3.0–12.0)
Neutro Abs: 5.5 10*3/uL (ref 1.4–7.7)
Neutrophils Relative %: 68 % (ref 43.0–77.0)
Platelets: 168 10*3/uL (ref 150.0–400.0)
RBC: 4.81 Mil/uL (ref 3.87–5.11)
RDW: 13.2 % (ref 11.5–15.5)
WBC: 8.1 10*3/uL (ref 4.0–10.5)

## 2022-02-02 LAB — COMPREHENSIVE METABOLIC PANEL
ALT: 18 U/L (ref 0–35)
AST: 20 U/L (ref 0–37)
Albumin: 4.2 g/dL (ref 3.5–5.2)
Alkaline Phosphatase: 72 U/L (ref 39–117)
BUN: 16 mg/dL (ref 6–23)
CO2: 30 mEq/L (ref 19–32)
Calcium: 9.6 mg/dL (ref 8.4–10.5)
Chloride: 104 mEq/L (ref 96–112)
Creatinine, Ser: 0.7 mg/dL (ref 0.40–1.20)
GFR: 78.2 mL/min (ref >60.00)
Glucose, Bld: 104 mg/dL — ABNORMAL HIGH (ref 70–99)
Potassium: 4 mEq/L (ref 3.5–5.1)
Sodium: 142 mEq/L (ref 135–145)
Total Bilirubin: 0.8 mg/dL (ref 0.2–1.2)
Total Protein: 7.4 g/dL (ref 6.0–8.3)

## 2022-02-02 LAB — HEMOGLOBIN A1C: Hgb A1c MFr Bld: 5.9 % (ref 4.6–6.5)

## 2022-02-02 LAB — TSH: TSH: 1.28 u[IU]/mL (ref 0.35–5.50)

## 2022-02-02 MED ORDER — CYANOCOBALAMIN 1000 MCG/ML IJ SOLN
1000.0000 ug | Freq: Once | INTRAMUSCULAR | Status: AC
  Administered 2022-02-02: 1000 ug via INTRAMUSCULAR

## 2022-02-02 MED ORDER — TORSEMIDE 10 MG PO TABS: 10.0000 mg | ORAL_TABLET | Freq: Every day | ORAL | 3 refills | Status: DC | PRN

## 2022-02-02 NOTE — Progress Notes (Signed)
Subjective:  Patient ID: Kathleen Mejia, female    DOB: Oct 31, 1933  Age: 88 y.o. MRN: [id number]  CC: Leg Swelling (Pt states she been having knee and leg pain & swelling- Need B12 inj)   HPI Kathleen Mejia presents for leg painful wart C/o edema - worse. F/u on HTN, dementia. Eating sweets and tangerines primarily Not taking her meds as directed  She is here w/her son and dtr  Outpatient Medications Prior to Visit  Medication Sig Dispense Refill   Ascorbic Acid (VITAMIN C) 1000 MG tablet Take 1,000 mg by mouth daily.      bumetanide (BUMEX) 0.5 MG tablet TAKE 1 TABLET AT BY MOUTH DAILY WITH BREAKFAST 90 tablet 3   Cod Liver Oil 1000 MG CAPS Take 1,000 mg by mouth daily.      Coenzyme Q10 (CO Q 10 PO) Take 250 mg by mouth daily.      CRANBERRY PO Take 1 capsule by mouth daily.      docusate sodium (COLACE) 100 MG capsule Take 1 capsule (100 mg total) by mouth 2 (two) times daily. 60 capsule 0   FLAXSEED, LINSEED, PO Take 0.5 Scoops by mouth daily. PATIENT SPRINKLE FLAX SEEDS ONTO HER CEREAL DAILY     Garlic 973 MG CAPS Take 500 mg by mouth daily.      Multiple Minerals (MULTI-MINERALS PO) Take 1 tablet by mouth daily.     polyethylene glycol (MIRALAX / GLYCOLAX) 17 g packet Take 17 g by mouth daily as needed for mild constipation. 14 each 0   potassium chloride (KLOR-CON) 10 MEQ tablet TAKE 1 TABLET(10 MEQ) BY MOUTH DAILY 90 tablet 3   simvastatin (ZOCOR) 5 MG tablet Take 1 tablet (5 mg total) by mouth at bedtime. 90 tablet 2   Zinc Sulfate (ZINC 15 PO) Take 50 mg by mouth daily.      donepezil (ARICEPT) 5 MG tablet TAKE 1 TABLET(5 MG) BY MOUTH AT BEDTIME (Patient not taking: Reported on 02/02/2022) 90 tablet 3   No facility-administered medications prior to visit.    ROS: Review of Systems  Constitutional:  Positive for fatigue. Negative for activity change, appetite change, chills and unexpected weight change.  HENT:  Negative for congestion, mouth sores and sinus  pressure.   Eyes:  Negative for visual disturbance.  Respiratory:  Negative for cough and chest tightness.   Cardiovascular:  Positive for leg swelling.  Gastrointestinal:  Negative for abdominal pain and nausea.  Genitourinary:  Negative for difficulty urinating, frequency and vaginal pain.  Musculoskeletal:  Positive for gait problem. Negative for back pain.  Skin:  Negative for pallor and rash.  Neurological:  Positive for light-headedness. Negative for dizziness, tremors, weakness, numbness and headaches.  Psychiatric/Behavioral:  Positive for confusion and decreased concentration. Negative for sleep disturbance. The patient is not nervous/anxious.     Objective:  BP 130/72 (BP Location: Left Arm)   Pulse 73   Temp 98 F (36.7 C) (Oral)   Ht '5\' 1"'$  (1.549 m)   Wt 171 lb 12.8 oz (77.9 kg)   SpO2 95%   BMI 32.46 kg/m   BP Readings from Last 3 Encounters:  02/02/22 130/72  08/17/21 110/80  05/05/21 110/80    Wt Readings from Last 3 Encounters:  02/02/22 171 lb 12.8 oz (77.9 kg)  08/17/21 166 lb (75.3 kg)  05/05/21 162 lb 9.6 oz (73.8 kg)    Physical Exam Constitutional:      General: She is not in acute  distress.    Appearance: She is well-developed. She is obese. She is not ill-appearing.  HENT:     Head: Normocephalic.     Right Ear: External ear normal.     Left Ear: External ear normal.     Nose: Nose normal.  Eyes:     General:        Right eye: No discharge.        Left eye: No discharge.     Conjunctiva/sclera: Conjunctivae normal.     Pupils: Pupils are equal, round, and reactive to light.  Neck:     Thyroid: No thyromegaly.     Vascular: No JVD.     Trachea: No tracheal deviation.  Cardiovascular:     Rate and Rhythm: Normal rate and regular rhythm.     Heart sounds: Normal heart sounds.  Pulmonary:     Effort: No respiratory distress.     Breath sounds: No stridor. No wheezing.  Abdominal:     General: Bowel sounds are normal. There is no  distension.     Palpations: Abdomen is soft. There is no mass.     Tenderness: There is no abdominal tenderness. There is no guarding or rebound.  Musculoskeletal:        General: No swelling or tenderness.     Cervical back: Normal range of motion and neck supple. No rigidity.     Right lower leg: Edema present.     Left lower leg: Edema present.  Lymphadenopathy:     Cervical: No cervical adenopathy.  Skin:    Findings: Lesion present. No erythema or rash.  Neurological:     Cranial Nerves: No cranial nerve deficit.     Motor: Weakness present. No abnormal muscle tone.     Gait: Gait abnormal.     Deep Tendon Reflexes: Reflexes normal.  Psychiatric:        Mood and Affect: Mood normal.        Behavior: Behavior normal.        Thought Content: Thought content normal.   B leg edema  3 mm mole above L knee  Wart on the RLE    Procedure Note :     Procedure : Cryosurgery   Indication:  Wart on the RLE   Risks including unsuccessful procedure , bleeding, infection, bruising, scar, a need for a repeat  procedure and others were explained to the patient in detail as well as the benefits. Informed consent was obtained verbally.     lesion(s)  on    was/were treated with liquid nitrogen on a Q-tip in a usual fasion . Band-Aid was applied and antibiotic ointment was given for a later use.   Tolerated well. Complications none.   Postprocedure instructions :     Keep the wounds clean. You can wash them with liquid soap and water. Pat dry with gauze or a Kleenex tissue  Before applying antibiotic ointment and a Band-Aid.   You need to report immediately  if  any signs of infection develop.   Lab Results  Component Value Date   WBC 8.1 02/02/2022   HGB 15.5 (H) 02/02/2022   HCT 46.3 (H) 02/02/2022   PLT 168.0 02/02/2022   GLUCOSE 104 (H) 02/02/2022   CHOL 148 02/17/2021   TRIG 82.0 02/17/2021   HDL 50.80 02/17/2021   LDLDIRECT 139.3 02/21/2008   LDLCALC 81 02/17/2021    ALT 18 02/02/2022   AST 20 02/02/2022   NA 142 02/02/2022  K 4.0 02/02/2022   CL 104 02/02/2022   CREATININE 0.70 02/02/2022   BUN 16 02/02/2022   CO2 30 02/02/2022   TSH 1.28 02/02/2022   HGBA1C 5.9 02/02/2022   MICROALBUR 0.4 06/25/2008    MM 3D SCREEN BREAST BILATERAL  Result Date: 01/25/2021 CLINICAL DATA:  Screening. EXAM: DIGITAL SCREENING BILATERAL MAMMOGRAM WITH TOMOSYNTHESIS AND CAD TECHNIQUE: Bilateral screening digital craniocaudal and mediolateral oblique mammograms were obtained. Bilateral screening digital breast tomosynthesis was performed. The images were evaluated with computer-aided detection. COMPARISON:  Previous exam(s). ACR Breast Density Category b: There are scattered areas of fibroglandular density. FINDINGS: There are no findings suspicious for malignancy. IMPRESSION: No mammographic evidence of malignancy. A result letter of this screening mammogram will be mailed directly to the patient. RECOMMENDATION: Screening mammogram in one year. (Code:SM-B-01Y) BI-RADS CATEGORY  1: Negative. Electronically Signed   By: Dorise Bullion III M.D.   On: 01/25/2021 11:28    Assessment & Plan:   Problem List Items Addressed This Visit     Edema   Relevant Orders   Comprehensive metabolic panel (Completed)   CBC with Differential/Platelet (Completed)   TSH (Completed)   Hemoglobin A1c (Completed)   Elevated glucose   Relevant Orders   Comprehensive metabolic panel (Completed)   Hemoglobin A1c (Completed)   Vitamin B12 deficiency - Primary   Wart    Cryo         Meds ordered this encounter  Medications   cyanocobalamin (VITAMIN B12) injection 1,000 mcg   torsemide (DEMADEX) 10 MG tablet    Sig: Take 1 tablet (10 mg total) by mouth daily as needed.    Dispense:  30 tablet    Refill:  3      Follow-up: Return in about 3 months (around 05/04/2022) for a follow-up visit.  Walker Kehr, MD

## 2022-02-02 NOTE — Patient Instructions (Signed)
   Postprocedure instructions :     Keep the wounds clean. You can wash them with liquid soap and water. Pat dry with gauze or a Kleenex tissue  Before applying antibiotic ointment and a Band-Aid.   You need to report immediately  if  any signs of infection develop.    

## 2022-02-02 NOTE — Assessment & Plan Note (Signed)
Cryo  

## 2022-02-15 ENCOUNTER — Telehealth: Payer: Self-pay | Admitting: Internal Medicine

## 2022-02-15 NOTE — Telephone Encounter (Signed)
LVM for pt to rtn my call to schedule AWV with [facility] call back # [phone] 

## 2022-02-27 NOTE — Progress Notes (Deleted)
88 y.o. G71P1002 Widowed White or Caucasian Not Hispanic or Latino female here for annual exam.      No LMP recorded. Patient has had a hysterectomy.          Sexually active: {yes no:314532}  The current method of family planning is {contraception:315051}.    Exercising: {yes no:314532}  {types:19826} Smoker:  {YES NO:22349}  Health Maintenance: Pap:  patient had total hysterectomy  History of abnormal Pap:  no TVH/LS/cystocele repair in 8/20.  Pathology with stage 1a, grade 1 endometrial adenocarcinoma. She was seen by Dr Alycia Rossetti in GYN Oncology who didn't feel she needed more surgery MMG:  01/25/21 BMD:   06/25/13 mild bone loss  Colonoscopy: unsure  TDaP:  patient refused  Gardasil: NA   reports that she has never smoked. She has never used smokeless tobacco. She reports that she does not drink alcohol and does not use drugs.  Past Medical History:  Diagnosis Date   Anxiety    Atrial fibrillation ([place])    Cystocele with prolapse    Dislocated shoulder 2012   right   Edema    left leg   Fatty liver    Gait disturbance    Gallstone    1.5 cm   Ganglion cyst    Glaucoma    HTN (hypertension)    Hyperlipidemia    LBP (low back pain)    Obesity    Osteoarthritis    Rosacea    Shortness of breath    with activities and exertion, pt denies 01/23/2019   Venous insufficiency of leg    Vertigo    not current 01/23/2019   Vitamin B 12 deficiency     Past Surgical History:  Procedure Laterality Date   ABDOMINAL HYSTERECTOMY     CHOLECYSTECTOMY N/A 03/27/2014   Procedure: LAPAROSCOPIC CHOLECYSTECTOMY WITH INTRAOPERATIVE CHOLANGIOGRAM;  Surgeon: Fanny Skates, MD;  Location: [facility];  Service: General;  Laterality: N/A;   CYSTOCELE REPAIR N/A 01/28/2019   Procedure: ANTERIOR REPAIR (CYSTOCELE);  Surgeon: [name], MD;  Location: [facility];  Service: Gynecology;  Laterality: N/A;   CYSTOSCOPY N/A 01/28/2019   Procedure: CYSTOSCOPY;  Surgeon: [name], MD;  Location: [facility];  Service: Gynecology;  Laterality: N/A;   TONSILLECTOMY     VAGINAL HYSTERECTOMY Left 01/28/2019   Procedure: HYSTERECTOMY VAGINAL WITH LEFT SALPINGECTOMY;  Surgeon: [name], MD;  Location: [facility]  Service: Gynecology;  Laterality: Left;    Current Outpatient Medications  Medication Sig Dispense Refill   Ascorbic Acid (VITAMIN C) 1000 MG tablet Take 1,000 mg by mouth daily.      bumetanide (BUMEX) 0.5 MG tablet TAKE 1 TABLET AT BY MOUTH DAILY WITH BREAKFAST 90 tablet 3   Cod Liver Oil 1000 MG CAPS Take 1,000 mg by mouth daily.      Coenzyme Q10 (CO Q 10 PO) Take 250 mg by mouth daily.      CRANBERRY PO Take 1 capsule by mouth daily.      docusate sodium (COLACE) 100 MG capsule Take 1 capsule (100 mg total) by mouth 2 (two) times daily. 60 capsule 0   donepezil (ARICEPT) 5 MG tablet TAKE 1 TABLET(5 MG) BY MOUTH AT BEDTIME (Patient not taking: Reported on 02/02/2022) 90 tablet 3   FLAXSEED, LINSEED, PO Take 0.5 Scoops by mouth daily. PATIENT SPRINKLE FLAX SEEDS ONTO HER CEREAL DAILY     Garlic 831 MG CAPS Take 500 mg by  mouth daily.      Multiple Minerals (MULTI-MINERALS PO) Take 1 tablet by mouth daily.     polyethylene glycol (MIRALAX / GLYCOLAX) 17 g packet Take 17 g by mouth daily as needed for mild constipation. 14 each 0   potassium chloride (KLOR-CON) 10 MEQ tablet TAKE 1 TABLET(10 MEQ) BY MOUTH DAILY 90 tablet 3   simvastatin (ZOCOR) 5 MG tablet Take 1 tablet (5 mg total) by mouth at bedtime. 90 tablet 2   torsemide (DEMADEX) 10 MG tablet Take 1 tablet (10 mg total) by mouth daily as needed. 30 tablet 3   Zinc Sulfate (ZINC 15 PO) Take 50 mg by mouth daily.      No current facility-administered medications for this visit.    Family History  Problem Relation Age of Onset   Pancreatic cancer Mother    Hypertension Mother    Breast cancer Mother    Kidney failure Sister     Review of  Systems  Exam:   There were no vitals taken for this visit.  Weight change: '@WEIGHTCHANGE'$ @ Height:      Ht Readings from Last 3 Encounters:  02/02/22 '5\' 1"'$  (1.549 m)  08/17/21 '5\' 1"'$  (1.549 m)  02/17/21 '5\' 1"'$  (1.549 m)    General appearance: alert, cooperative and appears stated age Head: Normocephalic, without obvious abnormality, atraumatic Neck: no adenopathy, supple, symmetrical, trachea midline and thyroid {CHL AMB PHY EX THYROID NORM DEFAULT:[phone]::"normal to inspection and palpation"} Lungs: clear to auscultation bilaterally Cardiovascular: regular rate and rhythm Breasts: {Exam; breast:13139::"normal appearance, no masses or tenderness"} Abdomen: soft, non-tender; non distended,  no masses,  no organomegaly Extremities: extremities normal, atraumatic, no cyanosis or edema Skin: Skin color, texture, turgor normal. No rashes or lesions Lymph nodes: Cervical, supraclavicular, and axillary nodes normal. No abnormal inguinal nodes palpated Neurologic: Grossly normal   Pelvic: External genitalia:  no lesions              Urethra:  normal appearing urethra with no masses, tenderness or lesions              Bartholins and Skenes: normal                 Vagina: normal appearing vagina with normal color and discharge, no lesions              Cervix: {CHL AMB PHY EX CERVIX NORM DEFAULT:[phone]::"no lesions"}               Bimanual Exam:  Uterus:  {CHL AMB PHY EX UTERUS NORM [phone]::"normal size, contour, position, consistency, mobility, non-tender"}              Adnexa: {CHL AMB PHY EX ADNEXA NO MASS DEFAULT:[phone]::"no mass, fullness, tenderness"}               Rectovaginal: Confirms               Anus:  normal sphincter tone, no lesions  *** chaperoned for the exam.  A:  Well Woman with normal exam  P:

## 2022-02-28 ENCOUNTER — Telehealth: Payer: Self-pay | Admitting: Internal Medicine

## 2022-02-28 NOTE — Telephone Encounter (Signed)
LVM for son to rtn my call to reschedule AWV with [facility], call back # [phone]

## 2022-03-02 ENCOUNTER — Ambulatory Visit: Payer: Medicare Other | Admitting: Obstetrics and Gynecology

## 2022-03-03 ENCOUNTER — Ambulatory Visit: Payer: Medicare Other

## 2022-03-17 ENCOUNTER — Ambulatory Visit: Payer: Medicare Other

## 2022-03-31 ENCOUNTER — Ambulatory Visit: Payer: Medicare Other

## 2022-04-03 ENCOUNTER — Ambulatory Visit: Payer: Medicare Other

## 2022-04-07 ENCOUNTER — Ambulatory Visit (INDEPENDENT_AMBULATORY_CARE_PROVIDER_SITE_OTHER): Payer: Medicare Other

## 2022-04-07 DIAGNOSIS — E538 Deficiency of other specified B group vitamins: Secondary | ICD-10-CM

## 2022-04-07 MED ORDER — CYANOCOBALAMIN 1000 MCG/ML IJ SOLN
1000.0000 ug | Freq: Once | INTRAMUSCULAR | Status: AC
  Administered 2022-04-07: 1000 ug via INTRAMUSCULAR

## 2022-04-07 NOTE — Progress Notes (Signed)
B12 given.  Pt tolerated well. Pt is aware to give the office a call for an side effects or reactions. Please co-sign.   

## 2022-04-13 ENCOUNTER — Ambulatory Visit: Payer: Medicare Other

## 2022-04-14 ENCOUNTER — Ambulatory Visit (INDEPENDENT_AMBULATORY_CARE_PROVIDER_SITE_OTHER): Payer: Medicare Other

## 2022-04-14 ENCOUNTER — Telehealth: Payer: Self-pay

## 2022-04-14 VITALS — Ht 61.0 in

## 2022-04-14 DIAGNOSIS — Z0000 Encounter for general adult medical examination without abnormal findings: Secondary | ICD-10-CM | POA: Diagnosis not present

## 2022-04-14 MED ORDER — KETOCONAZOLE 2 % EX CREA: 1.0000 | TOPICAL_CREAM | Freq: Every day | CUTANEOUS | 1 refills | Status: DC

## 2022-04-14 NOTE — Telephone Encounter (Signed)
Ok to try [employer] cr - done erx

## 2022-04-14 NOTE — Patient Instructions (Signed)
[name] , Thank you for taking time to come for your Medicare Wellness Visit. I appreciate your ongoing commitment to your health goals. Please review the following plan we discussed and let me know if I can assist you in the future.   These are the goals we discussed:  Goals      Client understands the importance of follow-up with providers by attending scheduled visits.        This is a list of the screening recommended for you and due dates:  Health Maintenance  Topic Date Due   Tetanus Vaccine  Never done   Zoster (Shingles) Vaccine (1 of 2) Never done   COVID-19 Vaccine (4 - Pfizer risk series) 07/03/2020   Medicare Annual Wellness Visit  04/15/2023   Pneumonia Vaccine  Completed   DEXA scan (bone density measurement)  Completed   HPV Vaccine  Aged Out   Flu Shot  Discontinued    Advanced directives: No  Conditions/risks identified: Yes  Next appointment: Follow up in one year for your annual wellness visit.   Preventive Care 22 Years and Older, Female Preventive care refers to lifestyle choices and visits with your health care provider that can promote health and wellness. What does preventive care include? A yearly physical exam. This is also called an annual well check. Dental exams once or twice a year. Routine eye exams. Ask your health care provider how often you should have your eyes checked. Personal lifestyle choices, including: Daily care of your teeth and gums. Regular physical activity. Eating a healthy diet. Avoiding tobacco and drug use. Limiting alcohol use. Practicing safe sex. Taking low-dose aspirin every day. Taking vitamin and mineral supplements as recommended by your health care provider. What happens during an annual well check? The services and screenings done by your health care provider during your annual well check will depend on your age, overall health, lifestyle risk factors, and family history of disease. Counseling  Your health  care provider may ask you questions about your: Alcohol use. Tobacco use. Drug use. Emotional well-being. Home and relationship well-being. Sexual activity. Eating habits. History of falls. Memory and ability to understand (cognition). Work and work Statistician. Reproductive health. Screening  You may have the following tests or measurements: Height, weight, and BMI. Blood pressure. Lipid and cholesterol levels. These may be checked every 5 years, or more frequently if you are over 60 years old. Skin check. Lung cancer screening. You may have this screening every year starting at age 82 if you have a 30-pack-year history of smoking and currently smoke or have quit within the past 15 years. Fecal occult blood test (FOBT) of the stool. You may have this test every year starting at age 30. Flexible sigmoidoscopy or colonoscopy. You may have a sigmoidoscopy every 5 years or a colonoscopy every 10 years starting at age 91. Hepatitis C blood test. Hepatitis B blood test. Sexually transmitted disease (STD) testing. Diabetes screening. This is done by checking your blood sugar (glucose) after you have not eaten for a while (fasting). You may have this done every 1-3 years. Bone density scan. This is done to screen for osteoporosis. You may have this done starting at age 21. Mammogram. This may be done every 1-2 years. Talk to your health care provider about how often you should have regular mammograms. Talk with your health care provider about your test results, treatment options, and if necessary, the need for more tests. Vaccines  Your health care provider may  recommend certain vaccines, such as: Influenza vaccine. This is recommended every year. Tetanus, diphtheria, and acellular pertussis (Tdap, Td) vaccine. You may need a Td booster every 10 years. Zoster vaccine. You may need this after age 46. Pneumococcal 13-valent conjugate (PCV13) vaccine. One dose is recommended after age  51. Pneumococcal polysaccharide (PPSV23) vaccine. One dose is recommended after age 65. Talk to your health care provider about which screenings and vaccines you need and how often you need them. This information is not intended to replace advice given to you by your health care provider. Make sure you discuss any questions you have with your health care provider. Document Released: 06/18/2015 Document Revised: 02/09/2016 Document Reviewed: 03/23/2015 Elsevier Interactive Patient Education  2017 [place] Prevention in the Home Falls can cause injuries. They can happen to people of all ages. There are many things you can do to make your home safe and to help prevent falls. What can I do on the outside of my home? Regularly fix the edges of walkways and driveways and fix any cracks. Remove anything that might make you trip as you walk through a door, such as a raised step or threshold. Trim any bushes or trees on the path to your home. Use bright outdoor lighting. Clear any walking paths of anything that might make someone trip, such as rocks or tools. Regularly check to see if handrails are loose or broken. Make sure that both sides of any steps have handrails. Any raised decks and porches should have guardrails on the edges. Have any leaves, snow, or ice cleared regularly. Use sand or salt on walking paths during winter. Clean up any spills in your garage right away. This includes oil or grease spills. What can I do in the bathroom? Use night lights. Install grab bars by the toilet and in the tub and shower. Do not use towel bars as grab bars. Use non-skid mats or decals in the tub or shower. If you need to sit down in the shower, use a plastic, non-slip stool. Keep the floor dry. Clean up any water that spills on the floor as soon as it happens. Remove soap buildup in the tub or shower regularly. Attach bath mats securely with double-sided non-slip rug tape. Do not have throw  rugs and other things on the floor that can make you trip. What can I do in the bedroom? Use night lights. Make sure that you have a light by your bed that is easy to reach. Do not use any sheets or blankets that are too big for your bed. They should not hang down onto the floor. Have a firm chair that has side arms. You can use this for support while you get dressed. Do not have throw rugs and other things on the floor that can make you trip. What can I do in the kitchen? Clean up any spills right away. Avoid walking on wet floors. Keep items that you use a lot in easy-to-reach places. If you need to reach something above you, use a strong step stool that has a grab bar. Keep electrical cords out of the way. Do not use floor polish or wax that makes floors slippery. If you must use wax, use non-skid floor wax. Do not have throw rugs and other things on the floor that can make you trip. What can I do with my stairs? Do not leave any items on the stairs. Make sure that there are handrails on both sides of  the stairs and use them. Fix handrails that are broken or loose. Make sure that handrails are as long as the stairways. Check any carpeting to make sure that it is firmly attached to the stairs. Fix any carpet that is loose or worn. Avoid having throw rugs at the top or bottom of the stairs. If you do have throw rugs, attach them to the floor with carpet tape. Make sure that you have a light switch at the top of the stairs and the bottom of the stairs. If you do not have them, ask someone to add them for you. What else can I do to help prevent falls? Wear shoes that: Do not have high heels. Have rubber bottoms. Are comfortable and fit you well. Are closed at the toe. Do not wear sandals. If you use a stepladder: Make sure that it is fully opened. Do not climb a closed stepladder. Make sure that both sides of the stepladder are locked into place. Ask someone to hold it for you, if  possible. Clearly mark and make sure that you can see: Any grab bars or handrails. First and last steps. Where the edge of each step is. Use tools that help you move around (mobility aids) if they are needed. These include: Canes. Walkers. Scooters. Crutches. Turn on the lights when you go into a dark area. Replace any light bulbs as soon as they burn out. Set up your furniture so you have a clear path. Avoid moving your furniture around. If any of your floors are uneven, fix them. If there are any pets around you, be aware of where they are. Review your medicines with your doctor. Some medicines can make you feel dizzy. This can increase your chance of falling. Ask your doctor what other things that you can do to help prevent falls. This information is not intended to replace advice given to you by your health care provider. Make sure you discuss any questions you have with your health care provider. Document Released: 03/18/2009 Document Revised: 10/28/2015 Document Reviewed: 06/26/2014 Elsevier Interactive Patient Education  2017 [employer].

## 2022-04-14 NOTE — Telephone Encounter (Signed)
This is a Kathleen Mejia patient c/o irritated, redness area under her left breast.  This has been going on for several weeks. Since Kathleen Mejia is out of the office today, can anything be sent to [facility] located on [address] near [place].   Mignon Pine, LPN

## 2022-04-14 NOTE — Progress Notes (Addendum)
Virtual Visit via Telephone Note  I connected with  Kathleen Mejia on 04/14/22 at  2:30 PM EST by telephone and verified that I am speaking with the correct person using two identifiers.  Location: Patient: Home with son [name] Provider: [place] Persons participating in the virtual visit: [place]   I discussed the limitations, risks, security and privacy concerns of performing an evaluation and management service by telephone and the availability of in person appointments. The patient expressed understanding and agreed to proceed.  Interactive audio and video telecommunications were attempted between this nurse and patient, however failed, due to patient having technical difficulties OR patient did not have access to video capability.  We continued and completed visit with audio only.  Some vital signs may be absent or patient reported.   Kathleen Flow, LPN  Subjective:   Kathleen Mejia is a 88 y.o. female who presents for Medicare Annual (Subsequent) preventive examination.  Review of Systems     Cardiac Risk Factors include: advanced age (>36mn, >>28women);dyslipidemia;family history of premature cardiovascular disease;hypertension;sedentary lifestyle     Objective:    Today's Vitals   04/14/22 1505  Height: '5\' 1"'$  (1.549 m)   Body mass index is 32.46 kg/m.     04/14/2022    2:38 PM 02/17/2021    3:37 PM 06/14/2020   12:53 PM 02/12/2019    4:21 PM 02/04/2019    2:50 PM 01/28/2019    9:34 AM 01/23/2019   12:19 PM  Advanced Directives  Does Patient Have a Medical Advance Directive? No Yes Yes Yes Yes Yes No  Type of Advance Directive  Living will;Healthcare Power of [place]  [place] will   Does patient want to make changes to medical advance directive?  No - Patient declined       Copy of [place]  Chart?  No - copy requested    No - copy requested   Would patient like information on creating a medical advance directive? No - Patient declined          Current Medications (verified) Outpatient Encounter Medications as of 04/14/2022  Medication Sig   Ascorbic Acid (VITAMIN C) 1000 MG tablet Take 1,000 mg by mouth daily.    aspirin EC 81 MG tablet Take 81 mg by mouth daily. Swallow whole.   bumetanide (BUMEX) 0.5 MG tablet TAKE 1 TABLET AT BY MOUTH DAILY WITH BREAKFAST   Cod Liver Oil 1000 MG CAPS Take 1,000 mg by mouth daily.    Coenzyme Q10 (CO Q 10 PO) Take 250 mg by mouth daily.    CRANBERRY PO Take 1 capsule by mouth daily.    docusate sodium (COLACE) 100 MG capsule Take 1 capsule (100 mg total) by mouth 2 (two) times daily.   FLAXSEED, LINSEED, PO Take 0.5 Scoops by mouth daily. PATIENT SPRINKLE FLAX SEEDS ONTO HER CEREAL DAILY   Garlic 5664MG CAPS Take 500 mg by mouth daily.    Multiple Minerals (MULTI-MINERALS PO) Take 1 tablet by mouth daily.   polyethylene glycol (MIRALAX / GLYCOLAX) 17 g packet Take 17 g by mouth daily as needed for mild constipation.   potassium chloride (KLOR-CON) 10 MEQ tablet TAKE 1 TABLET(10 MEQ) BY MOUTH DAILY   simvastatin (ZOCOR) 5 MG tablet Take 1 tablet (5 mg total) by mouth at bedtime.   torsemide (DEMADEX) 10 MG tablet Take 1 tablet (  10 mg total) by mouth daily as needed.   Zinc Sulfate (ZINC 15 PO) Take 50 mg by mouth daily.    donepezil (ARICEPT) 5 MG tablet TAKE 1 TABLET(5 MG) BY MOUTH AT BEDTIME (Patient not taking: Reported on 02/02/2022)   No facility-administered encounter medications on file as of 04/14/2022.    Allergies (verified) Eliquis [apixaban], Losartan, Maxzide [hydrochlorothiazide w-triamterene], and Metoprolol   History: Past Medical History:  Diagnosis Date   Anxiety    Atrial fibrillation ([place])    Cystocele with prolapse    Dislocated shoulder 2012   right   Edema    left leg   Fatty liver    Gait disturbance     Gallstone    1.5 cm   Ganglion cyst    Glaucoma    HTN (hypertension)    Hyperlipidemia    LBP (low back pain)    Obesity    Osteoarthritis    Rosacea    Shortness of breath    with activities and exertion, pt denies 01/23/2019   Venous insufficiency of leg    Vertigo    not current 01/23/2019   Vitamin B 12 deficiency    Past Surgical History:  Procedure Laterality Date   ABDOMINAL HYSTERECTOMY     CHOLECYSTECTOMY N/A 03/27/2014   Procedure: LAPAROSCOPIC CHOLECYSTECTOMY WITH INTRAOPERATIVE CHOLANGIOGRAM;  Surgeon: Fanny Skates, MD;  Location: [place];  Service: General;  Laterality: N/A;   CYSTOCELE REPAIR N/A 01/28/2019   Procedure: ANTERIOR REPAIR (CYSTOCELE);  Surgeon: [name], MD;  Location: [facility];  Service: Gynecology;  Laterality: N/A;   CYSTOSCOPY N/A 01/28/2019   Procedure: CYSTOSCOPY;  Surgeon: [name], MD;  [facility];  Service: Gynecology;  Laterality: N/A;   TONSILLECTOMY     VAGINAL HYSTERECTOMY Left 01/28/2019   Procedure: HYSTERECTOMY VAGINAL WITH LEFT SALPINGECTOMY;  Surgeon: [name], MD;  Location: [facility];  Service: Gynecology;  Laterality: Left;   Family History  Problem Relation Age of Onset   Pancreatic cancer Mother    Hypertension Mother    Breast cancer Mother    Kidney failure Sister    Social History   Socioeconomic History   Marital status: Widowed    Spouse name: Not on file   Number of children: Not on file   Years of education: Not on file   Highest education level: Not on file  Occupational History   Occupation: Retired  Tobacco Use   Smoking status: Never   Smokeless tobacco: Never  Vaping Use   Vaping Use: Never used  Substance and Sexual Activity   Alcohol use: No   Drug use: Never   Sexual activity: Not Currently    Birth control/protection: Post-menopausal  Other Topics Concern   Not on file  Social History Narrative   Not  taking vaccines         Social Determinants of Health   Financial Resource Strain: Low Risk  (04/14/2022)   Overall Financial Resource Strain (CARDIA)    Difficulty of Paying Living Expenses: Not hard at all  Food Insecurity: No Food Insecurity (04/14/2022)   Hunger Vital Sign    Worried About Running Out of Food in the Last Year: Never true    [place] in the Last Year: Never true  Transportation Needs: No Transportation Needs (04/14/2022)   PRAPARE - Hydrologist (Medical): No    Lack of Transportation (Non-Medical): No  Physical Activity: Inactive (04/14/2022)   Exercise Vital Sign    Days of Exercise per Week: 0 days    Minutes of Exercise per Session: 0 min  Stress: No Stress Concern Present (04/14/2022)   [place]    Feeling of Stress : Not at all  Social Connections: Moderately Integrated (04/14/2022)   Social Connection and Isolation Panel [NHANES]    Frequency of Communication with Friends and Family: More than three times a week    Frequency of Social Gatherings with Friends and Family: More than three times a week    Attends Religious Services: More than 4 times per year    Active Member of [employer] or Organizations: Yes    Attends Archivist Meetings: More than 4 times per year    Marital Status: Widowed    Tobacco Counseling Counseling given: Not Answered   Clinical Intake:  Pre-visit preparation completed: Yes        Nutritional Risks: Unintentional weight loss Diabetes: No  How often do you need to have someone help you when you read instructions, pamphlets, or other written materials from your doctor or pharmacy?: 1 - Never What is the last grade level you completed in school?: HSG  Diabetic? no  Interpreter Needed?: No  Information entered by :: Lisette Abu, LPN.   Activities of Daily Living    04/14/2022    3:13 PM  In your  present state of health, do you have any difficulty performing the following activities:  Hearing? 0  Vision? 0  Difficulty concentrating or making decisions? 1  Walking or climbing stairs? 1  Dressing or bathing? 0  Doing errands, shopping? 1  Preparing Food and eating ? Y  Using the Toilet? N  In the past six months, have you accidently leaked urine? Y  Do you have problems with loss of bowel control? N  Managing your Medications? Y  Managing your Finances? Y  Housekeeping or managing your Housekeeping? Y    Patient Care Team: Plotnikov, Evie Lacks, MD as PCP - General Darleen Crocker, MD as Consulting Physician (Ophthalmology) Christain Sacramento, OD as Referring Physician (Optometry)  Indicate any recent Medical Services you may have received from other than [facility] providers in the past year (date may be approximate).     Assessment:   This is a routine wellness examination for Kathleen Mejia.  Hearing/Vision screen Hearing Screening - Comments:: Denies hearing difficulties   Vision Screening - Comments:: Wears rx glasses - up to date with routine eye exams with Christain Sacramento, OD with [facility]   Dietary issues and exercise activities discussed: Current Exercise Habits: The patient does not participate in regular exercise at present, Exercise limited by: cardiac condition(s);orthopedic condition(s)   Goals Addressed             This Visit's Progress    Client understands the importance of follow-up with providers by attending scheduled visits.        Depression Screen    04/14/2022    2:43 PM 02/17/2021    2:54 PM 05/04/2020    2:46 PM 04/24/2019    3:34 PM 02/18/2018    4:14 PM 02/01/2017    3:58 PM 01/10/2016    9:25 AM  PHQ 2/9 Scores  PHQ - 2 Score 6 0 0 0 0 0 0  PHQ- 9 Score 18          Fall Risk    04/14/2022    3:13  PM 02/17/2021    2:54 PM 06/14/2020   12:51 PM 05/04/2020    2:45 PM 02/18/2018    4:14 PM  Fall Risk   Falls in the past year? 0 0 0 0 No  Number  falls in past yr: 0 0 0 0   Injury with Fall? 0 0 0 0   Risk for fall due to : No Fall Risks No Fall Risks     Follow up Falls prevention discussed        FALL RISK PREVENTION PERTAINING TO THE HOME:  Any stairs in or around the home? No  If so, are there any without handrails? No  Home free of loose throw rugs in walkways, pet beds, electrical cords, etc? Yes  Adequate lighting in your home to reduce risk of falls? Yes   ASSISTIVE DEVICES UTILIZED TO PREVENT FALLS:  Life alert? No  Use of a cane, walker or w/c? Yes  Grab bars in the bathroom? Yes  Shower chair or bench in shower? Yes  Elevated toilet seat or a handicapped toilet? No   TIMED UP AND GO:  Was the test performed? No . Phone Visit   Cognitive Function:    04/14/2022    3:15 PM  MMSE - Mini Mental State Exam  Not completed: Refused;Unable to complete        04/14/2022    3:14 PM  6CIT Screen  What Year? 0 points  What month? 0 points  What time? 0 points  Count back from 20 0 points  Months in reverse 0 points  Repeat phrase 0 points  Total Score 0 points    Immunizations Immunization History  Administered Date(s) Administered   PFIZER(Purple Top)SARS-COV-2 Vaccination 08/10/2019, 09/09/2019, 05/08/2020   PNEUMOCOCCAL CONJUGATE-20 08/17/2021    TDAP status: Due, Education has been provided regarding the importance of this vaccine. Advised may receive this vaccine at local pharmacy or Health Dept. Aware to provide a copy of the vaccination record if obtained from local pharmacy or Health Dept. Verbalized acceptance and understanding.  Flu Vaccine status: Declined, Education has been provided regarding the importance of this vaccine but patient still declined. Advised may receive this vaccine at local pharmacy or Health Dept. Aware to provide a copy of the vaccination record if obtained from local pharmacy or Health Dept. Verbalized acceptance and understanding.  Pneumococcal vaccine status: Up to  date  Covid-19 vaccine status: Completed vaccines  Qualifies for Shingles Vaccine? Yes   Zostavax completed No   Shingrix Completed?: No.    Education has been provided regarding the importance of this vaccine. Patient has been advised to call insurance company to determine out of pocket expense if they have not yet received this vaccine. Advised may also receive vaccine at local pharmacy or Health Dept. Verbalized acceptance and understanding.  Screening Tests Health Maintenance  Topic Date Due   TETANUS/TDAP  Never done   Zoster Vaccines- Shingrix (1 of 2) Never done   COVID-19 Vaccine (4 - Pfizer risk series) 07/03/2020   Medicare Annual Wellness (AWV)  04/15/2023   Pneumonia Vaccine 13+ Years old  Completed   DEXA SCAN  Completed   HPV VACCINES  Aged Out   INFLUENZA VACCINE  Discontinued    Health Maintenance  Health Maintenance Due  Topic Date Due   TETANUS/TDAP  Never done   Zoster Vaccines- Shingrix (1 of 2) Never done   COVID-19 Vaccine (4 - Pfizer risk series) 07/03/2020    Colorectal cancer screening: No  longer required.   Mammogram status: Completed 01/21/2021. Repeat every year  Bone Density status: No longer required.  Lung Cancer Screening: (Low Dose CT Chest recommended if Age 29-80 years, 30 pack-year currently smoking OR have quit w/in 15years.) does not qualify.   Lung Cancer Screening Referral: no  Additional Screening:  Hepatitis C Screening: does not qualify; Completed no  Vision Screening: Recommended annual ophthalmology exams for early detection of glaucoma and other disorders of the eye. Is the patient up to date with their annual eye exam?  Yes  Who is the provider or what is the name of the office in which the patient attends annual eye exams? Staci Palmer, OD. If pt is not established with a provider, would they like to be referred to a provider to establish care? No .   Dental Screening: Recommended annual dental exams for proper oral  hygiene  Community Resource Referral / Chronic Care Management: CRR required this visit?  No   CCM required this visit?  No      Plan:     I have personally reviewed and noted the following in the patient's chart:   Medical and social history Use of alcohol, tobacco or illicit drugs  Current medications and supplements including opioid prescriptions. Patient is not currently taking opioid prescriptions. Functional ability and status Nutritional status Physical activity Advanced directives List of other physicians Hospitalizations, surgeries, and ER visits in previous 12 months Vitals Screenings to include cognitive, depression, and falls Referrals and appointments  In addition, I have reviewed and discussed with patient certain preventive protocols, quality metrics, and best practice recommendations. A written personalized care plan for preventive services as well as general preventive health recommendations were provided to patient.     Kathleen Flow, LPN   [id number]   Nurse Notes: n/a

## 2022-04-14 NOTE — Telephone Encounter (Signed)
Ok  - see below

## 2022-04-17 NOTE — Progress Notes (Deleted)
88 y.o. G76P1002 Widowed White or Caucasian Not Hispanic or Latino female here for annual exam.      No LMP recorded. Patient has had a hysterectomy.          Sexually active: {yes no:314532}  The current method of family planning is {contraception:315051}.    Exercising: {yes no:314532}  {types:19826} Smoker:  {YES NO:22349}  Health Maintenance: Pap:   patient had total hysterectomy  History of abnormal Pap:  no MMG:  01/21/22 density B Bi-rads 1 neg  BMD:   06/25/13 mild bone loss  Colonoscopy: unsure  TDaP:  patient refused  Gardasil: NA   reports that she has never smoked. She has never used smokeless tobacco. She reports that she does not drink alcohol and does not use drugs.  Past Medical History:  Diagnosis Date   Anxiety    Atrial fibrillation ([place])    Cystocele with prolapse    Dislocated shoulder 2012   right   Edema    left leg   Fatty liver    Gait disturbance    Gallstone    1.5 cm   Ganglion cyst    Glaucoma    HTN (hypertension)    Hyperlipidemia    LBP (low back pain)    Obesity    Osteoarthritis    Rosacea    Shortness of breath    with activities and exertion, pt denies 01/23/2019   Venous insufficiency of leg    Vertigo    not current 01/23/2019   Vitamin B 12 deficiency     Past Surgical History:  Procedure Laterality Date   ABDOMINAL HYSTERECTOMY     CHOLECYSTECTOMY N/A 03/27/2014   Procedure: LAPAROSCOPIC CHOLECYSTECTOMY WITH INTRAOPERATIVE CHOLANGIOGRAM;  Surgeon: Fanny Skates, MD;  Location: [place];  Service: General;  Laterality: N/A;   CYSTOCELE REPAIR N/A 01/28/2019   Procedure: ANTERIOR REPAIR (CYSTOCELE);  Surgeon: [name], MD;  Location: [facility];  Service: Gynecology;  Laterality: N/A;   CYSTOSCOPY N/A 01/28/2019   Procedure: CYSTOSCOPY;  Surgeon: [name], MD;  Location[facility];  Service: Gynecology;  Laterality: N/A;   TONSILLECTOMY     VAGINAL HYSTERECTOMY Left  01/28/2019   Procedure: HYSTERECTOMY VAGINAL WITH LEFT SALPINGECTOMY;  Surgeon: [name], MD;  [facility];  Service: Gynecology;  Laterality: Left;    Current Outpatient Medications  Medication Sig Dispense Refill   Ascorbic Acid (VITAMIN C) 1000 MG tablet Take 1,000 mg by mouth daily.      aspirin EC 81 MG tablet Take 81 mg by mouth daily. Swallow whole.     bumetanide (BUMEX) 0.5 MG tablet TAKE 1 TABLET AT BY MOUTH DAILY WITH BREAKFAST 90 tablet 3   Cod Liver Oil 1000 MG CAPS Take 1,000 mg by mouth daily.      Coenzyme Q10 (CO Q 10 PO) Take 250 mg by mouth daily.      CRANBERRY PO Take 1 capsule by mouth daily.      docusate sodium (COLACE) 100 MG capsule Take 1 capsule (100 mg total) by mouth 2 (two) times daily. 60 capsule 0   donepezil (ARICEPT) 5 MG tablet TAKE 1 TABLET(5 MG) BY MOUTH AT BEDTIME (Patient not taking: Reported on 02/02/2022) 90 tablet 3   FLAXSEED, LINSEED, PO Take 0.5 Scoops by mouth daily. PATIENT SPRINKLE FLAX SEEDS ONTO HER CEREAL DAILY     Garlic 387 MG CAPS Take 500 mg by mouth daily.  ketoconazole (NIZORAL) 2 % cream Apply 1 Application topically daily. 15 g 1   Multiple Minerals (MULTI-MINERALS PO) Take 1 tablet by mouth daily.     polyethylene glycol (MIRALAX / GLYCOLAX) 17 g packet Take 17 g by mouth daily as needed for mild constipation. 14 each 0   potassium chloride (KLOR-CON) 10 MEQ tablet TAKE 1 TABLET(10 MEQ) BY MOUTH DAILY 90 tablet 3   simvastatin (ZOCOR) 5 MG tablet Take 1 tablet (5 mg total) by mouth at bedtime. 90 tablet 2   torsemide (DEMADEX) 10 MG tablet Take 1 tablet (10 mg total) by mouth daily as needed. 30 tablet 3   Zinc Sulfate (ZINC 15 PO) Take 50 mg by mouth daily.      No current facility-administered medications for this visit.    Family History  Problem Relation Age of Onset   Pancreatic cancer Mother    Hypertension Mother    Breast cancer Mother    Kidney failure Sister     Review of  Systems  Exam:   There were no vitals taken for this visit.  Weight change: '@WEIGHTCHANGE'$ @ Height:      Ht Readings from Last 3 Encounters:  04/14/22 '5\' 1"'$  (1.549 m)  02/02/22 '5\' 1"'$  (1.549 m)  08/17/21 '5\' 1"'$  (1.549 m)    General appearance: alert, cooperative and appears stated age Head: Normocephalic, without obvious abnormality, atraumatic Neck: no adenopathy, supple, symmetrical, trachea midline and thyroid {CHL AMB PHY EX THYROID NORM DEFAULT:[phone]::"normal to inspection and palpation"} Lungs: clear to auscultation bilaterally Cardiovascular: regular rate and rhythm Breasts: {Exam; breast:13139::"normal appearance, no masses or tenderness"} Abdomen: soft, non-tender; non distended,  no masses,  no organomegaly Extremities: extremities normal, atraumatic, no cyanosis or edema Skin: Skin color, texture, turgor normal. No rashes or lesions Lymph nodes: Cervical, supraclavicular, and axillary nodes normal. No abnormal inguinal nodes palpated Neurologic: Grossly normal   Pelvic: External genitalia:  no lesions              Urethra:  normal appearing urethra with no masses, tenderness or lesions              Bartholins and Skenes: normal                 Vagina: normal appearing vagina with normal color and discharge, no lesions              Cervix: {CHL AMB PHY EX CERVIX NORM DEFAULT:[id number]::"no lesions"}               Bimanual Exam:  Uterus:  {CHL AMB PHY EX UTERUS NORM DEFAULT:[phone]::"normal size, contour, position, consistency, mobility, non-tender"}              Adnexa: {CHL AMB PHY EX ADNEXA NO MASS DEFAULT:[phone]::"no mass, fullness, tenderness"}               Rectovaginal: Confirms               Anus:  normal sphincter tone, no lesions  *** chaperoned for the exam.  A:  Well Woman with normal exam  P:

## 2022-04-20 ENCOUNTER — Ambulatory Visit: Payer: Medicare Other | Admitting: Obstetrics and Gynecology

## 2022-05-04 ENCOUNTER — Ambulatory Visit (INDEPENDENT_AMBULATORY_CARE_PROVIDER_SITE_OTHER): Payer: Medicare Other | Admitting: Internal Medicine

## 2022-05-04 ENCOUNTER — Encounter: Payer: Self-pay | Admitting: Internal Medicine

## 2022-05-04 VITALS — BP 126/74 | HR 96 | Temp 97.5°F | Ht 61.0 in | Wt 164.0 lb

## 2022-05-04 DIAGNOSIS — I10 Essential (primary) hypertension: Secondary | ICD-10-CM

## 2022-05-04 DIAGNOSIS — G309 Alzheimer's disease, unspecified: Secondary | ICD-10-CM

## 2022-05-04 DIAGNOSIS — L84 Corns and callosities: Secondary | ICD-10-CM

## 2022-05-04 DIAGNOSIS — I480 Paroxysmal atrial fibrillation: Secondary | ICD-10-CM | POA: Diagnosis not present

## 2022-05-04 DIAGNOSIS — R7309 Other abnormal glucose: Secondary | ICD-10-CM | POA: Diagnosis not present

## 2022-05-04 DIAGNOSIS — F0280 Dementia in other diseases classified elsewhere without behavioral disturbance: Secondary | ICD-10-CM

## 2022-05-04 NOTE — Assessment & Plan Note (Signed)
Chronic Bumex qam

## 2022-05-04 NOTE — Progress Notes (Signed)
Subjective:  Patient ID: Kathleen Mejia, female    DOB: [birth year]  Age: 88 y.o. MRN: [id number]  CC: Follow-up (Becoming weak in the legs and needs left foot checked )   HPI Tasfia Vasseur Wigal presents for hypertension, dementia, leg edema She is complaining of pain in her feet due to toenail deformities and hard calluses  Outpatient Medications Prior to Visit  Medication Sig Dispense Refill   Ascorbic Acid (VITAMIN C) 1000 MG tablet Take 1,000 mg by mouth daily.      aspirin EC 81 MG tablet Take 81 mg by mouth daily. Swallow whole.     bumetanide (BUMEX) 0.5 MG tablet TAKE 1 TABLET AT BY MOUTH DAILY WITH BREAKFAST 90 tablet 3   Cod Liver Oil 1000 MG CAPS Take 1,000 mg by mouth daily.      Coenzyme Q10 (CO Q 10 PO) Take 250 mg by mouth daily.      CRANBERRY PO Take 1 capsule by mouth daily.      docusate sodium (COLACE) 100 MG capsule Take 1 capsule (100 mg total) by mouth 2 (two) times daily. 60 capsule 0   donepezil (ARICEPT) 5 MG tablet TAKE 1 TABLET(5 MG) BY MOUTH AT BEDTIME 90 tablet 3   FLAXSEED, LINSEED, PO Take 0.5 Scoops by mouth daily. PATIENT SPRINKLE FLAX SEEDS ONTO HER CEREAL DAILY     Garlic 017 MG CAPS Take 500 mg by mouth daily.      ketoconazole (NIZORAL) 2 % cream Apply 1 Application topically daily. 15 g 1   Multiple Minerals (MULTI-MINERALS PO) Take 1 tablet by mouth daily.     polyethylene glycol (MIRALAX / GLYCOLAX) 17 g packet Take 17 g by mouth daily as needed for mild constipation. 14 each 0   potassium chloride (KLOR-CON) 10 MEQ tablet TAKE 1 TABLET(10 MEQ) BY MOUTH DAILY 90 tablet 3   simvastatin (ZOCOR) 5 MG tablet Take 1 tablet (5 mg total) by mouth at bedtime. 90 tablet 2   torsemide (DEMADEX) 10 MG tablet Take 1 tablet (10 mg total) by mouth daily as needed. 30 tablet 3   Zinc Sulfate (ZINC 15 PO) Take 50 mg by mouth daily.      No facility-administered medications prior to visit.    ROS: Review of Systems  Constitutional:  Negative for activity  change, appetite change, chills, fatigue and unexpected weight change.  HENT:  Negative for congestion, mouth sores and sinus pressure.   Eyes:  Negative for visual disturbance.  Respiratory:  Negative for cough and chest tightness.   Gastrointestinal:  Negative for abdominal pain and nausea.  Genitourinary:  Negative for difficulty urinating, frequency and vaginal pain.  Musculoskeletal:  Positive for arthralgias and gait problem. Negative for back pain.  Skin:  Negative for pallor and rash.  Neurological:  Negative for dizziness, tremors, weakness, numbness and headaches.  Psychiatric/Behavioral:  Positive for confusion and decreased concentration. Negative for sleep disturbance. The patient is not nervous/anxious.     Objective:  BP 126/74 (BP Location: Left Arm, Patient Position: Sitting, Cuff Size: Normal)   Pulse 96   Temp (!) 97.5 F (36.4 C) (Oral)   Ht '5\' 1"'$  (1.549 m)   Wt 164 lb (74.4 kg)   SpO2 94%   BMI 30.99 kg/m   BP Readings from Last 3 Encounters:  05/04/22 126/74  02/02/22 130/72  08/17/21 110/80    Wt Readings from Last 3 Encounters:  05/04/22 164 lb (74.4 kg)  02/02/22 171 lb 12.8 oz (  77.9 kg)  08/17/21 166 lb (75.3 kg)    Physical Exam Constitutional:      General: She is not in acute distress.    Appearance: She is well-developed. She is obese.  HENT:     Head: Normocephalic.     Right Ear: External ear normal.     Left Ear: External ear normal.     Nose: Nose normal.  Eyes:     General:        Right eye: No discharge.        Left eye: No discharge.     Conjunctiva/sclera: Conjunctivae normal.     Pupils: Pupils are equal, round, and reactive to light.  Neck:     Thyroid: No thyromegaly.     Vascular: No JVD.     Trachea: No tracheal deviation.  Cardiovascular:     Rate and Rhythm: Normal rate and regular rhythm.     Heart sounds: Normal heart sounds.  Pulmonary:     Effort: No respiratory distress.     Breath sounds: No stridor. No  wheezing.  Abdominal:     General: Bowel sounds are normal. There is no distension.     Palpations: Abdomen is soft. There is no mass.     Tenderness: There is no abdominal tenderness. There is no guarding or rebound.  Musculoskeletal:        General: Tenderness and deformity present.     Cervical back: Normal range of motion and neck supple. No rigidity.     Right lower leg: Edema present.     Left lower leg: Edema present.  Lymphadenopathy:     Cervical: No cervical adenopathy.  Skin:    Findings: No erythema or rash.  Neurological:     Mental Status: She is disoriented.     Cranial Nerves: No cranial nerve deficit.     Motor: No abnormal muscle tone.     Coordination: Coordination normal.     Gait: Gait abnormal.     Deep Tendon Reflexes: Reflexes normal.  Psychiatric:        Behavior: Behavior normal.   Antalgic gait Deformed feet Thick toenails Bilateral hard calluses Bilateral leg edema 1+  Procedure:   B foot callus paring/cutting x2 Indication:    foot callus, painful, thick toenails Consent: verbal  Risks and benefits were explained to the patient. Skin was cleaned with alcohol. I removed a large callus x2 carefully with a round blade. Skin remained intact. Pain is better. Tolerated well. Complications: none.     Lab Results  Component Value Date   WBC 8.1 02/02/2022   HGB 15.5 (H) 02/02/2022   HCT 46.3 (H) 02/02/2022   PLT 168.0 02/02/2022   GLUCOSE 104 (H) 02/02/2022   CHOL 148 02/17/2021   TRIG 82.0 02/17/2021   HDL 50.80 02/17/2021   LDLDIRECT 139.3 02/21/2008   LDLCALC 81 02/17/2021   ALT 18 02/02/2022   AST 20 02/02/2022   NA 142 02/02/2022   K 4.0 02/02/2022   CL 104 02/02/2022   CREATININE 0.70 02/02/2022   BUN 16 02/02/2022   CO2 30 02/02/2022   TSH 1.28 02/02/2022   HGBA1C 5.9 02/02/2022   MICROALBUR 0.4 06/25/2008    MM 3D SCREEN BREAST BILATERAL  Result Date: 01/25/2021 CLINICAL DATA:  Screening. EXAM: DIGITAL SCREENING BILATERAL  MAMMOGRAM WITH TOMOSYNTHESIS AND CAD TECHNIQUE: Bilateral screening digital craniocaudal and mediolateral oblique mammograms were obtained. Bilateral screening digital breast tomosynthesis was performed. The images were evaluated with computer-aided detection.  COMPARISON:  Previous exam(s). ACR Breast Density Category b: There are scattered areas of fibroglandular density. FINDINGS: There are no findings suspicious for malignancy. IMPRESSION: No mammographic evidence of malignancy. A result letter of this screening mammogram will be mailed directly to the patient. RECOMMENDATION: Screening mammogram in one year. (Code:SM-B-01Y) BI-RADS CATEGORY  1: Negative. Electronically Signed   By: Dorise Bullion III M.D.   On: 01/25/2021 11:28    Assessment & Plan:   Problem List Items Addressed This Visit     Essential hypertension    Chronic Bumex qam      Elevated glucose   Relevant Orders   Comprehensive metabolic panel   Hemoglobin A1c   Callus of foot    Painful.  See procedure      Atrial fibrillation ([place]) - Primary    Doing well      Relevant Orders   Comprehensive metabolic panel   Alzheimer disease ([place])    Continue with Aricept.  Discussed with the patient and her children.         No orders of the defined types were placed in this encounter.     Follow-up: Return in about 4 months (around 09/02/2022) for a follow-up visit.  Walker Kehr, MD

## 2022-05-12 ENCOUNTER — Ambulatory Visit: Payer: Medicare Other

## 2022-05-18 ENCOUNTER — Ambulatory Visit: Payer: Medicare Other

## 2022-05-23 ENCOUNTER — Ambulatory Visit: Payer: Medicare Other

## 2022-05-30 DIAGNOSIS — L84 Corns and callosities: Secondary | ICD-10-CM | POA: Insufficient documentation

## 2022-05-30 NOTE — Assessment & Plan Note (Signed)
Continue with Aricept.  Discussed with the patient and her children.

## 2022-05-30 NOTE — Assessment & Plan Note (Signed)
Doing well 

## 2022-05-30 NOTE — Assessment & Plan Note (Signed)
Painful.  See procedure

## 2022-07-03 ENCOUNTER — Ambulatory Visit: Payer: Medicare Other

## 2022-07-27 ENCOUNTER — Telehealth: Payer: Self-pay

## 2022-07-27 NOTE — Telephone Encounter (Signed)
Pts son called in stating pts not eating and has been sleeping a lot.  Pts cleaning lady was at the house and she informed pts son that pt was sleeping a lot and dosing off. Cleaning lady told pt son pt was even sleeping when she was vacuuming. Pt is only drinking ginger ale. Pt is eating very little.  Pts son is wanting pt to come in to see Dr. Alain [place] ASAP cause he has stated pt does not look to good and he notice that something is wrong.

## 2022-07-28 NOTE — Telephone Encounter (Signed)
Called and scheduled appt for pt to come in on Monday to see Dr. Camila Li

## 2022-07-31 ENCOUNTER — Ambulatory Visit: Payer: Medicare Other | Admitting: Internal Medicine

## 2022-08-02 ENCOUNTER — Encounter: Payer: Self-pay | Admitting: Internal Medicine

## 2022-08-02 ENCOUNTER — Ambulatory Visit (INDEPENDENT_AMBULATORY_CARE_PROVIDER_SITE_OTHER): Payer: Medicare Other | Admitting: Internal Medicine

## 2022-08-02 VITALS — BP 122/82 | HR 105 | Temp 97.8°F | Ht 61.0 in | Wt 173.0 lb

## 2022-08-02 DIAGNOSIS — G8929 Other chronic pain: Secondary | ICD-10-CM

## 2022-08-02 DIAGNOSIS — E538 Deficiency of other specified B group vitamins: Secondary | ICD-10-CM

## 2022-08-02 DIAGNOSIS — I10 Essential (primary) hypertension: Secondary | ICD-10-CM | POA: Diagnosis not present

## 2022-08-02 DIAGNOSIS — R21 Rash and other nonspecific skin eruption: Secondary | ICD-10-CM

## 2022-08-02 DIAGNOSIS — R269 Unspecified abnormalities of gait and mobility: Secondary | ICD-10-CM | POA: Diagnosis not present

## 2022-08-02 DIAGNOSIS — M25562 Pain in left knee: Secondary | ICD-10-CM

## 2022-08-02 DIAGNOSIS — F0280 Dementia in other diseases classified elsewhere without behavioral disturbance: Secondary | ICD-10-CM

## 2022-08-02 DIAGNOSIS — M25561 Pain in right knee: Secondary | ICD-10-CM | POA: Insufficient documentation

## 2022-08-02 DIAGNOSIS — G309 Alzheimer's disease, unspecified: Secondary | ICD-10-CM

## 2022-08-02 DIAGNOSIS — M5440 Lumbago with sciatica, unspecified side: Secondary | ICD-10-CM

## 2022-08-02 DIAGNOSIS — I872 Venous insufficiency (chronic) (peripheral): Secondary | ICD-10-CM

## 2022-08-02 MED ORDER — KETOCONAZOLE 2 % EX CREA: 1.0000 | TOPICAL_CREAM | Freq: Two times a day (BID) | CUTANEOUS | 1 refills | Status: DC

## 2022-08-02 MED ORDER — METHYLPREDNISOLONE ACETATE 40 MG/ML IJ SUSP
40.0000 mg | Freq: Once | INTRAMUSCULAR | Status: AC
  Administered 2022-08-02: 40 mg via INTRAMUSCULAR

## 2022-08-02 MED ORDER — CYANOCOBALAMIN 1000 MCG/ML IJ SOLN
1000.0000 ug | Freq: Once | INTRAMUSCULAR | Status: AC
  Administered 2022-08-02: 1000 ug via INTRAMUSCULAR

## 2022-08-02 MED ORDER — KETOCONAZOLE 200 MG PO TABS: 200.0000 mg | ORAL_TABLET | Freq: Every day | ORAL | 1 refills | Status: DC

## 2022-08-02 NOTE — Assessment & Plan Note (Addendum)
Intertrigo under breasts right greater than left, extensive  Poor compliance with topical treatment.   Will start po and topical Ketoconazole

## 2022-08-02 NOTE — Assessment & Plan Note (Addendum)
Worse.  Likely osteoarthritis related.  Options discussed. Will inject both knees with steroids.  Hopefully it will improve her mobility.

## 2022-08-02 NOTE — Assessment & Plan Note (Signed)
Chronic Bumex qam  Torsemide stopped

## 2022-08-02 NOTE — Assessment & Plan Note (Signed)
Injection of vitamin B12 1000 mcg subcutaneous was given.

## 2022-08-02 NOTE — Assessment & Plan Note (Addendum)
Progressing memory loss.  Sundowning symptoms at night.  Poor compliance.  Will restart Aricept 5 mg a day

## 2022-08-02 NOTE — Assessment & Plan Note (Signed)
Continue to use a walker.  Overall better

## 2022-08-02 NOTE — Assessment & Plan Note (Signed)
Will inject the knee

## 2022-08-02 NOTE — Progress Notes (Signed)
Subjective:  Patient ID: Kathleen Mejia, female    DOB: 03/01/1934  Age: 89 y.o. MRN: [id number]  CC: Rash (Big rash under her breasts , is sleeping a lot and barely eating )   HPI Kathleen Mejia presents for rash under breasts -not better.  She has not been compliant with treatment She is complaining of?pressure sores or rash between the buttocks She has been sitting in her chair all day, not moving much She is complaining of prepared knee pain especially on the right She continues to have leg swelling She is here with her son Kathleen Mejia  Outpatient Medications Prior to Visit  Medication Sig Dispense Refill   Ascorbic Acid (VITAMIN C) 1000 MG tablet Take 1,000 mg by mouth daily.      aspirin EC 81 MG tablet Take 81 mg by mouth daily. Swallow whole.     bumetanide (BUMEX) 0.5 MG tablet TAKE 1 TABLET AT BY MOUTH DAILY WITH BREAKFAST 90 tablet 3   Cod Liver Oil 1000 MG CAPS Take 1,000 mg by mouth daily.      Coenzyme Q10 (CO Q 10 PO) Take 250 mg by mouth daily.      CRANBERRY PO Take 1 capsule by mouth daily.      docusate sodium (COLACE) 100 MG capsule Take 1 capsule (100 mg total) by mouth 2 (two) times daily. 60 capsule 0   donepezil (ARICEPT) 5 MG tablet TAKE 1 TABLET(5 MG) BY MOUTH AT BEDTIME 90 tablet 3   FLAXSEED, LINSEED, PO Take 0.5 Scoops by mouth daily. PATIENT SPRINKLE FLAX SEEDS ONTO HER CEREAL DAILY     Garlic [id number] MG CAPS Take 500 mg by mouth daily.      Multiple Minerals (MULTI-MINERALS PO) Take 1 tablet by mouth daily.     polyethylene glycol (MIRALAX / GLYCOLAX) 17 g packet Take 17 g by mouth daily as needed for mild constipation. 14 each 0   potassium chloride (KLOR-CON) 10 MEQ tablet TAKE 1 TABLET(10 MEQ) BY MOUTH DAILY 90 tablet 3   simvastatin (ZOCOR) 5 MG tablet Take 1 tablet (5 mg total) by mouth at bedtime. 90 tablet 2   torsemide (DEMADEX) 10 MG tablet Take 1 tablet (10 mg total) by mouth daily as needed. 30 tablet 3   Zinc Sulfate (ZINC 15 PO) Take 50 mg by  mouth daily.      ketoconazole (NIZORAL) 2 % cream Apply 1 Application topically daily. 15 g 1   No facility-administered medications prior to visit.    ROS: Review of Systems  Constitutional:  Positive for fatigue. Negative for activity change, appetite change, chills and unexpected weight change.  HENT:  Negative for congestion, mouth sores and sinus pressure.   Eyes:  Negative for visual disturbance.  Respiratory:  Negative for cough and chest tightness.   Cardiovascular:  Positive for leg swelling.  Gastrointestinal:  Negative for abdominal pain and nausea.  Genitourinary:  Negative for difficulty urinating, frequency and vaginal pain.  Musculoskeletal:  Positive for arthralgias, back pain and gait problem.  Skin:  Positive for rash. Negative for pallor.  Neurological:  Positive for weakness. Negative for dizziness, tremors, numbness and headaches.  Psychiatric/Behavioral:  Positive for confusion and decreased concentration. Negative for sleep disturbance and suicidal ideas. The patient is not nervous/anxious.     Objective:  BP 122/82 (BP Location: Left Arm, Patient Position: Sitting, Cuff Size: Normal)   Pulse (!) 105   Temp 97.8 F (36.6 C) (Oral)   Ht '5\' 1"'$  (  1.549 m)   Wt 173 lb (78.5 kg)   SpO2 97%   BMI 32.69 kg/m   BP Readings from Last 3 Encounters:  08/02/22 122/82  05/04/22 126/74  02/02/22 130/72    Wt Readings from Last 3 Encounters:  08/02/22 173 lb (78.5 kg)  05/04/22 164 lb (74.4 kg)  02/02/22 171 lb 12.8 oz (77.9 kg)    Physical Exam Constitutional:      General: She is not in acute distress.    Appearance: She is well-developed. She is obese.  HENT:     Head: Normocephalic.     Right Ear: External ear normal.     Left Ear: External ear normal.     Nose: Nose normal.  Eyes:     General:        Right eye: No discharge.        Left eye: No discharge.     Conjunctiva/sclera: Conjunctivae normal.     Pupils: Pupils are equal, round, and  reactive to light.  Neck:     Thyroid: No thyromegaly.     Vascular: No JVD.     Trachea: No tracheal deviation.  Cardiovascular:     Rate and Rhythm: Normal rate and regular rhythm.     Heart sounds: Normal heart sounds.  Pulmonary:     Effort: No respiratory distress.     Breath sounds: No stridor. No wheezing.  Abdominal:     General: Bowel sounds are normal. There is no distension.     Palpations: Abdomen is soft. There is no mass.     Tenderness: There is no abdominal tenderness. There is no guarding or rebound.  Musculoskeletal:        General: Tenderness present.     Cervical back: Normal range of motion and neck supple. No rigidity.  Lymphadenopathy:     Cervical: No cervical adenopathy.  Skin:    Findings: No erythema or rash.  Neurological:     Cranial Nerves: No cranial nerve deficit.     Motor: No abnormal muscle tone.     Coordination: Coordination abnormal.     Gait: Gait abnormal.     Deep Tendon Reflexes: Reflexes normal.  Psychiatric:        Behavior: Behavior normal.        Thought Content: Thought content normal.        Judgment: Judgment normal.    Antalgic gait Using a walker Leg edema is 1+ bilaterally up to the proximal shin levels B knee pain   Procedure Note :     Procedure : Joint Injection, R, L   knee   Indication:  Joint osteoarthritis with refractory  chronic pain.   Risks including unsuccessful procedure , bleeding, infection, bruising, skin atrophy, "steroid flare-up" and others were explained to the patient in detail as well as the benefits. Informed consent was obtained verbally.  Tthe patient was placed in a comfortable position. Lateral approach was used on both knees. Skin was prepped with Betadine and alcohol  and anesthetized a cooling spray. Then, a 5 cc syringe with a 1.5 inch long 25-gauge needle was used for a joint injection.. The needle was advanced  Into the knee joint cavity. I aspirated a small amount of intra-articular  fluid to confirm correct placement of the needle and injected the joint with 5 mL of 2% lidocaine and 40 mg of Depo-Medrol each.  Band-Aid was applied on both sides.   Tolerated well. Complications: None. Good pain relief following  the procedure.   Postprocedure instructions :    A Band-Aid should be left on for 12 hours. Injection therapy is not a cure itself. It is used in conjunction with other modalities. You can use nonsteroidal anti-inflammatories like ibuprofen , hot and cold compresses. Rest is recommended in the next 24 hours. You need to report immediately  if fever, chills or any signs of infection develop.    A total time of 45 minutes (excluding procedure time) was spent preparing to see the patient, reviewing tests, x-rays, operative reports and other medical records.  Also, obtaining history and performing comprehensive physical exam.  Additionally, counseling the patient and her son regarding arthritis, dementia, compliance.   Finally, documenting clinical information in the health records, coordination of care, educating the patient. It is a complex case.    Lab Results  Component Value Date   WBC 8.1 02/02/2022   HGB 15.5 (H) 02/02/2022   HCT 46.3 (H) 02/02/2022   PLT 168.0 02/02/2022   GLUCOSE 104 (H) 02/02/2022   CHOL 148 02/17/2021   TRIG 82.0 02/17/2021   HDL 50.80 02/17/2021   LDLDIRECT 139.3 02/21/2008   LDLCALC 81 02/17/2021   ALT 18 02/02/2022   AST 20 02/02/2022   NA 142 02/02/2022   K 4.0 02/02/2022   CL 104 02/02/2022   CREATININE 0.70 02/02/2022   BUN 16 02/02/2022   CO2 30 02/02/2022   TSH 1.28 02/02/2022   HGBA1C 5.9 02/02/2022   MICROALBUR 0.4 06/25/2008    MM 3D SCREEN BREAST BILATERAL  Result Date: 01/25/2021 CLINICAL DATA:  Screening. EXAM: DIGITAL SCREENING BILATERAL MAMMOGRAM WITH TOMOSYNTHESIS AND CAD TECHNIQUE: Bilateral screening digital craniocaudal and mediolateral oblique mammograms were obtained. Bilateral screening digital breast  tomosynthesis was performed. The images were evaluated with computer-aided detection. COMPARISON:  Previous exam(s). ACR Breast Density Category b: There are scattered areas of fibroglandular density. FINDINGS: There are no findings suspicious for malignancy. IMPRESSION: No mammographic evidence of malignancy. A result letter of this screening mammogram will be mailed directly to the patient. RECOMMENDATION: Screening mammogram in one year. (Code:SM-B-01Y) BI-RADS CATEGORY  1: Negative. Electronically Signed   By: Dorise Bullion III M.D.   On: 01/25/2021 11:28    Assessment & Plan:   Problem List Items Addressed This Visit       Cardiovascular and Mediastinum   Essential hypertension - Primary    Chronic Bumex qam  Torsemide stopped        Nervous and Auditory   Alzheimer disease ([place])    Progressing memory loss.  Sundowning symptoms at night.  Poor compliance.  Will restart Aricept 5 mg a day        Musculoskeletal and Integument   Stasis dermatitis of both legs    Will treat edema.  She needs to move more.        SKIN RASH    Intertrigo under breasts right greater than left, extensive  Poor compliance with topical treatment.   Will start po and topical Ketoconazole        Other   LOW BACK PAIN    Continue to use a walker.  Overall better      Knee pain, bilateral    Worse.  Likely osteoarthritis related.  Options discussed. Will inject both knees with steroids.  Hopefully it will improve her mobility.      Gait disorder    Will inject the knee      B12 deficiency    Injection of vitamin B12  1000 mcg subcutaneous was given.         Meds ordered this encounter  Medications   ketoconazole (NIZORAL) 2 % cream    Sig: Apply 1 Application topically 2 (two) times daily.    Dispense:  120 g    Refill:  1   ketoconazole (NIZORAL) 200 MG tablet    Sig: Take 1 tablet (200 mg total) by mouth daily.    Dispense:  10 tablet    Refill:  1   cyanocobalamin (VITAMIN  B12) injection 1,000 mcg   methylPREDNISolone acetate (DEPO-MEDROL) injection 40 mg   methylPREDNISolone acetate (DEPO-MEDROL) injection 40 mg      Follow-up: Return in about 3 months (around 10/31/2022) for a follow-up visit.  Walker Kehr, MD

## 2022-08-02 NOTE — Assessment & Plan Note (Signed)
Will treat edema.  She needs to move more.

## 2022-08-04 ENCOUNTER — Ambulatory Visit: Payer: Medicare Other

## 2022-08-07 ENCOUNTER — Telehealth: Payer: Self-pay | Admitting: Internal Medicine

## 2022-08-07 ENCOUNTER — Other Ambulatory Visit: Payer: Self-pay | Admitting: Internal Medicine

## 2022-08-07 MED ORDER — KETOCONAZOLE 2 % EX CREA: 1.0000 | TOPICAL_CREAM | Freq: Two times a day (BID) | CUTANEOUS | 1 refills | Status: DC

## 2022-08-07 MED ORDER — POTASSIUM CHLORIDE ER 10 MEQ PO TBCR: 10.0000 meq | EXTENDED_RELEASE_TABLET | Freq: Every day | ORAL | 3 refills | Status: DC

## 2022-08-07 MED ORDER — KETOCONAZOLE 200 MG PO TABS: 200.0000 mg | ORAL_TABLET | Freq: Every day | ORAL | 1 refills | Status: DC

## 2022-08-07 NOTE — Telephone Encounter (Signed)
Caller & Relationship to patient: Son  Call back number: [phone]  Date of last office visit: 08/02/22  Date of next office visit: 10/23/22  Medication(s) to be refilled:  potassium chloride (KLOR-CON) 10 MEQ tablet   ketoconazole (NIZORAL) 2 % cream   ketoconazole (NIZORAL) 200 MG tablet   Preferred Pharmacy:   [facility] [place], [place] - [place]    PT's son noted that they knew why the potassium chloride was not in, however the other two scripts were put in 08/02/22 on our end. Pharmacy stated they had not received any prescriptions at all for the patient this year.

## 2022-08-07 NOTE — Telephone Encounter (Signed)
Refills sent to [facility]./l,mb

## 2022-08-17 ENCOUNTER — Other Ambulatory Visit: Payer: Self-pay | Admitting: Internal Medicine

## 2022-08-31 ENCOUNTER — Ambulatory Visit: Payer: Medicare Other

## 2022-09-02 ENCOUNTER — Other Ambulatory Visit: Payer: Self-pay | Admitting: Internal Medicine

## 2022-09-05 ENCOUNTER — Ambulatory Visit: Payer: Medicare Other | Admitting: Internal Medicine

## 2022-09-28 NOTE — Progress Notes (Deleted)
    Subjective:    Patient ID: Kathleen Mejia, female    DOB: [birth year], 89 y.o.   MRN: [id number]      HPI Kathleen Mejia is here for No chief complaint on file.    Leg edema -    Torsemide stopped 07/2022 - bumex started.       Medications and allergies reviewed with patient and updated if appropriate.  Current Outpatient Medications on File Prior to Visit  Medication Sig Dispense Refill   Ascorbic Acid (VITAMIN C) 1000 MG tablet Take 1,000 mg by mouth daily.      aspirin EC 81 MG tablet Take 81 mg by mouth daily. Swallow whole.     bumetanide (BUMEX) 0.5 MG tablet TAKE 1 TABLET BY MOUTH DAILY WITH BREAKFAST 90 tablet 3   Cod Liver Oil 1000 MG CAPS Take 1,000 mg by mouth daily.      Coenzyme Q10 (CO Q 10 PO) Take 250 mg by mouth daily.      CRANBERRY PO Take 1 capsule by mouth daily.      docusate sodium (COLACE) 100 MG capsule Take 1 capsule (100 mg total) by mouth 2 (two) times daily. 60 capsule 0   donepezil (ARICEPT) 5 MG tablet TAKE 1 TABLET(5 MG) BY MOUTH AT BEDTIME 90 tablet 3   FLAXSEED, LINSEED, PO Take 0.5 Scoops by mouth daily. PATIENT SPRINKLE FLAX SEEDS ONTO HER CEREAL DAILY     Garlic 500 MG CAPS Take 500 mg by mouth daily.      ketoconazole (NIZORAL) 2 % cream Apply 1 Application topically 2 (two) times daily. 120 g 1   ketoconazole (NIZORAL) 200 MG tablet TAKE 1 TABLET(200 MG) BY MOUTH DAILY 14 tablet 0   Multiple Minerals (MULTI-MINERALS PO) Take 1 tablet by mouth daily.     polyethylene glycol (MIRALAX / GLYCOLAX) 17 g packet Take 17 g by mouth daily as needed for mild constipation. 14 each 0   potassium chloride (KLOR-CON) 10 MEQ tablet Take 1 tablet (10 mEq total) by mouth daily. 90 tablet 3   simvastatin (ZOCOR) 5 MG tablet Take 1 tablet (5 mg total) by mouth at bedtime. 90 tablet 2   torsemide (DEMADEX) 10 MG tablet TAKE 1 TABLET(10 MG) BY MOUTH DAILY AS NEEDED 90 tablet 3   Zinc Sulfate (ZINC 15 PO) Take 50 mg by mouth daily.      No current  facility-administered medications on file prior to visit.    Review of Systems     Objective:  There were no vitals filed for this visit. BP Readings from Last 3 Encounters:  08/02/22 122/82  05/04/22 126/74  02/02/22 130/72   Wt Readings from Last 3 Encounters:  08/02/22 173 lb (78.5 kg)  05/04/22 164 lb (74.4 kg)  02/02/22 171 lb 12.8 oz (77.9 kg)   There is no height or weight on file to calculate BMI.    Physical Exam         Assessment & Plan:    See Problem List for Assessment and Plan of chronic medical problems.

## 2022-09-29 ENCOUNTER — Ambulatory Visit: Payer: Medicare Other | Admitting: Internal Medicine

## 2022-09-29 DIAGNOSIS — R609 Edema, unspecified: Secondary | ICD-10-CM

## 2022-10-03 ENCOUNTER — Encounter: Payer: Self-pay | Admitting: Internal Medicine

## 2022-10-03 NOTE — Progress Notes (Deleted)
    Subjective:    Patient ID: Kathleen Mejia, female    DOB: 09/26/1933, 89 y.o.   MRN: [id number]      HPI Kathleen Mejia is here for No chief complaint on file.    Leg edema -    Torsemide stopped 07/2022 - bumex started.       Medications and allergies reviewed with patient and updated if appropriate.  Current Outpatient Medications on File Prior to Visit  Medication Sig Dispense Refill   Ascorbic Acid (VITAMIN C) 1000 MG tablet Take 1,000 mg by mouth daily.      aspirin EC 81 MG tablet Take 81 mg by mouth daily. Swallow whole.     bumetanide (BUMEX) 0.5 MG tablet TAKE 1 TABLET BY MOUTH DAILY WITH BREAKFAST 90 tablet 3   Cod Liver Oil 1000 MG CAPS Take 1,000 mg by mouth daily.      Coenzyme Q10 (CO Q 10 PO) Take 250 mg by mouth daily.      CRANBERRY PO Take 1 capsule by mouth daily.      docusate sodium (COLACE) 100 MG capsule Take 1 capsule (100 mg total) by mouth 2 (two) times daily. 60 capsule 0   donepezil (ARICEPT) 5 MG tablet TAKE 1 TABLET(5 MG) BY MOUTH AT BEDTIME 90 tablet 3   FLAXSEED, LINSEED, PO Take 0.5 Scoops by mouth daily. PATIENT SPRINKLE FLAX SEEDS ONTO HER CEREAL DAILY     Garlic 500 MG CAPS Take 500 mg by mouth daily.      ketoconazole (NIZORAL) 2 % cream Apply 1 Application topically 2 (two) times daily. 120 g 1   ketoconazole (NIZORAL) 200 MG tablet TAKE 1 TABLET(200 MG) BY MOUTH DAILY 14 tablet 0   Multiple Minerals (MULTI-MINERALS PO) Take 1 tablet by mouth daily.     polyethylene glycol (MIRALAX / GLYCOLAX) 17 g packet Take 17 g by mouth daily as needed for mild constipation. 14 each 0   potassium chloride (KLOR-CON) 10 MEQ tablet Take 1 tablet (10 mEq total) by mouth daily. 90 tablet 3   simvastatin (ZOCOR) 5 MG tablet Take 1 tablet (5 mg total) by mouth at bedtime. 90 tablet 2   torsemide (DEMADEX) 10 MG tablet TAKE 1 TABLET(10 MG) BY MOUTH DAILY AS NEEDED 90 tablet 3   Zinc Sulfate (ZINC 15 PO) Take 50 mg by mouth daily.      No current  facility-administered medications on file prior to visit.    Review of Systems     Objective:  There were no vitals filed for this visit. BP Readings from Last 3 Encounters:  08/02/22 122/82  05/04/22 126/74  02/02/22 130/72   Wt Readings from Last 3 Encounters:  08/02/22 173 lb (78.5 kg)  05/04/22 164 lb (74.4 kg)  02/02/22 171 lb 12.8 oz (77.9 kg)   There is no height or weight on file to calculate BMI.    Physical Exam         Assessment & Plan:    See Problem List for Assessment and Plan of chronic medical problems.      

## 2022-10-04 ENCOUNTER — Ambulatory Visit: Payer: Medicare Other | Admitting: Internal Medicine

## 2022-10-05 ENCOUNTER — Ambulatory Visit: Payer: Medicare Other

## 2022-10-17 ENCOUNTER — Ambulatory Visit: Payer: Medicare Other | Admitting: Internal Medicine

## 2022-10-23 ENCOUNTER — Ambulatory Visit: Payer: Medicare Other | Admitting: Internal Medicine

## 2022-10-31 ENCOUNTER — Ambulatory Visit: Payer: Medicare Other | Admitting: Internal Medicine

## 2022-11-03 ENCOUNTER — Inpatient Hospital Stay (HOSPITAL_COMMUNITY)
Admission: EM | Admit: 2022-11-03 | Discharge: 2022-11-09 | DRG: 602 | Disposition: A | Discharge status: Skilled Nursing Facility | Payer: Medicare Other | Attending: Family Medicine | Admitting: Family Medicine

## 2022-11-03 ENCOUNTER — Emergency Department (HOSPITAL_COMMUNITY): Payer: Medicare Other

## 2022-11-03 ENCOUNTER — Ambulatory Visit: Payer: Medicare Other | Admitting: Family Medicine

## 2022-11-03 ENCOUNTER — Emergency Department (HOSPITAL_BASED_OUTPATIENT_CLINIC_OR_DEPARTMENT_OTHER)
Admit: 2022-11-03 | Discharge: 2022-11-03 | Disposition: A | Discharge status: Home / Self Care | Payer: Medicare Other | Attending: Emergency Medicine | Admitting: Emergency Medicine

## 2022-11-03 ENCOUNTER — Encounter (HOSPITAL_COMMUNITY): Payer: Self-pay

## 2022-11-03 ENCOUNTER — Other Ambulatory Visit: Payer: Self-pay

## 2022-11-03 DIAGNOSIS — T502X5A Adverse effect of carbonic-anhydrase inhibitors, benzothiadiazides and other diuretics, initial encounter: Secondary | ICD-10-CM | POA: Diagnosis not present

## 2022-11-03 DIAGNOSIS — Z803 Family history of malignant neoplasm of breast: Secondary | ICD-10-CM

## 2022-11-03 DIAGNOSIS — I110 Hypertensive heart disease with heart failure: Secondary | ICD-10-CM | POA: Diagnosis not present

## 2022-11-03 DIAGNOSIS — M1990 Unspecified osteoarthritis, unspecified site: Secondary | ICD-10-CM | POA: Diagnosis present

## 2022-11-03 DIAGNOSIS — I5043 Acute on chronic combined systolic (congestive) and diastolic (congestive) heart failure: Secondary | ICD-10-CM | POA: Diagnosis present

## 2022-11-03 DIAGNOSIS — Z66 Do not resuscitate: Secondary | ICD-10-CM | POA: Diagnosis not present

## 2022-11-03 DIAGNOSIS — I340 Nonrheumatic mitral (valve) insufficiency: Secondary | ICD-10-CM | POA: Insufficient documentation

## 2022-11-03 DIAGNOSIS — Z888 Allergy status to other drugs, medicaments and biological substances status: Secondary | ICD-10-CM

## 2022-11-03 DIAGNOSIS — I051 Rheumatic mitral insufficiency: Secondary | ICD-10-CM | POA: Diagnosis present

## 2022-11-03 DIAGNOSIS — E669 Obesity, unspecified: Secondary | ICD-10-CM | POA: Diagnosis not present

## 2022-11-03 DIAGNOSIS — E785 Hyperlipidemia, unspecified: Secondary | ICD-10-CM | POA: Diagnosis not present

## 2022-11-03 DIAGNOSIS — L03115 Cellulitis of right lower limb: Secondary | ICD-10-CM | POA: Diagnosis not present

## 2022-11-03 DIAGNOSIS — Z6832 Body mass index (BMI) 32.0-32.9, adult: Secondary | ICD-10-CM

## 2022-11-03 DIAGNOSIS — G309 Alzheimer's disease, unspecified: Secondary | ICD-10-CM | POA: Diagnosis present

## 2022-11-03 DIAGNOSIS — E876 Hypokalemia: Secondary | ICD-10-CM | POA: Diagnosis not present

## 2022-11-03 DIAGNOSIS — M7989 Other specified soft tissue disorders: Secondary | ICD-10-CM

## 2022-11-03 DIAGNOSIS — I10 Essential (primary) hypertension: Secondary | ICD-10-CM | POA: Diagnosis not present

## 2022-11-03 DIAGNOSIS — R531 Weakness: Secondary | ICD-10-CM

## 2022-11-03 DIAGNOSIS — F05 Delirium due to known physiological condition: Secondary | ICD-10-CM | POA: Diagnosis not present

## 2022-11-03 DIAGNOSIS — M545 Low back pain, unspecified: Secondary | ICD-10-CM | POA: Diagnosis not present

## 2022-11-03 DIAGNOSIS — Z8249 Family history of ischemic heart disease and other diseases of the circulatory system: Secondary | ICD-10-CM | POA: Diagnosis not present

## 2022-11-03 DIAGNOSIS — I480 Paroxysmal atrial fibrillation: Secondary | ICD-10-CM | POA: Diagnosis not present

## 2022-11-03 DIAGNOSIS — F419 Anxiety disorder, unspecified: Secondary | ICD-10-CM | POA: Diagnosis not present

## 2022-11-03 DIAGNOSIS — K760 Fatty (change of) liver, not elsewhere classified: Secondary | ICD-10-CM | POA: Diagnosis present

## 2022-11-03 DIAGNOSIS — F0280 Dementia in other diseases classified elsewhere without behavioral disturbance: Secondary | ICD-10-CM | POA: Diagnosis present

## 2022-11-03 DIAGNOSIS — L0390 Cellulitis, unspecified: Secondary | ICD-10-CM | POA: Diagnosis present

## 2022-11-03 DIAGNOSIS — R600 Localized edema: Secondary | ICD-10-CM

## 2022-11-03 DIAGNOSIS — Z800 Family history of malignant neoplasm of digestive organs: Secondary | ICD-10-CM

## 2022-11-03 DIAGNOSIS — H409 Unspecified glaucoma: Secondary | ICD-10-CM | POA: Diagnosis present

## 2022-11-03 DIAGNOSIS — R609 Edema, unspecified: Secondary | ICD-10-CM | POA: Diagnosis not present

## 2022-11-03 DIAGNOSIS — Z9049 Acquired absence of other specified parts of digestive tract: Secondary | ICD-10-CM | POA: Diagnosis not present

## 2022-11-03 DIAGNOSIS — L03116 Cellulitis of left lower limb: Secondary | ICD-10-CM

## 2022-11-03 DIAGNOSIS — Z79899 Other long term (current) drug therapy: Secondary | ICD-10-CM

## 2022-11-03 DIAGNOSIS — M170 Bilateral primary osteoarthritis of knee: Secondary | ICD-10-CM | POA: Diagnosis not present

## 2022-11-03 DIAGNOSIS — M1712 Unilateral primary osteoarthritis, left knee: Secondary | ICD-10-CM | POA: Diagnosis not present

## 2022-11-03 DIAGNOSIS — L03119 Cellulitis of unspecified part of limb: Secondary | ICD-10-CM | POA: Diagnosis present

## 2022-11-03 DIAGNOSIS — Z7982 Long term (current) use of aspirin: Secondary | ICD-10-CM | POA: Diagnosis not present

## 2022-11-03 DIAGNOSIS — Z841 Family history of disorders of kidney and ureter: Secondary | ICD-10-CM

## 2022-11-03 DIAGNOSIS — I5021 Acute systolic (congestive) heart failure: Secondary | ICD-10-CM | POA: Diagnosis not present

## 2022-11-03 DIAGNOSIS — I4891 Unspecified atrial fibrillation: Secondary | ICD-10-CM | POA: Diagnosis not present

## 2022-11-03 DIAGNOSIS — Z7401 Bed confinement status: Secondary | ICD-10-CM | POA: Diagnosis not present

## 2022-11-03 DIAGNOSIS — I5020 Unspecified systolic (congestive) heart failure: Secondary | ICD-10-CM | POA: Diagnosis not present

## 2022-11-03 LAB — CBC
HCT: 47.7 % — ABNORMAL HIGH (ref 36.0–46.0)
HCT: 47.7 % — ABNORMAL HIGH (ref 36.0–46.0)
Hemoglobin: 15.7 g/dL — ABNORMAL HIGH (ref 12.0–15.0)
Hemoglobin: 15.8 g/dL — ABNORMAL HIGH (ref 12.0–15.0)
MCH: 32.3 pg (ref 26.0–34.0)
MCH: 32.9 pg (ref 26.0–34.0)
MCHC: 32.9 g/dL (ref 30.0–36.0)
MCHC: 33.1 g/dL (ref 30.0–36.0)
MCV: 98.1 fL (ref 80.0–100.0)
MCV: 99.4 fL (ref 80.0–100.0)
Platelets: 238 10*3/uL (ref 150–400)
Platelets: 212 10*3/uL (ref 150–400)
RBC: 4.86 MIL/uL (ref 3.87–5.11)
RBC: 4.8 MIL/uL (ref 3.87–5.11)
RDW: 12.7 % (ref 11.5–15.5)
RDW: 12.5 % (ref 11.5–15.5)
WBC: 10.6 10*3/uL — ABNORMAL HIGH (ref 4.0–10.5)
WBC: 10.7 10*3/uL — ABNORMAL HIGH (ref 4.0–10.5)
nRBC: 0 % (ref 0.0–0.2)
nRBC: 0 % (ref 0.0–0.2)

## 2022-11-03 LAB — BASIC METABOLIC PANEL
Anion gap: 9 (ref 5–15)
BUN: 23 mg/dL (ref 8–23)
CO2: 30 mmol/L (ref 22–32)
Calcium: 9.7 mg/dL (ref 8.9–10.3)
Chloride: 99 mmol/L (ref 98–111)
Creatinine, Ser: 0.71 mg/dL (ref 0.44–1.00)
GFR, Estimated: >60 mL/min (ref >60)
Glucose, Bld: 117 mg/dL — ABNORMAL HIGH (ref 70–99)
Potassium: 3.8 mmol/L (ref 3.5–5.1)
Sodium: 138 mmol/L (ref 135–145)

## 2022-11-03 LAB — BRAIN NATRIURETIC PEPTIDE: B Natriuretic Peptide: 252.6 pg/mL — ABNORMAL HIGH (ref 0.0–100.0)

## 2022-11-03 MED ORDER — SODIUM CHLORIDE 0.9 % IV SOLN
1.0000 g | INTRAVENOUS | Status: DC
  Administered 2022-11-03 – 2022-11-07 (×5): 1 g via INTRAVENOUS
  Filled 2022-11-03 (×5): qty 10

## 2022-11-03 MED ORDER — CEFAZOLIN SODIUM-DEXTROSE 1-4 GM/50ML-% IV SOLN
1.0000 g | Freq: Once | INTRAVENOUS | Status: AC
  Administered 2022-11-03: 1 g via INTRAVENOUS
  Filled 2022-11-03: qty 50

## 2022-11-03 MED ORDER — SIMVASTATIN 10 MG PO TABS
5.0000 mg | ORAL_TABLET | Freq: Every day | ORAL | Status: DC
  Administered 2022-11-03 – 2022-11-08 (×6): 5 mg via ORAL
  Filled 2022-11-03 (×6): qty 1

## 2022-11-03 MED ORDER — POLYETHYLENE GLYCOL 3350 17 G PO PACK: 17.0000 g | PACK | Freq: Every day | ORAL | Status: DC | PRN

## 2022-11-03 MED ORDER — ASPIRIN 81 MG PO TBEC
81.0000 mg | DELAYED_RELEASE_TABLET | Freq: Every day | ORAL | Status: DC
  Administered 2022-11-04 – 2022-11-09 (×6): 81 mg via ORAL
  Filled 2022-11-03 (×6): qty 1

## 2022-11-03 MED ORDER — FUROSEMIDE 10 MG/ML IJ SOLN
40.0000 mg | Freq: Two times a day (BID) | INTRAMUSCULAR | Status: DC
  Administered 2022-11-03 – 2022-11-06 (×6): 40 mg via INTRAVENOUS
  Filled 2022-11-03 (×6): qty 4

## 2022-11-03 MED ORDER — GARLIC 500 MG PO CAPS: 500.0000 mg | ORAL_CAPSULE | Freq: Every day | ORAL | Status: DC

## 2022-11-03 MED ORDER — ACETAMINOPHEN 325 MG PO TABS
650.0000 mg | ORAL_TABLET | Freq: Four times a day (QID) | ORAL | Status: DC | PRN
  Administered 2022-11-04 – 2022-11-09 (×7): 650 mg via ORAL
  Filled 2022-11-03 (×7): qty 2

## 2022-11-03 MED ORDER — ACETAMINOPHEN 650 MG RE SUPP: 650.0000 mg | Freq: Four times a day (QID) | RECTAL | Status: DC | PRN

## 2022-11-03 MED ORDER — ENOXAPARIN SODIUM 40 MG/0.4ML IJ SOSY
40.0000 mg | PREFILLED_SYRINGE | Freq: Every day | INTRAMUSCULAR | Status: DC
  Administered 2022-11-03 – 2022-11-07 (×5): 40 mg via SUBCUTANEOUS
  Filled 2022-11-03 (×5): qty 0.4

## 2022-11-03 MED ORDER — ONDANSETRON HCL 4 MG PO TABS: 4.0000 mg | ORAL_TABLET | Freq: Four times a day (QID) | ORAL | Status: DC | PRN

## 2022-11-03 MED ORDER — DOCUSATE SODIUM 100 MG PO CAPS
100.0000 mg | ORAL_CAPSULE | Freq: Two times a day (BID) | ORAL | Status: DC
  Administered 2022-11-03 – 2022-11-09 (×12): 100 mg via ORAL
  Filled 2022-11-03 (×12): qty 1

## 2022-11-03 MED ORDER — ONDANSETRON HCL 4 MG/2ML IJ SOLN: 4.0000 mg | Freq: Four times a day (QID) | INTRAMUSCULAR | Status: DC | PRN

## 2022-11-03 NOTE — ED Provider Notes (Signed)
[place] EMERGENCY DEPARTMENT AT [facility] Provider Note   CSN: [id number] Arrival date & time: 11/03/22  1352     History  Chief Complaint  Patient presents with   Leg Swelling    Kathleen Mejia is a 89 y.o. female.  HPI   Patient has a history of dementia, hypertension, hyperlipidemia, atrial fibrillation, anxiety, edema.  patient presents ED for evaluation of weakness leg swelling knee pain.  Patient has noticed increased swelling in both legs recently.  She is also had some fluid weeping from her legs.  She recently had a callus shaved on her left foot and has noted increasing redness and swelling after that.  She does not feel like that area has healed.  Patient is also having more pain and discomfort in left knee.  Today she was having increasing pain and her son tried to get her to to the doctor's office.  Patient was not able to stand because her legs gave out.  No known fevers.  No chest pain or shortness of breath. Home Medications Prior to Admission medications   Medication Sig Start Date End Date Taking? Authorizing Provider  Ascorbic Acid (VITAMIN C) 1000 MG tablet Take 1,000 mg by mouth daily.     [provider]  aspirin EC 81 MG tablet Take 81 mg by mouth daily. Swallow whole.    [provider]  bumetanide (BUMEX) 0.5 MG tablet TAKE 1 TABLET BY MOUTH DAILY WITH BREAKFAST 09/04/22   Plotnikov, Georgina Quint, MD  Cod Liver Oil 1000 MG CAPS Take 1,000 mg by mouth daily.     [provider]  Coenzyme Q10 (CO Q 10 PO) Take 250 mg by mouth daily.     [provider]  CRANBERRY PO Take 1 capsule by mouth daily.     [provider]  docusate sodium (COLACE) 100 MG capsule Take 1 capsule (100 mg total) by mouth 2 (two) times daily. 01/29/19   Romualdo Bolk, MD  donepezil (ARICEPT) 5 MG tablet TAKE 1 TABLET(5 MG) BY MOUTH AT BEDTIME 07/25/21   Plotnikov, Georgina Quint, MD  FLAXSEED, LINSEED, PO Take 0.5 Scoops by mouth  daily. PATIENT SPRINKLE FLAX SEEDS ONTO HER CEREAL DAILY    [provider]  Garlic 500 MG CAPS Take 500 mg by mouth daily.     [provider]  ketoconazole (NIZORAL) 2 % cream Apply 1 Application topically 2 (two) times daily. 08/07/22   Plotnikov, Georgina Quint, MD  ketoconazole (NIZORAL) 200 MG tablet TAKE 1 TABLET(200 MG) BY MOUTH DAILY 08/17/22   Plotnikov, Georgina Quint, MD  Multiple Minerals (MULTI-MINERALS PO) Take 1 tablet by mouth daily.    [provider]  polyethylene glycol (MIRALAX / GLYCOLAX) 17 g packet Take 17 g by mouth daily as needed for mild constipation. 01/29/19   Romualdo Bolk, MD  potassium chloride (KLOR-CON) 10 MEQ tablet Take 1 tablet (10 mEq total) by mouth daily. 08/07/22   Plotnikov, Georgina Quint, MD  simvastatin (ZOCOR) 5 MG tablet Take 1 tablet (5 mg total) by mouth at bedtime. 08/30/21   Plotnikov, Georgina Quint, MD  torsemide (DEMADEX) 10 MG tablet TAKE 1 TABLET(10 MG) BY MOUTH DAILY AS NEEDED 08/17/22   Plotnikov, Georgina Quint, MD  Zinc Sulfate (ZINC 15 PO) Take 50 mg by mouth daily.     [provider]      Allergies    Eliquis [apixaban], Losartan, Maxzide [hydrochlorothiazide w-triamterene], and Metoprolol    Review  of Systems   Review of Systems  Physical Exam Updated Vital Signs BP (!) 148/66   Pulse (!) 102   Temp 98 F (36.7 C) (Oral)   Resp 19   Ht 1.549 m (5\' 1" )   Wt 78 kg   SpO2 98%   BMI 32.49 kg/m  Physical Exam Vitals and nursing note reviewed.  Constitutional:      General: She is not in acute distress.    Appearance: She is well-developed.  HENT:     Head: Normocephalic and atraumatic.     Right Ear: External ear normal.     Left Ear: External ear normal.  Eyes:     General: No scleral icterus.       Right eye: No discharge.        Left eye: No discharge.     Conjunctiva/sclera: Conjunctivae normal.  Neck:     Trachea: No tracheal deviation.  Cardiovascular:     Rate and Rhythm: Normal rate and  regular rhythm.  Pulmonary:     Effort: Pulmonary effort is normal. No respiratory distress.     Breath sounds: Normal breath sounds. No stridor. No wheezing or rales.  Abdominal:     General: Bowel sounds are normal. There is no distension.     Palpations: Abdomen is soft.     Tenderness: There is no abdominal tenderness. There is no guarding or rebound.  Musculoskeletal:        General: No deformity.     Cervical back: Neck supple.     Left knee: Effusion present. Decreased range of motion. Tenderness present.     Comments: Left foot toes angulated overlapping, callus plantar aspect foot, edema bilateral lower extremities, left greater than right, erythema bilateral lower extremities  Skin:    General: Skin is warm and dry.     Findings: No rash.  Neurological:     General: No focal deficit present.     Mental Status: She is alert.     Cranial Nerves: No cranial nerve deficit, dysarthria or facial asymmetry.     Sensory: No sensory deficit.     Motor: No abnormal muscle tone or seizure activity.     Coordination: Coordination normal.  Psychiatric:        Mood and Affect: Mood normal.     ED Results / Procedures / Treatments   Labs (all labs ordered are listed, but only abnormal results are displayed) Labs Reviewed  CBC - Abnormal; Notable for the following components:      Result Value   WBC 10.6 (*)    Hemoglobin 15.7 (*)    HCT 47.7 (*)    All other components within normal limits  BASIC METABOLIC PANEL - Abnormal; Notable for the following components:   Glucose, Bld 117 (*)    All other components within normal limits  BRAIN NATRIURETIC PEPTIDE - Abnormal; Notable for the following components:   B Natriuretic Peptide 252.6 (*)    All other components within normal limits    EKG None  Radiology DG Chest Portable 1 View  Result Date: 11/03/2022 CLINICAL DATA:  weakness, swelling EXAM: PORTABLE CHEST - 1 VIEW COMPARISON:  04/06/2014 FINDINGS: Heart is mildly  enlarged.  No pulmonary vascular congestion. Lungs are clear. IMPRESSION: Mild cardiomegaly. Electronically Signed   By: Acquanetta Belling M.D.   On: 11/03/2022 15:59   DG Knee 2 Views Left  Result Date: 11/03/2022 CLINICAL DATA:  Increased leg swelling. Stood up at physician's appointment and  leg gave out. EXAM: LEFT KNEE - 1-2 VIEW COMPARISON:  None available FINDINGS: No fracture or dislocation. Soft tissues are unremarkable. Moderate tricompartmental osteoarthrosis. IMPRESSION: No acute abnormality of the left knee. Electronically Signed   By: Acquanetta Belling M.D.   On: 11/03/2022 15:58   VAS [place] LOWER EXTREMITY VENOUS (DVT) (7a-7p)  Result Date: 11/03/2022  Lower Venous DVT Study Patient Name:  Kathleen Mejia  Date of Exam:   11/03/2022 Medical Rec #: [id number]          Accession #:    [id number] Date of Birth: 04/10/1934          Patient Gender: F Patient Age:   10 years Exam Location:  [facility] Procedure:      VAS [place] LOWER EXTREMITY VENOUS (DVT) Referring Phys: Leva Baine --------------------------------------------------------------------------------  Indications: Swelling.  Risk Factors: None identified. Limitations: Body habitus and poor ultrasound/tissue interface. Comparison Study: No prior studies. Performing Technologist: Chanda Busing RVT  Examination Guidelines: A complete evaluation includes B-mode imaging, spectral Doppler, color Doppler, and power Doppler as needed of all accessible portions of each vessel. Bilateral testing is considered an integral part of a complete examination. Limited examinations for reoccurring indications may be performed as noted. The reflux portion of the exam is performed with the patient in reverse Trendelenburg.  +-----+---------------+---------+-----------+----------+--------------+ RIGHTCompressibilityPhasicitySpontaneityPropertiesThrombus Aging +-----+---------------+---------+-----------+----------+--------------+ CFV  Full           Yes       Yes                                 +-----+---------------+---------+-----------+----------+--------------+   +---------+---------------+---------+-----------+----------+-------------------+ LEFT     CompressibilityPhasicitySpontaneityPropertiesThrombus Aging      +---------+---------------+---------+-----------+----------+-------------------+ CFV      Full           Yes      Yes                                      +---------+---------------+---------+-----------+----------+-------------------+ SFJ      Full                                                             +---------+---------------+---------+-----------+----------+-------------------+ FV Prox  Full                                                             +---------+---------------+---------+-----------+----------+-------------------+ FV Mid   Full                                                             +---------+---------------+---------+-----------+----------+-------------------+ FV Distal               Yes      Yes                                      +---------+---------------+---------+-----------+----------+-------------------+  PFV      Full                                                             +---------+---------------+---------+-----------+----------+-------------------+ POP      Full           Yes      Yes                                      +---------+---------------+---------+-----------+----------+-------------------+ PTV      Full                                                             +---------+---------------+---------+-----------+----------+-------------------+ PERO                                                  Not well visualized +---------+---------------+---------+-----------+----------+-------------------+    Summary: RIGHT: - No evidence of common femoral vein obstruction.  LEFT: - There is no evidence of deep vein thrombosis in the  lower extremity. However, portions of this examination were limited- see technologist comments above.  - No cystic structure found in the popliteal fossa.  *See table(s) above for measurements and observations.    Preliminary     Procedures Procedures    Medications Ordered in ED Medications  ceFAZolin (ANCEF) IVPB 1 g/50 mL premix (1 g Intravenous New Bag/Given 11/03/22 1618)    ED Course/ Medical Decision Making/ A&P Clinical Course as of 11/03/22 1634  Fri Nov 03, 2022  1606 Doppler study negative for DVT [JK]  1606 CBC(!) Normal CBC.  Metabolic panel normal [JK]  1607 Brain natriuretic peptide(!) BNP slightly increased [JK]  1607 Chest x-ray without acute findings [JK]  1607 X-ray without acute abnormality [JK]  1633 D/w Dr Jacqulyn Bath regarding admission [JK]    Clinical Course User Index [JK] Linwood Dibbles, MD                             Medical Decision Making Problems Addressed: Cellulitis of left lower extremity: acute illness or injury that poses a threat to life or bodily functions Peripheral edema: acute illness or injury  Amount and/or Complexity of Data Reviewed Labs: ordered. Decision-making details documented in ED Course. Radiology: ordered and independent interpretation performed.  Risk Prescription drug management. Decision regarding hospitalization.   Patient presented to the ED for evaluation weakness increasing leg swelling, redness and pain.  Patient's exam is notable for asymmetric left lower extremity swelling.  Patient previously had a sharp dissection of a callus on her left foot.  Patient's white cell count is elevated but no signs of anemia.  Electrolyte panel does not show any signs of hypokalemia or hyponatremia.  No signs of acute renal failure.  BNP slightly elevated but no findings to suggest pulmonary edema.  Doppler study was performed and the  patient does not have a DVT.  I suspect her symptoms are related to a cellulitis.  I suspect she is  having some increasing weakness related to that infection and the pain and discomfort in her leg.  With her age and comorbidities I have started on IV antibiotics.  I will consult with the medical service for admission and further treatment        Final Clinical Impression(s) / ED Diagnoses Final diagnoses:  Cellulitis of left lower extremity  Peripheral edema    Rx / DC Orders ED Discharge Orders     None         Linwood Dibbles, MD 11/03/22 [phone]

## 2022-11-03 NOTE — H&P (Addendum)
History and Physical    Kathleen Mejia WUJ:[id number] DOB: November 10, 1933 DOA: 11/03/2022  PCP: Tresa Garter, MD  Patient coming from: Home  I have personally briefly reviewed patient's old medical records in [facility]  Chief Complaint: Generalized weakness  HPI: Kathleen Mejia is a 89 y.o. female with medical history significant of anxiety, Alzheimer's dementia, hypertension, severe mitral regurgitation, chronic systolic congestive heart failure, hyperlipidemia was brought into the emergency department by her son due to significant generalized weakness.  Per son who is at the bedside, patient was in her usual state of health until yesterday but this morning when he was getting ready to go see PCP, she was profoundly weak to the point that she could not even stand up even using the walker.  Typically she uses walker and walks as well.  Patient denies any fever or sweating but does endorse chills since yesterday.  Son noticed some swelling and redness bilateral lower extremities today.  She typically has some redness in bilateral lower extremities but this is worse.  Her swelling is also worse.  Son tells me that she was taking Demadex every other day but for past 3 to 4 weeks, he has been giving her every day but despite of that swelling is worse today.  Patient denies any chest pain, shortness of breath, palpitation, nausea, vomiting, any problem with urination or with bowel movements.  She lives with her son.  Of note, patient had a callus on the plantar surface of the left forefoot which was removed by her PCP about a month ago.  ED Course: Upon arrival to ED, she was hemodynamically stable.  CBC and BMP almost unremarkable.  BNP slightly elevated to 52 but no previous records available for comparison.  Review of Systems: As per HPI otherwise negative.    Past Medical History:  Diagnosis Date   Anxiety    Atrial fibrillation (HCC)    Cystocele with prolapse    Dislocated  shoulder 2012   right   Edema    left leg   Fatty liver    Gait disturbance    Gallstone    1.5 cm   Ganglion cyst    Glaucoma    HTN (hypertension)    Hyperlipidemia    LBP (low back pain)    Obesity    Osteoarthritis    Rosacea    Shortness of breath    with activities and exertion, pt denies 01/23/2019   Venous insufficiency of leg    Vertigo    not current 01/23/2019   Vitamin B 12 deficiency     Past Surgical History:  Procedure Laterality Date   ABDOMINAL HYSTERECTOMY     CHOLECYSTECTOMY N/A 03/27/2014   Procedure: LAPAROSCOPIC CHOLECYSTECTOMY WITH INTRAOPERATIVE CHOLANGIOGRAM;  Surgeon: Claud Kelp, MD;  Location: [facility] OR;  Service: General;  Laterality: N/A;   CYSTOCELE REPAIR N/A 01/28/2019   Procedure: ANTERIOR REPAIR (CYSTOCELE);  Surgeon: Romualdo Bolk, MD;  Location[facility];  Service: Gynecology;  Laterality: N/A;   CYSTOSCOPY N/A 01/28/2019   Procedure: CYSTOSCOPY;  Surgeon: Romualdo Bolk, MD;  Location: [facility];  Service: Gynecology;  Laterality: N/A;   TONSILLECTOMY     VAGINAL HYSTERECTOMY Left 01/28/2019   Procedure: HYSTERECTOMY VAGINAL WITH LEFT SALPINGECTOMY;  Surgeon: Romualdo Bolk, MD;  Location: [facility];  Service: Gynecology;  Laterality: Left;     reports that she has never smoked. She has never used smokeless tobacco.  She reports that she does not drink alcohol and does not use drugs.  Allergies  Allergen Reactions   Eliquis [Apixaban]     HAs, achy   Losartan     "all side effects and an article from [employer] re Valsartan recall"   Maxzide [Hydrochlorothiazide W-Triamterene]     Cramps w/a high dose   Metoprolol     achy    Family History  Problem Relation Age of Onset   Pancreatic cancer Mother    Hypertension Mother    Breast cancer Mother    Kidney failure Sister     Prior to Admission medications   Medication Sig Start Date End Date Taking? Authorizing  Provider  Ascorbic Acid (VITAMIN C) 1000 MG tablet Take 1,000 mg by mouth daily.     [provider]  aspirin EC 81 MG tablet Take 81 mg by mouth daily. Swallow whole.    [provider]  bumetanide (BUMEX) 0.5 MG tablet TAKE 1 TABLET BY MOUTH DAILY WITH BREAKFAST 09/04/22   Plotnikov, Georgina Quint, MD  Cod Liver Oil 1000 MG CAPS Take 1,000 mg by mouth daily.     [provider]  Coenzyme Q10 (CO Q 10 PO) Take 250 mg by mouth daily.     [provider]  CRANBERRY PO Take 1 capsule by mouth daily.     [provider]  docusate sodium (COLACE) 100 MG capsule Take 1 capsule (100 mg total) by mouth 2 (two) times daily. 01/29/19   Romualdo Bolk, MD  donepezil (ARICEPT) 5 MG tablet TAKE 1 TABLET(5 MG) BY MOUTH AT BEDTIME 07/25/21   Plotnikov, Georgina Quint, MD  FLAXSEED, LINSEED, PO Take 0.5 Scoops by mouth daily. PATIENT SPRINKLE FLAX SEEDS ONTO HER CEREAL DAILY    [provider]  Garlic 500 MG CAPS Take 500 mg by mouth daily.     [provider]  ketoconazole (NIZORAL) 2 % cream Apply 1 Application topically 2 (two) times daily. 08/07/22   Plotnikov, Georgina Quint, MD  ketoconazole (NIZORAL) 200 MG tablet TAKE 1 TABLET(200 MG) BY MOUTH DAILY 08/17/22   Plotnikov, Georgina Quint, MD  Multiple Minerals (MULTI-MINERALS PO) Take 1 tablet by mouth daily.    [provider]  polyethylene glycol (MIRALAX / GLYCOLAX) 17 g packet Take 17 g by mouth daily as needed for mild constipation. 01/29/19   Romualdo Bolk, MD  potassium chloride (KLOR-CON) 10 MEQ tablet Take 1 tablet (10 mEq total) by mouth daily. 08/07/22   Plotnikov, Georgina Quint, MD  simvastatin (ZOCOR) 5 MG tablet Take 1 tablet (5 mg total) by mouth at bedtime. 08/30/21   Plotnikov, Georgina Quint, MD  torsemide (DEMADEX) 10 MG tablet TAKE 1 TABLET(10 MG) BY MOUTH DAILY AS NEEDED 08/17/22   Plotnikov, Georgina Quint, MD  Zinc Sulfate (ZINC 15 PO) Take 50 mg by mouth daily.     [provider]     Physical Exam: Vitals:   11/03/22 1400 11/03/22 1402  BP:  (!) 148/66  Pulse:  (!) 102  Resp:  19  Temp:  98 F (36.7 C)  TempSrc:  Oral  SpO2:  98%  Weight: 78 kg   Height: 5\' 1"  (1.549 m)     Constitutional: NAD, calm, comfortable Vitals:   11/03/22 1400 11/03/22 1402  BP:  (!) 148/66  Pulse:  (!) 102  Resp:  19  Temp:  98 F (36.7 C)  TempSrc:  Oral  SpO2:  98%  Weight: 78 kg  Height: 5\' 1"  (1.549 m)    Eyes: PERRL, lids and conjunctivae normal ENMT: Mucous membranes are moist. Posterior pharynx clear of any exudate or lesions.Normal dentition.  Neck: normal, supple, no masses, no thyromegaly Respiratory: clear to auscultation bilaterally, no wheezing, no crackles. Normal respiratory effort. No accessory muscle use.  Cardiovascular: Regular rate and rhythm, no murmurs / rubs / gallops. No extremity edema. 2+ pedal pulses. No carotid bruits.  Abdomen: no tenderness, no masses palpated. No hepatosplenomegaly. Bowel sounds positive.  Musculoskeletal: no clubbing / cyanosis. No joint deformity upper and lower extremities. Good ROM, no contractures. Normal muscle tone.  Skin: Confluent erythema bilateral lower extremity along with weeping, shallow ulcer at the right lateral lower leg, slightly warm and tender to touch.  Pictures below. Neurologic: CN 2-12 grossly intact. Sensation intact, DTR normal. Strength 5/5 in all 4.  Psychiatric: Normal judgment and insight. Alert and oriented x 3. Normal mood.         Labs on Admission: I have personally reviewed following labs and imaging studies  CBC: Recent Labs  Lab 11/03/22 1505  WBC 10.6*  HGB 15.7*  HCT 47.7*  MCV 98.1  PLT 238   Basic Metabolic Panel: Recent Labs  Lab 11/03/22 1505  NA 138  K 3.8  CL 99  CO2 30  GLUCOSE 117*  BUN 23  CREATININE 0.71  CALCIUM 9.7   GFR: Estimated Creatinine Clearance: 46.8 mL/min (by C-G formula based on SCr of 0.71 mg/dL). Liver Function Tests: No results  for input(s): "AST", "ALT", "ALKPHOS", "BILITOT", "PROT", "ALBUMIN" in the last 168 hours. No results for input(s): "LIPASE", "AMYLASE" in the last 168 hours. No results for input(s): "AMMONIA" in the last 168 hours. Coagulation Profile: No results for input(s): "INR", "PROTIME" in the last 168 hours. Cardiac Enzymes: No results for input(s): "CKTOTAL", "CKMB", "CKMBINDEX", "TROPONINI" in the last 168 hours. BNP (last 3 results) No results for input(s): "PROBNP" in the last 8760 hours. HbA1C: No results for input(s): "HGBA1C" in the last 72 hours. CBG: No results for input(s): "GLUCAP" in the last 168 hours. Lipid Profile: No results for input(s): "CHOL", "HDL", "LDLCALC", "TRIG", "CHOLHDL", "LDLDIRECT" in the last 72 hours. Thyroid Function Tests: No results for input(s): "TSH", "T4TOTAL", "FREET4", "T3FREE", "THYROIDAB" in the last 72 hours. Anemia Panel: No results for input(s): "VITAMINB12", "FOLATE", "FERRITIN", "TIBC", "IRON", "RETICCTPCT" in the last 72 hours. Urine analysis:    Component Value Date/Time   COLORURINE YELLOW 08/17/2021 1554   APPEARANCEUR CLEAR 08/17/2021 1554   LABSPEC 1.020 08/17/2021 1554   PHURINE 7.0 08/17/2021 1554   GLUCOSEU NEGATIVE 08/17/2021 1554   HGBUR NEGATIVE 08/17/2021 1554   BILIRUBINUR NEGATIVE 08/17/2021 1554   BILIRUBINUR Negative 12/04/2018 1512   KETONESUR NEGATIVE 08/17/2021 1554   PROTEINUR 30 (A) 02/04/2019 2020   UROBILINOGEN 0.2 08/17/2021 1554   NITRITE POSITIVE (A) 08/17/2021 1554   LEUKOCYTESUR NEGATIVE 08/17/2021 1554    Radiological Exams on Admission: VAS [place] LOWER EXTREMITY VENOUS (DVT) (7a-7p)  Result Date: 11/03/2022  Lower Venous DVT Study Patient Name:  ARHONDA VANHUSS  Date of Exam:   11/03/2022 Medical Rec #: [id number]          Accession #:    [id number] Date of Birth: Aug 07, 1933          Patient Gender: F Patient Age:   49 years Exam Location:  [facility] Procedure:      VAS [place] LOWER EXTREMITY VENOUS (DVT)  Referring Phys: Cletis Athens KNAPP --------------------------------------------------------------------------------  Indications:  Swelling.  Risk Factors: None identified. Limitations: Body habitus and poor ultrasound/tissue interface. Comparison Study: No prior studies. Performing Technologist: Chanda Busing RVT  Examination Guidelines: A complete evaluation includes B-mode imaging, spectral Doppler, color Doppler, and power Doppler as needed of all accessible portions of each vessel. Bilateral testing is considered an integral part of a complete examination. Limited examinations for reoccurring indications may be performed as noted. The reflux portion of the exam is performed with the patient in reverse Trendelenburg.  +-----+---------------+---------+-----------+----------+--------------+ RIGHTCompressibilityPhasicitySpontaneityPropertiesThrombus Aging +-----+---------------+---------+-----------+----------+--------------+ CFV  Full           Yes      Yes                                 +-----+---------------+---------+-----------+----------+--------------+   +---------+---------------+---------+-----------+----------+-------------------+ LEFT     CompressibilityPhasicitySpontaneityPropertiesThrombus Aging      +---------+---------------+---------+-----------+----------+-------------------+ CFV      Full           Yes      Yes                                      +---------+---------------+---------+-----------+----------+-------------------+ SFJ      Full                                                             +---------+---------------+---------+-----------+----------+-------------------+ FV Prox  Full                                                             +---------+---------------+---------+-----------+----------+-------------------+ FV Mid   Full                                                              +---------+---------------+---------+-----------+----------+-------------------+ FV Distal               Yes      Yes                                      +---------+---------------+---------+-----------+----------+-------------------+ PFV      Full                                                             +---------+---------------+---------+-----------+----------+-------------------+ POP      Full           Yes      Yes                                      +---------+---------------+---------+-----------+----------+-------------------+  PTV      Full                                                             +---------+---------------+---------+-----------+----------+-------------------+ PERO                                                  Not well visualized +---------+---------------+---------+-----------+----------+-------------------+    Summary: RIGHT: - No evidence of common femoral vein obstruction.  LEFT: - There is no evidence of deep vein thrombosis in the lower extremity. However, portions of this examination were limited- see technologist comments above.  - No cystic structure found in the popliteal fossa.  *See table(s) above for measurements and observations. Electronically signed by Sherald Hess MD on 11/03/2022 at 4:40:29 PM.    Final    DG Chest Portable 1 View  Result Date: 11/03/2022 CLINICAL DATA:  weakness, swelling EXAM: PORTABLE CHEST - 1 VIEW COMPARISON:  04/06/2014 FINDINGS: Heart is mildly enlarged.  No pulmonary vascular congestion. Lungs are clear. IMPRESSION: Mild cardiomegaly. Electronically Signed   By: Acquanetta Belling M.D.   On: 11/03/2022 15:59   DG Knee 2 Views Left  Result Date: 11/03/2022 CLINICAL DATA:  Increased leg swelling. Stood up at physician's appointment and leg gave out. EXAM: LEFT KNEE - 1-2 VIEW COMPARISON:  None available FINDINGS: No fracture or dislocation. Soft tissues are unremarkable. Moderate tricompartmental  osteoarthrosis. IMPRESSION: No acute abnormality of the left knee. Electronically Signed   By: Acquanetta Belling M.D.   On: 11/03/2022 15:58     Assessment/Plan Principal Problem:   Generalized weakness Active Problems:   Essential hypertension   Edema   Alzheimer disease (HCC)   Lower extremity cellulitis   Severe mitral regurgitation   Bilateral lower extremity cellulitis: Patient denies any trauma but she has a shallow ulcer at the lateral side of the right lower leg.  Her callus which was removed by her PCP a week ago does not seem to be infected.  However she does seem to have clear signs of bilateral lower extremity cellulitis.  She has received cefazolin in the ED.  I will continue her on Rocephin.  Will order blood culture.  Doppler lower extremity is already done and negative for DVT.  Generalized weakness: Likely secondary to infection.  Will consult PT OT.  Hyperlipidemia: Resume statin.  Acute on chronic systolic and diastolic congestive heart failure/bilateral lower extremity edema: Significant edema bilateral lower extremity.  She takes torsemide at home.  BNP slightly elevated but no comparison available.  Will start her on Lasix 40 mg IV twice daily for now and reassess tomorrow.  Will order transthoracic echo.  Alzheimer's dementia: It appears that patient is supposed to take Aricept but she refuses to take it.  DVT prophylaxis: enoxaparin (LOVENOX) injection 40 mg Start: 11/03/22 2200 Code Status: DNR Family Communication: Son present at bedside.  Plan of care discussed with patient in length and he verbalized understanding and agreed with it. Disposition Plan: Based on the clinical progress. Consults called: None  Hughie Closs MD [facility]  *Please note that this is a verbal dictation therefore any spelling or grammatical errors are  due to the [employer]" system interpretation.  Please page via Amion and do not message via secure chat for urgent  patient care matters. Secure chat can be used for non urgent patient care matters. 11/03/2022, 4:56 PM  To contact the attending provider between 7A-7P or the covering provider during after hours 7P-7A, please log into the web site [email or web address]

## 2022-11-03 NOTE — Progress Notes (Signed)
Left lower extremity venous duplex has been completed. Preliminary results can be found in CV Proc through chart review.  Results were given to Dr. Lynelle Doctor.  11/03/22 3:51 PM Olen Cordial RVT

## 2022-11-03 NOTE — Progress Notes (Signed)
PHARMACIST - PHYSICIAN ORDER COMMUNICATION  CONCERNING: P&T Medication Policy on Herbal Medications  DESCRIPTION:  This patient's order for:  Garlic  has been noted.  This product(s) is classified as an "herbal" or natural product. Due to a lack of definitive safety studies or FDA approval, nonstandard manufacturing practices, plus the potential risk of unknown drug-drug interactions while on inpatient medications, the Pharmacy and Therapeutics Committee does not permit the use of "herbal" or natural products of this type within [facility].   ACTION TAKEN: The pharmacy department is unable to verify this order at this time and your patient has been informed of this safety policy. Please reevaluate patient's clinical condition at discharge and address if the herbal or natural product(s) should be resumed at that time.  Jezel Basto S. Merilynn Finland, PharmD, BCPS Clinical Staff Pharmacist [email or web address]

## 2022-11-03 NOTE — ED Triage Notes (Addendum)
Patient BIB [facility] from home. Increased swelling in both legs. Takes a diuretic. Said when she stood up for her doctors appointment her legs gave out on her. Has dementia and a fib.

## 2022-11-03 NOTE — ED Notes (Signed)
ED TO INPATIENT HANDOFF REPORT  ED Nurse Name and Phone #: Dessa Phi  S Name/Age/Gender Kathleen Mejia 89 y.o. female Room/Bed: WA14/WA14  Code Status   Code Status: DNR  Home/SNF/Other Home Patient oriented to: self and place Is this baseline? Yes   Triage Complete: Triage complete  Chief Complaint Generalized weakness [R53.1]  Triage Note Patient BIB [facility] from home. Increased swelling in both legs. Takes a diuretic. Said when she stood up for her doctors appointment her legs gave out on her. Has dementia and a fib.   Allergies Allergies  Allergen Reactions   Eliquis [Apixaban]     HAs, achy   Losartan     "all side effects and an article from [employer] re Valsartan recall"   Maxzide [Hydrochlorothiazide W-Triamterene]     Cramps w/a high dose   Metoprolol     achy    Level of Care/Admitting Diagnosis ED Disposition     ED Disposition  Admit   Condition  --   Comment  Hospital Area: [facility] [[id number]]  Level of Care: Med-Surg [16]  May place patient in observation at [facility] or [facility] if equivalent level of care is available:: No  Covid Evaluation: Asymptomatic - no recent exposure (last 10 days) testing not required  Diagnosis: Generalized weakness [161096]  Admitting Physician: Hughie Closs [[id number]]  Attending Physician: Hughie Closs [[id number]]          B Medical/Surgery History Past Medical History:  Diagnosis Date   Anxiety    Atrial fibrillation (HCC)    Cystocele with prolapse    Dislocated shoulder 2012   right   Edema    left leg   Fatty liver    Gait disturbance    Gallstone    1.5 cm   Ganglion cyst    Glaucoma    HTN (hypertension)    Hyperlipidemia    LBP (low back pain)    Obesity    Osteoarthritis    Rosacea    Shortness of breath    with activities and exertion, pt denies 01/23/2019   Venous insufficiency of leg    Vertigo    not current 01/23/2019   Vitamin B 12 deficiency     Past Surgical History:  Procedure Laterality Date   ABDOMINAL HYSTERECTOMY     CHOLECYSTECTOMY N/A 03/27/2014   Procedure: LAPAROSCOPIC CHOLECYSTECTOMY WITH INTRAOPERATIVE CHOLANGIOGRAM;  Surgeon: Claud Kelp, MD;  Location: [facility] OR;  Service: General;  Laterality: N/A;   CYSTOCELE REPAIR N/A 01/28/2019   Procedure: ANTERIOR REPAIR (CYSTOCELE);  Surgeon: Romualdo Bolk, MD;  Location: [facility];  Service: Gynecology;  Laterality: N/A;   CYSTOSCOPY N/A 01/28/2019   Procedure: CYSTOSCOPY;  Surgeon: Romualdo Bolk, MD;  Location: [facility];  Service: Gynecology;  Laterality: N/A;   TONSILLECTOMY     VAGINAL HYSTERECTOMY Left 01/28/2019   Procedure: HYSTERECTOMY VAGINAL WITH LEFT SALPINGECTOMY;  Surgeon: Romualdo Bolk, MD;  Location: [facility];  Service: Gynecology;  Laterality: Left;     A IV Location/Drains/Wounds Patient Lines/Drains/Airways Status     Active Line/Drains/Airways     Name Placement date Placement time Site Days   Peripheral IV 11/03/22 20 G 1.75" Left Antecubital 11/03/22  1459  Antecubital  less than 1   Urethral Catheter Adan Sis RN Straight-tip 16 Fr. 01/29/19  1400  Straight-tip  1374   Incision (Closed) 01/28/19 Vagina Other (Comment) 01/28/19  1219  -- 1375  Incision - 4 Ports Abdomen 1: Umbilicus 2: Mid;Upper 3: Right;Medial 4: Right;Lateral 03/27/14  --  -- 3143            Intake/Output Last 24 hours No intake or output data in the 24 hours ending 11/03/22 1726  Labs/Imaging Results for orders placed or performed during the hospital encounter of 11/03/22 (from the past 48 hour(s))  CBC     Status: Abnormal   Collection Time: 11/03/22  3:05 PM  Result Value Ref Range   WBC 10.6 (H) 4.0 - 10.5 K/uL   RBC 4.86 3.87 - 5.11 MIL/uL   Hemoglobin 15.7 (H) 12.0 - 15.0 g/dL   HCT 98.1 (H) 19.1 - 47.8 %   MCV 98.1 80.0 - 100.0 fL   MCH 32.3 26.0 - 34.0 pg   MCHC 32.9 30.0 - 36.0  g/dL   RDW 29.5 62.1 - 30.8 %   Platelets 238 150 - 400 K/uL   nRBC 0.0 0.0 - 0.2 %    Comment: Performed at [facility], [address], [place], [place] [address]  Basic metabolic panel     Status: Abnormal   Collection Time: 11/03/22  3:05 PM  Result Value Ref Range   Sodium 138 135 - 145 mmol/L   Potassium 3.8 3.5 - 5.1 mmol/L   Chloride 99 98 - 111 mmol/L   CO2 30 22 - 32 mmol/L   Glucose, Bld 117 (H) 70 - 99 mg/dL    Comment: Glucose reference range applies only to samples taken after fasting for at least 8 hours.   BUN 23 8 - 23 mg/dL   Creatinine, Ser 6.96 0.44 - 1.00 mg/dL   Calcium 9.7 8.9 - 29.5 mg/dL   GFR, Estimated >28 >41 mL/min    Comment: (NOTE) Calculated using the CKD-EPI Creatinine Equation (2021)    Anion gap 9 5 - 15    Comment: Performed at [facility], [address], [place], [place] [address]  Brain natriuretic peptide     Status: Abnormal   Collection Time: 11/03/22  3:05 PM  Result Value Ref Range   B Natriuretic Peptide 252.6 (H) 0.0 - 100.0 pg/mL    Comment: Performed at [facility], [address], [place], [place] [address]   VAS [place] LOWER EXTREMITY VENOUS (DVT) (7a-7p)  Result Date: 11/03/2022  Lower Venous DVT Study Patient Name:  Kathleen Mejia  Date of Exam:   11/03/2022 Medical Rec #: [id number]          Accession #:    [id number] Date of Birth: 1934-04-02          Patient Gender: F Patient Age:   39 years Exam Location:  [facility] Procedure:      VAS [place] LOWER EXTREMITY VENOUS (DVT) Referring Phys: JON KNAPP --------------------------------------------------------------------------------  Indications: Swelling.  Risk Factors: None identified. Limitations: Body habitus and poor ultrasound/tissue interface. Comparison Study: No prior studies. Performing Technologist: Chanda Busing RVT  Examination Guidelines: A complete evaluation includes B-mode imaging, spectral Doppler, color  Doppler, and power Doppler as needed of all accessible portions of each vessel. Bilateral testing is considered an integral part of a complete examination. Limited examinations for reoccurring indications may be performed as noted. The reflux portion of the exam is performed with the patient in reverse Trendelenburg.  +-----+---------------+---------+-----------+----------+--------------+ RIGHTCompressibilityPhasicitySpontaneityPropertiesThrombus Aging +-----+---------------+---------+-----------+----------+--------------+ CFV  Full           Yes      Yes                                 +-----+---------------+---------+-----------+----------+--------------+   +---------+---------------+---------+-----------+----------+-------------------+  LEFT     CompressibilityPhasicitySpontaneityPropertiesThrombus Aging      +---------+---------------+---------+-----------+----------+-------------------+ CFV      Full           Yes      Yes                                      +---------+---------------+---------+-----------+----------+-------------------+ SFJ      Full                                                             +---------+---------------+---------+-----------+----------+-------------------+ FV Prox  Full                                                             +---------+---------------+---------+-----------+----------+-------------------+ FV Mid   Full                                                             +---------+---------------+---------+-----------+----------+-------------------+ FV Distal               Yes      Yes                                      +---------+---------------+---------+-----------+----------+-------------------+ PFV      Full                                                             +---------+---------------+---------+-----------+----------+-------------------+ POP      Full           Yes      Yes                                       +---------+---------------+---------+-----------+----------+-------------------+ PTV      Full                                                             +---------+---------------+---------+-----------+----------+-------------------+ PERO                                                  Not well visualized +---------+---------------+---------+-----------+----------+-------------------+    Summary: RIGHT: - No evidence of common femoral vein  obstruction.  LEFT: - There is no evidence of deep vein thrombosis in the lower extremity. However, portions of this examination were limited- see technologist comments above.  - No cystic structure found in the popliteal fossa.  *See table(s) above for measurements and observations. Electronically signed by Sherald Hess MD on 11/03/2022 at 4:40:29 PM.    Final    DG Chest Portable 1 View  Result Date: 11/03/2022 CLINICAL DATA:  weakness, swelling EXAM: PORTABLE CHEST - 1 VIEW COMPARISON:  04/06/2014 FINDINGS: Heart is mildly enlarged.  No pulmonary vascular congestion. Lungs are clear. IMPRESSION: Mild cardiomegaly. Electronically Signed   By: Acquanetta Belling M.D.   On: 11/03/2022 15:59   DG Knee 2 Views Left  Result Date: 11/03/2022 CLINICAL DATA:  Increased leg swelling. Stood up at physician's appointment and leg gave out. EXAM: LEFT KNEE - 1-2 VIEW COMPARISON:  None available FINDINGS: No fracture or dislocation. Soft tissues are unremarkable. Moderate tricompartmental osteoarthrosis. IMPRESSION: No acute abnormality of the left knee. Electronically Signed   By: Acquanetta Belling M.D.   On: 11/03/2022 15:58    Pending Labs Unresulted Labs (From admission, onward)     Start     Ordered   11/10/22 0500  Creatinine, serum  (enoxaparin (LOVENOX)    CrCl >/= 30 ml/min)  Weekly,   R     Comments: while on enoxaparin therapy    11/03/22 1653   11/04/22 0500  Comprehensive metabolic panel  Tomorrow morning,   R         11/03/22 1653   11/04/22 0500  CBC  Tomorrow morning,   R        11/03/22 1653   11/03/22 1701  Culture, blood (Routine X 2) w Reflex to ID Panel  BLOOD CULTURE X 2,   R (with TIMED occurrences)      11/03/22 1700   11/03/22 1654  TSH  Once,   R        11/03/22 1653   11/03/22 1654  Hemoglobin A1c  Once,   R        11/03/22 1653   11/03/22 1653  CBC  (enoxaparin (LOVENOX)    CrCl >/= 30 ml/min)  Once,   R       Comments: Baseline for enoxaparin therapy IF NOT ALREADY DRAWN.  Notify MD if PLT < 100 K.    11/03/22 1653   11/03/22 1653  Creatinine, serum  (enoxaparin (LOVENOX)    CrCl >/= 30 ml/min)  Once,   R       Comments: Baseline for enoxaparin therapy IF NOT ALREADY DRAWN.    11/03/22 1653   11/03/22 1653  Magnesium  Once,   R        11/03/22 1653            Vitals/Pain Today's Vitals   11/03/22 1400 11/03/22 1402  BP:  (!) 148/66  Pulse:  (!) 102  Resp:  19  Temp:  98 F (36.7 C)  TempSrc:  Oral  SpO2:  98%  Weight: 78 kg   Height: 5\' 1"  (1.549 m)   PainSc: 0-No pain     Isolation Precautions No active isolations  Medications Medications  aspirin EC tablet 81 mg (has no administration in time range)  simvastatin (ZOCOR) tablet 5 mg (has no administration in time range)  docusate sodium (COLACE) capsule 100 mg (has no administration in time range)  polyethylene glycol (MIRALAX / GLYCOLAX) packet 17 g (has no administration in time  range)  cefTRIAXone (ROCEPHIN) 1 g in sodium chloride 0.9 % 100 mL IVPB (1 g Intravenous New Bag/Given 11/03/22 1721)  enoxaparin (LOVENOX) injection 40 mg (has no administration in time range)  acetaminophen (TYLENOL) tablet 650 mg (has no administration in time range)    Or  acetaminophen (TYLENOL) suppository 650 mg (has no administration in time range)  ondansetron (ZOFRAN) tablet 4 mg (has no administration in time range)    Or  ondansetron (ZOFRAN) injection 4 mg (has no administration in time range)  furosemide (LASIX)  injection 40 mg (40 mg Intravenous Given 11/03/22 1716)  ceFAZolin (ANCEF) IVPB 1 g/50 mL premix (0 g Intravenous Stopped 11/03/22 1656)    Mobility non-ambulatory     Focused Assessments Cardiac Assessment Handoff:    Lab Results  Component Value Date   CKTOTAL 250 (H) 01/30/2014   CKMB 3.0 01/30/2014   No results found for: "DDIMER" Does the Patient currently have chest pain? No    R Recommendations: See Admitting Provider Note  Report given to:   Additional Notes:

## 2022-11-04 ENCOUNTER — Observation Stay (HOSPITAL_COMMUNITY): Payer: Medicare Other

## 2022-11-04 DIAGNOSIS — Z6832 Body mass index (BMI) 32.0-32.9, adult: Secondary | ICD-10-CM | POA: Diagnosis not present

## 2022-11-04 DIAGNOSIS — E669 Obesity, unspecified: Secondary | ICD-10-CM | POA: Diagnosis present

## 2022-11-04 DIAGNOSIS — E785 Hyperlipidemia, unspecified: Secondary | ICD-10-CM | POA: Diagnosis present

## 2022-11-04 DIAGNOSIS — E876 Hypokalemia: Secondary | ICD-10-CM | POA: Diagnosis not present

## 2022-11-04 DIAGNOSIS — L0390 Cellulitis, unspecified: Secondary | ICD-10-CM | POA: Diagnosis present

## 2022-11-04 DIAGNOSIS — F05 Delirium due to known physiological condition: Secondary | ICD-10-CM | POA: Diagnosis present

## 2022-11-04 DIAGNOSIS — I110 Hypertensive heart disease with heart failure: Secondary | ICD-10-CM | POA: Diagnosis present

## 2022-11-04 DIAGNOSIS — I5021 Acute systolic (congestive) heart failure: Secondary | ICD-10-CM | POA: Diagnosis not present

## 2022-11-04 DIAGNOSIS — I051 Rheumatic mitral insufficiency: Secondary | ICD-10-CM | POA: Diagnosis present

## 2022-11-04 DIAGNOSIS — I480 Paroxysmal atrial fibrillation: Secondary | ICD-10-CM | POA: Diagnosis not present

## 2022-11-04 DIAGNOSIS — L03116 Cellulitis of left lower limb: Secondary | ICD-10-CM | POA: Diagnosis present

## 2022-11-04 DIAGNOSIS — M170 Bilateral primary osteoarthritis of knee: Secondary | ICD-10-CM | POA: Diagnosis present

## 2022-11-04 DIAGNOSIS — K760 Fatty (change of) liver, not elsewhere classified: Secondary | ICD-10-CM | POA: Diagnosis present

## 2022-11-04 DIAGNOSIS — Z7982 Long term (current) use of aspirin: Secondary | ICD-10-CM | POA: Diagnosis not present

## 2022-11-04 DIAGNOSIS — G309 Alzheimer's disease, unspecified: Secondary | ICD-10-CM | POA: Diagnosis present

## 2022-11-04 DIAGNOSIS — H409 Unspecified glaucoma: Secondary | ICD-10-CM | POA: Diagnosis present

## 2022-11-04 DIAGNOSIS — R531 Weakness: Secondary | ICD-10-CM | POA: Diagnosis not present

## 2022-11-04 DIAGNOSIS — I5043 Acute on chronic combined systolic (congestive) and diastolic (congestive) heart failure: Secondary | ICD-10-CM | POA: Diagnosis present

## 2022-11-04 DIAGNOSIS — F0280 Dementia in other diseases classified elsewhere without behavioral disturbance: Secondary | ICD-10-CM | POA: Diagnosis present

## 2022-11-04 DIAGNOSIS — Z9049 Acquired absence of other specified parts of digestive tract: Secondary | ICD-10-CM | POA: Diagnosis not present

## 2022-11-04 DIAGNOSIS — Z800 Family history of malignant neoplasm of digestive organs: Secondary | ICD-10-CM | POA: Diagnosis not present

## 2022-11-04 DIAGNOSIS — Z8249 Family history of ischemic heart disease and other diseases of the circulatory system: Secondary | ICD-10-CM | POA: Diagnosis not present

## 2022-11-04 DIAGNOSIS — R600 Localized edema: Secondary | ICD-10-CM | POA: Diagnosis present

## 2022-11-04 DIAGNOSIS — Z66 Do not resuscitate: Secondary | ICD-10-CM | POA: Diagnosis present

## 2022-11-04 DIAGNOSIS — Z803 Family history of malignant neoplasm of breast: Secondary | ICD-10-CM | POA: Diagnosis not present

## 2022-11-04 DIAGNOSIS — M1990 Unspecified osteoarthritis, unspecified site: Secondary | ICD-10-CM | POA: Diagnosis present

## 2022-11-04 DIAGNOSIS — L03115 Cellulitis of right lower limb: Secondary | ICD-10-CM | POA: Diagnosis present

## 2022-11-04 DIAGNOSIS — T502X5A Adverse effect of carbonic-anhydrase inhibitors, benzothiadiazides and other diuretics, initial encounter: Secondary | ICD-10-CM | POA: Diagnosis not present

## 2022-11-04 LAB — COMPREHENSIVE METABOLIC PANEL
ALT: 14 U/L (ref 0–44)
AST: 20 U/L (ref 15–41)
Albumin: 3.6 g/dL (ref 3.5–5.0)
Alkaline Phosphatase: 68 U/L (ref 38–126)
Anion gap: 11 (ref 5–15)
BUN: 17 mg/dL (ref 8–23)
CO2: 28 mmol/L (ref 22–32)
Calcium: 8.7 mg/dL — ABNORMAL LOW (ref 8.9–10.3)
Chloride: 98 mmol/L (ref 98–111)
Creatinine, Ser: 0.78 mg/dL (ref 0.44–1.00)
GFR, Estimated: >60 mL/min (ref >60)
Glucose, Bld: 121 mg/dL — ABNORMAL HIGH (ref 70–99)
Potassium: 3.1 mmol/L — ABNORMAL LOW (ref 3.5–5.1)
Sodium: 137 mmol/L (ref 135–145)
Total Bilirubin: 1.4 mg/dL — ABNORMAL HIGH (ref 0.3–1.2)
Total Protein: 6.5 g/dL (ref 6.5–8.1)

## 2022-11-04 LAB — ECHOCARDIOGRAM COMPLETE
AR max vel: 2.04 cm2
AV Area VTI: 1.95 cm2
AV Area mean vel: 2.05 cm2
AV Mean grad: 5 mmHg
AV Peak grad: 9.5 mmHg
AV Vena cont: 0.4 cm
Ao pk vel: 1.54 m/s
Area-P 1/2: 3.56 cm2
Calc EF: 41.9 %
Height: 61 in
MV M vel: 4.19 m/s
MV Peak grad: 70.2 mmHg
P 1/2 time: 696 msec
S' Lateral: 4.3 cm
Single Plane A2C EF: 38.8 %
Single Plane A4C EF: 43.8 %
Weight: 2747.81 oz

## 2022-11-04 LAB — TSH: TSH: 1.436 u[IU]/mL (ref 0.350–4.500)

## 2022-11-04 LAB — CREATININE, SERUM
Creatinine, Ser: 0.65 mg/dL (ref 0.44–1.00)
GFR, Estimated: >60 mL/min (ref >60)

## 2022-11-04 LAB — MAGNESIUM: Magnesium: 2.2 mg/dL (ref 1.7–2.4)

## 2022-11-04 LAB — CBC
HCT: 45.6 % (ref 36.0–46.0)
Hemoglobin: 15.2 g/dL — ABNORMAL HIGH (ref 12.0–15.0)
MCH: 33 pg (ref 26.0–34.0)
MCHC: 33.3 g/dL (ref 30.0–36.0)
MCV: 99.1 fL (ref 80.0–100.0)
Platelets: 204 10*3/uL (ref 150–400)
RBC: 4.6 MIL/uL (ref 3.87–5.11)
RDW: 12.6 % (ref 11.5–15.5)
WBC: 9.7 10*3/uL (ref 4.0–10.5)
nRBC: 0 % (ref 0.0–0.2)

## 2022-11-04 LAB — CULTURE, BLOOD (ROUTINE X 2)

## 2022-11-04 MED ORDER — POTASSIUM CHLORIDE CRYS ER 20 MEQ PO TBCR
40.0000 meq | EXTENDED_RELEASE_TABLET | Freq: Once | ORAL | Status: AC
  Administered 2022-11-04: 40 meq via ORAL
  Filled 2022-11-04: qty 2

## 2022-11-04 NOTE — Progress Notes (Signed)
PROGRESS NOTE    Kathleen Mejia  ZOX:[id number] DOB: 1934-01-03 DOA: 11/03/2022 PCP: Tresa Garter, MD    Brief Narrative:  Kathleen Mejia is a 89 y.o. female with medical history significant of anxiety, Alzheimer's dementia, hypertension, severe mitral regurgitation, chronic systolic congestive heart failure, hyperlipidemia was brought into the emergency department by her son due to significant generalized weakness.  Per son who is at the bedside, patient was in her usual state of health until yesterday but this morning when he was getting ready to go see PCP, she was profoundly weak to the point that she could not even stand up even using the walker.  Typically she uses walker and walks as well.  Patient denies any fever or sweating but does endorse chills since yesterday.  Son noticed some swelling and redness bilateral lower extremities today.  She typically has some redness in bilateral lower extremities but this is worse.  Her swelling is also worse.  Son tells me that she was taking Demadex every other day but for past 3 to 4 weeks, he has been giving her every day but despite of that swelling is worse today.  Patient denies any chest pain, shortness of breath, palpitation, nausea, vomiting, any problem with urination or with bowel movements.  She lives with her son.  Of note, patient had a callus on the plantar surface of the left forefoot which was removed by her PCP about a month ago.     Assessment and Plan:  Bilateral lower extremity cellulitis: Patient denies any trauma but she has a shallow ulcer at the lateral side of the right lower leg.  Her callus which was removed by her PCP a week ago does not seem to be infected.  However she does seem to have clear signs of bilateral lower extremity cellulitis.   -IV abx - Doppler lower extremity is already done and negative for DVT.   Generalized weakness: Likely secondary to infection.   -PT/OT-- SNF   Hyperlipidemia: Resume  statin.   Acute on chronic systolic and diastolic congestive heart failure/bilateral lower extremity edema: Significant edema bilateral lower extremity.  She takes torsemide at home.  BNP slightly elevated but no comparison available.  Will start her on Lasix 40 mg IV twice daily for now and reassess tomorrow. -echo: Left ventricular ejection fraction, by estimation, is 35 to 40%. The  left ventricle has moderately decreased function. The left ventricle  demonstrates global hypokinesis. The left ventricular internal cavity size  was mildly dilated. Left ventricular  diastolic parameters are consistent with Grade I diastolic dysfunction  (impaired relaxation). The average left ventricular global longitudinal  strain is -5.0 %. The global longitudinal strain is abnormal.    Alzheimer's dementia: It appears that patient is supposed to take Aricept but she refuses to take it.  Hypokalemia -replete  Obesity Estimated body mass index is 32.45 kg/m as calculated from the following:   Height as of this encounter: 5\' 1"  (1.549 m).   Weight as of this encounter: 77.9 kg.   DVT prophylaxis: enoxaparin (LOVENOX) injection 40 mg Start: 11/03/22 2200    Code Status: DNR Family Communication: called son  Disposition Plan:  Level of care: Med-Surg Status is: Observation The patient will require care spanning > 2 midnights and should be moved to inpatient because: needs IV abx and IV lasix    Consultants:  none  Subjective: C/o being cold-- getting echo  Objective: Vitals:   11/03/22 2257 11/04/22 0238 11/04/22 0500  11/04/22 0618  BP: (!) 140/74 (!) 119/44  119/64  Pulse: (!) 102 (!) 105  (!) 104  Resp: 17 17  16   Temp: 98.5 F (36.9 C) (!) 97.5 F (36.4 C)  97.7 F (36.5 C)  TempSrc: Oral Oral  Oral  SpO2: 100% 99%  98%  Weight:   77.9 kg   Height:        Intake/Output Summary (Last 24 hours) at 11/04/2022 1112 Last data filed at 11/04/2022 1610 Gross per 24 hour  Intake 723.28  ml  Output --  Net 723.28 ml   Filed Weights   11/03/22 1400 11/04/22 0500  Weight: 78 kg 77.9 kg    Examination:   General: Appearance:    Obese female in no acute distress     Lungs:      respirations unlabored  Heart:    Tachycardic.    MS:   All extremities are intact.  Redness in her LE  Neurologic:   Awake, alert       Data Reviewed: I have personally reviewed following labs and imaging studies  CBC: Recent Labs  Lab 11/03/22 1505 11/03/22 2326 11/04/22 0407  WBC 10.6* 10.7* 9.7  HGB 15.7* 15.8* 15.2*  HCT 47.7* 47.7* 45.6  MCV 98.1 99.4 99.1  PLT 238 212 204   Basic Metabolic Panel: Recent Labs  Lab 11/03/22 1505 11/03/22 2326 11/04/22 0407  NA 138  --  137  K 3.8  --  3.1*  CL 99  --  98  CO2 30  --  28  GLUCOSE 117*  --  121*  BUN 23  --  17  CREATININE 0.71 0.65 0.78  CALCIUM 9.7  --  8.7*  MG  --  2.2  --    GFR: Estimated Creatinine Clearance: 46.8 mL/min (by C-G formula based on SCr of 0.78 mg/dL). Liver Function Tests: Recent Labs  Lab 11/04/22 0407  AST 20  ALT 14  ALKPHOS 68  BILITOT 1.4*  PROT 6.5  ALBUMIN 3.6   No results for input(s): "LIPASE", "AMYLASE" in the last 168 hours. No results for input(s): "AMMONIA" in the last 168 hours. Coagulation Profile: No results for input(s): "INR", "PROTIME" in the last 168 hours. Cardiac Enzymes: No results for input(s): "CKTOTAL", "CKMB", "CKMBINDEX", "TROPONINI" in the last 168 hours. BNP (last 3 results) No results for input(s): "PROBNP" in the last 8760 hours. HbA1C: No results for input(s): "HGBA1C" in the last 72 hours. CBG: No results for input(s): "GLUCAP" in the last 168 hours. Lipid Profile: No results for input(s): "CHOL", "HDL", "LDLCALC", "TRIG", "CHOLHDL", "LDLDIRECT" in the last 72 hours. Thyroid Function Tests: Recent Labs    11/03/22 2326  TSH 1.436   Anemia Panel: No results for input(s): "VITAMINB12", "FOLATE", "FERRITIN", "TIBC", "IRON", "RETICCTPCT" in  the last 72 hours. Sepsis Labs: No results for input(s): "PROCALCITON", "LATICACIDVEN" in the last 168 hours.  Recent Results (from the past 240 hour(s))  Culture, blood (Routine X 2) w Reflex to ID Panel     Status: None (Preliminary result)   Collection Time: 11/03/22 11:26 PM   Specimen: BLOOD  Result Value Ref Range Status   Specimen Description   Final    BLOOD BLOOD RIGHT ARM Performed at [facility], [address] [place], [place] [address]    Special Requests   Final    BOTTLES DRAWN AEROBIC AND ANAEROBIC Blood Culture adequate volume Performed at [facility] [address] [place], [place]  [address]    Culture   Final    NO GROWTH < 12 HOURS Performed at [facility], [address] [place], [place] [address]    Report Status PENDING  Incomplete  Culture, blood (Routine X 2) w Reflex to ID Panel     Status: None (Preliminary result)   Collection Time: 11/03/22 11:26 PM   Specimen: BLOOD  Result Value Ref Range Status   Specimen Description   Final    BLOOD BLOOD RIGHT HAND Performed at [facility], [address], [place], [place] [address]    Special Requests   Final    BOTTLES DRAWN AEROBIC ONLY Blood Culture adequate volume Performed at [facility], [address] [place], [place] [address]    Culture   Final    NO GROWTH < 12 HOURS Performed at [facility], [address] [place], [place] [address]    Report Status PENDING  Incomplete         Radiology Studies: ECHOCARDIOGRAM COMPLETE  Result Date: 11/04/2022    ECHOCARDIOGRAM REPORT   Patient Name:   LLADIRA BARRINGTON Date of Exam: 11/04/2022 Medical Rec #:  [id number]         Height:       61.0 in Accession #:    [id number]        Weight:       171.7 lb Date of Birth:  Apr 24, 1934         BSA:          1.770 m Patient Age:    89 years          BP:           119/64 mmHg Patient Gender: F                 HR:            82 bpm. Exam Location:  Inpatient Procedure: 2D Echo, 3D Echo, Strain Analysis, Cardiac Doppler and Color Doppler Indications:    I50.21 CHF  History:        Patient has prior history of Echocardiogram examinations, most                 recent 07/18/2020. TIA, Mitral Valve Disease, Arrythmias:PVC;                 Risk Factors:Non-Smoker, Hypertension and Dyslipidemia.  Sonographer:    Dondra Prader RVT RCS Referring Phys: [id number] RAVI PAHWANI IMPRESSIONS  1. Left ventricular ejection fraction, by estimation, is 35 to 40%. The left ventricle has moderately decreased function. The left ventricle demonstrates global hypokinesis. The left ventricular internal cavity size was mildly dilated. Left ventricular diastolic parameters are consistent with Grade I diastolic dysfunction (impaired relaxation). The average left ventricular global longitudinal strain is -5.0 %. The global longitudinal strain is abnormal.  2. Right ventricular systolic function is normal. The right ventricular size is mildly enlarged. There is normal pulmonary artery systolic pressure. The estimated right ventricular systolic pressure is 28.0 mmHg.  3. Left atrial size was mildly dilated.  4. Right atrial size was mildly dilated.  5. The mitral valve is normal in structure. Mild mitral valve regurgitation. No evidence of mitral stenosis.  6. Tricuspid valve regurgitation is mild to moderate.  7. The aortic valve is tricuspid. Aortic valve regurgitation is mild. No aortic stenosis is present.  8. The inferior vena cava is normal in size with greater than 50% respiratory  variability, suggesting right atrial pressure of 3 mmHg. Comparison(s): 07/18/20 EF 45-50%. FINDINGS  Left Ventricle: Left ventricular ejection fraction, by estimation, is 35 to 40%. The left ventricle has moderately decreased function. The left ventricle demonstrates global hypokinesis. The average left ventricular global longitudinal strain is -5.0 %.  The global longitudinal strain  is abnormal. The left ventricular internal cavity size was mildly dilated. There is no left ventricular hypertrophy. Left ventricular diastolic parameters are consistent with Grade I diastolic dysfunction (impaired relaxation). Right Ventricle: The right ventricular size is mildly enlarged. No increase in right ventricular wall thickness. Right ventricular systolic function is normal. There is normal pulmonary artery systolic pressure. The tricuspid regurgitant velocity is 2.50  m/s, and with an assumed right atrial pressure of 3 mmHg, the estimated right ventricular systolic pressure is 28.0 mmHg. Left Atrium: Left atrial size was mildly dilated. Right Atrium: Right atrial size was mildly dilated. Pericardium: There is no evidence of pericardial effusion. Mitral Valve: The mitral valve is normal in structure. Mild mitral valve regurgitation. No evidence of mitral valve stenosis. Tricuspid Valve: The tricuspid valve is normal in structure. Tricuspid valve regurgitation is mild to moderate. No evidence of tricuspid stenosis. Aortic Valve: The aortic valve is tricuspid. Aortic valve regurgitation is mild. Aortic regurgitation PHT measures 696 msec. No aortic stenosis is present. Aortic valve mean gradient measures 5.0 mmHg. Aortic valve peak gradient measures 9.5 mmHg. Aortic  valve area, by VTI measures 1.95 cm. Pulmonic Valve: The pulmonic valve was normal in structure. Pulmonic valve regurgitation is trivial. No evidence of pulmonic stenosis. Aorta: The aortic root is normal in size and structure. Venous: The inferior vena cava is normal in size with greater than 50% respiratory variability, suggesting right atrial pressure of 3 mmHg. IAS/Shunts: No atrial level shunt detected by color flow Doppler.  LEFT VENTRICLE PLAX 2D LVIDd:         5.40 cm LVIDs:         4.30 cm     2D Longitudinal Strain LV PW:         1.30 cm     2D Strain GLS Avg:     -5.0 % LV IVS:        1.10 cm LVOT diam:     2.00 cm LV SV:         51  LV SV Index:   29 LVOT Area:     3.14 cm  LV Volumes (MOD) LV vol d, MOD A2C: 76.8 ml LV vol d, MOD A4C: 92.9 ml LV vol s, MOD A2C: 47.0 ml LV vol s, MOD A4C: 52.2 ml LV SV MOD A2C:     29.8 ml LV SV MOD A4C:     92.9 ml LV SV MOD BP:      36.2 ml RIGHT VENTRICLE            IVC RV Basal diam:  3.95 cm    IVC diam: 1.30 cm RV Mid diam:    3.20 cm RV S prime:     8.56 cm/s TAPSE (M-mode): 1.4 cm LEFT ATRIUM             Index        RIGHT ATRIUM           Index LA diam:        4.00 cm 2.26 cm/m   RA Area:     18.70 cm LA Vol (A2C):   85.2 ml 48.13 ml/m  RA Volume:  58.90 ml  33.27 ml/m LA Vol (A4C):   74.1 ml 41.83 ml/m LA Biplane Vol: 81.7 ml 46.15 ml/m  AORTIC VALVE                     PULMONIC VALVE AV Area (Vmax):    2.04 cm      PV Vmax:       0.75 m/s AV Area (Vmean):   2.05 cm      PV Peak grad:  2.2 mmHg AV Area (VTI):     1.95 cm AV Vmax:           154.00 cm/s AV Vmean:          103.000 cm/s AV VTI:            0.261 m AV Peak Grad:      9.5 mmHg AV Mean Grad:      5.0 mmHg LVOT Vmax:         99.80 cm/s LVOT Vmean:        67.300 cm/s LVOT VTI:          0.162 m LVOT/AV VTI ratio: 0.62 AI PHT:            696 msec AR Vena Contracta: 0.40 cm  AORTA Ao Root diam: 2.90 cm Ao Asc diam:  3.50 cm MITRAL VALVE               TRICUSPID VALVE MV Area (PHT): 3.56 cm    TR Peak grad:   25.0 mmHg MV Decel Time: 213 msec    TR Mean grad:   18.0 mmHg MR Peak grad: 70.2 mmHg    TR Vmax:        250.00 cm/s MR Mean grad: 48.0 mmHg    TR Vmean:       200.0 cm/s MR Vmax:      419.00 cm/s MR Vmean:     327.0 cm/s   SHUNTS MV E velocity: 79.80 cm/s  Systemic VTI:  0.16 m MV A velocity: 28.10 cm/s  Systemic Diam: 2.00 cm MV E/A ratio:  2.84 Donato Schultz MD Electronically signed by Donato Schultz MD Signature Date/Time: 11/04/2022/11:03:25 AM    Final    VAS [place] LOWER EXTREMITY VENOUS (DVT) (7a-7p)  Result Date: 11/03/2022  Lower Venous DVT Study Patient Name:  TAIMI PINER  Date of Exam:   11/03/2022 Medical Rec #:  [id number]          Accession #:    [id number] Date of Birth: 31-Jul-1933          Patient Gender: F Patient Age:   43 years Exam Location:  [facility] Procedure:      VAS [place] LOWER EXTREMITY VENOUS (DVT) Referring Phys: JON KNAPP --------------------------------------------------------------------------------  Indications: Swelling.  Risk Factors: None identified. Limitations: Body habitus and poor ultrasound/tissue interface. Comparison Study: No prior studies. Performing Technologist: Chanda Busing RVT  Examination Guidelines: A complete evaluation includes B-mode imaging, spectral Doppler, color Doppler, and power Doppler as needed of all accessible portions of each vessel. Bilateral testing is considered an integral part of a complete examination. Limited examinations for reoccurring indications may be performed as noted. The reflux portion of the exam is performed with the patient in reverse Trendelenburg.  +-----+---------------+---------+-----------+----------+--------------+ RIGHTCompressibilityPhasicitySpontaneityPropertiesThrombus Aging +-----+---------------+---------+-----------+----------+--------------+ CFV  Full           Yes      Yes                                 +-----+---------------+---------+-----------+----------+--------------+   +---------+---------------+---------+-----------+----------+-------------------+  LEFT     CompressibilityPhasicitySpontaneityPropertiesThrombus Aging      +---------+---------------+---------+-----------+----------+-------------------+ CFV      Full           Yes      Yes                                      +---------+---------------+---------+-----------+----------+-------------------+ SFJ      Full                                                             +---------+---------------+---------+-----------+----------+-------------------+ FV Prox  Full                                                              +---------+---------------+---------+-----------+----------+-------------------+ FV Mid   Full                                                             +---------+---------------+---------+-----------+----------+-------------------+ FV Distal               Yes      Yes                                      +---------+---------------+---------+-----------+----------+-------------------+ PFV      Full                                                             +---------+---------------+---------+-----------+----------+-------------------+ POP      Full           Yes      Yes                                      +---------+---------------+---------+-----------+----------+-------------------+ PTV      Full                                                             +---------+---------------+---------+-----------+----------+-------------------+ PERO                                                  Not well visualized +---------+---------------+---------+-----------+----------+-------------------+    Summary: RIGHT: - No evidence of common femoral vein  obstruction.  LEFT: - There is no evidence of deep vein thrombosis in the lower extremity. However, portions of this examination were limited- see technologist comments above.  - No cystic structure found in the popliteal fossa.  *See table(s) above for measurements and observations. Electronically signed by Sherald Hess MD on 11/03/2022 at 4:40:29 PM.    Final    DG Chest Portable 1 View  Result Date: 11/03/2022 CLINICAL DATA:  weakness, swelling EXAM: PORTABLE CHEST - 1 VIEW COMPARISON:  04/06/2014 FINDINGS: Heart is mildly enlarged.  No pulmonary vascular congestion. Lungs are clear. IMPRESSION: Mild cardiomegaly. Electronically Signed   By: Acquanetta Belling M.D.   On: 11/03/2022 15:59   DG Knee 2 Views Left  Result Date: 11/03/2022 CLINICAL DATA:  Increased leg swelling. Stood up at physician's appointment and leg  gave out. EXAM: LEFT KNEE - 1-2 VIEW COMPARISON:  None available FINDINGS: No fracture or dislocation. Soft tissues are unremarkable. Moderate tricompartmental osteoarthrosis. IMPRESSION: No acute abnormality of the left knee. Electronically Signed   By: Acquanetta Belling M.D.   On: 11/03/2022 15:58        Scheduled Meds:  aspirin EC  81 mg Oral Daily   docusate sodium  100 mg Oral BID   enoxaparin (LOVENOX) injection  40 mg Subcutaneous QHS   furosemide  40 mg Intravenous BID   simvastatin  5 mg Oral QHS   Continuous Infusions:  cefTRIAXone (ROCEPHIN)  IV 1 g (11/03/22 1721)     LOS: 0 days    Time spent: 45 minutes spent on chart review, discussion with nursing staff, consultants, updating family and interview/physical exam; more than 50% of that time was spent in counseling and/or coordination of care.    Joseph Art, DO [facility] Available via Epic secure chat 7am-7pm After these hours, please refer to coverage provider listed on [email or web address] 11/04/2022, 11:12 AM

## 2022-11-04 NOTE — Evaluation (Signed)
Physical Therapy Evaluation Patient Details Name: Kathleen Mejia MRN: [id number] DOB: 26-Feb-1934 Today's Date: 11/04/2022  History of Present Illness  Kathleen Mejia is a 89 y.o. female with medical history significant of anxiety, Alzheimer's dementia, hypertension, severe mitral regurgitation, chronic systolic congestive heart failure, hyperlipidemia was brought into the emergency department by her son due to significant generalized weakness. B LE cellulitis noted.  Clinical Impression  Pt admitted with above diagnosis.  Pt currently with functional limitations due to the deficits listed below (see PT Problem List). Pt will benefit from acute skilled PT to increase their independence and safety with mobility to allow discharge.  PT evaluation was limited due to fatigue and pain in LE and will continue to assess mobility further.  Do feel that she would benefit from SNF at time of d/c.        Recommendations for follow up therapy are one component of a multi-disciplinary discharge planning process, led by the attending physician.  Recommendations may be updated based on patient status, additional functional criteria and insurance authorization.  Follow Up Recommendations Can patient physically be transported by private vehicle: No     Assistance Recommended at Discharge Frequent or constant Supervision/Assistance  Patient can return home with the following  A little help with walking and/or transfers;A little help with bathing/dressing/bathroom;Direct supervision/assist for medications management    Equipment Recommendations None recommended by PT  Recommendations for Other Services       Functional Status Assessment Patient has had a recent decline in their functional status and demonstrates the ability to make significant improvements in function in a reasonable and predictable amount of time.     Precautions / Restrictions Precautions Precautions: Fall Restrictions Weight  Bearing Restrictions: No      Mobility  Bed Mobility Overal bed mobility: Needs Assistance Bed Mobility: Rolling Rolling: Min assist         General bed mobility comments: Performed small rolling activities for positoning, but limited due to pain in B LE    Transfers                   General transfer comment: deferred due to pain    Ambulation/Gait               General Gait Details: deferred  Stairs            Wheelchair Mobility    Modified Rankin (Stroke Patients Only)       Balance                                             Pertinent Vitals/Pain Pain Assessment Pain Assessment: Faces Faces Pain Scale: Hurts whole lot Pain Location: B LE Pain Descriptors / Indicators: Grimacing, Guarding, Discomfort, Burning Pain Intervention(s): Monitored during session, Repositioned, Limited activity within patient's tolerance    Home Living Family/patient expects to be discharged to:: Private residence Living Arrangements: Children                      Prior Function Prior Level of Function : Patient poor historian/Family not available             Mobility Comments: reports she walked with a walker       Hand Dominance        Extremity/Trunk Assessment   Upper Extremity Assessment Upper Extremity Assessment: Defer  to OT evaluation    Lower Extremity Assessment Lower Extremity Assessment: Generalized weakness;LLE deficits/detail;RLE deficits/detail RLE Deficits / Details: Redness throughout with swelling of distal LE, able to lift LE and flex knee 25% and complete ankle DF RLE: Unable to fully assess due to pain LLE Deficits / Details: Redness throughout with swelling of distal LE, able to lift LE and flex knee 25%, limited DF due to pain LLE: Unable to fully assess due to pain       Communication      Cognition     Overall Cognitive Status: History of cognitive impairments - at baseline                                           General Comments      Exercises     Assessment/Plan    PT Assessment Patient needs continued PT services  PT Problem List Decreased mobility;Decreased activity tolerance;Decreased skin integrity       PT Treatment Interventions Functional mobility training;Therapeutic activities;Gait training;Balance training;Therapeutic exercise    PT Goals (Current goals can be found in the Care Plan section)  Acute Rehab PT Goals Patient Stated Goal: to go to sleep and rest PT Goal Formulation: Patient unable to participate in goal setting Time For Goal Achievement: 11/18/22 Potential to Achieve Goals: Good    Frequency Min 2X/week     Co-evaluation               AM-PAC PT "6 Clicks" Mobility  Outcome Measure Help needed turning from your back to your side while in a flat bed without using bedrails?: A Lot Help needed moving from lying on your back to sitting on the side of a flat bed without using bedrails?: A Lot Help needed moving to and from a bed to a chair (including a wheelchair)?: Total Help needed standing up from a chair using your arms (e.g., wheelchair or bedside chair)?: Total Help needed to walk in hospital room?: Total Help needed climbing 3-5 steps with a railing? : Total 6 Click Score: 8    End of Session   Activity Tolerance: Patient limited by fatigue;Patient limited by pain Patient left: in bed;with bed alarm set;with call bell/phone within reach Nurse Communication: Mobility status PT Visit Diagnosis: Other abnormalities of gait and mobility (R26.89);Muscle weakness (generalized) (M62.81);Pain Pain - part of body: Ankle and joints of foot    Time: 6578-4696 PT Time Calculation (min) (ACUTE ONLY): 12 min   Charges:   PT Evaluation $PT Eval Low Complexity: 1 Low          Colin Broach., PT Office [phone] Acute Rehab 11/04/2022   Enzo Montgomery 11/04/2022, 10:45 AM

## 2022-11-04 NOTE — Evaluation (Signed)
Occupational Therapy Evaluation Patient Details Name: Kathleen Mejia MRN: [id number] DOB: 1933/09/14 Today's Date: 11/04/2022   History of Present Illness Kathleen Mejia is a 89 y.o. female with medical history significant of anxiety, Alzheimer's dementia, hypertension, severe mitral regurgitation, chronic systolic congestive heart failure, hyperlipidemia was brought into the emergency department by her son due to significant generalized weakness. B LE cellulitis noted.   Clinical Impression   The pt is currently presenting below her baseline level of functioning for self-care management, as she is normally modified independent to independent with ADLs, except for needing supervision for bathing. Today, she was noted to have BLE redness with hypersensitivity to touch, as well as BLE discomfort in standing, general deconditioning, unsteadiness in standing, and generalized strength deficits.  She required some reassurance of her abilities, as she was occasionally anxious and fearful of falling. She was assisted to the bedside commode for toileting then to the bedside chair. She will benefit from further OT services to facilitate improved ADL performance and to decrease the risk for further weakness and deconditioning.      Recommendations for follow up therapy are one component of a multi-disciplinary discharge planning process, led by the attending physician.  Recommendations may be updated based on patient status, additional functional criteria and insurance authorization.   Assistance Recommended at Discharge Frequent or constant Supervision/Assistance  Patient can return home with the following A lot of help with bathing/dressing/bathroom;Assistance with cooking/housework;Direct supervision/assist for medications management;Assist for transportation;Help with stairs or ramp for entrance    Functional Status Assessment  Patient has had a recent decline in their functional status and  demonstrates the ability to make significant improvements in function in a reasonable and predictable amount of time.  Equipment Recommendations  Other (comment) (to be determined pending progress at next setting)       Precautions / Restrictions Precautions Precautions: Fall Restrictions Weight Bearing Restrictions: No      Mobility Bed Mobility Overal bed mobility: Needs Assistance Bed Mobility: Supine to Sit     Supine to sit: Min assist          Transfers Overall transfer level: Needs assistance Equipment used: Rolling walker (2 wheels) Transfers: Sit to/from Stand, Bed to chair/wheelchair/BSC Sit to Stand: +2 physical assistance, Min assist, From elevated surface     Step pivot transfers: Min assist, +2 physical assistance            Balance     Sitting balance-Leahy Scale: Good       Standing balance-Leahy Scale: Poor           ADL either performed or assessed with clinical judgement   ADL Overall ADL's : Needs assistance/impaired Eating/Feeding: Independent;Sitting Eating/Feeding Details (indicate cue type and reason): at chair level Grooming: Set up;Sitting Grooming Details (indicate cue type and reason): chair level         Upper Body Dressing : Minimal assistance;Sitting Upper Body Dressing Details (indicate cue type and reason): simulated EOB   Lower Body Dressing Details (indicate cue type and reason): She required increased assist for sock management seated EOB Toilet Transfer: Minimal assistance;+2 for physical assistance;Stand-pivot;Rolling walker (2 wheels) Toilet Transfer Details (indicate cue type and reason): Sbe required cues for walker positioning, and to reach back prior to sitting. Toileting- Clothing Manipulation and Hygiene: Maximal assistance;Sit to/from stand Toileting - Clothing Manipulation Details (indicate cue type and reason): She required significant assist for posterior peri-hygiene in standing, as well as assist for  clothing management at [facility] level.  Pertinent Vitals/Pain Pain Assessment Pain Assessment: Faces Pain Score: 4  Pain Location: BLE to touch or in weight bearing Pain Intervention(s): Limited activity within patient's tolerance, Monitored during session        Extremity/Trunk Assessment Upper Extremity Assessment Upper Extremity Assessment: BUE AROM WFL. Gross strength 4/5    Lower Extremity Assessment  RLE Deficits / Details: Redness throughout with swelling of distal LE RLE: Unable to fully assess due to pain LLE Deficits / Details: Redness throughout with swelling of distal LE        Communication Communication Communication: No difficulties   Cognition Arousal/Alertness: Awake/alert Behavior During Therapy: Anxious Overall Cognitive Status: Within Functional Limits for tasks assessed        General Comments: Oriented x4, able to follow 1-2 step commands                Home Living Family/patient expects to be discharged to:: Skilled nursing facility Living Arrangements: Alone     Home Access: Stairs to enterHome Equipment: Rollator (4 wheels);Shower seat - built in;Grab bars - tub/shower;Cane - single point   Additional Comments: Her son stated he stays with her at night time.      Prior Functioning/Environment Prior Level of Function : Independent/Modified Independent             Mobility Comments:  (She ambulated household distances using a rollator) ADLs Comments: She required supervision for bathing, however managed feeding, dressing, and toileting without the need for assist. (She did not perform cooking, cleaning, or driving tasks.)        OT Problem List: Decreased strength;Impaired balance (sitting and/or standing);Decreased knowledge of use of DME or AE;Pain      OT Treatment/Interventions: Self-care/ADL training;Therapeutic exercise;Energy conservation;DME and/or AE instruction;Therapeutic  activities;Balance training;Patient/family education    OT Goals(Current goals can be found in the care plan section) Acute Rehab OT Goals Patient Stated Goal: to get better OT Goal Formulation: With patient Time For Goal Achievement: 11/18/22 Potential to Achieve Goals: Good ADL Goals Pt Will Perform Grooming: with supervision;standing Pt Will Perform Lower Body Dressing: with supervision;sit to/from stand Pt Will Transfer to Toilet: with supervision;ambulating Pt Will Perform Toileting - Clothing Manipulation and hygiene: with supervision;sit to/from stand  OT Frequency: Min 1X/week       AM-PAC OT "6 Clicks" Daily Activity     Outcome Measure Help from another person eating meals?: None Help from another person taking care of personal grooming?: None Help from another person toileting, which includes using toliet, bedpan, or urinal?: A Lot Help from another person bathing (including washing, rinsing, drying)?: A Lot Help from another person to put on and taking off regular upper body clothing?: A Little Help from another person to put on and taking off regular lower body clothing?: A Lot 6 Click Score: 17   End of Session Equipment Utilized During Treatment: Gait belt;Rolling walker (2 wheels) Nurse Communication: Mobility status  Activity Tolerance: Other (comment) (Fair tolerance) Patient left: in chair;with call bell/phone within reach;with chair alarm set;with family/visitor present  OT Visit Diagnosis: Unsteadiness on feet (R26.81);Muscle weakness (generalized) (M62.81)                Time: 1610-9604 OT Time Calculation (min): 36 min Charges:  OT Evaluation $OT Eval Moderate Complexity: 1 Mod OT Treatments $Self Care/Home Management : 8-22 mins    Reuben Likes, OTR/L 11/04/2022, 2:28 PM

## 2022-11-04 NOTE — Progress Notes (Signed)
Assume care of patient. Patient awake and alert in bed. Son at beside. Assisted with skin care. Safety precautions maintained.

## 2022-11-05 DIAGNOSIS — R531 Weakness: Secondary | ICD-10-CM | POA: Diagnosis not present

## 2022-11-05 LAB — GLUCOSE, CAPILLARY: Glucose-Capillary: 111 mg/dL — ABNORMAL HIGH (ref 70–99)

## 2022-11-05 LAB — CULTURE, BLOOD (ROUTINE X 2): Culture: NO GROWTH

## 2022-11-05 LAB — BASIC METABOLIC PANEL
Anion gap: 10 (ref 5–15)
BUN: 17 mg/dL (ref 8–23)
CO2: 27 mmol/L (ref 22–32)
Calcium: 8.6 mg/dL — ABNORMAL LOW (ref 8.9–10.3)
Chloride: 99 mmol/L (ref 98–111)
Creatinine, Ser: 0.67 mg/dL (ref 0.44–1.00)
GFR, Estimated: >60 mL/min (ref >60)
Glucose, Bld: 109 mg/dL — ABNORMAL HIGH (ref 70–99)
Potassium: 3.1 mmol/L — ABNORMAL LOW (ref 3.5–5.1)
Sodium: 136 mmol/L (ref 135–145)

## 2022-11-05 LAB — CBC
HCT: 43 % (ref 36.0–46.0)
Hemoglobin: 14.1 g/dL (ref 12.0–15.0)
MCH: 32.8 pg (ref 26.0–34.0)
MCHC: 32.8 g/dL (ref 30.0–36.0)
MCV: 100 fL (ref 80.0–100.0)
Platelets: 198 10*3/uL (ref 150–400)
RBC: 4.3 MIL/uL (ref 3.87–5.11)
RDW: 12.5 % (ref 11.5–15.5)
WBC: 10.5 10*3/uL (ref 4.0–10.5)
nRBC: 0 % (ref 0.0–0.2)

## 2022-11-05 MED ORDER — FUROSEMIDE 40 MG PO TABS
40.0000 mg | ORAL_TABLET | Freq: Once | ORAL | Status: AC
  Administered 2022-11-05: 40 mg via ORAL
  Filled 2022-11-05: qty 1

## 2022-11-05 MED ORDER — POTASSIUM CHLORIDE CRYS ER 20 MEQ PO TBCR
40.0000 meq | EXTENDED_RELEASE_TABLET | Freq: Once | ORAL | Status: AC
  Administered 2022-11-05: 40 meq via ORAL
  Filled 2022-11-05: qty 2

## 2022-11-05 MED ORDER — DONEPEZIL HCL 10 MG PO TABS
5.0000 mg | ORAL_TABLET | Freq: Every day | ORAL | Status: DC
  Administered 2022-11-05 – 2022-11-08 (×4): 5 mg via ORAL
  Filled 2022-11-05 (×4): qty 1

## 2022-11-05 NOTE — Consult Note (Signed)
WOC Nurse Consult Note: Reason for Consult:Bilateral LEs with edema, erythema, full thickness wound on lateral RLE, partial thickness wounds to posterior LLE, plantar callus shaved by PCP is reported 1 month ago. Decreased mobility, deformities of feel, digits. No DVT noted on ultrasound Wound type: venous insufficiency Pressure Injury POA: N/A Measurement: Bedside RN to measure wound to RLE and document measurements on Nursing Flow Sheet Wound bed: red, moist Drainage (amount, consistency, odor) moderate serous Periwound:erythematous, edematous Dressing procedure/placement/frequency: Prevalon boots, a pressure redistribution chair cushion, and a sacral silicone foam dressing are provided for pressure injury prevention. Turning and repositioning are in place. Guidance for timely incontinence care using our house skin care products, specifically our house no rinse and pH balanced skin cleanser and clear zinc oxide moisture barrier cream is provided via the Orders.  Wound care to bilateral LEs will be to:  wash LEs (particularly between digits) with soap and water, rinse and dry. Nursing is to ensure interdigital spaces are dry prior to performing wound care. Wounds are to be cleansed with NS, patted dry, then covered with silver hydrofiber (Aquacel Ag+ Advantage, [employer] [place] # [id number]). This is to be topped with ABD pads (gauze 4x4s to left plantar foot). Dressings are to be secured by applying Kerlix roll gauze from just below toes to just below knees. Kerlix is to be topped with 6-inch ACE bandages applied in a similar manner.  "Strips" of Aquacel Ag+ are to be placed between digits for absorption of moisture and antimicrobial donation.   WOC nursing team will not follow, but will remain available to this patient, the nursing and medical teams.  Please re-consult if needed.  Thank you for inviting [place] to participate in this patient's Plan of Care.  Ladona Mow, MSN, RN, CNS, GNP, Leda Min,  [employer], [employer] phone:  [phone]

## 2022-11-05 NOTE — Progress Notes (Signed)
Physical Therapy Treatment Patient Details Name: Kathleen Mejia MRN: [id number] DOB: 1933-08-17 Today's Date: 11/05/2022   History of Present Illness Kathleen Mejia is a 89 y.o. female with medical history significant of anxiety, Alzheimer's dementia, hypertension, severe mitral regurgitation, chronic systolic congestive heart failure, hyperlipidemia was brought into the emergency department by her son due to significant generalized weakness. B LE cellulitis noted.    PT Comments    Pt agreeable to mobilize and assisted with ambulating however only tolerated 8 feet with RW due to bil LE pain and fatigue.  Continue to recommend post acute rehab upon d/c.    Recommendations for follow up therapy are one component of a multi-disciplinary discharge planning process, led by the attending physician.  Recommendations may be updated based on patient status, additional functional criteria and insurance authorization.  Follow Up Recommendations  Can patient physically be transported by private vehicle: No    Assistance Recommended at Discharge Frequent or constant Supervision/Assistance  Patient can return home with the following A little help with walking and/or transfers;A little help with bathing/dressing/bathroom;Direct supervision/assist for medications management;Assistance with cooking/housework   Equipment Recommendations  None recommended by PT    Recommendations for Other Services       Precautions / Restrictions Precautions Precautions: Fall     Mobility  Bed Mobility Overal bed mobility: Needs Assistance Bed Mobility: Supine to Sit     Supine to sit: Mod assist     General bed mobility comments: light assist for LEs over EOB and trunk upright, utilized bed pad to scoot pt to EOB    Transfers Overall transfer level: Needs assistance Equipment used: Rolling walker (2 wheels) Transfers: Sit to/from Stand Sit to Stand: Min assist, +2 safety/equipment            General transfer comment: verbal cues for hand placement, assist to rise and control descent    Ambulation/Gait Ambulation/Gait assistance: Min assist, +2 safety/equipment Gait Distance (Feet): 8 Feet Assistive device: Rolling walker (2 wheels) Gait Pattern/deviations: Step-through pattern, Decreased stride length, Trunk flexed, Narrow base of support       General Gait Details: verbal cues for RW positioning, posture; pt reported fatigue and bil LEs pain limiting her mobility and requested to sit down after 8 feet so had recliner pulled up behind pt   Stairs             Wheelchair Mobility    Modified Rankin (Stroke Patients Only)       Balance Overall balance assessment: Needs assistance         Standing balance support: Bilateral upper extremity supported, Reliant on assistive device for balance Standing balance-Leahy Scale: Poor                              Cognition Arousal/Alertness: Awake/alert Behavior During Therapy: WFL for tasks assessed/performed Overall Cognitive Status: History of cognitive impairments - at baseline                                 General Comments: hx Alzheimer's dementia, able to follow simple commands, requires repeated cues at times        Exercises      General Comments        Pertinent Vitals/Pain Pain Assessment Pain Assessment: 0-10 Pain Score: 5  Pain Location: bil LEs with dependent position Pain Descriptors / Indicators: Grimacing,  Guarding, Discomfort, Tender Pain Intervention(s): Monitored during session, Repositioned (no pain at rest, repositioned to comfort)    Home Living                          Prior Function            PT Goals (current goals can now be found in the care plan section) Progress towards PT goals: Progressing toward goals    Frequency    Min 1X/week      PT Plan Current plan remains appropriate    Co-evaluation               AM-PAC PT "6 Clicks" Mobility   Outcome Measure  Help needed turning from your back to your side while in a flat bed without using bedrails?: A Lot Help needed moving from lying on your back to sitting on the side of a flat bed without using bedrails?: A Lot Help needed moving to and from a bed to a chair (including a wheelchair)?: A Lot Help needed standing up from a chair using your arms (e.g., wheelchair or bedside chair)?: A Lot Help needed to walk in hospital room?: A Lot Help needed climbing 3-5 steps with a railing? : Total 6 Click Score: 11    End of Session Equipment Utilized During Treatment: Gait belt Activity Tolerance: Patient limited by pain;Patient limited by fatigue Patient left: in chair;with call bell/phone within reach;with chair alarm set Nurse Communication: Mobility status PT Visit Diagnosis: Other abnormalities of gait and mobility (R26.89);Muscle weakness (generalized) (M62.81)     Time: 1050-1107 PT Time Calculation (min) (ACUTE ONLY): 17 min  Charges:  $Gait Training: 8-22 mins                    Paulino Door, DPT Physical Therapist Acute Rehabilitation Services Office: [phone]   Kathleen Mejia 11/05/2022, 4:12 PM

## 2022-11-05 NOTE — Progress Notes (Signed)
PROGRESS NOTE    Kathleen Mejia  ZOX:[id number] DOB: 1933/09/03 DOA: 11/03/2022 PCP: Tresa Garter, MD    Brief Narrative:  Kathleen Mejia is a 89 y.o. female with medical history significant of anxiety, Alzheimer's dementia, hypertension, severe mitral regurgitation, chronic systolic congestive heart failure, hyperlipidemia was brought into the emergency department by her son due to significant generalized weakness.  Per son who is at the bedside, patient was in her usual state of health until yesterday but this morning when he was getting ready to go see PCP, she was profoundly weak to the point that she could not even stand up even using the walker.  Typically she uses walker and walks as well.  Patient denies any fever or sweating but does endorse chills since yesterday.  Son noticed some swelling and redness bilateral lower extremities today.  She typically has some redness in bilateral lower extremities but this is worse.  Her swelling is also worse.  Son tells me that she was taking Demadex every other day but for past 3 to 4 weeks, he has been giving her every day but despite of that swelling is worse today.  Patient denies any chest pain, shortness of breath, palpitation, nausea, vomiting, any problem with urination or with bowel movements.  She lives with her son.  Of note, patient had a callus on the plantar surface of the left forefoot which was removed by her PCP about a month ago.     Assessment and Plan:  Bilateral lower extremity cellulitis: Patient denies any trauma but she has a shallow ulcer at the lateral side of the right lower leg.  Her callus which was removed by her PCP a week ago does not seem to be infected.  However she does seem to have clear signs of bilateral lower extremity cellulitis.   -IV abx - Doppler lower extremity is already done and negative for DVT.   Generalized weakness: Likely secondary to infection.   -PT/OT-- SNF   Hyperlipidemia: Resume  statin.   Acute on chronic systolic and diastolic congestive heart failure/bilateral lower extremity edema: Significant edema bilateral lower extremity.  She takes torsemide at home.  BNP slightly elevated but no comparison available.  Will start her on Lasix 40 mg IV twice daily for now -- improved LE edema -echo: Left ventricular ejection fraction, by estimation, is 35 to 40%. The  left ventricle has moderately decreased function. The left ventricle  demonstrates global hypokinesis. The left ventricular internal cavity size  was mildly dilated. Left ventricular  diastolic parameters are consistent with Grade I diastolic dysfunction  (impaired relaxation). The average left ventricular global longitudinal  strain is -5.0 %. The global longitudinal strain is abnormal.    Alzheimer's dementia: It appears that patient is supposed to take Aricept  -resume  Hypokalemia -replete  Obesity Estimated body mass index is 31.24 kg/m as calculated from the following:   Height as of this encounter: 5\' 1"  (1.549 m).   Weight as of this encounter: 75 kg.   DVT prophylaxis: enoxaparin (LOVENOX) injection 40 mg Start: 11/03/22 2200    Code Status: DNR Family Communication: called son 6/1  Disposition Plan:  Level of care: Med-Surg Status WJ:XBJY    Consultants:  none  Subjective: Feeling better  Objective: Vitals:   11/04/22 1344 11/04/22 2014 11/05/22 0347 11/05/22 0353  BP: 111/63 108/66 129/87   Pulse: 92 95 (!) 103   Resp: 20 16 16    Temp: 97.7 F (36.5 C) 98.3  F (36.8 C) 98.4 F (36.9 C)   TempSrc: Oral Oral Oral   SpO2: 99% 99% 98%   Weight:    75 kg  Height:        Intake/Output Summary (Last 24 hours) at 11/05/2022 1132 Last data filed at 11/05/2022 0351 Gross per 24 hour  Intake 720 ml  Output 1502 ml  Net -782 ml   Filed Weights   11/03/22 1400 11/04/22 0500 11/05/22 0353  Weight: 78 kg 77.9 kg 75 kg    Examination:   General: Appearance:    Obese female in  no acute distress     Lungs:      respirations unlabored  Heart:    Tachycardic.    MS:   All extremities are intact.  Redness in her LE, less swelling b/l  Neurologic:   Awake, alert       Data Reviewed: I have personally reviewed following labs and imaging studies  CBC: Recent Labs  Lab 11/03/22 1505 11/03/22 2326 11/04/22 0407 11/05/22 0335  WBC 10.6* 10.7* 9.7 10.5  HGB 15.7* 15.8* 15.2* 14.1  HCT 47.7* 47.7* 45.6 43.0  MCV 98.1 99.4 99.1 100.0  PLT 238 212 204 198   Basic Metabolic Panel: Recent Labs  Lab 11/03/22 1505 11/03/22 2326 11/04/22 0407 11/05/22 0335  NA 138  --  137 136  K 3.8  --  3.1* 3.1*  CL 99  --  98 99  CO2 30  --  28 27  GLUCOSE 117*  --  121* 109*  BUN 23  --  17 17  CREATININE 0.71 0.65 0.78 0.67  CALCIUM 9.7  --  8.7* 8.6*  MG  --  2.2  --   --    GFR: Estimated Creatinine Clearance: 45.9 mL/min (by C-G formula based on SCr of 0.67 mg/dL). Liver Function Tests: Recent Labs  Lab 11/04/22 0407  AST 20  ALT 14  ALKPHOS 68  BILITOT 1.4*  PROT 6.5  ALBUMIN 3.6   No results for input(s): "LIPASE", "AMYLASE" in the last 168 hours. No results for input(s): "AMMONIA" in the last 168 hours. Coagulation Profile: No results for input(s): "INR", "PROTIME" in the last 168 hours. Cardiac Enzymes: No results for input(s): "CKTOTAL", "CKMB", "CKMBINDEX", "TROPONINI" in the last 168 hours. BNP (last 3 results) No results for input(s): "PROBNP" in the last 8760 hours. HbA1C: No results for input(s): "HGBA1C" in the last 72 hours. CBG: Recent Labs  Lab 11/05/22 0753  GLUCAP 111*   Lipid Profile: No results for input(s): "CHOL", "HDL", "LDLCALC", "TRIG", "CHOLHDL", "LDLDIRECT" in the last 72 hours. Thyroid Function Tests: Recent Labs    11/03/22 2326  TSH 1.436   Anemia Panel: No results for input(s): "VITAMINB12", "FOLATE", "FERRITIN", "TIBC", "IRON", "RETICCTPCT" in the last 72 hours. Sepsis Labs: No results for input(s):  "PROCALCITON", "LATICACIDVEN" in the last 168 hours.  Recent Results (from the past 240 hour(s))  Culture, blood (Routine X 2) w Reflex to ID Panel     Status: None (Preliminary result)   Collection Time: 11/03/22 11:26 PM   Specimen: BLOOD  Result Value Ref Range Status   Specimen Description   Final    BLOOD BLOOD RIGHT ARM Performed at [facility], [address], [place], [place] [address]    Special Requests   Final    BOTTLES DRAWN AEROBIC AND ANAEROBIC Blood Culture adequate volume Performed at [facility], [address] [place], [place] [address]  Culture   Final    NO GROWTH 1 DAY Performed at [facility], [address] [place], [place] [address]    Report Status PENDING  Incomplete  Culture, blood (Routine X 2) w Reflex to ID Panel     Status: None (Preliminary result)   Collection Time: 11/03/22 11:26 PM   Specimen: BLOOD  Result Value Ref Range Status   Specimen Description   Final    BLOOD BLOOD RIGHT HAND Performed at [facility], [address], [place], [place] [address]    Special Requests   Final    BOTTLES DRAWN AEROBIC ONLY Blood Culture adequate volume Performed at [facility], [address], [place], [place] [address]    Culture   Final    NO GROWTH 1 DAY Performed at [facility], [address] [place], [place] [address]    Report Status PENDING  Incomplete         Radiology Studies: ECHOCARDIOGRAM COMPLETE  Result Date: 11/04/2022    ECHOCARDIOGRAM REPORT   Patient Name:   Kathleen Mejia Date of Exam: 11/04/2022 Medical Rec #:  [id number]         Height:       61.0 in Accession #:    [id number]        Weight:       171.7 lb Date of Birth:  [birth year]         BSA:          1.770 m Patient Age:    89 years          BP:           119/64 mmHg Patient Gender: F                 HR:           82 bpm. Exam Location:  Inpatient Procedure: 2D Echo, 3D Echo,  Strain Analysis, Cardiac Doppler and Color Doppler Indications:    I50.21 CHF  History:        Patient has prior history of Echocardiogram examinations, most                 recent 07/18/2020. TIA, Mitral Valve Disease, Arrythmias:PVC;                 Risk Factors:Non-Smoker, Hypertension and Dyslipidemia.  Sonographer:    Dondra Prader RVT RCS Referring Phys: [id number] RAVI PAHWANI IMPRESSIONS  1. Left ventricular ejection fraction, by estimation, is 35 to 40%. The left ventricle has moderately decreased function. The left ventricle demonstrates global hypokinesis. The left ventricular internal cavity size was mildly dilated. Left ventricular diastolic parameters are consistent with Grade I diastolic dysfunction (impaired relaxation). The average left ventricular global longitudinal strain is -5.0 %. The global longitudinal strain is abnormal.  2. Right ventricular systolic function is normal. The right ventricular size is mildly enlarged. There is normal pulmonary artery systolic pressure. The estimated right ventricular systolic pressure is 28.0 mmHg.  3. Left atrial size was mildly dilated.  4. Right atrial size was mildly dilated.  5. The mitral valve is normal in structure. Mild mitral valve regurgitation. No evidence of mitral stenosis.  6. Tricuspid valve regurgitation is mild to moderate.  7. The aortic valve is tricuspid. Aortic valve regurgitation is mild. No aortic stenosis is present.  8. The inferior vena cava is normal in size with greater than 50% respiratory variability, suggesting right atrial pressure of  3 mmHg. Comparison(s): 07/18/20 EF 45-50%. FINDINGS  Left Ventricle: Left ventricular ejection fraction, by estimation, is 35 to 40%. The left ventricle has moderately decreased function. The left ventricle demonstrates global hypokinesis. The average left ventricular global longitudinal strain is -5.0 %.  The global longitudinal strain is abnormal. The left ventricular internal cavity size was mildly  dilated. There is no left ventricular hypertrophy. Left ventricular diastolic parameters are consistent with Grade I diastolic dysfunction (impaired relaxation). Right Ventricle: The right ventricular size is mildly enlarged. No increase in right ventricular wall thickness. Right ventricular systolic function is normal. There is normal pulmonary artery systolic pressure. The tricuspid regurgitant velocity is 2.50  m/s, and with an assumed right atrial pressure of 3 mmHg, the estimated right ventricular systolic pressure is 28.0 mmHg. Left Atrium: Left atrial size was mildly dilated. Right Atrium: Right atrial size was mildly dilated. Pericardium: There is no evidence of pericardial effusion. Mitral Valve: The mitral valve is normal in structure. Mild mitral valve regurgitation. No evidence of mitral valve stenosis. Tricuspid Valve: The tricuspid valve is normal in structure. Tricuspid valve regurgitation is mild to moderate. No evidence of tricuspid stenosis. Aortic Valve: The aortic valve is tricuspid. Aortic valve regurgitation is mild. Aortic regurgitation PHT measures 696 msec. No aortic stenosis is present. Aortic valve mean gradient measures 5.0 mmHg. Aortic valve peak gradient measures 9.5 mmHg. Aortic  valve area, by VTI measures 1.95 cm. Pulmonic Valve: The pulmonic valve was normal in structure. Pulmonic valve regurgitation is trivial. No evidence of pulmonic stenosis. Aorta: The aortic root is normal in size and structure. Venous: The inferior vena cava is normal in size with greater than 50% respiratory variability, suggesting right atrial pressure of 3 mmHg. IAS/Shunts: No atrial level shunt detected by color flow Doppler.  LEFT VENTRICLE PLAX 2D LVIDd:         5.40 cm LVIDs:         4.30 cm     2D Longitudinal Strain LV PW:         1.30 cm     2D Strain GLS Avg:     -5.0 % LV IVS:        1.10 cm LVOT diam:     2.00 cm LV SV:         51 LV SV Index:   29 LVOT Area:     3.14 cm  LV Volumes (MOD) LV vol  d, MOD A2C: 76.8 ml LV vol d, MOD A4C: 92.9 ml LV vol s, MOD A2C: 47.0 ml LV vol s, MOD A4C: 52.2 ml LV SV MOD A2C:     29.8 ml LV SV MOD A4C:     92.9 ml LV SV MOD BP:      36.2 ml RIGHT VENTRICLE            IVC RV Basal diam:  3.95 cm    IVC diam: 1.30 cm RV Mid diam:    3.20 cm RV S prime:     8.56 cm/s TAPSE (M-mode): 1.4 cm LEFT ATRIUM             Index        RIGHT ATRIUM           Index LA diam:        4.00 cm 2.26 cm/m   RA Area:     18.70 cm LA Vol (A2C):   85.2 ml 48.13 ml/m  RA Volume:   58.90 ml  33.27 ml/m LA  Vol (A4C):   74.1 ml 41.83 ml/m LA Biplane Vol: 81.7 ml 46.15 ml/m  AORTIC VALVE                     PULMONIC VALVE AV Area (Vmax):    2.04 cm      PV Vmax:       0.75 m/s AV Area (Vmean):   2.05 cm      PV Peak grad:  2.2 mmHg AV Area (VTI):     1.95 cm AV Vmax:           154.00 cm/s AV Vmean:          103.000 cm/s AV VTI:            0.261 m AV Peak Grad:      9.5 mmHg AV Mean Grad:      5.0 mmHg LVOT Vmax:         99.80 cm/s LVOT Vmean:        67.300 cm/s LVOT VTI:          0.162 m LVOT/AV VTI ratio: 0.62 AI PHT:            696 msec AR Vena Contracta: 0.40 cm  AORTA Ao Root diam: 2.90 cm Ao Asc diam:  3.50 cm MITRAL VALVE               TRICUSPID VALVE MV Area (PHT): 3.56 cm    TR Peak grad:   25.0 mmHg MV Decel Time: 213 msec    TR Mean grad:   18.0 mmHg MR Peak grad: 70.2 mmHg    TR Vmax:        250.00 cm/s MR Mean grad: 48.0 mmHg    TR Vmean:       200.0 cm/s MR Vmax:      419.00 cm/s MR Vmean:     327.0 cm/s   SHUNTS MV E velocity: 79.80 cm/s  Systemic VTI:  0.16 m MV A velocity: 28.10 cm/s  Systemic Diam: 2.00 cm MV E/A ratio:  2.84 Donato Schultz MD Electronically signed by Donato Schultz MD Signature Date/Time: 11/04/2022/11:03:25 AM    Final    VAS [place] LOWER EXTREMITY VENOUS (DVT) (7a-7p)  Result Date: 11/03/2022  Lower Venous DVT Study Patient Name:  Kathleen Mejia  Date of Exam:   11/03/2022 Medical Rec #: [id number]          Accession #:    [id number] Date of Birth: 10/22/1933           Patient Gender: F Patient Age:   14 years Exam Location:  [facility] Procedure:      VAS [place] LOWER EXTREMITY VENOUS (DVT) Referring Phys: JON KNAPP --------------------------------------------------------------------------------  Indications: Swelling.  Risk Factors: None identified. Limitations: Body habitus and poor ultrasound/tissue interface. Comparison Study: No prior studies. Performing Technologist: Chanda Busing RVT  Examination Guidelines: A complete evaluation includes B-mode imaging, spectral Doppler, color Doppler, and power Doppler as needed of all accessible portions of each vessel. Bilateral testing is considered an integral part of a complete examination. Limited examinations for reoccurring indications may be performed as noted. The reflux portion of the exam is performed with the patient in reverse Trendelenburg.  +-----+---------------+---------+-----------+----------+--------------+ RIGHTCompressibilityPhasicitySpontaneityPropertiesThrombus Aging +-----+---------------+---------+-----------+----------+--------------+ CFV  Full           Yes      Yes                                 +-----+---------------+---------+-----------+----------+--------------+   +---------+---------------+---------+-----------+----------+-------------------+  LEFT     CompressibilityPhasicitySpontaneityPropertiesThrombus Aging      +---------+---------------+---------+-----------+----------+-------------------+ CFV      Full           Yes      Yes                                      +---------+---------------+---------+-----------+----------+-------------------+ SFJ      Full                                                             +---------+---------------+---------+-----------+----------+-------------------+ FV Prox  Full                                                             +---------+---------------+---------+-----------+----------+-------------------+  FV Mid   Full                                                             +---------+---------------+---------+-----------+----------+-------------------+ FV Distal               Yes      Yes                                      +---------+---------------+---------+-----------+----------+-------------------+ PFV      Full                                                             +---------+---------------+---------+-----------+----------+-------------------+ POP      Full           Yes      Yes                                      +---------+---------------+---------+-----------+----------+-------------------+ PTV      Full                                                             +---------+---------------+---------+-----------+----------+-------------------+ PERO                                                  Not well visualized +---------+---------------+---------+-----------+----------+-------------------+    Summary: RIGHT: - No evidence of common femoral vein  obstruction.  LEFT: - There is no evidence of deep vein thrombosis in the lower extremity. However, portions of this examination were limited- see technologist comments above.  - No cystic structure found in the popliteal fossa.  *See table(s) above for measurements and observations. Electronically signed by Sherald Hess MD on 11/03/2022 at 4:40:29 PM.    Final    DG Chest Portable 1 View  Result Date: 11/03/2022 CLINICAL DATA:  weakness, swelling EXAM: PORTABLE CHEST - 1 VIEW COMPARISON:  04/06/2014 FINDINGS: Heart is mildly enlarged.  No pulmonary vascular congestion. Lungs are clear. IMPRESSION: Mild cardiomegaly. Electronically Signed   By: Acquanetta Belling M.D.   On: 11/03/2022 15:59   DG Knee 2 Views Left  Result Date: 11/03/2022 CLINICAL DATA:  Increased leg swelling. Stood up at physician's appointment and leg gave out. EXAM: LEFT KNEE - 1-2 VIEW COMPARISON:  None available FINDINGS: No  fracture or dislocation. Soft tissues are unremarkable. Moderate tricompartmental osteoarthrosis. IMPRESSION: No acute abnormality of the left knee. Electronically Signed   By: Acquanetta Belling M.D.   On: 11/03/2022 15:58        Scheduled Meds:  aspirin EC  81 mg Oral Daily   docusate sodium  100 mg Oral BID   enoxaparin (LOVENOX) injection  40 mg Subcutaneous QHS   furosemide  40 mg Intravenous BID   simvastatin  5 mg Oral QHS   Continuous Infusions:  cefTRIAXone (ROCEPHIN)  IV 1 g (11/04/22 1805)     LOS: 1 day    Time spent: 45 minutes spent on chart review, discussion with nursing staff, consultants, updating family and interview/physical exam; more than 50% of that time was spent in counseling and/or coordination of care.    Joseph Art, DO [facility] Available via Epic secure chat 7am-7pm After these hours, please refer to coverage provider listed on [email or web address] 11/05/2022, 11:32 AM

## 2022-11-06 DIAGNOSIS — R531 Weakness: Secondary | ICD-10-CM | POA: Diagnosis not present

## 2022-11-06 LAB — BASIC METABOLIC PANEL
Anion gap: 9 (ref 5–15)
BUN: 15 mg/dL (ref 8–23)
CO2: 28 mmol/L (ref 22–32)
Calcium: 8.3 mg/dL — ABNORMAL LOW (ref 8.9–10.3)
Chloride: 98 mmol/L (ref 98–111)
Creatinine, Ser: 0.62 mg/dL (ref 0.44–1.00)
GFR, Estimated: >60 mL/min (ref >60)
Glucose, Bld: 107 mg/dL — ABNORMAL HIGH (ref 70–99)
Potassium: 3.1 mmol/L — ABNORMAL LOW (ref 3.5–5.1)
Sodium: 135 mmol/L (ref 135–145)

## 2022-11-06 LAB — HEMOGLOBIN A1C
Hgb A1c MFr Bld: 5.9 % — ABNORMAL HIGH (ref 4.8–5.6)
Mean Plasma Glucose: 123 mg/dL

## 2022-11-06 LAB — CULTURE, BLOOD (ROUTINE X 2): Special Requests: ADEQUATE

## 2022-11-06 MED ORDER — HALOPERIDOL LACTATE 5 MG/ML IJ SOLN: 1.0000 mg | Freq: Once | INTRAMUSCULAR | Status: DC

## 2022-11-06 MED ORDER — POTASSIUM CHLORIDE CRYS ER 20 MEQ PO TBCR
40.0000 meq | EXTENDED_RELEASE_TABLET | Freq: Every day | ORAL | Status: DC
  Administered 2022-11-06 – 2022-11-09 (×4): 40 meq via ORAL
  Filled 2022-11-06 (×4): qty 2

## 2022-11-06 NOTE — Progress Notes (Signed)
PROGRESS NOTE    Kathleen Mejia  ZOX:[id number] DOB: 06/02/1934 DOA: 11/03/2022 PCP: Tresa Garter, MD    Brief Narrative:  Kathleen Mejia is a 89 y.o. female with medical history significant of anxiety, Alzheimer's dementia, hypertension, severe mitral regurgitation, chronic systolic congestive heart failure, hyperlipidemia was brought into the emergency department by her son due to significant generalized weakness.  Per son who is at the bedside, patient was in her usual state of health until yesterday but this morning when he was getting ready to go see PCP, she was profoundly weak to the point that she could not even stand up even using the walker.  Typically she uses walker and walks as well.  Patient denies any fever or sweating but does endorse chills since yesterday.  Son noticed some swelling and redness bilateral lower extremities today.  She typically has some redness in bilateral lower extremities but this is worse.  Her swelling is also worse.  Son tells me that she was taking Demadex every other day but for past 3 to 4 weeks, he has been giving her every day but despite of that swelling is worse today.  Patient denies any chest pain, shortness of breath, palpitation, nausea, vomiting, any problem with urination or with bowel movements.  She lives with her son.  Of note, patient had a callus on the plantar surface of the left forefoot which was removed by her PCP about a month ago.     Assessment and Plan:  Bilateral lower extremity cellulitis: Patient denies any trauma but she has a shallow ulcer at the lateral side of the right lower leg.  Her callus which was removed by her PCP a week ago does not seem to be infected.  However she does seem to have clear signs of bilateral lower extremity cellulitis.   -IV abx - Doppler lower extremity is already done and negative for DVT.   Generalized weakness: Likely secondary to infection.   -PT/OT-- SNF   Hyperlipidemia: Resume  statin.   Acute on chronic systolic and diastolic congestive heart failure/bilateral lower extremity edema:  -Significant edema bilateral lower extremity.   -takes torsemide at home.  - BNP slightly elevated but no comparison available.   - Lasix 40 mg IV twice daily for now -- improved LE edema -echo: Left ventricular ejection fraction, by estimation, is 35 to 40%. The left ventricle has moderately decreased function. The left ventricle demonstrates global hypokinesis. The left ventricular internal cavity size was mildly dilated. Left ventricular diastolic parameters are consistent with Grade I diastolic dysfunction (impaired relaxation). .    Alzheimer's dementia: It appears that patient is supposed to take Aricept  -resume -delirium  Hypokalemia -replete  Obesity Estimated body mass index is 31.24 kg/m as calculated from the following:   Height as of this encounter: 5\' 1"  (1.549 m).   Weight as of this encounter: 75 kg.   DVT prophylaxis: enoxaparin (LOVENOX) injection 40 mg Start: 11/03/22 2200    Code Status: DNR Family Communication: LM for son  Disposition Plan:  Level of care: Med-Surg Status WJ:[id number]    Consultants:  none  Subjective: confused  Objective: Vitals:   11/05/22 1351 11/05/22 1922 11/06/22 0451 11/06/22 1206  BP: (!) 122/98 138/71 121/71 112/77  Pulse: (!) 107 100 (!) 105 (!) 108  Resp: 16 17 20 18   Temp: 98 F (36.7 C) 97.9 F (36.6 C) 98.8 F (37.1 C) 98.9 F (37.2 C)  TempSrc:   Oral Oral  SpO2: 98% 97% 98% 98%  Weight:      Height:        Intake/Output Summary (Last 24 hours) at 11/06/2022 1211 Last data filed at 11/06/2022 0900 Gross per 24 hour  Intake 240 ml  Output 2300 ml  Net -2060 ml   Filed Weights   11/03/22 1400 11/04/22 0500 11/05/22 0353  Weight: 78 kg 77.9 kg 75 kg    Examination:    General: Appearance:    Obese female in no acute distress     Lungs:     respirations unlabored  Heart:    Tachycardic.   MS:    All extremities are intact.   Neurologic:   Awake, alert--- confused         Data Reviewed: I have personally reviewed following labs and imaging studies  CBC: Recent Labs  Lab 11/03/22 1505 11/03/22 2326 11/04/22 0407 11/05/22 0335  WBC 10.6* 10.7* 9.7 10.5  HGB 15.7* 15.8* 15.2* 14.1  HCT 47.7* 47.7* 45.6 43.0  MCV 98.1 99.4 99.1 100.0  PLT 238 212 204 198   Basic Metabolic Panel: Recent Labs  Lab 11/03/22 1505 11/03/22 2326 11/04/22 0407 11/05/22 0335 11/06/22 0341  NA 138  --  137 136 135  K 3.8  --  3.1* 3.1* 3.1*  CL 99  --  98 99 98  CO2 30  --  28 27 28   GLUCOSE 117*  --  121* 109* 107*  BUN 23  --  17 17 15   CREATININE 0.71 0.65 0.78 0.67 0.62  CALCIUM 9.7  --  8.7* 8.6* 8.3*  MG  --  2.2  --   --   --    GFR: Estimated Creatinine Clearance: 45.9 mL/min (by C-G formula based on SCr of 0.62 mg/dL). Liver Function Tests: Recent Labs  Lab 11/04/22 0407  AST 20  ALT 14  ALKPHOS 68  BILITOT 1.4*  PROT 6.5  ALBUMIN 3.6   No results for input(s): "LIPASE", "AMYLASE" in the last 168 hours. No results for input(s): "AMMONIA" in the last 168 hours. Coagulation Profile: No results for input(s): "INR", "PROTIME" in the last 168 hours. Cardiac Enzymes: No results for input(s): "CKTOTAL", "CKMB", "CKMBINDEX", "TROPONINI" in the last 168 hours. BNP (last 3 results) No results for input(s): "PROBNP" in the last 8760 hours. HbA1C: Recent Labs    11/03/22 2326  HGBA1C 5.9*   CBG: Recent Labs  Lab 11/05/22 0753  GLUCAP 111*   Lipid Profile: No results for input(s): "CHOL", "HDL", "LDLCALC", "TRIG", "CHOLHDL", "LDLDIRECT" in the last 72 hours. Thyroid Function Tests: Recent Labs    11/03/22 2326  TSH 1.436   Anemia Panel: No results for input(s): "VITAMINB12", "FOLATE", "FERRITIN", "TIBC", "IRON", "RETICCTPCT" in the last 72 hours. Sepsis Labs: No results for input(s): "PROCALCITON", "LATICACIDVEN" in the last 168 hours.  Recent Results (from  the past 240 hour(s))  Culture, blood (Routine X 2) w Reflex to ID Panel     Status: None (Preliminary result)   Collection Time: 11/03/22 11:26 PM   Specimen: BLOOD  Result Value Ref Range Status   Specimen Description   Final    BLOOD BLOOD RIGHT ARM Performed at [facility], [address], [place], [place] [address]    Special Requests   Final    BOTTLES DRAWN AEROBIC AND ANAEROBIC Blood Culture adequate volume Performed at [facility], [address] [place], [place] [address]    Culture   Final  NO GROWTH 2 DAYS Performed at [facility], [address], [place], [place] [address]    Report Status PENDING  Incomplete  Culture, blood (Routine X 2) w Reflex to ID Panel     Status: None (Preliminary result)   Collection Time: 11/03/22 11:26 PM   Specimen: BLOOD  Result Value Ref Range Status   Specimen Description   Final    BLOOD BLOOD RIGHT HAND Performed at [facility], [address] [place], [place] [address]    Special Requests   Final    BOTTLES DRAWN AEROBIC ONLY Blood Culture adequate volume Performed at [facility], [address], [place], [place] [address]    Culture   Final    NO GROWTH 2 DAYS Performed at [facility], [address], [place], [place] [address]    Report Status PENDING  Incomplete         Radiology Studies: No results found.      Scheduled Meds:  aspirin EC  81 mg Oral Daily   docusate sodium  100 mg Oral BID   donepezil  5 mg Oral QHS   enoxaparin (LOVENOX) injection  40 mg Subcutaneous QHS   furosemide  40 mg Intravenous BID   potassium chloride  40 mEq Oral Daily   simvastatin  5 mg Oral QHS   Continuous Infusions:  cefTRIAXone (ROCEPHIN)  IV 1 g (11/05/22 1735)     LOS: 2 days    Time spent: 45 minutes spent on chart review, discussion with nursing staff, consultants, updating family and interview/physical exam; more than  50% of that time was spent in counseling and/or coordination of care.    Joseph Art, DO [facility] Available via Epic secure chat 7am-7pm After these hours, please refer to coverage provider listed on [email or web address] 11/06/2022, 12:11 PM

## 2022-11-06 NOTE — Progress Notes (Signed)
Heart Failure Navigator Progress Note  Assessed for Heart & Vascular TOC clinic readiness.  Patient does not meet criteria due to EF 35-40%, per MD note Alzheimer's Dementia.   Navigator will sign off at this time.   Rhae Hammock, BSN, Scientist, clinical (histocompatibility and immunogenetics) Only

## 2022-11-06 NOTE — TOC Progression Note (Signed)
Transition of Care [facility]) - Progression Note    Patient Details  Name: Kathleen Mejia MRN: [id number] Date of Birth: 12/09/1933  Transition of Care [facility] CM/SW Contact  Coralyn Helling, [place] Phone Number: 11/06/2022, 1:10 PM  Clinical Narrative:    TOC to follow for SNF placement. TOC met with patient at bedside. Pleasant, but confused. Asked TOC to speak with son. TOC unable to reach son. Will continue to follow to complete assessment and SNF placement if agreeable.    Expected Discharge Plan: Skilled Nursing Facility Barriers to Discharge: Continued Medical Work up  Expected Discharge Plan and Services                                               Social Determinants of Health (SDOH) Interventions SDOH Screenings   Food Insecurity: No Food Insecurity (11/03/2022)  Housing: Low Risk  (11/03/2022)  Transportation Needs: No Transportation Needs (11/03/2022)  Utilities: Not At Risk (11/03/2022)  Alcohol Screen: Low Risk  (04/14/2022)  Depression (PHQ2-9): Medium Risk (08/02/2022)  Financial Resource Strain: Low Risk  (04/14/2022)  Physical Activity: Inactive (04/14/2022)  Social Connections: Moderately Integrated (04/14/2022)  Stress: No Stress Concern Present (04/14/2022)  Tobacco Use: Low Risk  (11/03/2022)    Readmission Risk Interventions     No data to display

## 2022-11-06 NOTE — TOC Initial Note (Addendum)
Transition of Care [facility]) - Initial/Assessment Note    Patient Details  Name: Kathleen Mejia MRN: [id number] Date of Birth: 1933/06/18  Transition of [facility]) CM/SW Contact:    Larrie Kass, LCSW Phone Number: 11/06/2022, 1:16 PM  Clinical Narrative:                 CSW spoke with pt regarding SNF recommendations , she reports she would like CSW to speak with her son. CSW attempted to speak with pt's son Maurine Minister to discuss ,left HIPAA complaint VM requesting a return call. TOC to follow.   ADDEN  1:25pm CSW spoke with pt's son Maurine Minister to discuss SNF placement for his mother. Pt's son agreed to placement , would [place] a facility in [place] in [place] area. CSW explained the process, pt will need insurance auth. CSW to fax pt out for SNF placement. TOC to follow.   Expected Discharge Plan:  (TBD) Barriers to Discharge: Continued Medical Work up   Patient Goals and CMS Choice Patient states their goals for this hospitalization and ongoing recovery are:: SNF if I need it.          Expected Discharge Plan and Services In-house Referral: Clinical Social Work     Living arrangements for the past 2 months: Single Family Home                                      Prior Living Arrangements/Services Living arrangements for the past 2 months: Single Family Home Lives with:: Self Patient language and need for interpreter reviewed:: Yes Do you feel safe going back to the place where you live?: Yes      Need for Family Participation in Patient Care: Yes (Comment) Care giver support system in place?: Yes (comment)   Criminal Activity/Legal Involvement Pertinent to Current Situation/Hospitalization: No - Comment as needed  Activities of Daily Living Home Assistive Devices/Equipment: Walker (specify type) ADL Screening (condition at time of admission) Patient's cognitive ability adequate to safely complete daily activities?: Yes Is the patient  deaf or have difficulty hearing?: No Does the patient have difficulty seeing, even when wearing glasses/contacts?: No Does the patient have difficulty concentrating, remembering, or making decisions?: Yes Patient able to express need for assistance with ADLs?: Yes Does the patient have difficulty dressing or bathing?: Yes Independently performs ADLs?: No Communication: Independent Dressing (OT): Needs assistance Is this a change from baseline?: Pre-admission baseline Grooming: Independent Feeding: Independent Bathing: Needs assistance Is this a change from baseline?: Pre-admission baseline Toileting: Needs assistance Is this a change from baseline?: Pre-admission baseline In/Out Bed: Needs assistance Is this a change from baseline?: Pre-admission baseline Walks in Home: Needs assistance Is this a change from baseline?: Pre-admission baseline Does the patient have difficulty walking or climbing stairs?: Yes Weakness of Legs: Both Weakness of Arms/Hands: None  Permission Sought/Granted   Permission granted to share information with : Yes, Verbal Permission Granted  Share Information with NAME: Mclaine,Dennis     Permission granted to share info w Relationship: son  Permission granted to share info w Contact Information: [phone]  Emotional Assessment   Attitude/Demeanor/Rapport: Gracious Affect (typically observed): Accepting Orientation: : Oriented to Place, Oriented to  Time, Oriented to Situation, Oriented to Self      Admission diagnosis:  Peripheral edema [R60.0] Cellulitis of left lower extremity [L03.116] Generalized weakness [R53.1] Cellulitis [L03.90] Patient Active Problem List  Diagnosis Date Noted   Cellulitis 11/04/2022   Generalized weakness 11/03/2022   Lower extremity cellulitis 11/03/2022   Severe mitral regurgitation 11/03/2022   Knee pain, bilateral 08/02/2022   Callus of foot 05/30/2022   Wart 02/02/2022   History of CVA (cerebrovascular  accident) 07/14/2020   TIA (transient ischemic attack) 05/04/2020   Alzheimer disease (HCC) 05/04/2020   Intertrigo 08/06/2019   Endometrial ca (HCC) 02/12/2019   Genital prolapse 01/28/2019   H/O total vaginal hysterectomy 01/28/2019   Preop exam for internal medicine 01/23/2019   Vaginal prolapse 11/13/2018   Stasis dermatitis of both legs 02/18/2018   B12 deficiency 11/30/2017   Glaucoma 07/31/2017   Hearing loss 07/31/2017   Gait disorder 07/31/2017   Anxiety 02/01/2017   Melena 05/05/2015   Atrial fibrillation (HCC) 03/29/2015   Elevated hemoglobin (HCC) 11/28/2014   Pain in joint, ankle and foot 04/28/2014   PVC's (premature ventricular contractions) 03/02/2014   Abnormal echocardiogram 02/13/2014   Chest pain, unspecified 01/30/2014   Abdominal pain, chronic, epigastric 01/30/2014   Tachycardia 01/30/2014   Knee pain 12/18/2013   Paresthesia 06/25/2013   Obesity 12/11/2012   Elevated glucose 12/11/2012   URI (upper respiratory infection) 09/09/2012   Vertigo 03/11/2012   DOE (dyspnea on exertion) 01/26/2012   Neoplasm of uncertain behavior of skin 04/20/2011   Ganglion cyst 12/22/2010   Venous insufficiency of leg 09/21/2010   Edema 09/21/2010   SKIN RASH 09/07/2009   FATTY LIVER DISEASE 12/22/2008   OSTEOARTHRITIS 12/22/2008   Cholelithiasis with chronic cholecystitis 07/01/2008   Cystitis 02/27/2008   Fatigue 02/27/2008   ABNORMAL LIVER FUNCTION TESTS 02/27/2008   Dyslipidemia 08/29/2007   Disturbance of skin sensation 08/22/2007   DEFICIENCY, VITAMIN D NOS 02/27/2007   Essential hypertension 02/27/2007   LOW BACK PAIN 02/27/2007   PCP:  Tresa Garter, MD Pharmacy:   [facility] [id number] [place], [place] - [address] AT [facility] [address] [place] [address] Phone: [phone] Fax: [phone]     Social Determinants of Health (SDOH) Social History: SDOH Screenings   Food  Insecurity: No Food Insecurity (11/03/2022)  Housing: Low Risk  (11/03/2022)  Transportation Needs: No Transportation Needs (11/03/2022)  Utilities: Not At Risk (11/03/2022)  Alcohol Screen: Low Risk  (04/14/2022)  Depression (PHQ2-9): Medium Risk (08/02/2022)  Financial Resource Strain: Low Risk  (04/14/2022)  Physical Activity: Inactive (04/14/2022)  Social Connections: Moderately Integrated (04/14/2022)  Stress: No Stress Concern Present (04/14/2022)  Tobacco Use: Low Risk  (11/03/2022)   SDOH Interventions:     Readmission Risk Interventions     No data to display

## 2022-11-06 NOTE — NC FL2 (Signed)
[place] MEDICAID [facility] LEVEL OF CARE FORM     IDENTIFICATION  Patient Name: Kathleen Mejia Birthdate: 1933-06-14 Sex: female Admission Date (Current Location): 11/03/2022  [facility] and [place] Number:  Producer, television/film/video and Address:  [facility],  [address] [place], [place] [address]      Provider Number: [id number]  Attending Physician Name and Address:  Joseph Art, DO  Relative Name and Phone Number:  Buss,Dennis (Son) [phone] [facility])    Current Level of Care: Hospital Recommended Level of Care: Skilled Nursing Facility Prior Approval Number:    Date Approved/Denied:   PASRR Number: [id number]  Discharge Plan: SNF    Current Diagnoses: Patient Active Problem List   Diagnosis Date Noted   Cellulitis 11/04/2022   Generalized weakness 11/03/2022   Lower extremity cellulitis 11/03/2022   Severe mitral regurgitation 11/03/2022   Knee pain, bilateral 08/02/2022   Callus of foot 05/30/2022   Wart 02/02/2022   History of CVA (cerebrovascular accident) 07/14/2020   TIA (transient ischemic attack) 05/04/2020   Alzheimer disease (HCC) 05/04/2020   Intertrigo 08/06/2019   Endometrial ca (HCC) 02/12/2019   Genital prolapse 01/28/2019   H/O total vaginal hysterectomy 01/28/2019   Preop exam for internal medicine 01/23/2019   Vaginal prolapse 11/13/2018   Stasis dermatitis of both legs 02/18/2018   B12 deficiency 11/30/2017   Glaucoma 07/31/2017   Hearing loss 07/31/2017   Gait disorder 07/31/2017   Anxiety 02/01/2017   Melena 05/05/2015   Atrial fibrillation (HCC) 03/29/2015   Elevated hemoglobin (HCC) 11/28/2014   Pain in joint, ankle and foot 04/28/2014   PVC's (premature ventricular contractions) 03/02/2014   Abnormal echocardiogram 02/13/2014   Chest pain, unspecified 01/30/2014   Abdominal pain, chronic, epigastric 01/30/2014   Tachycardia 01/30/2014   Knee pain 12/18/2013   Paresthesia 06/25/2013   Obesity 12/11/2012    Elevated glucose 12/11/2012   URI (upper respiratory infection) 09/09/2012   Vertigo 03/11/2012   DOE (dyspnea on exertion) 01/26/2012   Neoplasm of uncertain behavior of skin 04/20/2011   Ganglion cyst 12/22/2010   Venous insufficiency of leg 09/21/2010   Edema 09/21/2010   SKIN RASH 09/07/2009   FATTY LIVER DISEASE 12/22/2008   OSTEOARTHRITIS 12/22/2008   Cholelithiasis with chronic cholecystitis 07/01/2008   Cystitis 02/27/2008   Fatigue 02/27/2008   ABNORMAL LIVER FUNCTION TESTS 02/27/2008   Dyslipidemia 08/29/2007   Disturbance of skin sensation 08/22/2007   DEFICIENCY, VITAMIN D NOS 02/27/2007   Essential hypertension 02/27/2007   LOW BACK PAIN 02/27/2007    Orientation RESPIRATION BLADDER Height & Weight     Time, Self, Situation, Place  Normal Incontinent, External catheter Weight: 165 lb 5.5 oz (75 kg) Height:  5\' 1"  (154.9 cm)  BEHAVIORAL SYMPTOMS/MOOD NEUROLOGICAL BOWEL NUTRITION STATUS      Continent Diet (Heart Healthy)  AMBULATORY STATUS COMMUNICATION OF NEEDS Skin   Limited Assist Verbally Normal                       Personal Care Assistance Level of Assistance  Bathing, Feeding, Dressing Bathing Assistance: Limited assistance Feeding assistance: Independent Dressing Assistance: Limited assistance     Functional Limitations Info  Hearing, Speech, Sight Sight Info: Impaired (glasses) Hearing Info: Adequate Speech Info: Adequate    SPECIAL CARE FACTORS FREQUENCY  PT (By licensed PT), OT (By licensed OT)     PT Frequency: 5 x a week OT Frequency: 5 x a week  Contractures Contractures Info: Not present    Additional Factors Info  Code Status, Allergies, Psychotropic Code Status Info: DNR Allergies Info: Eliquis (Apixaban)  Losartan  Maxzide (Hydrochlorothiazide W-triamterene)  Metoprolol           Current Medications (11/06/2022):  This is the current hospital active medication list Current Facility-Administered Medications   Medication Dose Route Frequency Provider Last Rate Last Admin   acetaminophen (TYLENOL) tablet 650 mg  650 mg Oral Q6H PRN Hughie Closs, MD   650 mg at 11/05/22 1438   Or   acetaminophen (TYLENOL) suppository 650 mg  650 mg Rectal Q6H PRN Hughie Closs, MD       aspirin EC tablet 81 mg  81 mg Oral Daily Pahwani, Daleen Bo, MD   81 mg at 11/06/22 0940   cefTRIAXone (ROCEPHIN) 1 g in sodium chloride 0.9 % 100 mL IVPB  1 g Intravenous Q24H Pahwani, Ravi, MD 200 mL/hr at 11/05/22 1735 1 g at 11/05/22 1735   docusate sodium (COLACE) capsule 100 mg  100 mg Oral BID Hughie Closs, MD   100 mg at 11/06/22 0940   donepezil (ARICEPT) tablet 5 mg  5 mg Oral QHS Vann, Jessica U, DO   5 mg at 11/05/22 2101   enoxaparin (LOVENOX) injection 40 mg  40 mg Subcutaneous QHS Hughie Closs, MD   40 mg at 11/05/22 2101   furosemide (LASIX) injection 40 mg  40 mg Intravenous BID Hughie Closs, MD   40 mg at 11/06/22 0940   ondansetron (ZOFRAN) tablet 4 mg  4 mg Oral Q6H PRN Hughie Closs, MD       Or   ondansetron (ZOFRAN) injection 4 mg  4 mg Intravenous Q6H PRN Pahwani, Ravi, MD       polyethylene glycol (MIRALAX / GLYCOLAX) packet 17 g  17 g Oral Daily PRN Pahwani, Daleen Bo, MD       potassium chloride SA (KLOR-CON M) CR tablet 40 mEq  40 mEq Oral Daily Vann, Jessica U, DO   40 mEq at 11/06/22 0940   simvastatin (ZOCOR) tablet 5 mg  5 mg Oral QHS Hughie Closs, MD   5 mg at 11/05/22 2101     Discharge Medications: Please see discharge summary for a list of discharge medications.  Relevant Imaging Results:  Relevant Lab Results:   Additional Information SSN: [id number]  Valentina Shaggy Desmond Tufano, LCSW

## 2022-11-06 NOTE — Progress Notes (Signed)
Mobility Specialist - Progress Note   11/06/22 0945  Mobility  Activity Transferred from bed to chair  Level of Assistance Minimal assist, patient does 75% or more  Assistive Device Front wheel walker  Distance Ambulated (ft) 5 ft  Range of Motion/Exercises Active  Activity Response Tolerated well  Mobility Referral Yes  $Mobility charge 1 Mobility  Mobility Specialist Start Time (ACUTE ONLY) [id number]  Mobility Specialist Stop Time (ACUTE ONLY) 0945  Mobility Specialist Time Calculation (min) (ACUTE ONLY) 20 min   Pt received in bed and agreed to mobility. Pt was Min A for bed mobility, and sit to stand.  Pt was concerned that family hasn't been around, wants to see them.  Pt took small shuffled steps to recliner and afterwards did 2 sit to stands, holding the stand for 1 minute each.  Returned to chair with all needs met and alarm on.   Marilynne Halsted Mobility Specialist

## 2022-11-07 DIAGNOSIS — R531 Weakness: Secondary | ICD-10-CM | POA: Diagnosis not present

## 2022-11-07 LAB — CULTURE, BLOOD (ROUTINE X 2)
Culture: NO GROWTH
Special Requests: ADEQUATE

## 2022-11-07 LAB — BASIC METABOLIC PANEL
Anion gap: 11 (ref 5–15)
BUN: 17 mg/dL (ref 8–23)
CO2: 28 mmol/L (ref 22–32)
Calcium: 8.6 mg/dL — ABNORMAL LOW (ref 8.9–10.3)
Chloride: 98 mmol/L (ref 98–111)
Creatinine, Ser: 0.68 mg/dL (ref 0.44–1.00)
GFR, Estimated: >60 mL/min (ref >60)
Glucose, Bld: 109 mg/dL — ABNORMAL HIGH (ref 70–99)
Potassium: 3.4 mmol/L — ABNORMAL LOW (ref 3.5–5.1)
Sodium: 137 mmol/L (ref 135–145)

## 2022-11-07 LAB — CBC
HCT: 41.6 % (ref 36.0–46.0)
Hemoglobin: 13.8 g/dL (ref 12.0–15.0)
MCH: 32.6 pg (ref 26.0–34.0)
MCHC: 33.2 g/dL (ref 30.0–36.0)
MCV: 98.3 fL (ref 80.0–100.0)
Platelets: 207 10*3/uL (ref 150–400)
RBC: 4.23 MIL/uL (ref 3.87–5.11)
RDW: 12.3 % (ref 11.5–15.5)
WBC: 9.5 10*3/uL (ref 4.0–10.5)
nRBC: 0 % (ref 0.0–0.2)

## 2022-11-07 MED ORDER — TORSEMIDE 20 MG PO TABS
20.0000 mg | ORAL_TABLET | Freq: Every day | ORAL | Status: DC
  Administered 2022-11-07 – 2022-11-09 (×3): 20 mg via ORAL
  Filled 2022-11-07 (×3): qty 1

## 2022-11-07 NOTE — Progress Notes (Signed)
PROGRESS NOTE    Kathleen Mejia  ZOX:[id number] DOB: 1934-05-01 DOA: 11/03/2022 PCP: Tresa Garter, MD    Brief Narrative:  Kathleen Mejia is a 89 y.o. female with medical history significant of anxiety, Alzheimer's dementia, hypertension, severe mitral regurgitation, chronic systolic congestive heart failure, hyperlipidemia was brought into the emergency department by her son due to significant generalized weakness.  Per son who is at the bedside, patient was in her usual state of health until yesterday but this morning when he was getting ready to go see PCP, she was profoundly weak to the point that she could not even stand up even using the walker.  Typically she uses walker and walks as well.  Patient denies any fever or sweating but does endorse chills since yesterday.  Son noticed some swelling and redness bilateral lower extremities today.  She typically has some redness in bilateral lower extremities but this is worse.  Her swelling is also worse.  Son tells me that she was taking Demadex every other day but for past 3 to 4 weeks, he has been giving her every day but despite of that swelling is worse today.  Patient denies any chest pain, shortness of breath, palpitation, nausea, vomiting, any problem with urination or with bowel movements.  She lives with her son.  Of note, patient had a callus on the plantar surface of the left forefoot which was removed by her PCP about a month ago.   Found to have volume overload on exam with venous insuffiency and possible cellulitis.    Assessment and Plan:  Venous insufficiency with Bilateral lower extremity cellulitis: Patient denies any trauma but she has a shallow ulcer at the lateral side of the right lower leg.  Her callus which was removed by her PCP a week ago does not seem to be infected.  However she does seem to have clear signs of bilateral lower extremity cellulitis.   -IV abx-- consider change to PO in AM - Doppler lower  extremity is already done and negative for DVT.   Generalized weakness: Likely secondary to infection.   -PT/OT-- SNF   Hyperlipidemia: Resume statin.   Acute on chronic systolic and diastolic congestive heart failure/bilateral lower extremity edema:  -Significant edema bilateral lower extremity.   -takes torsemide at home.  - BNP slightly elevated but no comparison available.   - Lasix 40 mg IV -- improved LE edema- -change to PO regimen but doubled from home dose-- monitor for fluid -echo: Left ventricular ejection fraction, by estimation, is 35 to 40%. The left ventricle has moderately decreased function. The left ventricle demonstrates global hypokinesis. The left ventricular internal cavity size was mildly dilated. Left ventricular diastolic parameters are consistent with Grade I diastolic dysfunction (impaired relaxation). .    Alzheimer's dementia: It appears that patient is supposed to take Aricept  -resume -delirium precautions  Hypokalemia -replete  Obesity Estimated body mass index is 31.24 kg/m as calculated from the following:   Height as of this encounter: 5\' 1"  (1.549 m).   Weight as of this encounter: 75 kg.   DVT prophylaxis: enoxaparin (LOVENOX) injection 40 mg Start: 11/03/22 2200    Code Status: DNR Family Communication: son 6/5  Disposition Plan:  Level of care: Med-Surg Status WJ:XBJY    Consultants:  none  Subjective: Laying flat in bed  Objective: Vitals:   11/06/22 0451 11/06/22 1206 11/06/22 1920 11/07/22 0404  BP: 121/71 112/77 115/70 (!) 156/81  Pulse: (!) 105 (!) 108  Resp: 20 18 18 18   Temp: 98.8 F (37.1 C) 98.9 F (37.2 C) 98.4 F (36.9 C)   TempSrc: Oral Oral    SpO2: 98% 98% 97% 97%  Weight:      Height:        Intake/Output Summary (Last 24 hours) at 11/07/2022 1133 Last data filed at 11/07/2022 1610 Gross per 24 hour  Intake 480 ml  Output --  Net 480 ml   Filed Weights   11/03/22 1400 11/04/22 0500 11/05/22 0353   Weight: 78 kg 77.9 kg 75 kg    Examination:     General: Appearance:    Obese female in no acute distress     Lungs:      respirations unlabored  Heart:    Tachycardic.   MS:   All extremities are intact, legs wrapped but swelling has decreased overall.   Neurologic:   Awake, alert- pleasantly confused but less than yesterday       Data Reviewed: I have personally reviewed following labs and imaging studies  CBC: Recent Labs  Lab 11/03/22 1505 11/03/22 2326 11/04/22 0407 11/05/22 0335 11/07/22 0359  WBC 10.6* 10.7* 9.7 10.5 9.5  HGB 15.7* 15.8* 15.2* 14.1 13.8  HCT 47.7* 47.7* 45.6 43.0 41.6  MCV 98.1 99.4 99.1 100.0 98.3  PLT 238 212 204 198 207   Basic Metabolic Panel: Recent Labs  Lab 11/03/22 1505 11/03/22 2326 11/04/22 0407 11/05/22 0335 11/06/22 0341 11/07/22 0359  NA 138  --  137 136 135 137  K 3.8  --  3.1* 3.1* 3.1* 3.4*  CL 99  --  98 99 98 98  CO2 30  --  28 27 28 28   GLUCOSE 117*  --  121* 109* 107* 109*  BUN 23  --  17 17 15 17   CREATININE 0.71 0.65 0.78 0.67 0.62 0.68  CALCIUM 9.7  --  8.7* 8.6* 8.3* 8.6*  MG  --  2.2  --   --   --   --    GFR: Estimated Creatinine Clearance: 45.9 mL/min (by C-G formula based on SCr of 0.68 mg/dL). Liver Function Tests: Recent Labs  Lab 11/04/22 0407  AST 20  ALT 14  ALKPHOS 68  BILITOT 1.4*  PROT 6.5  ALBUMIN 3.6   No results for input(s): "LIPASE", "AMYLASE" in the last 168 hours. No results for input(s): "AMMONIA" in the last 168 hours. Coagulation Profile: No results for input(s): "INR", "PROTIME" in the last 168 hours. Cardiac Enzymes: No results for input(s): "CKTOTAL", "CKMB", "CKMBINDEX", "TROPONINI" in the last 168 hours. BNP (last 3 results) No results for input(s): "PROBNP" in the last 8760 hours. HbA1C: No results for input(s): "HGBA1C" in the last 72 hours.  CBG: Recent Labs  Lab 11/05/22 0753  GLUCAP 111*   Lipid Profile: No results for input(s): "CHOL", "HDL",  "LDLCALC", "TRIG", "CHOLHDL", "LDLDIRECT" in the last 72 hours. Thyroid Function Tests: No results for input(s): "TSH", "T4TOTAL", "FREET4", "T3FREE", "THYROIDAB" in the last 72 hours.  Anemia Panel: No results for input(s): "VITAMINB12", "FOLATE", "FERRITIN", "TIBC", "IRON", "RETICCTPCT" in the last 72 hours. Sepsis Labs: No results for input(s): "PROCALCITON", "LATICACIDVEN" in the last 168 hours.  Recent Results (from the past 240 hour(s))  Culture, blood (Routine X 2) w Reflex to ID Panel     Status: None (Preliminary result)   Collection Time: 11/03/22 11:26 PM   Specimen: BLOOD  Result Value Ref Range Status   Specimen Description   Final  BLOOD BLOOD RIGHT ARM Performed at [facility], [address] [place], [place] [address]    Special Requests   Final    BOTTLES DRAWN AEROBIC AND ANAEROBIC Blood Culture adequate volume Performed at [facility], [address] [place], [place] [address]    Culture   Final    NO GROWTH 2 DAYS Performed at [facility], [address] [place], [place] [address]    Report Status PENDING  Incomplete  Culture, blood (Routine X 2) w Reflex to ID Panel     Status: None (Preliminary result)   Collection Time: 11/03/22 11:26 PM   Specimen: BLOOD  Result Value Ref Range Status   Specimen Description   Final    BLOOD BLOOD RIGHT HAND Performed at [facility], [address] [place], [place] [address]    Special Requests   Final    BOTTLES DRAWN AEROBIC ONLY Blood Culture adequate volume Performed at [facility], [address] [place], [place] [address]    Culture   Final    NO GROWTH 2 DAYS Performed at [facility], [address], [place], [place] [address]    Report Status PENDING  Incomplete         Radiology Studies: No results found.      Scheduled Meds:  aspirin EC  81 mg Oral Daily   docusate sodium  100 mg Oral BID    donepezil  5 mg Oral QHS   enoxaparin (LOVENOX) injection  40 mg Subcutaneous QHS   haloperidol lactate  1 mg Intravenous Once   potassium chloride  40 mEq Oral Daily   simvastatin  5 mg Oral QHS   torsemide  20 mg Oral Daily   Continuous Infusions:  cefTRIAXone (ROCEPHIN)  IV 1 g (11/06/22 1722)     LOS: 3 days    Time spent: 45 minutes spent on chart review, discussion with nursing staff, consultants, updating family and interview/physical exam; more than 50% of that time was spent in counseling and/or coordination of care.    Joseph Art, DO [facility] Available via Epic secure chat 7am-7pm After these hours, please refer to coverage provider listed on [email or web address] 11/07/2022, 11:33 AM

## 2022-11-07 NOTE — Progress Notes (Signed)
Occupational Therapy Treatment Patient Details Name: Kathleen Mejia MRN: [id number] DOB: Apr 07, 1934 Today's Date: 11/07/2022   History of present illness Kathleen Mejia is a 89 y.o. female with medical history significant of anxiety, Alzheimer's dementia, hypertension, severe mitral regurgitation, chronic systolic congestive heart failure, hyperlipidemia was brought into the emergency department by her son due to significant generalized weakness. B LE cellulitis noted.   OT comments  The pt was seen for ADL instruction & participation, as well as general functional strengthening. She performed upper body dressing, lower body dressing, sit to stand functional transfers, and short distance ambulation on her room using a RW. She required intermittent cues for memory/recall during the session. She reported having chronic bilateral knee discomfort, and an associated "popping" sensation during standing tasks. She further expressed a fear of falling, requiring some reassurance in this regard. Continue OT plan of care.    Recommendations for follow up therapy are one component of a multi-disciplinary discharge planning process, led by the attending physician.  Recommendations may be updated based on patient status, additional functional criteria and insurance authorization.    Assistance Recommended at Discharge Frequent or constant Supervision/Assistance  Patient can return home with the following  A lot of help with bathing/dressing/bathroom;Assistance with cooking/housework;Direct supervision/assist for medications management;Assist for transportation;Help with stairs or ramp for entrance   Equipment Recommendations  Other (comment) (to be determined pending progress at next setting)       Precautions / Restrictions Precautions Precautions: Fall Restrictions Weight Bearing Restrictions: No       Mobility Bed Mobility               General bed mobility comments: Pt was received  seated in the bedside chair    Transfers Overall transfer level: Needs assistance Equipment used: Rolling walker (2 wheels) Transfers: Sit to/from Stand Sit to Stand: Min assist           General transfer comment: verbal cues for hand placement, assist to rise and control descent         ADL either performed or assessed with clinical judgement   ADL Overall ADL's : Needs assistance/impaired Eating/Feeding: Independent;Sitting Eating/Feeding Details (indicate cue type and reason): at chair level Grooming: Set up;Sitting Grooming Details (indicate cue type and reason): chair level         Upper Body Dressing : Minimal assistance;Sitting Upper Body Dressing Details (indicate cue type and reason): She doffed a hospital gown, then donned another clean one seated with min assist. Lower Body Dressing: Moderate assistance;Sit to/from stand Lower Body Dressing Details (indicate cue type and reason): The pt donned a pair of underwear over her ankles & up to her knees in sitting at chair level (she flexed at the hips). She subsequently required assist to donn them up over her hips in standing, as she held to a RW for support in standing.                     Vision Baseline Vision/History: 1 Wears glasses            Cognition Arousal/Alertness: Awake/alert Behavior During Therapy: WFL for tasks assessed/performed Overall Cognitive Status: History of cognitive impairments - at baseline Area of Impairment: Memory            General Comments: hx Alzheimer's dementia, able to follow simple commands, requires repeated cues at times                   Pertinent Vitals/  Pain       Pain Assessment Pain Assessment: No/denies pain         Frequency  Min 1X/week        Progress Toward Goals  OT Goals(current goals can now be found in the care plan section)  Progress towards OT goals: Progressing toward goals  Acute Rehab OT Goals Patient Stated Goal: to  get better OT Goal Formulation: With patient Time For Goal Achievement: 11/18/22 Potential to Achieve Goals: Good  Plan Discharge plan remains appropriate       AM-PAC OT "6 Clicks" Daily Activity     Outcome Measure   Help from another person eating meals?: None Help from another person taking care of personal grooming?: None Help from another person toileting, which includes using toliet, bedpan, or urinal?: A Lot Help from another person bathing (including washing, rinsing, drying)?: A Lot Help from another person to put on and taking off regular upper body clothing?: A Little Help from another person to put on and taking off regular lower body clothing?: A Lot 6 Click Score: 17    End of Session Equipment Utilized During Treatment: Gait belt;Rolling walker (2 wheels)  OT Visit Diagnosis: Unsteadiness on feet (R26.81);Muscle weakness (generalized) (M62.81)   Activity Tolerance Patient tolerated treatment well   Patient Left in chair;with call bell/phone within reach;with chair alarm set   Nurse Communication Mobility status        Time: 1610-9604 OT Time Calculation (min): 25 min  Charges: OT General Charges $OT Visit: 1 Visit OT Treatments $Self Care/Home Management : 8-22 mins $Therapeutic Activity: 8-22 mins     Reuben Likes, OTR/L 11/07/2022, 3:02 PM

## 2022-11-07 NOTE — TOC Progression Note (Addendum)
Transition of Care [facility]) - Progression Note    Patient Details  Name: Kathleen Mejia MRN: [id number] Date of Birth: May 23, 1934  [facility] CM/SW Contact  Otelia Santee, LCSW Phone Number: 11/07/2022, 11:07 AM  Clinical Narrative:    Left voicemail with pt's son to review bed offers for SNF. Currently awaiting return call.   Update 2:30pm- Met with pt's son at bedside to review bed offers for SNF. Pt's daughter also present via t/c for discussion. Family have accepted bed offer for [facility]. Pt to be transitioned to PO abx on 6/5 and if tolerates may be ready for DC on 6/6 per EDD.   Insurance Berkley Harvey has been requested and currently pending approval.   Expected Discharge Plan:  (TBD) Barriers to Discharge: Continued Medical Work up  Expected Discharge Plan and Services In-house Referral: Clinical Social Work     Living arrangements for the past 2 months: Single Family Home                                       Social Determinants of Health (SDOH) Interventions SDOH Screenings   Food Insecurity: No Food Insecurity (11/03/2022)  Housing: Low Risk  (11/03/2022)  Transportation Needs: No Transportation Needs (11/03/2022)  Utilities: Not At Risk (11/03/2022)  Alcohol Screen: Low Risk  (04/14/2022)  Depression (PHQ2-9): Medium Risk (08/02/2022)  Financial Resource Strain: Low Risk  (04/14/2022)  Physical Activity: Inactive (04/14/2022)  Social Connections: Moderately Integrated (04/14/2022)  Stress: No Stress Concern Present (04/14/2022)  Tobacco Use: Low Risk  (11/03/2022)    Readmission Risk Interventions     No data to display

## 2022-11-08 DIAGNOSIS — R531 Weakness: Secondary | ICD-10-CM | POA: Diagnosis not present

## 2022-11-08 LAB — CULTURE, BLOOD (ROUTINE X 2)

## 2022-11-08 MED ORDER — ENOXAPARIN SODIUM 40 MG/0.4ML IJ SOSY
40.0000 mg | PREFILLED_SYRINGE | Freq: Every day | INTRAMUSCULAR | Status: DC
  Administered 2022-11-08: 40 mg via SUBCUTANEOUS
  Filled 2022-11-08: qty 0.4

## 2022-11-08 MED ORDER — CEPHALEXIN 500 MG PO CAPS
500.0000 mg | ORAL_CAPSULE | Freq: Two times a day (BID) | ORAL | Status: DC
  Administered 2022-11-08 – 2022-11-09 (×2): 500 mg via ORAL
  Filled 2022-11-08 (×2): qty 1

## 2022-11-08 NOTE — TOC Progression Note (Signed)
Transition of Care [facility]) - Progression Note    Patient Details  Name: Kathleen Mejia MRN: [id number] Date of Birth: [birth year]  Transition of Care [facility]) CM/SW Contact  Larrie Kass, LCSW Phone Number: 11/08/2022, 10:28 AM  Clinical Narrative:     Pt's insurance Berkley Harvey was approved.   Expected Discharge Plan:  (TBD) Barriers to Discharge: Continued Medical Work up  Expected Discharge Plan and Services In-house Referral: Clinical Social Work     Living arrangements for the past 2 months: Single Family Home                                       Social Determinants of Health (SDOH) Interventions SDOH Screenings   Food Insecurity: No Food Insecurity (11/03/2022)  Housing: Low Risk  (11/03/2022)  Transportation Needs: No Transportation Needs (11/03/2022)  Utilities: Not At Risk (11/03/2022)  Alcohol Screen: Low Risk  (04/14/2022)  Depression (PHQ2-9): Medium Risk (08/02/2022)  Financial Resource Strain: Low Risk  (04/14/2022)  Physical Activity: Inactive (04/14/2022)  Social Connections: Moderately Integrated (04/14/2022)  Stress: No Stress Concern Present (04/14/2022)  Tobacco Use: Low Risk  (11/03/2022)    Readmission Risk Interventions     No data to display

## 2022-11-08 NOTE — Plan of Care (Signed)

## 2022-11-08 NOTE — Progress Notes (Signed)
Physical Therapy Treatment Patient Details Name: Kathleen Mejia MRN: [id number] DOB: 1934/05/16 Today's Date: 11/08/2022   History of Present Illness Kathleen Mejia is a 89 y.o. female with medical history significant of anxiety, Alzheimer's dementia, hypertension, severe mitral regurgitation, chronic systolic congestive heart failure, hyperlipidemia was brought into the emergency department by her son due to significant generalized weakness. B LE cellulitis noted.    PT Comments    Pt progressing well this session. Tolerated incr gait distance and reported no pain in LEs. Pt requires occasional repetitious cues due decr STM and redirection to task, but  is able to follow commands during session. Continue PT POC in acute setting, d/c plan remains appropriate  Recommendations for follow up therapy are one component of a multi-disciplinary discharge planning process, led by the attending physician.  Recommendations may be updated based on patient status, additional functional criteria and insurance authorization.  Follow Up Recommendations  Can patient physically be transported by private vehicle: No    Assistance Recommended at Discharge Frequent or constant Supervision/Assistance  Patient can return home with the following A little help with walking and/or transfers;A little help with bathing/dressing/bathroom;Direct supervision/assist for medications management;Assistance with cooking/housework   Equipment Recommendations  None recommended by PT    Recommendations for Other Services       Precautions / Restrictions Precautions Precautions: Fall Restrictions Weight Bearing Restrictions: No     Mobility  Bed Mobility               General bed mobility comments: pt received in recliner    Transfers Overall transfer level: Needs assistance Equipment used: Rolling walker (2 wheels) Transfers: Sit to/from Stand Sit to Stand: Min assist           General transfer  comment: verbal cues for hand placement, assist to rise and control descent    Ambulation/Gait Ambulation/Gait assistance: Min assist Gait Distance (Feet): 30 Feet Assistive device: Rolling walker (2 wheels) Gait Pattern/deviations: Step-through pattern, Decreased stride length, Trunk flexed, Narrow base of support       General Gait Details: verbal cues for RW positioning, posture; pt reported fatigue and requested rest however was able to continue a greater distance with encouragement-prior to needing seated rest after 30'   Stairs             Wheelchair Mobility    Modified Rankin (Stroke Patients Only)       Balance     Sitting balance-Leahy Scale: Good     Standing balance support: Bilateral upper extremity supported, Reliant on assistive device for balance Standing balance-Leahy Scale: Poor                              Cognition Arousal/Alertness: Awake/alert Behavior During Therapy: WFL for tasks assessed/performed Overall Cognitive Status: History of cognitive impairments - at baseline Area of Impairment: Memory                     Memory: Decreased short-term memory         General Comments: hx Alzheimer's dementia, able to follow simple commands, requires repeated cues at times        Exercises General Exercises - Lower Extremity Long Arc Quad: AROM, Both, 10 reps    General Comments        Pertinent Vitals/Pain Pain Assessment Pain Assessment: Faces Faces Pain Scale: No hurt    Home Living  Prior Function            PT Goals (current goals can now be found in the care plan section) Acute Rehab PT Goals Patient Stated Goal: to go to sleep and rest PT Goal Formulation: Patient unable to participate in goal setting Time For Goal Achievement: 11/18/22 Potential to Achieve Goals: Good Progress towards PT goals: Progressing toward goals    Frequency    Min 1X/week       PT Plan Current plan remains appropriate    Co-evaluation              AM-PAC PT "6 Clicks" Mobility   Outcome Measure  Help needed turning from your back to your side while in a flat bed without using bedrails?: A Little Help needed moving from lying on your back to sitting on the side of a flat bed without using bedrails?: A Little Help needed moving to and from a bed to a chair (including a wheelchair)?: A Little Help needed standing up from a chair using your arms (e.g., wheelchair or bedside chair)?: A Little Help needed to walk in hospital room?: A Little Help needed climbing 3-5 steps with a railing? : A Lot 6 Click Score: 17    End of Session Equipment Utilized During Treatment: Gait belt Activity Tolerance: Patient tolerated treatment well Patient left: in chair;with call bell/phone within reach;with chair alarm set;with family/visitor present Nurse Communication: Mobility status PT Visit Diagnosis: Other abnormalities of gait and mobility (R26.89);Muscle weakness (generalized) (M62.81) Pain - part of body: Ankle and joints of foot     Time: 1610-9604 PT Time Calculation (min) (ACUTE ONLY): 27 min  Charges:  $Gait Training: 23-37 mins                     Darshay Deupree, PT  Acute Rehab Dept [facility]) [phone]  11/08/2022    [facility] 11/08/2022, 4:34 PM

## 2022-11-08 NOTE — Progress Notes (Addendum)
PROGRESS NOTE   Kathleen Mejia  ZOX:[id number] DOB: Oct 24, 1933 DOA: 11/03/2022 PCP: Tresa Garter, MD  Brief Narrative:   89 year old white female uses a walker typically at home Genital prolapse + vag-hys + cystocele was repair 01/2019 Paroxysmal A-fib HLD Bilateral knee osteoarthritis in knees status post multiple injections Progressive memory issues with sundowning since February 2024 per PCP note on Aricept Chronic lower extremity edema BMI 31  She came to the [facility] hospital 5/31 for fluid weeping after having a callus shaved off the left foot and increasing pain discomfort in the left knee-she was not able to stand She had been taking Demadex every other day but had been more short of breath with increasing swelling   patient admitted for bilateral lower extremity cellulitis + acute decompensated bimodal heart failure  Hospital-Problem based course  Bilateral cellulitis lower extremities Completing ceftriaxone on 6/6--changed to Keflex 4 doses Wound reviewed as below  New HFrEF + HFpEF--global hypokinesis on echo With underlying cognitive issues unlikely good candidate for aggressive workup Diuretics changed from IV to p.o. torsemide 20 daily (home dose was 10 daily)  Paroxysmal A-fib previously CHADVASC >3 --on aspirin 81 She sounds like she is in sinus rhythm  Hypokalemia secondary to diuretics Replacing with K-Dur 40, add on the same and recheck in the morning once again  Mild sundowning--no combativeness She seems to sundown in the afternoons Continue Aricept 5 at bedtime-Haldol has been held  DVT prophylaxis: Lovenox Code Status: DNR Family Communication: Called the patient's son Quinci Perret on [phone]--discussed with him Disposition:  Status is: Inpatient Remains inpatient appropriate because: Requires further diuretics  Will require placement to skilled facility   Subjective:  Awake coherent x 1-she tells me that she is "at the  hospital" but cannot tell me which-she knows that she is in [place] She has good insight into her past and tells me she moved here from [place] 15 years ago She has been overall quite calm per nursing staff No specific other issues  Objective: Vitals:   11/07/22 1512 11/07/22 2157 11/08/22 0500 11/08/22 0541  BP: 133/71 (!) 121/91  139/77  Pulse: 100 97  98  Resp: 16 16  16   Temp: 98.6 F (37 C) 97.7 F (36.5 C)  97.8 F (36.6 C)  TempSrc:  Oral  Oral  SpO2: 100% 100%  98%  Weight:   72 kg   Height:        Intake/Output Summary (Last 24 hours) at 11/08/2022 0949 Last data filed at 11/08/2022 0900 Gross per 24 hour  Intake 340 ml  Output 1250 ml  Net -910 ml   Filed Weights   11/04/22 0500 11/05/22 0353 11/08/22 0500  Weight: 77.9 kg 75 kg 72 kg    Examination:  EOMI NCAT no focal deficit no icterus no pallor No wheeze rales rhonchi S1-S2 no murmur Abdomen soft no rebound I did not examine lower extremities as he had just been wrapped Power is 5/5   Data Reviewed: personally reviewed   CBC    Component Value Date/Time   WBC 9.5 11/07/2022 0359   RBC 4.23 11/07/2022 0359   HGB 13.8 11/07/2022 0359   HCT 41.6 11/07/2022 0359   PLT 207 11/07/2022 0359   MCV 98.3 11/07/2022 0359   MCH 32.6 11/07/2022 0359   MCHC 33.2 11/07/2022 0359   RDW 12.3 11/07/2022 0359   LYMPHSABS 1.7 02/02/2022 1039   MONOABS 0.7 02/02/2022 1039   EOSABS 0.1 02/02/2022 1039   BASOSABS  0.0 02/02/2022 1039      Latest Ref Rng & Units 11/07/2022    3:59 AM 11/06/2022    3:41 AM 11/05/2022    3:35 AM  CMP  Glucose 70 - 99 mg/dL 161  096  045   BUN 8 - 23 mg/dL 17  15  17    Creatinine 0.44 - 1.00 mg/dL 4.09  8.11  9.14   Sodium 135 - 145 mmol/L 137  135  136   Potassium 3.5 - 5.1 mmol/L 3.4  3.1  3.1   Chloride 98 - 111 mmol/L 98  98  99   CO2 22 - 32 mmol/L 28  28  27    Calcium 8.9 - 10.3 mg/dL 8.6  8.3  8.6      Radiology Studies: No results found.   Scheduled Meds:   aspirin EC  81 mg Oral Daily   cephALEXin  500 mg Oral Q12H   docusate sodium  100 mg Oral BID   donepezil  5 mg Oral QHS   enoxaparin (LOVENOX) injection  40 mg Subcutaneous QHS   potassium chloride  40 mEq Oral Daily   simvastatin  5 mg Oral QHS   torsemide  20 mg Oral Daily   Continuous Infusions:  cefTRIAXone (ROCEPHIN)  IV Stopped (11/07/22 1717)     LOS: 4 days   Time spent: 5  Rhetta Mura, MD [facility] To contact the attending provider between 7A-7P or the covering provider during after hours 7P-7A, please log into the web site [email or web address] and access using universal [place] password for that web site. If you do not have the password, please call the hospital operator.  11/08/2022, 9:49 AM

## 2022-11-09 DIAGNOSIS — L03116 Cellulitis of left lower limb: Secondary | ICD-10-CM | POA: Diagnosis not present

## 2022-11-09 DIAGNOSIS — E785 Hyperlipidemia, unspecified: Secondary | ICD-10-CM | POA: Diagnosis not present

## 2022-11-09 DIAGNOSIS — E876 Hypokalemia: Secondary | ICD-10-CM | POA: Diagnosis not present

## 2022-11-09 DIAGNOSIS — I4891 Unspecified atrial fibrillation: Secondary | ICD-10-CM | POA: Diagnosis not present

## 2022-11-09 DIAGNOSIS — L03115 Cellulitis of right lower limb: Secondary | ICD-10-CM | POA: Diagnosis not present

## 2022-11-09 DIAGNOSIS — I10 Essential (primary) hypertension: Secondary | ICD-10-CM | POA: Diagnosis not present

## 2022-11-09 DIAGNOSIS — I5020 Unspecified systolic (congestive) heart failure: Secondary | ICD-10-CM | POA: Diagnosis not present

## 2022-11-09 DIAGNOSIS — E669 Obesity, unspecified: Secondary | ICD-10-CM | POA: Diagnosis not present

## 2022-11-09 DIAGNOSIS — M170 Bilateral primary osteoarthritis of knee: Secondary | ICD-10-CM | POA: Diagnosis not present

## 2022-11-09 DIAGNOSIS — M545 Low back pain, unspecified: Secondary | ICD-10-CM | POA: Diagnosis not present

## 2022-11-09 DIAGNOSIS — F419 Anxiety disorder, unspecified: Secondary | ICD-10-CM | POA: Diagnosis not present

## 2022-11-09 DIAGNOSIS — G309 Alzheimer's disease, unspecified: Secondary | ICD-10-CM | POA: Diagnosis not present

## 2022-11-09 DIAGNOSIS — Z7401 Bed confinement status: Secondary | ICD-10-CM | POA: Diagnosis not present

## 2022-11-09 DIAGNOSIS — R600 Localized edema: Secondary | ICD-10-CM | POA: Diagnosis not present

## 2022-11-09 DIAGNOSIS — F0280 Dementia in other diseases classified elsewhere without behavioral disturbance: Secondary | ICD-10-CM | POA: Diagnosis not present

## 2022-11-09 DIAGNOSIS — I340 Nonrheumatic mitral (valve) insufficiency: Secondary | ICD-10-CM | POA: Diagnosis not present

## 2022-11-09 DIAGNOSIS — F05 Delirium due to known physiological condition: Secondary | ICD-10-CM | POA: Diagnosis not present

## 2022-11-09 DIAGNOSIS — R531 Weakness: Secondary | ICD-10-CM | POA: Diagnosis not present

## 2022-11-09 LAB — BASIC METABOLIC PANEL
Anion gap: 7 (ref 5–15)
BUN: 15 mg/dL (ref 8–23)
CO2: 29 mmol/L (ref 22–32)
Calcium: 8.5 mg/dL — ABNORMAL LOW (ref 8.9–10.3)
Chloride: 98 mmol/L (ref 98–111)
Creatinine, Ser: 0.74 mg/dL (ref 0.44–1.00)
GFR, Estimated: >60 mL/min (ref >60)
Glucose, Bld: 99 mg/dL (ref 70–99)
Potassium: 3.6 mmol/L (ref 3.5–5.1)
Sodium: 134 mmol/L — ABNORMAL LOW (ref 135–145)

## 2022-11-09 LAB — MAGNESIUM: Magnesium: 2.3 mg/dL (ref 1.7–2.4)

## 2022-11-09 LAB — CULTURE, BLOOD (ROUTINE X 2)

## 2022-11-09 MED ORDER — POTASSIUM CHLORIDE CRYS ER 20 MEQ PO TBCR: 40.0000 meq | EXTENDED_RELEASE_TABLET | Freq: Every day | ORAL | 0 refills | Status: DC

## 2022-11-09 MED ORDER — CEPHALEXIN 500 MG PO CAPS: 500.0000 mg | ORAL_CAPSULE | Freq: Two times a day (BID) | ORAL | 0 refills | Status: DC

## 2022-11-09 MED ORDER — DONEPEZIL HCL 5 MG PO TABS: ORAL_TABLET | ORAL | 3 refills | Status: DC

## 2022-11-09 MED ORDER — TORSEMIDE 20 MG PO TABS: 20.0000 mg | ORAL_TABLET | Freq: Every day | ORAL | Status: DC

## 2022-11-09 NOTE — Care Management Important Message (Signed)
Important Message  Patient Details IM Letter given. Name: Kathleen Mejia MRN: [id number] Date of Birth: [birth year]   Medicare Important Message Given:  Yes     Caren Macadam 11/09/2022, 10:58 AM

## 2022-11-09 NOTE — Discharge Summary (Signed)
Physician Discharge Summary  Chelci Haughney Abrams ZOX:[id number] DOB: 04-14-1934 DOA: 11/03/2022  PCP: Tresa Garter, MD  Admit date: 11/03/2022 Discharge date: 11/09/2022  Time spent: 46 minutes  Recommendations for Outpatient Follow-up:  Requires wound care as dictated below Recommend close monitoring of fluid balance and adjustment of torsemide dose based on labs in 1 week as well as clinical appearance--she is going home on torsemide 20 which is double her usual dose to help keep her out of heart failure and lower extremity swelling ?  A-fib diagnosis previously-only on aspirin once daily for unclear reasons needs further follow-up in the outpatient setting with cardiology-skilled physician to kindly arrange  Discharge Diagnoses:  MAIN problem for hospitalization   Bilateral lower extremity cellulitis Decompensated diastolic systolic HF Paroxysmal A-fib? Hypokalemia secondary to diuresis Mild sundowning without any combativeness and pleasantly redirectable  Please see below for itemized issues addressed in HOpsital- refer to other progress notes for clarity if needed  Discharge Condition: Improved  Diet recommendation: Heart healthy low-salt  Filed Weights   11/05/22 0353 11/08/22 0500 11/09/22 0500  Weight: 75 kg 72 kg 71.8 kg    History of present illness:  89 year old white female uses a walker typically at home Genital prolapse + vag-hys + cystocele was repair 01/2019 Paroxysmal A-fib HLD Bilateral knee osteoarthritis in knees status post multiple injections Progressive memory issues with sundowning since February 2024 per PCP note on Aricept Chronic lower extremity edema BMI 31   She came [facility] the [facility] hospital 5/31 for fluid weeping after having a callus shaved off the left foot and increasing pain discomfort in the left knee-she was not able to stand She had been taking Demadex every other day but had been more short of breath with increasing swelling     patient admitted for bilateral lower extremity cellulitis + acute decompensated bimodal heart failure  Hospital Course:  Bilateral cellulitis lower extremities Completing ceftriaxone on 6/6--changed to Keflex 4 doses Wound reviewed as below   New HFrEF + HFpEF--global hypokinesis on echo With underlying cognitive issues unlikely good candidate for aggressive workup Diuretics changed from IV to p.o. torsemide 20 daily (home dose was 10 daily) Patient requires close monitoring of fluid balance, requires salt limitation in diet and requires titration of p.o. torsemide either up or down at skilled facility with daily weights preferably standing If the patient weighs more than 2 pounds on any given day would suggest addition of torsemide 10 If the patient develops volume depletion may need to cut back dosing or change to every other day   Paroxysmal A-fib previously CHADVASC >3 --on aspirin 81 She sounds like she is in sinus rhythm Would defer to PCP regarding AC vs placing a loop monitor depending on OP GOC discussions I told her son NOT to use asa 81 bid---[she had been on that dose at home] only 81 daily   Hypokalemia secondary to diuretics Replacing with K-Dur 40--- will discharge on potassium replacement   Mild sundowning--no combativeness She seems to sundown in the afternoons Continue Aricept 5 at bedtime   Discharge Exam: Vitals:   11/08/22 2135 11/09/22 0607  BP:  (!) 143/99  Pulse: (!) 102 100  Resp: 18 18  Temp: 98 F (36.7 C) 98.2 F (36.8 C)  SpO2: 98% 99%    Subj on day of d/c   Awake coherent no distress sitting up doing crossword and having her pancake  No distress Confabulates a little--thinks her son brought her here Very redirectable S1  s2--seems to be in NSR on exam Cta b no added sound no wheeze rales rhonchi Neuro intact no focal deficit  General Exam on discharge         Discharge Instructions   Discharge Instructions     Diet - low  sodium heart healthy   Complete by: As directed    Discharge wound care:   Complete by: As directed    Wound care  Daily      Comments: Wound care to bilateral LEs:  Wash LEs (particularly between digits) with soap and water, rinse and dry. Ensure interdigital spaces are dry prior to performing wound care. Cleanse wounds to right lateral LE, posterior left LE and plantar left foot at metatarsal head with NS, pat dry. Cover with silver hydrofiber (Aquacel Ag+ Advantage, Lawson # [id number]). Top with ABD pads (gauze 4x4s to left plantar foot). Secure dressings by applying Kerlix roll gauze from just below toes to just below knees. Top Kerlix with 6-inch ACE bandages applied in a similar manner.  Apply "strips" of Aquacel Ag+ between digits. Place feet into Prevalon boots.  11/05/22 0730   Increase activity slowly   Complete by: As directed       Allergies as of 11/09/2022       Reactions   Eliquis [apixaban] Other (See Comments)   Caused headaches and aching   Losartan    "all side effects and an article from [employer] re Valsartan recall"   Maxzide [hydrochlorothiazide W-triamterene]    Cramps w/a high dose   Metoprolol Other (See Comments)   Caused aching        Medication List     STOP taking these medications    bumetanide 0.5 MG tablet Commonly known as: BUMEX   CO Q 10 PO   CRANBERRY PO   FLAXSEED (LINSEED) PO   Garlic 500 MG Caps   ketoconazole 200 MG tablet Commonly known as: NIZORAL   MULTI-MINERALS PO   potassium chloride 10 MEQ tablet Commonly known as: KLOR-CON   Vitamin Deficiency System-B12 1000 MCG/ML Kit Generic drug: Cyanocobalamin       TAKE these medications    acetaminophen 500 MG tablet Commonly known as: TYLENOL Take 500 mg by mouth See admin instructions. Take 500 mg by mouth in the morning and with supper/evening meal   aspirin EC 81 MG tablet Take 81 mg by mouth See admin instructions. Take 81 mg by mouth in the morning and with  supper/evening meal   cephALEXin 500 MG capsule Commonly known as: KEFLEX Take 1 capsule (500 mg total) by mouth every 12 (twelve) hours.   Cod Liver Oil 1000 MG Caps Take 1,000 mg by mouth daily.   docusate sodium 100 MG capsule Commonly known as: COLACE Take 1 capsule (100 mg total) by mouth 2 (two) times daily.   donepezil 5 MG tablet Commonly known as: ARICEPT TAKE 1 TABLET(5 MG) BY MOUTH AT BEDTIME   ketoconazole 2 % cream Commonly known as: NIZORAL Apply 1 Application topically 2 (two) times daily.   polyethylene glycol 17 g packet Commonly known as: MIRALAX / GLYCOLAX Take 17 g by mouth daily as needed for mild constipation.   potassium chloride SA 20 MEQ tablet Commonly known as: KLOR-CON M Take 2 tablets (40 mEq total) by mouth daily.   simvastatin 5 MG tablet Commonly known as: ZOCOR Take 1 tablet (5 mg total) by mouth at bedtime.   torsemide 20 MG tablet Commonly known as: DEMADEX Take 1 tablet (20  mg total) by mouth daily. What changed:  medication strength See the new instructions.   vitamin C 1000 MG tablet Take 1,000 mg by mouth daily.   ZINC 15 PO Take 50 mg by mouth daily.               Discharge Care Instructions  (From admission, onward)           Start     Ordered   11/09/22 0000  Discharge wound care:       Comments: Wound care  Daily      Comments: Wound care to bilateral LEs:  Wash LEs (particularly between digits) with soap and water, rinse and dry. Ensure interdigital spaces are dry prior to performing wound care. Cleanse wounds to right lateral LE, posterior left LE and plantar left foot at metatarsal head with NS, pat dry. Cover with silver hydrofiber (Aquacel Ag+ Advantage, Lawson # [id number]). Top with ABD pads (gauze 4x4s to left plantar foot). Secure dressings by applying Kerlix roll gauze from just below toes to just below knees. Top Kerlix with 6-inch ACE bandages applied in a similar manner.  Apply "strips" of Aquacel Ag+  between digits. Place feet into Prevalon boots.  11/05/22 0730   11/09/22 0939           Allergies  Allergen Reactions   Eliquis [Apixaban] Other (See Comments)    Caused headaches and aching   Losartan     "all side effects and an article from [employer] re Valsartan recall"   Maxzide [Hydrochlorothiazide W-Triamterene]     Cramps w/a high dose   Metoprolol Other (See Comments)    Caused aching     Contact information for after-discharge care     Destination     [facility] .   Service: Skilled Nursing Contact information: [address] [place] [address] [phone]                      The results of significant diagnostics from this hospitalization (including imaging, microbiology, ancillary and laboratory) are listed below for reference.    Significant Diagnostic Studies: ECHOCARDIOGRAM COMPLETE  Result Date: 11/04/2022    ECHOCARDIOGRAM REPORT   Patient Name:   BONNITA GLATTER Date of Exam: 11/04/2022 Medical Rec #:  [id number]         Height:       61.0 in Accession #:    [id number]        Weight:       171.7 lb Date of Birth:  [birth year]         BSA:          1.770 m Patient Age:    89 years          BP:           119/64 mmHg Patient Gender: F                 HR:           82 bpm. Exam Location:  Inpatient Procedure: 2D Echo, 3D Echo, Strain Analysis, Cardiac Doppler and Color Doppler Indications:    I50.21 CHF  History:        Patient has prior history of Echocardiogram examinations, most                 recent 07/18/2020. TIA, Mitral Valve Disease, Arrythmias:PVC;  Risk Factors:Non-Smoker, Hypertension and Dyslipidemia.  Sonographer:    Dondra Prader RVT RCS Referring Phys: [id number] RAVI PAHWANI IMPRESSIONS  1. Left ventricular ejection fraction, by estimation, is 35 to 40%. The left ventricle has moderately decreased function. The left ventricle demonstrates global hypokinesis. The left ventricular internal cavity  size was mildly dilated. Left ventricular diastolic parameters are consistent with Grade I diastolic dysfunction (impaired relaxation). The average left ventricular global longitudinal strain is -5.0 %. The global longitudinal strain is abnormal.  2. Right ventricular systolic function is normal. The right ventricular size is mildly enlarged. There is normal pulmonary artery systolic pressure. The estimated right ventricular systolic pressure is 28.0 mmHg.  3. Left atrial size was mildly dilated.  4. Right atrial size was mildly dilated.  5. The mitral valve is normal in structure. Mild mitral valve regurgitation. No evidence of mitral stenosis.  6. Tricuspid valve regurgitation is mild to moderate.  7. The aortic valve is tricuspid. Aortic valve regurgitation is mild. No aortic stenosis is present.  8. The inferior vena cava is normal in size with greater than 50% respiratory variability, suggesting right atrial pressure of 3 mmHg. Comparison(s): 07/18/20 EF 45-50%. FINDINGS  Left Ventricle: Left ventricular ejection fraction, by estimation, is 35 to 40%. The left ventricle has moderately decreased function. The left ventricle demonstrates global hypokinesis. The average left ventricular global longitudinal strain is -5.0 %.  The global longitudinal strain is abnormal. The left ventricular internal cavity size was mildly dilated. There is no left ventricular hypertrophy. Left ventricular diastolic parameters are consistent with Grade I diastolic dysfunction (impaired relaxation). Right Ventricle: The right ventricular size is mildly enlarged. No increase in right ventricular wall thickness. Right ventricular systolic function is normal. There is normal pulmonary artery systolic pressure. The tricuspid regurgitant velocity is 2.50  m/s, and with an assumed right atrial pressure of 3 mmHg, the estimated right ventricular systolic pressure is 28.0 mmHg. Left Atrium: Left atrial size was mildly dilated. Right Atrium:  Right atrial size was mildly dilated. Pericardium: There is no evidence of pericardial effusion. Mitral Valve: The mitral valve is normal in structure. Mild mitral valve regurgitation. No evidence of mitral valve stenosis. Tricuspid Valve: The tricuspid valve is normal in structure. Tricuspid valve regurgitation is mild to moderate. No evidence of tricuspid stenosis. Aortic Valve: The aortic valve is tricuspid. Aortic valve regurgitation is mild. Aortic regurgitation PHT measures 696 msec. No aortic stenosis is present. Aortic valve mean gradient measures 5.0 mmHg. Aortic valve peak gradient measures 9.5 mmHg. Aortic  valve area, by VTI measures 1.95 cm. Pulmonic Valve: The pulmonic valve was normal in structure. Pulmonic valve regurgitation is trivial. No evidence of pulmonic stenosis. Aorta: The aortic root is normal in size and structure. Venous: The inferior vena cava is normal in size with greater than 50% respiratory variability, suggesting right atrial pressure of 3 mmHg. IAS/Shunts: No atrial level shunt detected by color flow Doppler.  LEFT VENTRICLE PLAX 2D LVIDd:         5.40 cm LVIDs:         4.30 cm     2D Longitudinal Strain LV PW:         1.30 cm     2D Strain GLS Avg:     -5.0 % LV IVS:        1.10 cm LVOT diam:     2.00 cm LV SV:         51 LV SV Index:   29 LVOT Area:  3.14 cm  LV Volumes (MOD) LV vol d, MOD A2C: 76.8 ml LV vol d, MOD A4C: 92.9 ml LV vol s, MOD A2C: 47.0 ml LV vol s, MOD A4C: 52.2 ml LV SV MOD A2C:     29.8 ml LV SV MOD A4C:     92.9 ml LV SV MOD BP:      36.2 ml RIGHT VENTRICLE            IVC RV Basal diam:  3.95 cm    IVC diam: 1.30 cm RV Mid diam:    3.20 cm RV S prime:     8.56 cm/s TAPSE (M-mode): 1.4 cm LEFT ATRIUM             Index        RIGHT ATRIUM           Index LA diam:        4.00 cm 2.26 cm/m   RA Area:     18.70 cm LA Vol (A2C):   85.2 ml 48.13 ml/m  RA Volume:   58.90 ml  33.27 ml/m LA Vol (A4C):   74.1 ml 41.83 ml/m LA Biplane Vol: 81.7 ml 46.15 ml/m   AORTIC VALVE                     PULMONIC VALVE AV Area (Vmax):    2.04 cm      PV Vmax:       0.75 m/s AV Area (Vmean):   2.05 cm      PV Peak grad:  2.2 mmHg AV Area (VTI):     1.95 cm AV Vmax:           154.00 cm/s AV Vmean:          103.000 cm/s AV VTI:            0.261 m AV Peak Grad:      9.5 mmHg AV Mean Grad:      5.0 mmHg LVOT Vmax:         99.80 cm/s LVOT Vmean:        67.300 cm/s LVOT VTI:          0.162 m LVOT/AV VTI ratio: 0.62 AI PHT:            696 msec AR Vena Contracta: 0.40 cm  AORTA Ao Root diam: 2.90 cm Ao Asc diam:  3.50 cm MITRAL VALVE               TRICUSPID VALVE MV Area (PHT): 3.56 cm    TR Peak grad:   25.0 mmHg MV Decel Time: 213 msec    TR Mean grad:   18.0 mmHg MR Peak grad: 70.2 mmHg    TR Vmax:        250.00 cm/s MR Mean grad: 48.0 mmHg    TR Vmean:       200.0 cm/s MR Vmax:      419.00 cm/s MR Vmean:     327.0 cm/s   SHUNTS MV E velocity: 79.80 cm/s  Systemic VTI:  0.16 m MV A velocity: 28.10 cm/s  Systemic Diam: 2.00 cm MV E/A ratio:  2.84 Donato Schultz MD Electronically signed by Donato Schultz MD Signature Date/Time: 11/04/2022/11:03:25 AM    Final    VAS [place] LOWER EXTREMITY VENOUS (DVT) (7a-7p)  Result Date: 11/03/2022  Lower Venous DVT Study Patient Name:  RUTHINE ZIERDEN  Date of Exam:   11/03/2022 Medical Rec #:  [id number]          Accession #:    [id number] Date of Birth: [birth year]          Patient Gender: F Patient Age:   39 years Exam Location:  [facility] Procedure:      VAS [place] LOWER EXTREMITY VENOUS (DVT) Referring Phys: JON KNAPP --------------------------------------------------------------------------------  Indications: Swelling.  Risk Factors: None identified. Limitations: Body habitus and poor ultrasound/tissue interface. Comparison Study: No prior studies. Performing Technologist: Chanda Busing RVT  Examination Guidelines: A complete evaluation includes B-mode imaging, spectral Doppler, color Doppler, and power Doppler as needed of all accessible  portions of each vessel. Bilateral testing is considered an integral part of a complete examination. Limited examinations for reoccurring indications may be performed as noted. The reflux portion of the exam is performed with the patient in reverse Trendelenburg.  +-----+---------------+---------+-----------+----------+--------------+ RIGHTCompressibilityPhasicitySpontaneityPropertiesThrombus Aging +-----+---------------+---------+-----------+----------+--------------+ CFV  Full           Yes      Yes                                 +-----+---------------+---------+-----------+----------+--------------+   +---------+---------------+---------+-----------+----------+-------------------+ LEFT     CompressibilityPhasicitySpontaneityPropertiesThrombus Aging      +---------+---------------+---------+-----------+----------+-------------------+ CFV      Full           Yes      Yes                                      +---------+---------------+---------+-----------+----------+-------------------+ SFJ      Full                                                             +---------+---------------+---------+-----------+----------+-------------------+ FV Prox  Full                                                             +---------+---------------+---------+-----------+----------+-------------------+ FV Mid   Full                                                             +---------+---------------+---------+-----------+----------+-------------------+ FV Distal               Yes      Yes                                      +---------+---------------+---------+-----------+----------+-------------------+ PFV      Full                                                             +---------+---------------+---------+-----------+----------+-------------------+  POP      Full           Yes      Yes                                       +---------+---------------+---------+-----------+----------+-------------------+ PTV      Full                                                             +---------+---------------+---------+-----------+----------+-------------------+ PERO                                                  Not well visualized +---------+---------------+---------+-----------+----------+-------------------+    Summary: RIGHT: - No evidence of common femoral vein obstruction.  LEFT: - There is no evidence of deep vein thrombosis in the lower extremity. However, portions of this examination were limited- see technologist comments above.  - No cystic structure found in the popliteal fossa.  *See table(s) above for measurements and observations. Electronically signed by Sherald Hess MD on 11/03/2022 at 4:40:29 PM.    Final    DG Chest Portable 1 View  Result Date: 11/03/2022 CLINICAL DATA:  weakness, swelling EXAM: PORTABLE CHEST - 1 VIEW COMPARISON:  04/06/2014 FINDINGS: Heart is mildly enlarged.  No pulmonary vascular congestion. Lungs are clear. IMPRESSION: Mild cardiomegaly. Electronically Signed   By: Acquanetta Belling M.D.   On: 11/03/2022 15:59   DG Knee 2 Views Left  Result Date: 11/03/2022 CLINICAL DATA:  Increased leg swelling. Stood up at physician's appointment and leg gave out. EXAM: LEFT KNEE - 1-2 VIEW COMPARISON:  None available FINDINGS: No fracture or dislocation. Soft tissues are unremarkable. Moderate tricompartmental osteoarthrosis. IMPRESSION: No acute abnormality of the left knee. Electronically Signed   By: Acquanetta Belling M.D.   On: 11/03/2022 15:58    Microbiology: Recent Results (from the past 240 hour(s))  Culture, blood (Routine X 2) w Reflex to ID Panel     Status: None   Collection Time: 11/03/22 11:26 PM   Specimen: BLOOD  Result Value Ref Range Status   Specimen Description   Final    BLOOD BLOOD RIGHT ARM Performed at [facility], [address],  [place], [place] [address]    Special Requests   Final    BOTTLES DRAWN AEROBIC AND ANAEROBIC Blood Culture adequate volume Performed at [facility], [address], [place], [place] [address]    Culture   Final    NO GROWTH 5 DAYS Performed at [facility], [address], [place], [place] [address]    Report Status 11/09/2022 FINAL  Final  Culture, blood (Routine X 2) w Reflex to ID Panel     Status: None   Collection Time: 11/03/22 11:26 PM   Specimen: BLOOD  Result Value Ref Range Status   Specimen Description   Final    BLOOD BLOOD RIGHT HAND Performed at [facility], [address] [place], [place] [address]    Special Requests   Final    BOTTLES DRAWN AEROBIC  ONLY Blood Culture adequate volume Performed at [facility], [address] [place], [place] [address]    Culture   Final    NO GROWTH 5 DAYS Performed at [facility], [address] [place], [place] [address]    Report Status 11/09/2022 FINAL  Final     Labs: Basic Metabolic Panel: Recent Labs  Lab 11/03/22 2326 11/04/22 0407 11/05/22 0335 11/06/22 0341 11/07/22 0359 11/09/22 0408  NA  --  137 136 135 137 134*  K  --  3.1* 3.1* 3.1* 3.4* 3.6  CL  --  98 99 98 98 98  CO2  --  28 27 28 28 29   GLUCOSE  --  121* 109* 107* 109* 99  BUN  --  17 17 15 17 15   CREATININE 0.65 0.78 0.67 0.62 0.68 0.74  CALCIUM  --  8.7* 8.6* 8.3* 8.6* 8.5*  MG 2.2  --   --   --   --  2.3   Liver Function Tests: Recent Labs  Lab 11/04/22 0407  AST 20  ALT 14  ALKPHOS 68  BILITOT 1.4*  PROT 6.5  ALBUMIN 3.6   No results for input(s): "LIPASE", "AMYLASE" in the last 168 hours. No results for input(s): "AMMONIA" in the last 168 hours. CBC: Recent Labs  Lab 11/03/22 1505 11/03/22 2326 11/04/22 0407 11/05/22 0335 11/07/22 0359  WBC 10.6* 10.7* 9.7 10.5 9.5  HGB 15.7* 15.8* 15.2* 14.1 13.8  HCT 47.7* 47.7* 45.6 43.0 41.6  MCV 98.1 99.4 99.1  100.0 98.3  PLT 238 212 204 198 207   Cardiac Enzymes: No results for input(s): "CKTOTAL", "CKMB", "CKMBINDEX", "TROPONINI" in the last 168 hours. BNP: BNP (last 3 results) Recent Labs    11/03/22 1505  BNP 252.6*    ProBNP (last 3 results) No results for input(s): "PROBNP" in the last 8760 hours.  CBG: Recent Labs  Lab 11/05/22 0753  GLUCAP 111*       Signed:  Rhetta Mura MD   [facility] 11/09/2022, 9:39 AM

## 2022-11-09 NOTE — Plan of Care (Signed)
Problem: Education: Goal: Knowledge of General Education information will improve Description: Including pain rating scale, medication(s)/side effects and non-pharmacologic comfort measures 11/09/2022 1145 by Kerby Nora, RN Outcome: Adequate for Discharge 11/09/2022 0745 by Kerby Nora, RN Outcome: Progressing   Problem: Health Behavior/Discharge Planning: Goal: Ability to manage health-related needs will improve 11/09/2022 1145 by Kerby Nora, RN Outcome: Adequate for Discharge 11/09/2022 0745 by Kerby Nora, RN Outcome: Progressing   Problem: Clinical Measurements: Goal: Ability to maintain clinical measurements within normal limits will improve 11/09/2022 1145 by Kerby Nora, RN Outcome: Adequate for Discharge 11/09/2022 0745 by Kerby Nora, RN Outcome: Progressing Goal: Will remain free from infection 11/09/2022 1145 by Kerby Nora, RN Outcome: Adequate for Discharge 11/09/2022 0745 by Kerby Nora, RN Outcome: Progressing Goal: Diagnostic test results will improve 11/09/2022 1145 by Kerby Nora, RN Outcome: Adequate for Discharge 11/09/2022 0745 by Kerby Nora, RN Outcome: Progressing Goal: Respiratory complications will improve 11/09/2022 1145 by Kerby Nora, RN Outcome: Adequate for Discharge 11/09/2022 0745 by Kerby Nora, RN Outcome: Progressing Goal: Cardiovascular complication will be avoided 11/09/2022 1145 by Kerby Nora, RN Outcome: Adequate for Discharge 11/09/2022 0745 by Kerby Nora, RN Outcome: Progressing   Problem: Activity: Goal: Risk for activity intolerance will decrease 11/09/2022 1145 by Kerby Nora, RN Outcome: Adequate for Discharge 11/09/2022 0745 by Kerby Nora, RN Outcome: Progressing   Problem: Nutrition: Goal: Adequate nutrition will be maintained 11/09/2022 1145 by Kerby Nora, RN Outcome: Adequate for Discharge 11/09/2022 0745 by Kerby Nora, RN Outcome: Progressing   Problem: Coping: Goal: Level of anxiety  will decrease 11/09/2022 1145 by Kerby Nora, RN Outcome: Adequate for Discharge 11/09/2022 0745 by Kerby Nora, RN Outcome: Progressing   Problem: Elimination: Goal: Will not experience complications related to bowel motility 11/09/2022 1145 by Kerby Nora, RN Outcome: Adequate for Discharge 11/09/2022 0745 by Kerby Nora, RN Outcome: Progressing Goal: Will not experience complications related to urinary retention 11/09/2022 1145 by Kerby Nora, RN Outcome: Adequate for Discharge 11/09/2022 0745 by Kerby Nora, RN Outcome: Progressing   Problem: Pain Managment: Goal: General experience of comfort will improve 11/09/2022 1145 by Kerby Nora, RN Outcome: Adequate for Discharge 11/09/2022 0745 by Kerby Nora, RN Outcome: Progressing   Problem: Safety: Goal: Ability to remain free from injury will improve 11/09/2022 1145 by Kerby Nora, RN Outcome: Adequate for Discharge 11/09/2022 0745 by Kerby Nora, RN Outcome: Progressing   Problem: Skin Integrity: Goal: Risk for impaired skin integrity will decrease 11/09/2022 1145 by Kerby Nora, RN Outcome: Adequate for Discharge 11/09/2022 0745 by Kerby Nora, RN Outcome: Progressing   Problem: Acute Rehab PT Goals(only PT should resolve) Goal: Pt will Roll Supine to Side Outcome: Adequate for Discharge Goal: Pt Will Go Supine/Side To Sit Outcome: Adequate for Discharge Goal: Patient Will Transfer Sit To/From Stand Outcome: Adequate for Discharge Goal: Pt Will Transfer Bed To Chair/Chair To Bed Outcome: Adequate for Discharge Goal: Pt Will Ambulate Outcome: Adequate for Discharge   Problem: Acute Rehab OT Goals (only OT should resolve) Goal: Pt. Will Perform Grooming Outcome: Adequate for Discharge Goal: Pt. Will Perform Lower Body Dressing Outcome: Adequate for Discharge Goal: Pt. Will Transfer To Toilet Outcome: Adequate for Discharge Goal: Pt. Will Perform Toileting-Clothing Manipulation Outcome: Adequate  for Discharge   Problem: Education: Goal: Knowledge of General Education information will improve Description: Including pain rating scale, medication(s)/side  effects and non-pharmacologic comfort measures 11/09/2022 1145 by Kerby Nora, RN Outcome: Adequate for Discharge 11/09/2022 0745 by Kerby Nora, RN Outcome: Progressing   Problem: Health Behavior/Discharge Planning: Goal: Ability to manage health-related needs will improve 11/09/2022 1145 by Kerby Nora, RN Outcome: Adequate for Discharge 11/09/2022 0745 by Kerby Nora, RN Outcome: Progressing   Problem: Clinical Measurements: Goal: Ability to maintain clinical measurements within normal limits will improve 11/09/2022 1145 by Kerby Nora, RN Outcome: Adequate for Discharge 11/09/2022 0745 by Kerby Nora, RN Outcome: Progressing Goal: Will remain free from infection 11/09/2022 1145 by Kerby Nora, RN Outcome: Adequate for Discharge 11/09/2022 0745 by Kerby Nora, RN Outcome: Progressing Goal: Diagnostic test results will improve 11/09/2022 1145 by Kerby Nora, RN Outcome: Adequate for Discharge 11/09/2022 0745 by Kerby Nora, RN Outcome: Progressing Goal: Respiratory complications will improve 11/09/2022 1145 by Kerby Nora, RN Outcome: Adequate for Discharge 11/09/2022 0745 by Kerby Nora, RN Outcome: Progressing Goal: Cardiovascular complication will be avoided 11/09/2022 1145 by Kerby Nora, RN Outcome: Adequate for Discharge 11/09/2022 0745 by Kerby Nora, RN Outcome: Progressing   Problem: Activity: Goal: Risk for activity intolerance will decrease 11/09/2022 1145 by Kerby Nora, RN Outcome: Adequate for Discharge 11/09/2022 0745 by Kerby Nora, RN Outcome: Progressing   Problem: Nutrition: Goal: Adequate nutrition will be maintained 11/09/2022 1145 by Kerby Nora, RN Outcome: Adequate for Discharge 11/09/2022 0745 by Kerby Nora, RN Outcome: Progressing   Problem: Coping: Goal:  Level of anxiety will decrease 11/09/2022 1145 by Kerby Nora, RN Outcome: Adequate for Discharge 11/09/2022 0745 by Kerby Nora, RN Outcome: Progressing   Problem: Elimination: Goal: Will not experience complications related to bowel motility 11/09/2022 1145 by Kerby Nora, RN Outcome: Adequate for Discharge 11/09/2022 0745 by Kerby Nora, RN Outcome: Progressing Goal: Will not experience complications related to urinary retention 11/09/2022 1145 by Kerby Nora, RN Outcome: Adequate for Discharge 11/09/2022 0745 by Kerby Nora, RN Outcome: Progressing   Problem: Pain Managment: Goal: General experience of comfort will improve 11/09/2022 1145 by Kerby Nora, RN Outcome: Adequate for Discharge 11/09/2022 0745 by Kerby Nora, RN Outcome: Progressing   Problem: Safety: Goal: Ability to remain free from injury will improve 11/09/2022 1145 by Kerby Nora, RN Outcome: Adequate for Discharge 11/09/2022 0745 by Kerby Nora, RN Outcome: Progressing   Problem: Skin Integrity: Goal: Risk for impaired skin integrity will decrease 11/09/2022 1145 by Kerby Nora, RN Outcome: Adequate for Discharge 11/09/2022 0745 by Kerby Nora, RN Outcome: Progressing   Problem: Acute Rehab PT Goals(only PT should resolve) Goal: Pt will Roll Supine to Side Outcome: Adequate for Discharge Goal: Pt Will Go Supine/Side To Sit Outcome: Adequate for Discharge Goal: Patient Will Transfer Sit To/From Stand Outcome: Adequate for Discharge Goal: Pt Will Transfer Bed To Chair/Chair To Bed Outcome: Adequate for Discharge Goal: Pt Will Ambulate Outcome: Adequate for Discharge   Problem: Acute Rehab OT Goals (only OT should resolve) Goal: Pt. Will Perform Grooming Outcome: Adequate for Discharge Goal: Pt. Will Perform Lower Body Dressing Outcome: Adequate for Discharge Goal: Pt. Will Transfer To Toilet Outcome: Adequate for Discharge Goal: Pt. Will Perform Toileting-Clothing  Manipulation Outcome: Adequate for Discharge

## 2022-11-09 NOTE — Progress Notes (Signed)
RN notified pt son, Maurine Minister, that [facility] was at bedside to transport pt to the facility. Dennis planning to meet pt at the facility. RN attempted to call report x2 to the facility. Voicemail was left for social worker at the facility to give a return call for a nurse report. Belongings were packed and sent home with the pt. IV was discontinued.

## 2022-11-09 NOTE — Plan of Care (Signed)

## 2022-11-09 NOTE — TOC Transition Note (Signed)
Transition of Care [facility]) - CM/SW Discharge Note   Patient Details  Name: Kathleen Mejia MRN: [id number] Date of Birth: 1934/04/30  [facility] CM/SW Contact:  Otelia Santee, LCSW Phone Number: 11/09/2022, 10:51 AM   Clinical Narrative:    Pt to transfer to [facility] for [facility] SN[facility]. Pt will be going to room 124. RN to call report to [phone]. Spoke with pt's son and confirmed discharge plans. DC packet with signed DNR placed at RN station.  [facility] called at 11:55am for transportation.   Final next level of care: Skilled Nursing Facility Barriers to Discharge: Barriers Resolved   Patient Goals and CMS Choice CMS Medicare.gov Compare Post Acute Care list provided to:: Patient Choice offered to / list presented to : Patient  Discharge Placement PASRR number recieved: 11/06/22 PASRR number recieved: 11/06/22            Patient chooses bed at: [facility] Patient to be transferred to facility by: [facility] Name of family member notified: Patient and son Patient and family notified of of transfer: 11/09/22  Discharge Plan and Services Additional resources added to the After Visit Summary for   In-house Referral: Clinical Social Work              DME Arranged: N/A DME Agency: NA                  Social Determinants of Health (SDOH) Interventions SDOH Screenings   Food Insecurity: No Food Insecurity (11/03/2022)  Housing: Low Risk  (11/03/2022)  Transportation Needs: No Transportation Needs (11/03/2022)  Utilities: Not At Risk (11/03/2022)  Alcohol Screen: Low Risk  (04/14/2022)  Depression (PHQ2-9): Medium Risk (08/02/2022)  Financial Resource Strain: Low Risk  (04/14/2022)  Physical Activity: Inactive (04/14/2022)  Social Connections: Moderately Integrated (04/14/2022)  Stress: No Stress Concern Present (04/14/2022)  Tobacco Use: Low Risk  (11/03/2022)     Readmission Risk Interventions     No data to display

## 2022-11-10 ENCOUNTER — Ambulatory Visit: Payer: Medicare Other | Admitting: Internal Medicine

## 2022-12-05 ENCOUNTER — Ambulatory Visit: Payer: Medicare Other | Admitting: Internal Medicine

## 2022-12-06 ENCOUNTER — Telehealth: Payer: Self-pay | Admitting: Internal Medicine

## 2022-12-06 DIAGNOSIS — M5440 Lumbago with sciatica, unspecified side: Secondary | ICD-10-CM | POA: Diagnosis not present

## 2022-12-06 DIAGNOSIS — I872 Venous insufficiency (chronic) (peripheral): Secondary | ICD-10-CM | POA: Diagnosis not present

## 2022-12-06 DIAGNOSIS — F05 Delirium due to known physiological condition: Secondary | ICD-10-CM | POA: Diagnosis not present

## 2022-12-06 DIAGNOSIS — L03115 Cellulitis of right lower limb: Secondary | ICD-10-CM | POA: Diagnosis not present

## 2022-12-06 DIAGNOSIS — G309 Alzheimer's disease, unspecified: Secondary | ICD-10-CM | POA: Diagnosis not present

## 2022-12-06 DIAGNOSIS — F0284 Dementia in other diseases classified elsewhere, unspecified severity, with anxiety: Secondary | ICD-10-CM | POA: Diagnosis not present

## 2022-12-06 DIAGNOSIS — E876 Hypokalemia: Secondary | ICD-10-CM | POA: Diagnosis not present

## 2022-12-06 DIAGNOSIS — I110 Hypertensive heart disease with heart failure: Secondary | ICD-10-CM | POA: Diagnosis not present

## 2022-12-06 DIAGNOSIS — K760 Fatty (change of) liver, not elsewhere classified: Secondary | ICD-10-CM | POA: Diagnosis not present

## 2022-12-06 DIAGNOSIS — F02818 Dementia in other diseases classified elsewhere, unspecified severity, with other behavioral disturbance: Secondary | ICD-10-CM | POA: Diagnosis not present

## 2022-12-06 DIAGNOSIS — G8929 Other chronic pain: Secondary | ICD-10-CM | POA: Diagnosis not present

## 2022-12-06 DIAGNOSIS — I5042 Chronic combined systolic (congestive) and diastolic (congestive) heart failure: Secondary | ICD-10-CM | POA: Diagnosis not present

## 2022-12-06 DIAGNOSIS — I480 Paroxysmal atrial fibrillation: Secondary | ICD-10-CM | POA: Diagnosis not present

## 2022-12-06 DIAGNOSIS — L03116 Cellulitis of left lower limb: Secondary | ICD-10-CM | POA: Diagnosis not present

## 2022-12-06 DIAGNOSIS — M1990 Unspecified osteoarthritis, unspecified site: Secondary | ICD-10-CM | POA: Diagnosis not present

## 2022-12-06 DIAGNOSIS — E785 Hyperlipidemia, unspecified: Secondary | ICD-10-CM | POA: Diagnosis not present

## 2022-12-06 NOTE — Telephone Encounter (Signed)
HH ORDERS   Caller Name: [facility] Agency Name: [facility] Phone #: [phone](secure)  Service Requested: home health nursing for cellulitis and congestive heart failure (examples: OT/PT/Skilled Nursing/Social Work/Speech Therapy/Wound Care)  Frequency of Visits: 1X a week for 4 weeks   Patient was started on 5 mg donepezil while at their facility and will need a refill sent to her pharmacy.

## 2022-12-10 NOTE — Telephone Encounter (Signed)
Okay for 12 months.  Thank you 

## 2022-12-11 ENCOUNTER — Other Ambulatory Visit: Payer: Self-pay | Admitting: Internal Medicine

## 2022-12-11 ENCOUNTER — Other Ambulatory Visit: Payer: Self-pay

## 2022-12-11 MED ORDER — DONEPEZIL HCL 5 MG PO TABS: ORAL_TABLET | ORAL | 3 refills | Status: DC

## 2022-12-11 NOTE — Telephone Encounter (Signed)
Verbals given and new script updated under Dr. Loren Racer name for refill.

## 2022-12-13 ENCOUNTER — Telehealth: Payer: Self-pay | Admitting: Internal Medicine

## 2022-12-13 NOTE — Telephone Encounter (Signed)
Called son Maurine Minister) he states mom was in rehab and they had took her off the Bumetanide 0.5 mg. But a rx was sent to pharmacy on 12/11/22. Son is wanting to know should he start giving med to mom.Marland KitchenRaechel Chute

## 2022-12-13 NOTE — Telephone Encounter (Signed)
Pt's Home Health facility called to inform us that the pt decline he Nurse visit for today.

## 2022-12-13 NOTE — Telephone Encounter (Signed)
Patient's son Kathleen Mejia called and said she was recently released from rehab. The medication list they were given has removed one of the medications she was already taking. They wanted to know if the patient is supposed to stop taking it or if it was an oversight. Kathleen Mejia would like a call back at [phone].

## 2022-12-15 NOTE — Telephone Encounter (Signed)
Notified pt son Maurine Minister) w/MD response...[name]

## 2022-12-15 NOTE — Telephone Encounter (Signed)
Yes, pls re-start if the legs are swollen Thx

## 2022-12-19 ENCOUNTER — Encounter: Payer: Self-pay | Admitting: Internal Medicine

## 2022-12-19 ENCOUNTER — Ambulatory Visit (INDEPENDENT_AMBULATORY_CARE_PROVIDER_SITE_OTHER): Payer: Medicare Other | Admitting: Internal Medicine

## 2022-12-19 VITALS — BP 120/70 | HR 97 | Temp 98.2°F | Ht 61.0 in | Wt 162.0 lb

## 2022-12-19 DIAGNOSIS — R531 Weakness: Secondary | ICD-10-CM

## 2022-12-19 DIAGNOSIS — L03119 Cellulitis of unspecified part of limb: Secondary | ICD-10-CM | POA: Diagnosis not present

## 2022-12-19 DIAGNOSIS — E538 Deficiency of other specified B group vitamins: Secondary | ICD-10-CM | POA: Diagnosis not present

## 2022-12-19 DIAGNOSIS — G309 Alzheimer's disease, unspecified: Secondary | ICD-10-CM

## 2022-12-19 DIAGNOSIS — I872 Venous insufficiency (chronic) (peripheral): Secondary | ICD-10-CM | POA: Diagnosis not present

## 2022-12-19 DIAGNOSIS — R269 Unspecified abnormalities of gait and mobility: Secondary | ICD-10-CM | POA: Diagnosis not present

## 2022-12-19 DIAGNOSIS — F0280 Dementia in other diseases classified elsewhere without behavioral disturbance: Secondary | ICD-10-CM

## 2022-12-19 LAB — CBC WITH DIFFERENTIAL/PLATELET
Basophils Absolute: 0.1 10*3/uL (ref 0.0–0.1)
Basophils Relative: 0.6 % (ref 0.0–3.0)
Eosinophils Absolute: 0.2 10*3/uL (ref 0.0–0.7)
Eosinophils Relative: 2.8 % (ref 0.0–5.0)
HCT: 41.8 % (ref 36.0–46.0)
Hemoglobin: 13.6 g/dL (ref 12.0–15.0)
Lymphocytes Relative: 22.1 % (ref 12.0–46.0)
Lymphs Abs: 1.7 10*3/uL (ref 0.7–4.0)
MCHC: 32.6 g/dL (ref 30.0–36.0)
MCV: 98.5 fl (ref 78.0–100.0)
Monocytes Absolute: 0.8 10*3/uL (ref 0.1–1.0)
Monocytes Relative: 9.8 % (ref 3.0–12.0)
Neutro Abs: 5 10*3/uL (ref 1.4–7.7)
Neutrophils Relative %: 64.7 % (ref 43.0–77.0)
Platelets: 235 10*3/uL (ref 150.0–400.0)
RBC: 4.24 Mil/uL (ref 3.87–5.11)
RDW: 13.1 % (ref 11.5–15.5)
WBC: 7.8 10*3/uL (ref 4.0–10.5)

## 2022-12-19 LAB — COMPREHENSIVE METABOLIC PANEL
ALT: 13 U/L (ref 0–35)
AST: 14 U/L (ref 0–37)
Albumin: 3.7 g/dL (ref 3.5–5.2)
Alkaline Phosphatase: 81 U/L (ref 39–117)
BUN: 12 mg/dL (ref 6–23)
CO2: 33 mEq/L — ABNORMAL HIGH (ref 19–32)
Calcium: 9.9 mg/dL (ref 8.4–10.5)
Chloride: 103 mEq/L (ref 96–112)
Creatinine, Ser: 0.73 mg/dL (ref 0.40–1.20)
GFR: 73.9 mL/min (ref >60.00)
Glucose, Bld: 99 mg/dL (ref 70–99)
Potassium: 4.7 mEq/L (ref 3.5–5.1)
Sodium: 140 mEq/L (ref 135–145)
Total Bilirubin: 0.5 mg/dL (ref 0.2–1.2)
Total Protein: 6.6 g/dL (ref 6.0–8.3)

## 2022-12-19 MED ORDER — BUMETANIDE 0.5 MG PO TABS: 0.5000 mg | ORAL_TABLET | Freq: Every day | ORAL | 3 refills | Status: DC

## 2022-12-19 MED ORDER — CYANOCOBALAMIN 1000 MCG/ML IJ SOLN
1000.0000 ug | Freq: Once | INTRAMUSCULAR | Status: AC
Start: 2022-12-19 — End: 2022-12-19
  Administered 2022-12-19: 1000 ug via INTRAMUSCULAR

## 2022-12-19 MED ORDER — POTASSIUM CHLORIDE CRYS ER 20 MEQ PO TBCR: 40.0000 meq | EXTENDED_RELEASE_TABLET | Freq: Every day | ORAL | Status: DC

## 2022-12-19 MED ORDER — BUMETANIDE 0.5 MG PO TABS: ORAL_TABLET | ORAL | 3 refills | Status: DC

## 2022-12-19 MED ORDER — SIMVASTATIN 5 MG PO TABS: 5.0000 mg | ORAL_TABLET | Freq: Every day | ORAL | 0 refills | Status: DC

## 2022-12-19 MED ORDER — TORSEMIDE 20 MG PO TABS: 20.0000 mg | ORAL_TABLET | Freq: Every day | ORAL | Status: DC

## 2022-12-19 NOTE — Assessment & Plan Note (Addendum)
Overall better with less edema and with weight loss PT/OT at home

## 2022-12-19 NOTE — Assessment & Plan Note (Addendum)
Finished abx.  Doing well.  RN visits at home

## 2022-12-19 NOTE — Assessment & Plan Note (Signed)
Continue to use a walker at home

## 2022-12-19 NOTE — Assessment & Plan Note (Signed)
Progressing memory loss.  Sundowning symptoms at night.  Poor compliance.  Will continue Aricept 5 mg a day

## 2022-12-19 NOTE — Progress Notes (Signed)
Subjective:  Patient ID: Keylee Shrestha Pleitez, female    DOB: 02-28-1934  Age: 89 y.o. MRN: [id number]  CC: Follow-up (Rehab f/u)   HPI Aquanetta Schwarz Mair presents for LE edema, recent lower extremity cellulitis requiring hospital stay and a rehab stay, dementia.  She is here with her son Maurine Minister and her daughter Sallye Ober.  Outpatient Medications Prior to Visit  Medication Sig Dispense Refill   acetaminophen (TYLENOL) 500 MG tablet Take 500 mg by mouth See admin instructions. Take 500 mg by mouth in the morning and with supper/evening meal     aspirin EC 81 MG tablet Take 81 mg by mouth See admin instructions. Take 81 mg by mouth in the morning and with supper/evening meal     donepezil (ARICEPT) 5 MG tablet TAKE 1 TABLET(5 MG) BY MOUTH AT BEDTIME 90 tablet 3   bumetanide (BUMEX) 0.5 MG tablet Take 0.5 mg by mouth daily. Take one tablet at breakfast     cephALEXin (KEFLEX) 500 MG capsule Take 1 capsule (500 mg total) by mouth every 12 (twelve) hours. 2 capsule 0   potassium chloride SA (KLOR-CON M) 20 MEQ tablet Take 2 tablets (40 mEq total) by mouth daily. 6 tablet 0   simvastatin (ZOCOR) 5 MG tablet Take 1 tablet (5 mg total) by mouth at bedtime. Annual appt is due in w/labs must see provider for future refills 30 tablet 0   torsemide (DEMADEX) 20 MG tablet Take 1 tablet (20 mg total) by mouth daily. 30 tablet    Ascorbic Acid (VITAMIN C) 1000 MG tablet Take 1,000 mg by mouth daily.  (Patient not taking: Reported on 11/03/2022)     Cod Liver Oil 1000 MG CAPS Take 1,000 mg by mouth daily.  (Patient not taking: Reported on 11/03/2022)     docusate sodium (COLACE) 100 MG capsule Take 1 capsule (100 mg total) by mouth 2 (two) times daily. (Patient not taking: Reported on 11/03/2022) 60 capsule 0   ketoconazole (NIZORAL) 2 % cream Apply 1 Application topically 2 (two) times daily. (Patient not taking: Reported on 11/03/2022) 120 g 1   polyethylene glycol (MIRALAX / GLYCOLAX) 17 g packet Take 17 g by mouth  daily as needed for mild constipation. (Patient not taking: Reported on 11/03/2022) 14 each 0   Zinc Sulfate (ZINC 15 PO) Take 50 mg by mouth daily.  (Patient not taking: Reported on 11/03/2022)     No facility-administered medications prior to visit.    ROS: Review of Systems  Constitutional:  Positive for unexpected weight change. Negative for activity change, appetite change, chills and fatigue.  HENT:  Negative for congestion, mouth sores and sinus pressure.   Eyes:  Negative for visual disturbance.  Respiratory:  Negative for cough and chest tightness.   Gastrointestinal:  Negative for abdominal pain and nausea.  Genitourinary:  Negative for difficulty urinating, frequency and vaginal pain.  Musculoskeletal:  Positive for gait problem. Negative for back pain.  Skin:  Positive for rash and wound. Negative for pallor.  Neurological:  Negative for dizziness, tremors, weakness, numbness and headaches.  Hematological:  Bruises/bleeds easily.  Psychiatric/Behavioral:  Positive for confusion and decreased concentration. Negative for behavioral problems and sleep disturbance.     Objective:  BP 120/70 (BP Location: Right Arm, Patient Position: Sitting, Cuff Size: Large)   Pulse 97   Temp 98.2 F (36.8 C) (Oral)   Ht 5\' 1"  (1.549 m)   Wt 162 lb (73.5 kg)   SpO2 97%   BMI  30.61 kg/m   BP Readings from Last 3 Encounters:  12/19/22 120/70  11/09/22 122/85  08/02/22 122/82    Wt Readings from Last 3 Encounters:  12/19/22 162 lb (73.5 kg)  11/09/22 158 lb 4.6 oz (71.8 kg)  08/02/22 173 lb (78.5 kg)    Physical Exam Constitutional:      General: She is not in acute distress.    Appearance: She is well-developed. She is obese.  HENT:     Head: Normocephalic.     Right Ear: External ear normal.     Left Ear: External ear normal.     Nose: Nose normal.  Eyes:     General:        Right eye: No discharge.        Left eye: No discharge.     Conjunctiva/sclera: Conjunctivae  normal.     Pupils: Pupils are equal, round, and reactive to light.  Neck:     Thyroid: No thyromegaly.     Vascular: No JVD.     Trachea: No tracheal deviation.  Cardiovascular:     Rate and Rhythm: Normal rate and regular rhythm.     Heart sounds: Normal heart sounds.  Pulmonary:     Effort: No respiratory distress.     Breath sounds: No stridor. No wheezing.  Abdominal:     General: Bowel sounds are normal. There is no distension.     Palpations: Abdomen is soft. There is no mass.     Tenderness: There is no abdominal tenderness. There is no guarding or rebound.  Musculoskeletal:        General: No tenderness.     Cervical back: Normal range of motion and neck supple. No rigidity.  Lymphadenopathy:     Cervical: No cervical adenopathy.  Skin:    Findings: No erythema or rash.  Neurological:     Mental Status: She is disoriented.     Cranial Nerves: No cranial nerve deficit.     Motor: Weakness present. No abnormal muscle tone.     Coordination: Coordination abnormal.     Gait: Gait abnormal.     Deep Tendon Reflexes: Reflexes abnormal.  Psychiatric:        Behavior: Behavior normal.        Thought Content: Thought content normal.        Judgment: Judgment normal.   Mahrosh is sitting in the wheelchair.  She is comfortable.  Her legs are wrapped.  Dorsal feet with probably 1+ swelling  Lab Results  Component Value Date   WBC 9.5 11/07/2022   HGB 13.8 11/07/2022   HCT 41.6 11/07/2022   PLT 207 11/07/2022   GLUCOSE 99 11/09/2022   CHOL 148 02/17/2021   TRIG 82.0 02/17/2021   HDL 50.80 02/17/2021   LDLDIRECT 139.3 02/21/2008   LDLCALC 81 02/17/2021   ALT 14 11/04/2022   AST 20 11/04/2022   NA 134 (L) 11/09/2022   K 3.6 11/09/2022   CL 98 11/09/2022   CREATININE 0.74 11/09/2022   BUN 15 11/09/2022   CO2 29 11/09/2022   TSH 1.436 11/03/2022   HGBA1C 5.9 (H) 11/03/2022   MICROALBUR 0.4 06/25/2008    ECHOCARDIOGRAM COMPLETE  Result Date: 11/04/2022     ECHOCARDIOGRAM REPORT   Patient Name:   SHERNITA RABINOVICH Date of Exam: 11/04/2022 Medical Rec #:  [id number]         Height:       61.0 in Accession #:    [id number]  Weight:       171.7 lb Date of Birth:  [birth year]         BSA:          1.770 m Patient Age:    89 years          BP:           119/64 mmHg Patient Gender: F                 HR:           82 bpm. Exam Location:  Inpatient Procedure: 2D Echo, 3D Echo, Strain Analysis, Cardiac Doppler and Color Doppler Indications:    I50.21 CHF  History:        Patient has prior history of Echocardiogram examinations, most                 recent 07/18/2020. TIA, Mitral Valve Disease, Arrythmias:PVC;                 Risk Factors:Non-Smoker, Hypertension and Dyslipidemia.  Sonographer:    Dondra Prader RVT RCS Referring Phys: [id number] RAVI PAHWANI IMPRESSIONS  1. Left ventricular ejection fraction, by estimation, is 35 to 40%. The left ventricle has moderately decreased function. The left ventricle demonstrates global hypokinesis. The left ventricular internal cavity size was mildly dilated. Left ventricular diastolic parameters are consistent with Grade I diastolic dysfunction (impaired relaxation). The average left ventricular global longitudinal strain is -5.0 %. The global longitudinal strain is abnormal.  2. Right ventricular systolic function is normal. The right ventricular size is mildly enlarged. There is normal pulmonary artery systolic pressure. The estimated right ventricular systolic pressure is 28.0 mmHg.  3. Left atrial size was mildly dilated.  4. Right atrial size was mildly dilated.  5. The mitral valve is normal in structure. Mild mitral valve regurgitation. No evidence of mitral stenosis.  6. Tricuspid valve regurgitation is mild to moderate.  7. The aortic valve is tricuspid. Aortic valve regurgitation is mild. No aortic stenosis is present.  8. The inferior vena cava is normal in size with greater than 50% respiratory variability, suggesting right  atrial pressure of 3 mmHg. Comparison(s): 07/18/20 EF 45-50%. FINDINGS  Left Ventricle: Left ventricular ejection fraction, by estimation, is 35 to 40%. The left ventricle has moderately decreased function. The left ventricle demonstrates global hypokinesis. The average left ventricular global longitudinal strain is -5.0 %.  The global longitudinal strain is abnormal. The left ventricular internal cavity size was mildly dilated. There is no left ventricular hypertrophy. Left ventricular diastolic parameters are consistent with Grade I diastolic dysfunction (impaired relaxation). Right Ventricle: The right ventricular size is mildly enlarged. No increase in right ventricular wall thickness. Right ventricular systolic function is normal. There is normal pulmonary artery systolic pressure. The tricuspid regurgitant velocity is 2.50  m/s, and with an assumed right atrial pressure of 3 mmHg, the estimated right ventricular systolic pressure is 28.0 mmHg. Left Atrium: Left atrial size was mildly dilated. Right Atrium: Right atrial size was mildly dilated. Pericardium: There is no evidence of pericardial effusion. Mitral Valve: The mitral valve is normal in structure. Mild mitral valve regurgitation. No evidence of mitral valve stenosis. Tricuspid Valve: The tricuspid valve is normal in structure. Tricuspid valve regurgitation is mild to moderate. No evidence of tricuspid stenosis. Aortic Valve: The aortic valve is tricuspid. Aortic valve regurgitation is mild. Aortic regurgitation PHT measures 696 msec. No aortic stenosis is present. Aortic valve mean gradient measures 5.0 mmHg. Aortic valve peak  gradient measures 9.5 mmHg. Aortic  valve area, by VTI measures 1.95 cm. Pulmonic Valve: The pulmonic valve was normal in structure. Pulmonic valve regurgitation is trivial. No evidence of pulmonic stenosis. Aorta: The aortic root is normal in size and structure. Venous: The inferior vena cava is normal in size with greater than  50% respiratory variability, suggesting right atrial pressure of 3 mmHg. IAS/Shunts: No atrial level shunt detected by color flow Doppler.  LEFT VENTRICLE PLAX 2D LVIDd:         5.40 cm LVIDs:         4.30 cm     2D Longitudinal Strain LV PW:         1.30 cm     2D Strain GLS Avg:     -5.0 % LV IVS:        1.10 cm LVOT diam:     2.00 cm LV SV:         51 LV SV Index:   29 LVOT Area:     3.14 cm  LV Volumes (MOD) LV vol d, MOD A2C: 76.8 ml LV vol d, MOD A4C: 92.9 ml LV vol s, MOD A2C: 47.0 ml LV vol s, MOD A4C: 52.2 ml LV SV MOD A2C:     29.8 ml LV SV MOD A4C:     92.9 ml LV SV MOD BP:      36.2 ml RIGHT VENTRICLE            IVC RV Basal diam:  3.95 cm    IVC diam: 1.30 cm RV Mid diam:    3.20 cm RV S prime:     8.56 cm/s TAPSE (M-mode): 1.4 cm LEFT ATRIUM             Index        RIGHT ATRIUM           Index LA diam:        4.00 cm 2.26 cm/m   RA Area:     18.70 cm LA Vol (A2C):   85.2 ml 48.13 ml/m  RA Volume:   58.90 ml  33.27 ml/m LA Vol (A4C):   74.1 ml 41.83 ml/m LA Biplane Vol: 81.7 ml 46.15 ml/m  AORTIC VALVE                     PULMONIC VALVE AV Area (Vmax):    2.04 cm      PV Vmax:       0.75 m/s AV Area (Vmean):   2.05 cm      PV Peak grad:  2.2 mmHg AV Area (VTI):     1.95 cm AV Vmax:           154.00 cm/s AV Vmean:          103.000 cm/s AV VTI:            0.261 m AV Peak Grad:      9.5 mmHg AV Mean Grad:      5.0 mmHg LVOT Vmax:         99.80 cm/s LVOT Vmean:        67.300 cm/s LVOT VTI:          0.162 m LVOT/AV VTI ratio: 0.62 AI PHT:            696 msec AR Vena Contracta: 0.40 cm  AORTA Ao Root diam: 2.90 cm Ao Asc diam:  3.50 cm MITRAL VALVE  TRICUSPID VALVE MV Area (PHT): 3.56 cm    TR Peak grad:   25.0 mmHg MV Decel Time: 213 msec    TR Mean grad:   18.0 mmHg MR Peak grad: 70.2 mmHg    TR Vmax:        250.00 cm/s MR Mean grad: 48.0 mmHg    TR Vmean:       200.0 cm/s MR Vmax:      419.00 cm/s MR Vmean:     327.0 cm/s   SHUNTS MV E velocity: 79.80 cm/s  Systemic VTI:  0.16 m MV  A velocity: 28.10 cm/s  Systemic Diam: 2.00 cm MV E/A ratio:  2.84 Donato Schultz MD Electronically signed by Donato Schultz MD Signature Date/Time: 11/04/2022/11:03:25 AM    Final    VAS [place] LOWER EXTREMITY VENOUS (DVT) (7a-7p)  Result Date: 11/03/2022  Lower Venous DVT Study Patient Name:  RAELLE CHAMBERS  Date of Exam:   11/03/2022 Medical Rec #: [id number]          Accession #:    [id number] Date of Birth: 09/01/1933          Patient Gender: F Patient Age:   62 years Exam Location:  [facility] Procedure:      VAS [place] LOWER EXTREMITY VENOUS (DVT) Referring Phys: JON KNAPP --------------------------------------------------------------------------------  Indications: Swelling.  Risk Factors: None identified. Limitations: Body habitus and poor ultrasound/tissue interface. Comparison Study: No prior studies. Performing Technologist: Chanda Busing RVT  Examination Guidelines: A complete evaluation includes B-mode imaging, spectral Doppler, color Doppler, and power Doppler as needed of all accessible portions of each vessel. Bilateral testing is considered an integral part of a complete examination. Limited examinations for reoccurring indications may be performed as noted. The reflux portion of the exam is performed with the patient in reverse Trendelenburg.  +-----+---------------+---------+-----------+----------+--------------+ RIGHTCompressibilityPhasicitySpontaneityPropertiesThrombus Aging +-----+---------------+---------+-----------+----------+--------------+ CFV  Full           Yes      Yes                                 +-----+---------------+---------+-----------+----------+--------------+   +---------+---------------+---------+-----------+----------+-------------------+ LEFT     CompressibilityPhasicitySpontaneityPropertiesThrombus Aging      +---------+---------------+---------+-----------+----------+-------------------+ CFV      Full           Yes      Yes                                       +---------+---------------+---------+-----------+----------+-------------------+ SFJ      Full                                                             +---------+---------------+---------+-----------+----------+-------------------+ FV Prox  Full                                                             +---------+---------------+---------+-----------+----------+-------------------+ FV Mid   Full                                                             +---------+---------------+---------+-----------+----------+-------------------+  FV Distal               Yes      Yes                                      +---------+---------------+---------+-----------+----------+-------------------+ PFV      Full                                                             +---------+---------------+---------+-----------+----------+-------------------+ POP      Full           Yes      Yes                                      +---------+---------------+---------+-----------+----------+-------------------+ PTV      Full                                                             +---------+---------------+---------+-----------+----------+-------------------+ PERO                                                  Not well visualized +---------+---------------+---------+-----------+----------+-------------------+    Summary: RIGHT: - No evidence of common femoral vein obstruction.  LEFT: - There is no evidence of deep vein thrombosis in the lower extremity. However, portions of this examination were limited- see technologist comments above.  - No cystic structure found in the popliteal fossa.  *See table(s) above for measurements and observations. Electronically signed by Sherald Hess MD on 11/03/2022 at 4:40:29 PM.    Final    DG Chest Portable 1 View  Result Date: 11/03/2022 CLINICAL DATA:  weakness, swelling EXAM: PORTABLE CHEST - 1 VIEW  COMPARISON:  04/06/2014 FINDINGS: Heart is mildly enlarged.  No pulmonary vascular congestion. Lungs are clear. IMPRESSION: Mild cardiomegaly. Electronically Signed   By: Acquanetta Belling M.D.   On: 11/03/2022 15:59   DG Knee 2 Views Left  Result Date: 11/03/2022 CLINICAL DATA:  Increased leg swelling. Stood up at physician's appointment and leg gave out. EXAM: LEFT KNEE - 1-2 VIEW COMPARISON:  None available FINDINGS: No fracture or dislocation. Soft tissues are unremarkable. Moderate tricompartmental osteoarthrosis. IMPRESSION: No acute abnormality of the left knee. Electronically Signed   By: Acquanetta Belling M.D.   On: 11/03/2022 15:58    Assessment & Plan:   Problem List Items Addressed This Visit     Gait disorder    Continue to use a walker at home      B12 deficiency - Primary   Relevant Orders   CBC with Differential/Platelet   Comprehensive metabolic panel   Hemoglobin A1c   TSH   Stasis dermatitis of both legs    Better      Relevant Medications   bumetanide (BUMEX) 0.5 MG tablet   torsemide (  DEMADEX) 20 MG tablet   simvastatin (ZOCOR) 5 MG tablet   Other Relevant Orders   CBC with Differential/Platelet   Comprehensive metabolic panel   Hemoglobin A1c   TSH   Alzheimer disease (HCC)    Progressing memory loss.  Sundowning symptoms at night.  Poor compliance.  Will continue Aricept 5 mg a day      Generalized weakness    Overall better with less edema and with weight loss PT/OT at home      Cellulitis    Finished abx.  Doing well.  RN visits at home      Relevant Orders   CBC with Differential/Platelet   Comprehensive metabolic panel   Hemoglobin A1c   TSH      Meds ordered this encounter  Medications   bumetanide (BUMEX) 0.5 MG tablet    Sig: TAKE 1 TABLET BY MOUTH DAILY WITH BREAKFAST    Dispense:  90 tablet    Refill:  3   DISCONTD: bumetanide (BUMEX) 0.5 MG tablet    Sig: Take 1 tablet (0.5 mg total) by mouth daily. Take one tablet at breakfast     Dispense:  90 tablet    Refill:  3   potassium chloride SA (KLOR-CON M) 20 MEQ tablet    Sig: Take 2 tablets (40 mEq total) by mouth daily.   torsemide (DEMADEX) 20 MG tablet    Sig: Take 1 tablet (20 mg total) by mouth daily.   simvastatin (ZOCOR) 5 MG tablet    Sig: Take 1 tablet (5 mg total) by mouth at bedtime. Annual appt is due in w/labs must see provider for future refills    Dispense:  30 tablet    Refill:  0   cyanocobalamin (VITAMIN B12) injection 1,000 mcg      Follow-up: Return in about 2 months (around 02/19/2023) for a follow-up visit.  Sonda Primes, MD

## 2022-12-19 NOTE — Assessment & Plan Note (Signed)
 Better  

## 2022-12-20 DIAGNOSIS — L03116 Cellulitis of left lower limb: Secondary | ICD-10-CM | POA: Diagnosis not present

## 2022-12-20 DIAGNOSIS — E785 Hyperlipidemia, unspecified: Secondary | ICD-10-CM | POA: Diagnosis not present

## 2022-12-20 DIAGNOSIS — G8929 Other chronic pain: Secondary | ICD-10-CM | POA: Diagnosis not present

## 2022-12-20 DIAGNOSIS — K760 Fatty (change of) liver, not elsewhere classified: Secondary | ICD-10-CM | POA: Diagnosis not present

## 2022-12-20 DIAGNOSIS — F02818 Dementia in other diseases classified elsewhere, unspecified severity, with other behavioral disturbance: Secondary | ICD-10-CM | POA: Diagnosis not present

## 2022-12-20 DIAGNOSIS — F05 Delirium due to known physiological condition: Secondary | ICD-10-CM | POA: Diagnosis not present

## 2022-12-20 DIAGNOSIS — I872 Venous insufficiency (chronic) (peripheral): Secondary | ICD-10-CM | POA: Diagnosis not present

## 2022-12-20 DIAGNOSIS — M5440 Lumbago with sciatica, unspecified side: Secondary | ICD-10-CM | POA: Diagnosis not present

## 2022-12-20 DIAGNOSIS — I5042 Chronic combined systolic (congestive) and diastolic (congestive) heart failure: Secondary | ICD-10-CM | POA: Diagnosis not present

## 2022-12-20 DIAGNOSIS — I110 Hypertensive heart disease with heart failure: Secondary | ICD-10-CM | POA: Diagnosis not present

## 2022-12-20 DIAGNOSIS — I480 Paroxysmal atrial fibrillation: Secondary | ICD-10-CM | POA: Diagnosis not present

## 2022-12-20 DIAGNOSIS — E876 Hypokalemia: Secondary | ICD-10-CM | POA: Diagnosis not present

## 2022-12-20 DIAGNOSIS — G309 Alzheimer's disease, unspecified: Secondary | ICD-10-CM | POA: Diagnosis not present

## 2022-12-20 DIAGNOSIS — F0284 Dementia in other diseases classified elsewhere, unspecified severity, with anxiety: Secondary | ICD-10-CM | POA: Diagnosis not present

## 2022-12-20 DIAGNOSIS — M1990 Unspecified osteoarthritis, unspecified site: Secondary | ICD-10-CM | POA: Diagnosis not present

## 2022-12-20 DIAGNOSIS — L03115 Cellulitis of right lower limb: Secondary | ICD-10-CM | POA: Diagnosis not present

## 2022-12-20 LAB — TSH: TSH: 1.38 u[IU]/mL (ref 0.35–5.50)

## 2022-12-20 LAB — HEMOGLOBIN A1C: Hgb A1c MFr Bld: 5.7 % (ref 4.6–6.5)

## 2022-12-21 DIAGNOSIS — I5042 Chronic combined systolic (congestive) and diastolic (congestive) heart failure: Secondary | ICD-10-CM | POA: Diagnosis not present

## 2022-12-21 DIAGNOSIS — E785 Hyperlipidemia, unspecified: Secondary | ICD-10-CM | POA: Diagnosis not present

## 2022-12-21 DIAGNOSIS — I110 Hypertensive heart disease with heart failure: Secondary | ICD-10-CM | POA: Diagnosis not present

## 2022-12-21 DIAGNOSIS — E876 Hypokalemia: Secondary | ICD-10-CM | POA: Diagnosis not present

## 2022-12-21 DIAGNOSIS — L03116 Cellulitis of left lower limb: Secondary | ICD-10-CM | POA: Diagnosis not present

## 2022-12-21 DIAGNOSIS — I872 Venous insufficiency (chronic) (peripheral): Secondary | ICD-10-CM | POA: Diagnosis not present

## 2022-12-21 DIAGNOSIS — F0284 Dementia in other diseases classified elsewhere, unspecified severity, with anxiety: Secondary | ICD-10-CM | POA: Diagnosis not present

## 2022-12-21 DIAGNOSIS — L03115 Cellulitis of right lower limb: Secondary | ICD-10-CM | POA: Diagnosis not present

## 2022-12-21 DIAGNOSIS — G309 Alzheimer's disease, unspecified: Secondary | ICD-10-CM | POA: Diagnosis not present

## 2022-12-21 DIAGNOSIS — F05 Delirium due to known physiological condition: Secondary | ICD-10-CM | POA: Diagnosis not present

## 2022-12-21 DIAGNOSIS — F02818 Dementia in other diseases classified elsewhere, unspecified severity, with other behavioral disturbance: Secondary | ICD-10-CM | POA: Diagnosis not present

## 2022-12-21 DIAGNOSIS — M5440 Lumbago with sciatica, unspecified side: Secondary | ICD-10-CM | POA: Diagnosis not present

## 2022-12-21 DIAGNOSIS — G8929 Other chronic pain: Secondary | ICD-10-CM | POA: Diagnosis not present

## 2022-12-21 DIAGNOSIS — K760 Fatty (change of) liver, not elsewhere classified: Secondary | ICD-10-CM | POA: Diagnosis not present

## 2022-12-21 DIAGNOSIS — M1990 Unspecified osteoarthritis, unspecified site: Secondary | ICD-10-CM | POA: Diagnosis not present

## 2022-12-21 DIAGNOSIS — I480 Paroxysmal atrial fibrillation: Secondary | ICD-10-CM | POA: Diagnosis not present

## 2022-12-22 DIAGNOSIS — I480 Paroxysmal atrial fibrillation: Secondary | ICD-10-CM | POA: Diagnosis not present

## 2022-12-22 DIAGNOSIS — G8929 Other chronic pain: Secondary | ICD-10-CM | POA: Diagnosis not present

## 2022-12-22 DIAGNOSIS — E785 Hyperlipidemia, unspecified: Secondary | ICD-10-CM | POA: Diagnosis not present

## 2022-12-22 DIAGNOSIS — M5440 Lumbago with sciatica, unspecified side: Secondary | ICD-10-CM | POA: Diagnosis not present

## 2022-12-22 DIAGNOSIS — F0284 Dementia in other diseases classified elsewhere, unspecified severity, with anxiety: Secondary | ICD-10-CM | POA: Diagnosis not present

## 2022-12-22 DIAGNOSIS — I110 Hypertensive heart disease with heart failure: Secondary | ICD-10-CM | POA: Diagnosis not present

## 2022-12-22 DIAGNOSIS — F05 Delirium due to known physiological condition: Secondary | ICD-10-CM | POA: Diagnosis not present

## 2022-12-22 DIAGNOSIS — F02818 Dementia in other diseases classified elsewhere, unspecified severity, with other behavioral disturbance: Secondary | ICD-10-CM | POA: Diagnosis not present

## 2022-12-22 DIAGNOSIS — K760 Fatty (change of) liver, not elsewhere classified: Secondary | ICD-10-CM | POA: Diagnosis not present

## 2022-12-22 DIAGNOSIS — L03115 Cellulitis of right lower limb: Secondary | ICD-10-CM | POA: Diagnosis not present

## 2022-12-22 DIAGNOSIS — L03116 Cellulitis of left lower limb: Secondary | ICD-10-CM | POA: Diagnosis not present

## 2022-12-22 DIAGNOSIS — I5042 Chronic combined systolic (congestive) and diastolic (congestive) heart failure: Secondary | ICD-10-CM | POA: Diagnosis not present

## 2022-12-22 DIAGNOSIS — G309 Alzheimer's disease, unspecified: Secondary | ICD-10-CM | POA: Diagnosis not present

## 2022-12-22 DIAGNOSIS — M1990 Unspecified osteoarthritis, unspecified site: Secondary | ICD-10-CM | POA: Diagnosis not present

## 2022-12-22 DIAGNOSIS — I872 Venous insufficiency (chronic) (peripheral): Secondary | ICD-10-CM | POA: Diagnosis not present

## 2022-12-22 DIAGNOSIS — E876 Hypokalemia: Secondary | ICD-10-CM | POA: Diagnosis not present

## 2022-12-22 NOTE — Progress Notes (Signed)
Notified son w/MD response on labs.Marland KitchenRaechel Chute

## 2022-12-27 ENCOUNTER — Telehealth: Payer: Self-pay | Admitting: Internal Medicine

## 2022-12-27 NOTE — Telephone Encounter (Signed)
Called Kathleen Mejia no answer left verbal on VM. Pt is compliance w/ appt.Marland KitchenRaechel Chute

## 2022-12-27 NOTE — Telephone Encounter (Signed)
HH ORDERS   Caller Name: [facility] Agency Name: [facility] Phone #: [phone](secure)  Service Requested: PT (examples: OT/PT/Skilled Nursing/Social Work/Speech Therapy/Wound Care)  Frequency of Visits: 1 week 1, 2 week 3, 1 week 2   Kathleen Mejia also said an order was previously faxed, but they have not heard back. This is the same request.

## 2022-12-28 NOTE — Telephone Encounter (Signed)
Okay.  Thanks.

## 2022-12-29 ENCOUNTER — Telehealth: Payer: Self-pay | Admitting: Internal Medicine

## 2022-12-29 DIAGNOSIS — I480 Paroxysmal atrial fibrillation: Secondary | ICD-10-CM | POA: Diagnosis not present

## 2022-12-29 DIAGNOSIS — I5042 Chronic combined systolic (congestive) and diastolic (congestive) heart failure: Secondary | ICD-10-CM | POA: Diagnosis not present

## 2022-12-29 DIAGNOSIS — I110 Hypertensive heart disease with heart failure: Secondary | ICD-10-CM | POA: Diagnosis not present

## 2022-12-29 DIAGNOSIS — F0284 Dementia in other diseases classified elsewhere, unspecified severity, with anxiety: Secondary | ICD-10-CM | POA: Diagnosis not present

## 2022-12-29 DIAGNOSIS — F02818 Dementia in other diseases classified elsewhere, unspecified severity, with other behavioral disturbance: Secondary | ICD-10-CM | POA: Diagnosis not present

## 2022-12-29 DIAGNOSIS — L03116 Cellulitis of left lower limb: Secondary | ICD-10-CM | POA: Diagnosis not present

## 2022-12-29 DIAGNOSIS — I872 Venous insufficiency (chronic) (peripheral): Secondary | ICD-10-CM | POA: Diagnosis not present

## 2022-12-29 DIAGNOSIS — M5440 Lumbago with sciatica, unspecified side: Secondary | ICD-10-CM | POA: Diagnosis not present

## 2022-12-29 DIAGNOSIS — G8929 Other chronic pain: Secondary | ICD-10-CM | POA: Diagnosis not present

## 2022-12-29 DIAGNOSIS — E876 Hypokalemia: Secondary | ICD-10-CM | POA: Diagnosis not present

## 2022-12-29 DIAGNOSIS — M1990 Unspecified osteoarthritis, unspecified site: Secondary | ICD-10-CM | POA: Diagnosis not present

## 2022-12-29 DIAGNOSIS — G309 Alzheimer's disease, unspecified: Secondary | ICD-10-CM | POA: Diagnosis not present

## 2022-12-29 DIAGNOSIS — L03115 Cellulitis of right lower limb: Secondary | ICD-10-CM | POA: Diagnosis not present

## 2022-12-29 DIAGNOSIS — E785 Hyperlipidemia, unspecified: Secondary | ICD-10-CM | POA: Diagnosis not present

## 2022-12-29 DIAGNOSIS — K760 Fatty (change of) liver, not elsewhere classified: Secondary | ICD-10-CM | POA: Diagnosis not present

## 2022-12-29 DIAGNOSIS — F05 Delirium due to known physiological condition: Secondary | ICD-10-CM | POA: Diagnosis not present

## 2022-12-29 NOTE — Telephone Encounter (Signed)
Yolanda from [facility] states the pt is discontinuing skilled nursing but will continue seeing [facility] for physical therapy.   Patsy Lager915 [phone]

## 2023-01-01 NOTE — Telephone Encounter (Signed)
Okay.  Thanks.

## 2023-01-03 DIAGNOSIS — G309 Alzheimer's disease, unspecified: Secondary | ICD-10-CM | POA: Diagnosis not present

## 2023-01-03 DIAGNOSIS — F05 Delirium due to known physiological condition: Secondary | ICD-10-CM | POA: Diagnosis not present

## 2023-01-03 DIAGNOSIS — L03116 Cellulitis of left lower limb: Secondary | ICD-10-CM | POA: Diagnosis not present

## 2023-01-03 DIAGNOSIS — I110 Hypertensive heart disease with heart failure: Secondary | ICD-10-CM | POA: Diagnosis not present

## 2023-01-03 DIAGNOSIS — E785 Hyperlipidemia, unspecified: Secondary | ICD-10-CM | POA: Diagnosis not present

## 2023-01-03 DIAGNOSIS — M5440 Lumbago with sciatica, unspecified side: Secondary | ICD-10-CM | POA: Diagnosis not present

## 2023-01-03 DIAGNOSIS — M1990 Unspecified osteoarthritis, unspecified site: Secondary | ICD-10-CM | POA: Diagnosis not present

## 2023-01-03 DIAGNOSIS — I480 Paroxysmal atrial fibrillation: Secondary | ICD-10-CM | POA: Diagnosis not present

## 2023-01-03 DIAGNOSIS — L03115 Cellulitis of right lower limb: Secondary | ICD-10-CM | POA: Diagnosis not present

## 2023-01-03 DIAGNOSIS — I872 Venous insufficiency (chronic) (peripheral): Secondary | ICD-10-CM | POA: Diagnosis not present

## 2023-01-03 DIAGNOSIS — E876 Hypokalemia: Secondary | ICD-10-CM | POA: Diagnosis not present

## 2023-01-03 DIAGNOSIS — G8929 Other chronic pain: Secondary | ICD-10-CM | POA: Diagnosis not present

## 2023-01-03 DIAGNOSIS — F0284 Dementia in other diseases classified elsewhere, unspecified severity, with anxiety: Secondary | ICD-10-CM | POA: Diagnosis not present

## 2023-01-03 DIAGNOSIS — K760 Fatty (change of) liver, not elsewhere classified: Secondary | ICD-10-CM | POA: Diagnosis not present

## 2023-01-03 DIAGNOSIS — F02818 Dementia in other diseases classified elsewhere, unspecified severity, with other behavioral disturbance: Secondary | ICD-10-CM | POA: Diagnosis not present

## 2023-01-03 DIAGNOSIS — I5042 Chronic combined systolic (congestive) and diastolic (congestive) heart failure: Secondary | ICD-10-CM | POA: Diagnosis not present

## 2023-01-05 DIAGNOSIS — F0284 Dementia in other diseases classified elsewhere, unspecified severity, with anxiety: Secondary | ICD-10-CM | POA: Diagnosis not present

## 2023-01-05 DIAGNOSIS — F02818 Dementia in other diseases classified elsewhere, unspecified severity, with other behavioral disturbance: Secondary | ICD-10-CM | POA: Diagnosis not present

## 2023-01-05 DIAGNOSIS — I110 Hypertensive heart disease with heart failure: Secondary | ICD-10-CM | POA: Diagnosis not present

## 2023-01-05 DIAGNOSIS — E876 Hypokalemia: Secondary | ICD-10-CM | POA: Diagnosis not present

## 2023-01-05 DIAGNOSIS — I5042 Chronic combined systolic (congestive) and diastolic (congestive) heart failure: Secondary | ICD-10-CM | POA: Diagnosis not present

## 2023-01-05 DIAGNOSIS — I480 Paroxysmal atrial fibrillation: Secondary | ICD-10-CM | POA: Diagnosis not present

## 2023-01-05 DIAGNOSIS — K760 Fatty (change of) liver, not elsewhere classified: Secondary | ICD-10-CM | POA: Diagnosis not present

## 2023-01-05 DIAGNOSIS — F05 Delirium due to known physiological condition: Secondary | ICD-10-CM | POA: Diagnosis not present

## 2023-01-05 DIAGNOSIS — M1990 Unspecified osteoarthritis, unspecified site: Secondary | ICD-10-CM | POA: Diagnosis not present

## 2023-01-05 DIAGNOSIS — L03115 Cellulitis of right lower limb: Secondary | ICD-10-CM | POA: Diagnosis not present

## 2023-01-05 DIAGNOSIS — G8929 Other chronic pain: Secondary | ICD-10-CM | POA: Diagnosis not present

## 2023-01-05 DIAGNOSIS — M5440 Lumbago with sciatica, unspecified side: Secondary | ICD-10-CM | POA: Diagnosis not present

## 2023-01-05 DIAGNOSIS — E785 Hyperlipidemia, unspecified: Secondary | ICD-10-CM | POA: Diagnosis not present

## 2023-01-05 DIAGNOSIS — G309 Alzheimer's disease, unspecified: Secondary | ICD-10-CM | POA: Diagnosis not present

## 2023-01-05 DIAGNOSIS — I872 Venous insufficiency (chronic) (peripheral): Secondary | ICD-10-CM | POA: Diagnosis not present

## 2023-01-05 DIAGNOSIS — L03116 Cellulitis of left lower limb: Secondary | ICD-10-CM | POA: Diagnosis not present

## 2023-01-08 DIAGNOSIS — K760 Fatty (change of) liver, not elsewhere classified: Secondary | ICD-10-CM | POA: Diagnosis not present

## 2023-01-08 DIAGNOSIS — L03115 Cellulitis of right lower limb: Secondary | ICD-10-CM | POA: Diagnosis not present

## 2023-01-08 DIAGNOSIS — F0284 Dementia in other diseases classified elsewhere, unspecified severity, with anxiety: Secondary | ICD-10-CM | POA: Diagnosis not present

## 2023-01-08 DIAGNOSIS — F05 Delirium due to known physiological condition: Secondary | ICD-10-CM | POA: Diagnosis not present

## 2023-01-08 DIAGNOSIS — G309 Alzheimer's disease, unspecified: Secondary | ICD-10-CM | POA: Diagnosis not present

## 2023-01-08 DIAGNOSIS — G8929 Other chronic pain: Secondary | ICD-10-CM | POA: Diagnosis not present

## 2023-01-08 DIAGNOSIS — M1990 Unspecified osteoarthritis, unspecified site: Secondary | ICD-10-CM | POA: Diagnosis not present

## 2023-01-08 DIAGNOSIS — I480 Paroxysmal atrial fibrillation: Secondary | ICD-10-CM | POA: Diagnosis not present

## 2023-01-08 DIAGNOSIS — E785 Hyperlipidemia, unspecified: Secondary | ICD-10-CM | POA: Diagnosis not present

## 2023-01-08 DIAGNOSIS — F02818 Dementia in other diseases classified elsewhere, unspecified severity, with other behavioral disturbance: Secondary | ICD-10-CM | POA: Diagnosis not present

## 2023-01-08 DIAGNOSIS — I872 Venous insufficiency (chronic) (peripheral): Secondary | ICD-10-CM | POA: Diagnosis not present

## 2023-01-08 DIAGNOSIS — I110 Hypertensive heart disease with heart failure: Secondary | ICD-10-CM | POA: Diagnosis not present

## 2023-01-08 DIAGNOSIS — I5042 Chronic combined systolic (congestive) and diastolic (congestive) heart failure: Secondary | ICD-10-CM | POA: Diagnosis not present

## 2023-01-08 DIAGNOSIS — E876 Hypokalemia: Secondary | ICD-10-CM | POA: Diagnosis not present

## 2023-01-08 DIAGNOSIS — M5440 Lumbago with sciatica, unspecified side: Secondary | ICD-10-CM | POA: Diagnosis not present

## 2023-01-08 DIAGNOSIS — L03116 Cellulitis of left lower limb: Secondary | ICD-10-CM | POA: Diagnosis not present

## 2023-01-10 ENCOUNTER — Telehealth: Payer: Self-pay | Admitting: Internal Medicine

## 2023-01-10 DIAGNOSIS — M1990 Unspecified osteoarthritis, unspecified site: Secondary | ICD-10-CM | POA: Diagnosis not present

## 2023-01-10 DIAGNOSIS — G309 Alzheimer's disease, unspecified: Secondary | ICD-10-CM | POA: Diagnosis not present

## 2023-01-10 DIAGNOSIS — L03116 Cellulitis of left lower limb: Secondary | ICD-10-CM | POA: Diagnosis not present

## 2023-01-10 DIAGNOSIS — E876 Hypokalemia: Secondary | ICD-10-CM | POA: Diagnosis not present

## 2023-01-10 DIAGNOSIS — M5440 Lumbago with sciatica, unspecified side: Secondary | ICD-10-CM | POA: Diagnosis not present

## 2023-01-10 DIAGNOSIS — I110 Hypertensive heart disease with heart failure: Secondary | ICD-10-CM | POA: Diagnosis not present

## 2023-01-10 DIAGNOSIS — K760 Fatty (change of) liver, not elsewhere classified: Secondary | ICD-10-CM | POA: Diagnosis not present

## 2023-01-10 DIAGNOSIS — G8929 Other chronic pain: Secondary | ICD-10-CM | POA: Diagnosis not present

## 2023-01-10 DIAGNOSIS — I872 Venous insufficiency (chronic) (peripheral): Secondary | ICD-10-CM | POA: Diagnosis not present

## 2023-01-10 DIAGNOSIS — F02818 Dementia in other diseases classified elsewhere, unspecified severity, with other behavioral disturbance: Secondary | ICD-10-CM | POA: Diagnosis not present

## 2023-01-10 DIAGNOSIS — L03115 Cellulitis of right lower limb: Secondary | ICD-10-CM | POA: Diagnosis not present

## 2023-01-10 DIAGNOSIS — I480 Paroxysmal atrial fibrillation: Secondary | ICD-10-CM | POA: Diagnosis not present

## 2023-01-10 DIAGNOSIS — I5042 Chronic combined systolic (congestive) and diastolic (congestive) heart failure: Secondary | ICD-10-CM | POA: Diagnosis not present

## 2023-01-10 DIAGNOSIS — E785 Hyperlipidemia, unspecified: Secondary | ICD-10-CM | POA: Diagnosis not present

## 2023-01-10 DIAGNOSIS — F05 Delirium due to known physiological condition: Secondary | ICD-10-CM | POA: Diagnosis not present

## 2023-01-10 DIAGNOSIS — F0284 Dementia in other diseases classified elsewhere, unspecified severity, with anxiety: Secondary | ICD-10-CM | POA: Diagnosis not present

## 2023-01-10 NOTE — Telephone Encounter (Signed)
Marchelle Folks with [facility] [phone]  Called just to report that patient had a fall at her home last night.  No injuries reported.

## 2023-01-11 DIAGNOSIS — E669 Obesity, unspecified: Secondary | ICD-10-CM

## 2023-01-11 DIAGNOSIS — M1990 Unspecified osteoarthritis, unspecified site: Secondary | ICD-10-CM

## 2023-01-11 DIAGNOSIS — I480 Paroxysmal atrial fibrillation: Secondary | ICD-10-CM

## 2023-01-11 DIAGNOSIS — I872 Venous insufficiency (chronic) (peripheral): Secondary | ICD-10-CM

## 2023-01-11 DIAGNOSIS — M5440 Lumbago with sciatica, unspecified side: Secondary | ICD-10-CM

## 2023-01-11 DIAGNOSIS — G309 Alzheimer's disease, unspecified: Secondary | ICD-10-CM

## 2023-01-11 DIAGNOSIS — G8929 Other chronic pain: Secondary | ICD-10-CM

## 2023-01-11 DIAGNOSIS — Z7982 Long term (current) use of aspirin: Secondary | ICD-10-CM

## 2023-01-11 DIAGNOSIS — E876 Hypokalemia: Secondary | ICD-10-CM

## 2023-01-11 DIAGNOSIS — L03116 Cellulitis of left lower limb: Secondary | ICD-10-CM | POA: Diagnosis not present

## 2023-01-11 DIAGNOSIS — F02818 Dementia in other diseases classified elsewhere, unspecified severity, with other behavioral disturbance: Secondary | ICD-10-CM

## 2023-01-11 DIAGNOSIS — F05 Delirium due to known physiological condition: Secondary | ICD-10-CM

## 2023-01-11 DIAGNOSIS — E538 Deficiency of other specified B group vitamins: Secondary | ICD-10-CM

## 2023-01-11 DIAGNOSIS — F0284 Dementia in other diseases classified elsewhere, unspecified severity, with anxiety: Secondary | ICD-10-CM

## 2023-01-11 DIAGNOSIS — Z6841 Body Mass Index (BMI) 40.0 and over, adult: Secondary | ICD-10-CM

## 2023-01-11 DIAGNOSIS — E785 Hyperlipidemia, unspecified: Secondary | ICD-10-CM

## 2023-01-11 DIAGNOSIS — I5042 Chronic combined systolic (congestive) and diastolic (congestive) heart failure: Secondary | ICD-10-CM | POA: Diagnosis not present

## 2023-01-11 DIAGNOSIS — I110 Hypertensive heart disease with heart failure: Secondary | ICD-10-CM | POA: Diagnosis not present

## 2023-01-11 DIAGNOSIS — L03115 Cellulitis of right lower limb: Secondary | ICD-10-CM | POA: Diagnosis not present

## 2023-01-11 DIAGNOSIS — K760 Fatty (change of) liver, not elsewhere classified: Secondary | ICD-10-CM

## 2023-01-11 DIAGNOSIS — R32 Unspecified urinary incontinence: Secondary | ICD-10-CM

## 2023-01-15 DIAGNOSIS — I872 Venous insufficiency (chronic) (peripheral): Secondary | ICD-10-CM | POA: Diagnosis not present

## 2023-01-15 DIAGNOSIS — M5440 Lumbago with sciatica, unspecified side: Secondary | ICD-10-CM | POA: Diagnosis not present

## 2023-01-15 DIAGNOSIS — F0284 Dementia in other diseases classified elsewhere, unspecified severity, with anxiety: Secondary | ICD-10-CM | POA: Diagnosis not present

## 2023-01-15 DIAGNOSIS — G309 Alzheimer's disease, unspecified: Secondary | ICD-10-CM | POA: Diagnosis not present

## 2023-01-15 DIAGNOSIS — I480 Paroxysmal atrial fibrillation: Secondary | ICD-10-CM | POA: Diagnosis not present

## 2023-01-15 DIAGNOSIS — I5042 Chronic combined systolic (congestive) and diastolic (congestive) heart failure: Secondary | ICD-10-CM | POA: Diagnosis not present

## 2023-01-15 DIAGNOSIS — I110 Hypertensive heart disease with heart failure: Secondary | ICD-10-CM | POA: Diagnosis not present

## 2023-01-15 DIAGNOSIS — E876 Hypokalemia: Secondary | ICD-10-CM | POA: Diagnosis not present

## 2023-01-15 DIAGNOSIS — L03115 Cellulitis of right lower limb: Secondary | ICD-10-CM | POA: Diagnosis not present

## 2023-01-15 DIAGNOSIS — E785 Hyperlipidemia, unspecified: Secondary | ICD-10-CM | POA: Diagnosis not present

## 2023-01-15 DIAGNOSIS — G8929 Other chronic pain: Secondary | ICD-10-CM | POA: Diagnosis not present

## 2023-01-15 DIAGNOSIS — K760 Fatty (change of) liver, not elsewhere classified: Secondary | ICD-10-CM | POA: Diagnosis not present

## 2023-01-15 DIAGNOSIS — M1990 Unspecified osteoarthritis, unspecified site: Secondary | ICD-10-CM | POA: Diagnosis not present

## 2023-01-15 DIAGNOSIS — L03116 Cellulitis of left lower limb: Secondary | ICD-10-CM | POA: Diagnosis not present

## 2023-01-15 DIAGNOSIS — F02818 Dementia in other diseases classified elsewhere, unspecified severity, with other behavioral disturbance: Secondary | ICD-10-CM | POA: Diagnosis not present

## 2023-01-15 DIAGNOSIS — F05 Delirium due to known physiological condition: Secondary | ICD-10-CM | POA: Diagnosis not present

## 2023-01-20 DIAGNOSIS — F02818 Dementia in other diseases classified elsewhere, unspecified severity, with other behavioral disturbance: Secondary | ICD-10-CM | POA: Diagnosis not present

## 2023-01-20 DIAGNOSIS — I110 Hypertensive heart disease with heart failure: Secondary | ICD-10-CM | POA: Diagnosis not present

## 2023-01-20 DIAGNOSIS — I872 Venous insufficiency (chronic) (peripheral): Secondary | ICD-10-CM | POA: Diagnosis not present

## 2023-01-20 DIAGNOSIS — G309 Alzheimer's disease, unspecified: Secondary | ICD-10-CM | POA: Diagnosis not present

## 2023-01-20 DIAGNOSIS — F0284 Dementia in other diseases classified elsewhere, unspecified severity, with anxiety: Secondary | ICD-10-CM | POA: Diagnosis not present

## 2023-01-20 DIAGNOSIS — F05 Delirium due to known physiological condition: Secondary | ICD-10-CM | POA: Diagnosis not present

## 2023-01-20 DIAGNOSIS — L03115 Cellulitis of right lower limb: Secondary | ICD-10-CM | POA: Diagnosis not present

## 2023-01-20 DIAGNOSIS — L03116 Cellulitis of left lower limb: Secondary | ICD-10-CM | POA: Diagnosis not present

## 2023-01-20 DIAGNOSIS — I480 Paroxysmal atrial fibrillation: Secondary | ICD-10-CM | POA: Diagnosis not present

## 2023-01-20 DIAGNOSIS — G8929 Other chronic pain: Secondary | ICD-10-CM | POA: Diagnosis not present

## 2023-01-20 DIAGNOSIS — M1990 Unspecified osteoarthritis, unspecified site: Secondary | ICD-10-CM | POA: Diagnosis not present

## 2023-01-20 DIAGNOSIS — E876 Hypokalemia: Secondary | ICD-10-CM | POA: Diagnosis not present

## 2023-01-20 DIAGNOSIS — E785 Hyperlipidemia, unspecified: Secondary | ICD-10-CM | POA: Diagnosis not present

## 2023-01-20 DIAGNOSIS — M5440 Lumbago with sciatica, unspecified side: Secondary | ICD-10-CM | POA: Diagnosis not present

## 2023-01-20 DIAGNOSIS — I5042 Chronic combined systolic (congestive) and diastolic (congestive) heart failure: Secondary | ICD-10-CM | POA: Diagnosis not present

## 2023-01-20 DIAGNOSIS — K760 Fatty (change of) liver, not elsewhere classified: Secondary | ICD-10-CM | POA: Diagnosis not present

## 2023-01-25 DIAGNOSIS — G309 Alzheimer's disease, unspecified: Secondary | ICD-10-CM | POA: Diagnosis not present

## 2023-01-25 DIAGNOSIS — I110 Hypertensive heart disease with heart failure: Secondary | ICD-10-CM | POA: Diagnosis not present

## 2023-01-25 DIAGNOSIS — E785 Hyperlipidemia, unspecified: Secondary | ICD-10-CM | POA: Diagnosis not present

## 2023-01-25 DIAGNOSIS — F02818 Dementia in other diseases classified elsewhere, unspecified severity, with other behavioral disturbance: Secondary | ICD-10-CM | POA: Diagnosis not present

## 2023-01-25 DIAGNOSIS — G8929 Other chronic pain: Secondary | ICD-10-CM | POA: Diagnosis not present

## 2023-01-25 DIAGNOSIS — L03116 Cellulitis of left lower limb: Secondary | ICD-10-CM | POA: Diagnosis not present

## 2023-01-25 DIAGNOSIS — E876 Hypokalemia: Secondary | ICD-10-CM | POA: Diagnosis not present

## 2023-01-25 DIAGNOSIS — K760 Fatty (change of) liver, not elsewhere classified: Secondary | ICD-10-CM | POA: Diagnosis not present

## 2023-01-25 DIAGNOSIS — L03115 Cellulitis of right lower limb: Secondary | ICD-10-CM | POA: Diagnosis not present

## 2023-01-25 DIAGNOSIS — I480 Paroxysmal atrial fibrillation: Secondary | ICD-10-CM | POA: Diagnosis not present

## 2023-01-25 DIAGNOSIS — F05 Delirium due to known physiological condition: Secondary | ICD-10-CM | POA: Diagnosis not present

## 2023-01-25 DIAGNOSIS — M1990 Unspecified osteoarthritis, unspecified site: Secondary | ICD-10-CM | POA: Diagnosis not present

## 2023-01-25 DIAGNOSIS — I872 Venous insufficiency (chronic) (peripheral): Secondary | ICD-10-CM | POA: Diagnosis not present

## 2023-01-25 DIAGNOSIS — M5440 Lumbago with sciatica, unspecified side: Secondary | ICD-10-CM | POA: Diagnosis not present

## 2023-01-25 DIAGNOSIS — I5042 Chronic combined systolic (congestive) and diastolic (congestive) heart failure: Secondary | ICD-10-CM | POA: Diagnosis not present

## 2023-01-25 DIAGNOSIS — F0284 Dementia in other diseases classified elsewhere, unspecified severity, with anxiety: Secondary | ICD-10-CM | POA: Diagnosis not present

## 2023-02-02 DIAGNOSIS — L03116 Cellulitis of left lower limb: Secondary | ICD-10-CM | POA: Diagnosis not present

## 2023-02-02 DIAGNOSIS — F0284 Dementia in other diseases classified elsewhere, unspecified severity, with anxiety: Secondary | ICD-10-CM | POA: Diagnosis not present

## 2023-02-02 DIAGNOSIS — G309 Alzheimer's disease, unspecified: Secondary | ICD-10-CM | POA: Diagnosis not present

## 2023-02-02 DIAGNOSIS — E785 Hyperlipidemia, unspecified: Secondary | ICD-10-CM | POA: Diagnosis not present

## 2023-02-02 DIAGNOSIS — E876 Hypokalemia: Secondary | ICD-10-CM | POA: Diagnosis not present

## 2023-02-02 DIAGNOSIS — F02818 Dementia in other diseases classified elsewhere, unspecified severity, with other behavioral disturbance: Secondary | ICD-10-CM | POA: Diagnosis not present

## 2023-02-02 DIAGNOSIS — I5042 Chronic combined systolic (congestive) and diastolic (congestive) heart failure: Secondary | ICD-10-CM | POA: Diagnosis not present

## 2023-02-02 DIAGNOSIS — M5440 Lumbago with sciatica, unspecified side: Secondary | ICD-10-CM | POA: Diagnosis not present

## 2023-02-02 DIAGNOSIS — L03115 Cellulitis of right lower limb: Secondary | ICD-10-CM | POA: Diagnosis not present

## 2023-02-02 DIAGNOSIS — M1990 Unspecified osteoarthritis, unspecified site: Secondary | ICD-10-CM | POA: Diagnosis not present

## 2023-02-02 DIAGNOSIS — I110 Hypertensive heart disease with heart failure: Secondary | ICD-10-CM | POA: Diagnosis not present

## 2023-02-02 DIAGNOSIS — I872 Venous insufficiency (chronic) (peripheral): Secondary | ICD-10-CM | POA: Diagnosis not present

## 2023-02-02 DIAGNOSIS — I480 Paroxysmal atrial fibrillation: Secondary | ICD-10-CM | POA: Diagnosis not present

## 2023-02-02 DIAGNOSIS — G8929 Other chronic pain: Secondary | ICD-10-CM | POA: Diagnosis not present

## 2023-02-02 DIAGNOSIS — K760 Fatty (change of) liver, not elsewhere classified: Secondary | ICD-10-CM | POA: Diagnosis not present

## 2023-02-02 DIAGNOSIS — F05 Delirium due to known physiological condition: Secondary | ICD-10-CM | POA: Diagnosis not present

## 2023-02-04 DIAGNOSIS — M5440 Lumbago with sciatica, unspecified side: Secondary | ICD-10-CM | POA: Diagnosis not present

## 2023-02-04 DIAGNOSIS — I480 Paroxysmal atrial fibrillation: Secondary | ICD-10-CM | POA: Diagnosis not present

## 2023-02-04 DIAGNOSIS — E785 Hyperlipidemia, unspecified: Secondary | ICD-10-CM | POA: Diagnosis not present

## 2023-02-04 DIAGNOSIS — G309 Alzheimer's disease, unspecified: Secondary | ICD-10-CM | POA: Diagnosis not present

## 2023-02-04 DIAGNOSIS — I872 Venous insufficiency (chronic) (peripheral): Secondary | ICD-10-CM | POA: Diagnosis not present

## 2023-02-04 DIAGNOSIS — M1990 Unspecified osteoarthritis, unspecified site: Secondary | ICD-10-CM | POA: Diagnosis not present

## 2023-02-04 DIAGNOSIS — G8929 Other chronic pain: Secondary | ICD-10-CM | POA: Diagnosis not present

## 2023-02-04 DIAGNOSIS — E538 Deficiency of other specified B group vitamins: Secondary | ICD-10-CM | POA: Diagnosis not present

## 2023-02-04 DIAGNOSIS — K760 Fatty (change of) liver, not elsewhere classified: Secondary | ICD-10-CM | POA: Diagnosis not present

## 2023-02-04 DIAGNOSIS — E876 Hypokalemia: Secondary | ICD-10-CM | POA: Diagnosis not present

## 2023-02-04 DIAGNOSIS — I110 Hypertensive heart disease with heart failure: Secondary | ICD-10-CM | POA: Diagnosis not present

## 2023-02-04 DIAGNOSIS — R32 Unspecified urinary incontinence: Secondary | ICD-10-CM | POA: Diagnosis not present

## 2023-02-04 DIAGNOSIS — F02818 Dementia in other diseases classified elsewhere, unspecified severity, with other behavioral disturbance: Secondary | ICD-10-CM | POA: Diagnosis not present

## 2023-02-04 DIAGNOSIS — I5042 Chronic combined systolic (congestive) and diastolic (congestive) heart failure: Secondary | ICD-10-CM | POA: Diagnosis not present

## 2023-02-04 DIAGNOSIS — F0284 Dementia in other diseases classified elsewhere, unspecified severity, with anxiety: Secondary | ICD-10-CM | POA: Diagnosis not present

## 2023-02-04 DIAGNOSIS — F05 Delirium due to known physiological condition: Secondary | ICD-10-CM | POA: Diagnosis not present

## 2023-02-06 ENCOUNTER — Telehealth: Payer: Self-pay | Admitting: Internal Medicine

## 2023-02-06 NOTE — Telephone Encounter (Signed)
Ok for verbals 

## 2023-02-06 NOTE — Telephone Encounter (Signed)
HH ORDERS   Caller Name: [facility] Agency Name: [facility] Phone #: [phone](secure)  Service Requested: home health PT re-certification (examples: OT/PT/Skilled Nursing/Social Work/Speech Therapy/Wound Care)  Frequency of Visits: 1X a week for 8 weeks

## 2023-02-07 ENCOUNTER — Encounter: Payer: Self-pay | Admitting: Internal Medicine

## 2023-02-07 NOTE — Telephone Encounter (Signed)
error 

## 2023-02-08 DIAGNOSIS — F02818 Dementia in other diseases classified elsewhere, unspecified severity, with other behavioral disturbance: Secondary | ICD-10-CM | POA: Diagnosis not present

## 2023-02-08 DIAGNOSIS — E785 Hyperlipidemia, unspecified: Secondary | ICD-10-CM | POA: Diagnosis not present

## 2023-02-08 DIAGNOSIS — E876 Hypokalemia: Secondary | ICD-10-CM | POA: Diagnosis not present

## 2023-02-08 DIAGNOSIS — R32 Unspecified urinary incontinence: Secondary | ICD-10-CM | POA: Diagnosis not present

## 2023-02-08 DIAGNOSIS — M5440 Lumbago with sciatica, unspecified side: Secondary | ICD-10-CM | POA: Diagnosis not present

## 2023-02-08 DIAGNOSIS — I480 Paroxysmal atrial fibrillation: Secondary | ICD-10-CM | POA: Diagnosis not present

## 2023-02-08 DIAGNOSIS — I5042 Chronic combined systolic (congestive) and diastolic (congestive) heart failure: Secondary | ICD-10-CM | POA: Diagnosis not present

## 2023-02-08 DIAGNOSIS — M1990 Unspecified osteoarthritis, unspecified site: Secondary | ICD-10-CM | POA: Diagnosis not present

## 2023-02-08 DIAGNOSIS — G309 Alzheimer's disease, unspecified: Secondary | ICD-10-CM | POA: Diagnosis not present

## 2023-02-08 DIAGNOSIS — K760 Fatty (change of) liver, not elsewhere classified: Secondary | ICD-10-CM | POA: Diagnosis not present

## 2023-02-08 DIAGNOSIS — I110 Hypertensive heart disease with heart failure: Secondary | ICD-10-CM | POA: Diagnosis not present

## 2023-02-08 DIAGNOSIS — G8929 Other chronic pain: Secondary | ICD-10-CM | POA: Diagnosis not present

## 2023-02-08 DIAGNOSIS — F0284 Dementia in other diseases classified elsewhere, unspecified severity, with anxiety: Secondary | ICD-10-CM | POA: Diagnosis not present

## 2023-02-08 DIAGNOSIS — E538 Deficiency of other specified B group vitamins: Secondary | ICD-10-CM | POA: Diagnosis not present

## 2023-02-08 DIAGNOSIS — F05 Delirium due to known physiological condition: Secondary | ICD-10-CM | POA: Diagnosis not present

## 2023-02-08 DIAGNOSIS — I872 Venous insufficiency (chronic) (peripheral): Secondary | ICD-10-CM | POA: Diagnosis not present

## 2023-02-08 NOTE — Telephone Encounter (Signed)
LDVM with Verbal OK for PT re-cert 1x a week for 8 weeks.

## 2023-02-14 DIAGNOSIS — I110 Hypertensive heart disease with heart failure: Secondary | ICD-10-CM | POA: Diagnosis not present

## 2023-02-14 DIAGNOSIS — R32 Unspecified urinary incontinence: Secondary | ICD-10-CM | POA: Diagnosis not present

## 2023-02-14 DIAGNOSIS — F02818 Dementia in other diseases classified elsewhere, unspecified severity, with other behavioral disturbance: Secondary | ICD-10-CM | POA: Diagnosis not present

## 2023-02-14 DIAGNOSIS — M5440 Lumbago with sciatica, unspecified side: Secondary | ICD-10-CM | POA: Diagnosis not present

## 2023-02-14 DIAGNOSIS — G309 Alzheimer's disease, unspecified: Secondary | ICD-10-CM | POA: Diagnosis not present

## 2023-02-14 DIAGNOSIS — M1990 Unspecified osteoarthritis, unspecified site: Secondary | ICD-10-CM | POA: Diagnosis not present

## 2023-02-14 DIAGNOSIS — E538 Deficiency of other specified B group vitamins: Secondary | ICD-10-CM | POA: Diagnosis not present

## 2023-02-14 DIAGNOSIS — K760 Fatty (change of) liver, not elsewhere classified: Secondary | ICD-10-CM | POA: Diagnosis not present

## 2023-02-14 DIAGNOSIS — F0284 Dementia in other diseases classified elsewhere, unspecified severity, with anxiety: Secondary | ICD-10-CM | POA: Diagnosis not present

## 2023-02-14 DIAGNOSIS — I5042 Chronic combined systolic (congestive) and diastolic (congestive) heart failure: Secondary | ICD-10-CM | POA: Diagnosis not present

## 2023-02-14 DIAGNOSIS — E876 Hypokalemia: Secondary | ICD-10-CM | POA: Diagnosis not present

## 2023-02-14 DIAGNOSIS — G8929 Other chronic pain: Secondary | ICD-10-CM | POA: Diagnosis not present

## 2023-02-14 DIAGNOSIS — I480 Paroxysmal atrial fibrillation: Secondary | ICD-10-CM | POA: Diagnosis not present

## 2023-02-14 DIAGNOSIS — I872 Venous insufficiency (chronic) (peripheral): Secondary | ICD-10-CM | POA: Diagnosis not present

## 2023-02-14 DIAGNOSIS — E785 Hyperlipidemia, unspecified: Secondary | ICD-10-CM | POA: Diagnosis not present

## 2023-02-14 DIAGNOSIS — F05 Delirium due to known physiological condition: Secondary | ICD-10-CM | POA: Diagnosis not present

## 2023-02-15 ENCOUNTER — Telehealth: Payer: Self-pay | Admitting: Internal Medicine

## 2023-02-15 NOTE — Telephone Encounter (Signed)
Patient's son said patient has bad knee pain. He would like to know if there is anything OTC that could be recommended because he said it hurts to put weight on it. He doesn't want to bring her in for an appointment if he doesn't need to.  He would also like to know if patient's PCP thinks she would be okay going to [place] by car. They are considering having her move there to live with her daughter.  Maurine Minister would like a call back at [phone].

## 2023-02-17 NOTE — Telephone Encounter (Signed)
She can use Aleve 1 daily for 3 days. Use Tylenol 500 mg 2 tablets 2-3 times a day as needed  She can go to [place]. Increase aspirin to twice a day around travel time for 2 to 3 days for blood clot prevention for travel.  We can do a steroid injection in the knee if needed.  Thank you

## 2023-02-19 DIAGNOSIS — I872 Venous insufficiency (chronic) (peripheral): Secondary | ICD-10-CM | POA: Diagnosis not present

## 2023-02-19 DIAGNOSIS — F02818 Dementia in other diseases classified elsewhere, unspecified severity, with other behavioral disturbance: Secondary | ICD-10-CM | POA: Diagnosis not present

## 2023-02-19 DIAGNOSIS — E538 Deficiency of other specified B group vitamins: Secondary | ICD-10-CM | POA: Diagnosis not present

## 2023-02-19 DIAGNOSIS — F05 Delirium due to known physiological condition: Secondary | ICD-10-CM | POA: Diagnosis not present

## 2023-02-19 DIAGNOSIS — M1990 Unspecified osteoarthritis, unspecified site: Secondary | ICD-10-CM | POA: Diagnosis not present

## 2023-02-19 DIAGNOSIS — R32 Unspecified urinary incontinence: Secondary | ICD-10-CM | POA: Diagnosis not present

## 2023-02-19 DIAGNOSIS — I110 Hypertensive heart disease with heart failure: Secondary | ICD-10-CM | POA: Diagnosis not present

## 2023-02-19 DIAGNOSIS — I480 Paroxysmal atrial fibrillation: Secondary | ICD-10-CM | POA: Diagnosis not present

## 2023-02-19 DIAGNOSIS — G309 Alzheimer's disease, unspecified: Secondary | ICD-10-CM | POA: Diagnosis not present

## 2023-02-19 DIAGNOSIS — G8929 Other chronic pain: Secondary | ICD-10-CM | POA: Diagnosis not present

## 2023-02-19 DIAGNOSIS — F0284 Dementia in other diseases classified elsewhere, unspecified severity, with anxiety: Secondary | ICD-10-CM | POA: Diagnosis not present

## 2023-02-19 DIAGNOSIS — E876 Hypokalemia: Secondary | ICD-10-CM | POA: Diagnosis not present

## 2023-02-19 DIAGNOSIS — M5440 Lumbago with sciatica, unspecified side: Secondary | ICD-10-CM | POA: Diagnosis not present

## 2023-02-19 DIAGNOSIS — I5042 Chronic combined systolic (congestive) and diastolic (congestive) heart failure: Secondary | ICD-10-CM | POA: Diagnosis not present

## 2023-02-19 DIAGNOSIS — K760 Fatty (change of) liver, not elsewhere classified: Secondary | ICD-10-CM | POA: Diagnosis not present

## 2023-02-19 DIAGNOSIS — E785 Hyperlipidemia, unspecified: Secondary | ICD-10-CM | POA: Diagnosis not present

## 2023-02-19 NOTE — Telephone Encounter (Signed)
Tried calling ps son to inform him of Dr.Plotnikov's instructions. Phone rang out and then went to busy signal.

## 2023-03-01 DIAGNOSIS — G309 Alzheimer's disease, unspecified: Secondary | ICD-10-CM | POA: Diagnosis not present

## 2023-03-01 DIAGNOSIS — F02818 Dementia in other diseases classified elsewhere, unspecified severity, with other behavioral disturbance: Secondary | ICD-10-CM | POA: Diagnosis not present

## 2023-03-01 DIAGNOSIS — E785 Hyperlipidemia, unspecified: Secondary | ICD-10-CM | POA: Diagnosis not present

## 2023-03-01 DIAGNOSIS — E538 Deficiency of other specified B group vitamins: Secondary | ICD-10-CM | POA: Diagnosis not present

## 2023-03-01 DIAGNOSIS — E876 Hypokalemia: Secondary | ICD-10-CM | POA: Diagnosis not present

## 2023-03-01 DIAGNOSIS — G8929 Other chronic pain: Secondary | ICD-10-CM | POA: Diagnosis not present

## 2023-03-01 DIAGNOSIS — K760 Fatty (change of) liver, not elsewhere classified: Secondary | ICD-10-CM | POA: Diagnosis not present

## 2023-03-01 DIAGNOSIS — F0284 Dementia in other diseases classified elsewhere, unspecified severity, with anxiety: Secondary | ICD-10-CM | POA: Diagnosis not present

## 2023-03-01 DIAGNOSIS — I872 Venous insufficiency (chronic) (peripheral): Secondary | ICD-10-CM | POA: Diagnosis not present

## 2023-03-01 DIAGNOSIS — M1990 Unspecified osteoarthritis, unspecified site: Secondary | ICD-10-CM | POA: Diagnosis not present

## 2023-03-01 DIAGNOSIS — F05 Delirium due to known physiological condition: Secondary | ICD-10-CM | POA: Diagnosis not present

## 2023-03-01 DIAGNOSIS — R32 Unspecified urinary incontinence: Secondary | ICD-10-CM | POA: Diagnosis not present

## 2023-03-01 DIAGNOSIS — I480 Paroxysmal atrial fibrillation: Secondary | ICD-10-CM | POA: Diagnosis not present

## 2023-03-01 DIAGNOSIS — I5042 Chronic combined systolic (congestive) and diastolic (congestive) heart failure: Secondary | ICD-10-CM | POA: Diagnosis not present

## 2023-03-01 DIAGNOSIS — M5440 Lumbago with sciatica, unspecified side: Secondary | ICD-10-CM | POA: Diagnosis not present

## 2023-03-01 DIAGNOSIS — I110 Hypertensive heart disease with heart failure: Secondary | ICD-10-CM | POA: Diagnosis not present

## 2023-03-05 ENCOUNTER — Other Ambulatory Visit: Payer: Self-pay | Admitting: Radiology

## 2023-03-05 ENCOUNTER — Telehealth: Payer: Self-pay | Admitting: Internal Medicine

## 2023-03-05 NOTE — Telephone Encounter (Signed)
Prescription Request  03/05/2023  LOV: 12/19/2022  What is the name of the medication or equipment? simvastatin (ZOCOR) 5 MG tablet   Have you contacted your pharmacy to request a refill? No   Which pharmacy would you like this sent to?  [id number] [place], [place] - [address] AT [facility] [address] [facility] [address] [place] [address] Phone: [phone] Fax: [phone]    Patient notified that their request is being sent to the clinical staff for review and that they should receive a response within 2 business days.   Please advise at Mobile [phone] (mobile)

## 2023-03-06 DIAGNOSIS — F05 Delirium due to known physiological condition: Secondary | ICD-10-CM | POA: Diagnosis not present

## 2023-03-06 DIAGNOSIS — I110 Hypertensive heart disease with heart failure: Secondary | ICD-10-CM | POA: Diagnosis not present

## 2023-03-06 DIAGNOSIS — E785 Hyperlipidemia, unspecified: Secondary | ICD-10-CM | POA: Diagnosis not present

## 2023-03-06 DIAGNOSIS — M1990 Unspecified osteoarthritis, unspecified site: Secondary | ICD-10-CM | POA: Diagnosis not present

## 2023-03-06 DIAGNOSIS — G309 Alzheimer's disease, unspecified: Secondary | ICD-10-CM | POA: Diagnosis not present

## 2023-03-06 DIAGNOSIS — E538 Deficiency of other specified B group vitamins: Secondary | ICD-10-CM | POA: Diagnosis not present

## 2023-03-06 DIAGNOSIS — E876 Hypokalemia: Secondary | ICD-10-CM | POA: Diagnosis not present

## 2023-03-06 DIAGNOSIS — I872 Venous insufficiency (chronic) (peripheral): Secondary | ICD-10-CM | POA: Diagnosis not present

## 2023-03-06 DIAGNOSIS — G8929 Other chronic pain: Secondary | ICD-10-CM | POA: Diagnosis not present

## 2023-03-06 DIAGNOSIS — F02818 Dementia in other diseases classified elsewhere, unspecified severity, with other behavioral disturbance: Secondary | ICD-10-CM | POA: Diagnosis not present

## 2023-03-06 DIAGNOSIS — R32 Unspecified urinary incontinence: Secondary | ICD-10-CM | POA: Diagnosis not present

## 2023-03-06 DIAGNOSIS — F0284 Dementia in other diseases classified elsewhere, unspecified severity, with anxiety: Secondary | ICD-10-CM | POA: Diagnosis not present

## 2023-03-06 DIAGNOSIS — K760 Fatty (change of) liver, not elsewhere classified: Secondary | ICD-10-CM | POA: Diagnosis not present

## 2023-03-06 DIAGNOSIS — I480 Paroxysmal atrial fibrillation: Secondary | ICD-10-CM | POA: Diagnosis not present

## 2023-03-06 DIAGNOSIS — I5042 Chronic combined systolic (congestive) and diastolic (congestive) heart failure: Secondary | ICD-10-CM | POA: Diagnosis not present

## 2023-03-06 DIAGNOSIS — M5440 Lumbago with sciatica, unspecified side: Secondary | ICD-10-CM | POA: Diagnosis not present

## 2023-03-06 MED ORDER — SIMVASTATIN 5 MG PO TABS: 5.0000 mg | ORAL_TABLET | Freq: Every day | ORAL | 5 refills | Status: DC

## 2023-03-06 NOTE — Telephone Encounter (Signed)
Okay to refill for 6 months.  Thank you

## 2023-03-09 ENCOUNTER — Other Ambulatory Visit: Payer: Self-pay | Admitting: Internal Medicine

## 2023-03-09 DIAGNOSIS — R32 Unspecified urinary incontinence: Secondary | ICD-10-CM | POA: Diagnosis not present

## 2023-03-09 DIAGNOSIS — G8929 Other chronic pain: Secondary | ICD-10-CM | POA: Diagnosis not present

## 2023-03-09 DIAGNOSIS — M1990 Unspecified osteoarthritis, unspecified site: Secondary | ICD-10-CM | POA: Diagnosis not present

## 2023-03-09 DIAGNOSIS — E876 Hypokalemia: Secondary | ICD-10-CM | POA: Diagnosis not present

## 2023-03-09 DIAGNOSIS — G309 Alzheimer's disease, unspecified: Secondary | ICD-10-CM | POA: Diagnosis not present

## 2023-03-09 DIAGNOSIS — F0284 Dementia in other diseases classified elsewhere, unspecified severity, with anxiety: Secondary | ICD-10-CM | POA: Diagnosis not present

## 2023-03-09 DIAGNOSIS — F02818 Dementia in other diseases classified elsewhere, unspecified severity, with other behavioral disturbance: Secondary | ICD-10-CM | POA: Diagnosis not present

## 2023-03-09 DIAGNOSIS — I480 Paroxysmal atrial fibrillation: Secondary | ICD-10-CM | POA: Diagnosis not present

## 2023-03-09 DIAGNOSIS — I872 Venous insufficiency (chronic) (peripheral): Secondary | ICD-10-CM | POA: Diagnosis not present

## 2023-03-09 DIAGNOSIS — F05 Delirium due to known physiological condition: Secondary | ICD-10-CM | POA: Diagnosis not present

## 2023-03-09 DIAGNOSIS — I110 Hypertensive heart disease with heart failure: Secondary | ICD-10-CM | POA: Diagnosis not present

## 2023-03-09 DIAGNOSIS — E538 Deficiency of other specified B group vitamins: Secondary | ICD-10-CM | POA: Diagnosis not present

## 2023-03-09 DIAGNOSIS — I5042 Chronic combined systolic (congestive) and diastolic (congestive) heart failure: Secondary | ICD-10-CM | POA: Diagnosis not present

## 2023-03-09 DIAGNOSIS — M5440 Lumbago with sciatica, unspecified side: Secondary | ICD-10-CM | POA: Diagnosis not present

## 2023-03-09 DIAGNOSIS — K760 Fatty (change of) liver, not elsewhere classified: Secondary | ICD-10-CM | POA: Diagnosis not present

## 2023-03-09 DIAGNOSIS — E785 Hyperlipidemia, unspecified: Secondary | ICD-10-CM | POA: Diagnosis not present

## 2023-03-13 ENCOUNTER — Ambulatory Visit: Payer: Medicare Other | Admitting: Internal Medicine

## 2023-03-14 DIAGNOSIS — M5440 Lumbago with sciatica, unspecified side: Secondary | ICD-10-CM | POA: Diagnosis not present

## 2023-03-14 DIAGNOSIS — I110 Hypertensive heart disease with heart failure: Secondary | ICD-10-CM | POA: Diagnosis not present

## 2023-03-14 DIAGNOSIS — K760 Fatty (change of) liver, not elsewhere classified: Secondary | ICD-10-CM | POA: Diagnosis not present

## 2023-03-14 DIAGNOSIS — E876 Hypokalemia: Secondary | ICD-10-CM | POA: Diagnosis not present

## 2023-03-14 DIAGNOSIS — G309 Alzheimer's disease, unspecified: Secondary | ICD-10-CM | POA: Diagnosis not present

## 2023-03-14 DIAGNOSIS — I872 Venous insufficiency (chronic) (peripheral): Secondary | ICD-10-CM | POA: Diagnosis not present

## 2023-03-14 DIAGNOSIS — I480 Paroxysmal atrial fibrillation: Secondary | ICD-10-CM | POA: Diagnosis not present

## 2023-03-14 DIAGNOSIS — F02818 Dementia in other diseases classified elsewhere, unspecified severity, with other behavioral disturbance: Secondary | ICD-10-CM | POA: Diagnosis not present

## 2023-03-14 DIAGNOSIS — G8929 Other chronic pain: Secondary | ICD-10-CM | POA: Diagnosis not present

## 2023-03-14 DIAGNOSIS — E785 Hyperlipidemia, unspecified: Secondary | ICD-10-CM | POA: Diagnosis not present

## 2023-03-14 DIAGNOSIS — M1990 Unspecified osteoarthritis, unspecified site: Secondary | ICD-10-CM | POA: Diagnosis not present

## 2023-03-14 DIAGNOSIS — E538 Deficiency of other specified B group vitamins: Secondary | ICD-10-CM | POA: Diagnosis not present

## 2023-03-14 DIAGNOSIS — R32 Unspecified urinary incontinence: Secondary | ICD-10-CM | POA: Diagnosis not present

## 2023-03-14 DIAGNOSIS — F0284 Dementia in other diseases classified elsewhere, unspecified severity, with anxiety: Secondary | ICD-10-CM | POA: Diagnosis not present

## 2023-03-14 DIAGNOSIS — I5042 Chronic combined systolic (congestive) and diastolic (congestive) heart failure: Secondary | ICD-10-CM | POA: Diagnosis not present

## 2023-03-14 DIAGNOSIS — F05 Delirium due to known physiological condition: Secondary | ICD-10-CM | POA: Diagnosis not present

## 2023-03-22 DIAGNOSIS — F05 Delirium due to known physiological condition: Secondary | ICD-10-CM | POA: Diagnosis not present

## 2023-03-22 DIAGNOSIS — G309 Alzheimer's disease, unspecified: Secondary | ICD-10-CM | POA: Diagnosis not present

## 2023-03-22 DIAGNOSIS — E538 Deficiency of other specified B group vitamins: Secondary | ICD-10-CM | POA: Diagnosis not present

## 2023-03-22 DIAGNOSIS — I480 Paroxysmal atrial fibrillation: Secondary | ICD-10-CM | POA: Diagnosis not present

## 2023-03-22 DIAGNOSIS — M5440 Lumbago with sciatica, unspecified side: Secondary | ICD-10-CM | POA: Diagnosis not present

## 2023-03-22 DIAGNOSIS — G8929 Other chronic pain: Secondary | ICD-10-CM | POA: Diagnosis not present

## 2023-03-22 DIAGNOSIS — K760 Fatty (change of) liver, not elsewhere classified: Secondary | ICD-10-CM | POA: Diagnosis not present

## 2023-03-22 DIAGNOSIS — I872 Venous insufficiency (chronic) (peripheral): Secondary | ICD-10-CM | POA: Diagnosis not present

## 2023-03-22 DIAGNOSIS — F0284 Dementia in other diseases classified elsewhere, unspecified severity, with anxiety: Secondary | ICD-10-CM | POA: Diagnosis not present

## 2023-03-22 DIAGNOSIS — E876 Hypokalemia: Secondary | ICD-10-CM | POA: Diagnosis not present

## 2023-03-22 DIAGNOSIS — M1990 Unspecified osteoarthritis, unspecified site: Secondary | ICD-10-CM | POA: Diagnosis not present

## 2023-03-22 DIAGNOSIS — F02818 Dementia in other diseases classified elsewhere, unspecified severity, with other behavioral disturbance: Secondary | ICD-10-CM | POA: Diagnosis not present

## 2023-03-22 DIAGNOSIS — R32 Unspecified urinary incontinence: Secondary | ICD-10-CM | POA: Diagnosis not present

## 2023-03-22 DIAGNOSIS — I5042 Chronic combined systolic (congestive) and diastolic (congestive) heart failure: Secondary | ICD-10-CM | POA: Diagnosis not present

## 2023-03-22 DIAGNOSIS — I110 Hypertensive heart disease with heart failure: Secondary | ICD-10-CM | POA: Diagnosis not present

## 2023-03-22 DIAGNOSIS — E785 Hyperlipidemia, unspecified: Secondary | ICD-10-CM | POA: Diagnosis not present

## 2023-03-27 ENCOUNTER — Telehealth: Payer: Self-pay

## 2023-03-27 ENCOUNTER — Ambulatory Visit (INDEPENDENT_AMBULATORY_CARE_PROVIDER_SITE_OTHER): Payer: Medicare Other

## 2023-03-27 VITALS — Ht 61.0 in | Wt 162.0 lb

## 2023-03-27 DIAGNOSIS — Z0000 Encounter for general adult medical examination without abnormal findings: Secondary | ICD-10-CM | POA: Diagnosis not present

## 2023-03-27 DIAGNOSIS — R7309 Other abnormal glucose: Secondary | ICD-10-CM

## 2023-03-27 DIAGNOSIS — R609 Edema, unspecified: Secondary | ICD-10-CM

## 2023-03-27 DIAGNOSIS — E538 Deficiency of other specified B group vitamins: Secondary | ICD-10-CM

## 2023-03-27 DIAGNOSIS — R531 Weakness: Secondary | ICD-10-CM

## 2023-03-27 NOTE — Patient Instructions (Signed)
Kathleen Mejia , Thank you for taking time to come for your Medicare Wellness Visit. I appreciate your ongoing commitment to your health goals. Please review the following plan we discussed and let me know if I can assist you in the future.   Referrals/Orders/Follow-Ups/Clinician Recommendations: no  This is a list of the screening recommended for you and due dates:  Health Maintenance  Topic Date Due   DTaP/Tdap/Td vaccine (1 - Tdap) Never done   Zoster (Shingles) Vaccine (1 of 2) Never done   COVID-19 Vaccine (4 - 2023-24 season) 02/04/2023   Medicare Annual Wellness Visit  03/26/2024   Pneumonia Vaccine  Completed   DEXA scan (bone density measurement)  Completed   HPV Vaccine  Aged Out   Flu Shot  Discontinued    Advanced directives: (Copy Requested) Please bring a copy of your health care power of attorney and living will to the office to be added to your chart at your convenience.  Next Medicare Annual Wellness Visit scheduled for next year: No

## 2023-03-27 NOTE — Progress Notes (Cosign Needed)
Subjective:   Zariaha Goenner Pritz is a 89 y.o. female who presents for Medicare Annual (Subsequent) preventive examination.  Visit Complete: Virtual I connected with  Giuliana Belinski Demorest on 03/27/23 by a audio enabled telemedicine application and verified that I am speaking with the correct person using two identifiers.  Patient Location: Home  Provider Location: Office/Clinic  I discussed the limitations of evaluation and management by telemedicine. The patient expressed understanding and agreed to proceed.  Vital Signs: Because this visit was a virtual/telehealth visit, some criteria may be missing or patient reported. Any vitals not documented were not able to be obtained and vitals that have been documented are patient reported.  Cardiac Risk Factors include: advanced age (>29men, >63 women);dyslipidemia;family history of premature cardiovascular disease;hypertension;sedentary lifestyle     Objective:    Today's Vitals   03/27/23 1533  Weight: 162 lb (73.5 kg)  Height: 5\' 1"  (1.549 m)  PainSc: 0-No pain   Body mass index is 30.61 kg/m.     03/27/2023    3:38 PM 11/03/2022    2:01 PM 04/14/2022    2:38 PM 02/17/2021    3:37 PM 06/14/2020   12:53 PM 02/12/2019    4:21 PM 02/04/2019    2:50 PM  Advanced Directives  Does Patient Have a Medical Advance Directive? Yes No No Yes Yes Yes Yes  Type of Estate agent of [place];Living will   Living will;Healthcare Power of [employer] Power of [place];Living will  Healthcare Power of [place];Living will  Does patient want to make changes to medical advance directive?    No - Patient declined     Copy of Healthcare Power of Attorney in Chart? No - copy requested   No - copy requested     Would patient like information on creating a medical advance directive?  No - Patient declined No - Patient declined        Current Medications (verified) Outpatient Encounter Medications as of 03/27/2023  Medication Sig    acetaminophen (TYLENOL) 500 MG tablet Take 500 mg by mouth See admin instructions. Take 500 mg by mouth in the morning and with supper/evening meal   aspirin EC 81 MG tablet Take 81 mg by mouth See admin instructions. Take 81 mg by mouth in the morning and with supper/evening meal   bumetanide (BUMEX) 0.5 MG tablet TAKE 1 TABLET BY MOUTH DAILY WITH BREAKFAST   donepezil (ARICEPT) 5 MG tablet TAKE 1 TABLET(5 MG) BY MOUTH AT BEDTIME   potassium chloride SA (KLOR-CON M) 20 MEQ tablet Take 2 tablets (40 mEq total) by mouth daily.   simvastatin (ZOCOR) 5 MG tablet Take 1 tablet (5 mg total) by mouth at bedtime. =   torsemide (DEMADEX) 20 MG tablet Take 1 tablet (20 mg total) by mouth daily.   Ascorbic Acid (VITAMIN C) 1000 MG tablet Take 1,000 mg by mouth daily.  (Patient not taking: Reported on 11/03/2022)   Cod Liver Oil 1000 MG CAPS Take 1,000 mg by mouth daily.  (Patient not taking: Reported on 11/03/2022)   docusate sodium (COLACE) 100 MG capsule Take 1 capsule (100 mg total) by mouth 2 (two) times daily. (Patient not taking: Reported on 11/03/2022)   ketoconazole (NIZORAL) 2 % cream Apply 1 Application topically 2 (two) times daily. (Patient not taking: Reported on 11/03/2022)   polyethylene glycol (MIRALAX / GLYCOLAX) 17 g packet Take 17 g by mouth daily as needed for mild constipation. (Patient not taking: Reported on 11/03/2022)  Zinc Sulfate (ZINC 15 PO) Take 50 mg by mouth daily.  (Patient not taking: Reported on 11/03/2022)   No facility-administered encounter medications on file as of 03/27/2023.    Allergies (verified) Eliquis [apixaban], Losartan, Maxzide [hydrochlorothiazide w-triamterene], and Metoprolol   History: Past Medical History:  Diagnosis Date   Anxiety    Atrial fibrillation (HCC)    Cystocele with prolapse    Dislocated shoulder 2012   right   Edema    left leg   Fatty liver    Gait disturbance    Gallstone    1.5 cm   Ganglion cyst    Glaucoma    HTN  (hypertension)    Hyperlipidemia    LBP (low back pain)    Obesity    Osteoarthritis    Rosacea    Shortness of breath    with activities and exertion, pt denies 01/23/2019   Venous insufficiency of leg    Vertigo    not current 01/23/2019   Vitamin B 12 deficiency    Past Surgical History:  Procedure Laterality Date   ABDOMINAL HYSTERECTOMY     CHOLECYSTECTOMY N/A 03/27/2014   Procedure: LAPAROSCOPIC CHOLECYSTECTOMY WITH INTRAOPERATIVE CHOLANGIOGRAM;  Surgeon: Claud Kelp, MD;  Location: [facility] OR;  Service: General;  Laterality: N/A;   CYSTOCELE REPAIR N/A 01/28/2019   Procedure: ANTERIOR REPAIR (CYSTOCELE);  Surgeon: Romualdo Bolk, MD;  Location: [facility];  Service: Gynecology;  Laterality: N/A;   CYSTOSCOPY N/A 01/28/2019   Procedure: CYSTOSCOPY;  Surgeon: Romualdo Bolk, MD;  Location: [facility];  Service: Gynecology;  Laterality: N/A;   TONSILLECTOMY     VAGINAL HYSTERECTOMY Left 01/28/2019   Procedure: HYSTERECTOMY VAGINAL WITH LEFT SALPINGECTOMY;  Surgeon: Romualdo Bolk, MD;  Location: [facility];  Service: Gynecology;  Laterality: Left;   Family History  Problem Relation Age of Onset   Pancreatic cancer Mother    Hypertension Mother    Breast cancer Mother    Kidney failure Sister    Social History   Socioeconomic History   Marital status: Widowed    Spouse name: Not on file   Number of children: Not on file   Years of education: Not on file   Highest education level: Not on file  Occupational History   Occupation: Retired  Tobacco Use   Smoking status: Never   Smokeless tobacco: Never  Vaping Use   Vaping status: Never Used  Substance and Sexual Activity   Alcohol use: No   Drug use: Never   Sexual activity: Not Currently    Birth control/protection: Post-menopausal  Other Topics Concern   Not on file  Social History Narrative   Not taking vaccines         Social Determinants of  Health   Financial Resource Strain: Low Risk  (03/27/2023)   Overall Financial Resource Strain (CARDIA)    Difficulty of Paying Living Expenses: Not hard at all  Food Insecurity: No Food Insecurity (03/27/2023)   Hunger Vital Sign    Worried About Running Out of Food in the Last Year: Never true    Ran Out of Food in the Last Year: Never true  Transportation Needs: No Transportation Needs (03/27/2023)   PRAPARE - Administrator, Civil Service (Medical): No    Lack of Transportation (Non-Medical): No  Physical Activity: Inactive (03/27/2023)   Exercise Vital Sign    Days of Exercise per Week: 0 days    Minutes of  Exercise per Session: 0 min  Stress: No Stress Concern Present (03/27/2023)   [employer] of Occupational Health - Occupational Stress Questionnaire    Feeling of Stress : Not at all  Social Connections: Moderately Integrated (03/27/2023)   Social Connection and Isolation Panel [NHANES]    Frequency of Communication with Friends and Family: More than three times a week    Frequency of Social Gatherings with Friends and Family: More than three times a week    Attends Religious Services: More than 4 times per year    Active Member of [employer] or Organizations: Yes    Attends Banker Meetings: More than 4 times per year    Marital Status: Widowed    Tobacco Counseling Counseling given: Not Answered   Clinical Intake:  Pre-visit preparation completed: Yes  Pain : No/denies pain Pain Score: 0-No pain     BMI - recorded: 30.61 Nutritional Status: BMI > 30  Obese Nutritional Risks: None Diabetes: No  How often do you need to have someone help you when you read instructions, pamphlets, or other written materials from your doctor or pharmacy?: 1 - Never What is the last grade level you completed in school?: HSG  Interpreter Needed?: No  Information entered by :: Laurin Paulo N. Cederic Mozley, LPN.   Activities of Daily Living    03/27/2023     3:47 PM 11/03/2022    6:41 PM  In your present state of health, do you have any difficulty performing the following activities:  Hearing? 0   Vision? 0   Difficulty concentrating or making decisions? 1   Walking or climbing stairs? 1   Dressing or bathing? 0   Doing errands, shopping? 1 1  Preparing Food and eating ? N   Using the Toilet? N   In the past six months, have you accidently leaked urine? Y   Do you have problems with loss of bowel control? N   Managing your Medications? Y   Managing your Finances? Y   Housekeeping or managing your Housekeeping? Y     Patient Care Team: Plotnikov, Georgina Quint, MD as PCP - General Tobias Alexander, OD as Referring Physician (Optometry)  Indicate any recent Medical Services you may have received from other than [facility] providers in the past year (date may be approximate).     Assessment:   This is a routine wellness examination for Rondell.  Hearing/Vision screen Hearing Screening - Comments:: Patient denied any hearing difficulty.   No hearing aids.   Vision Screening - Comments:: Patient does wear corrective lenses/contacts.  Annual eye exam done by: Tobias Alexander, OD.    Goals Addressed   None   Depression Screen    03/27/2023    3:42 PM 08/02/2022    3:06 PM 05/04/2022    3:52 PM 04/14/2022    2:43 PM 02/17/2021    2:54 PM 05/04/2020    2:46 PM 04/24/2019    3:34 PM  PHQ 2/9 Scores  PHQ - 2 Score 0 0 0 6 0 0 0  PHQ- 9 Score 0 6 0 18       Fall Risk    03/27/2023    3:40 PM 08/02/2022    3:06 PM 08/02/2022    3:05 PM 05/04/2022    3:51 PM 04/14/2022    3:13 PM  Fall Risk   Falls in the past year? 0 0 0 0 0  Number falls in past yr: 0 0 0 0 0  Injury with  Fall? 0 0 0 0 0  Risk for fall due to : No Fall Risks No Fall Risks No Fall Risks No Fall Risks No Fall Risks  Follow up Falls prevention discussed Falls evaluation completed Falls evaluation completed Falls evaluation completed Falls prevention discussed    MEDICARE  RISK AT HOME: Medicare Risk at Home Any stairs in or around the home?: No If so, are there any without handrails?: No Home free of loose throw rugs in walkways, pet beds, electrical cords, etc?: Yes Adequate lighting in your home to reduce risk of falls?: Yes Life alert?: Yes (phone) Use of a cane, walker or w/c?: Yes Grab bars in the bathroom?: Yes Shower chair or bench in shower?: Yes Elevated toilet seat or a handicapped toilet?: No  TIMED UP AND GO:  Was the test performed?  No    Cognitive Function:    03/27/2023    3:42 PM 04/14/2022    3:15 PM 01/10/2016    9:26 AM  MMSE - Mini Mental State Exam  Not completed: Unable to complete Refused;Unable to complete --        04/14/2022    3:14 PM  6CIT Screen  What Year? 0 points  What month? 0 points  What time? 0 points  Count back from 20 0 points  Months in reverse 0 points  Repeat phrase 0 points  Total Score 0 points    Immunizations Immunization History  Administered Date(s) Administered   PFIZER(Purple Top)SARS-COV-2 Vaccination 08/10/2019, 09/09/2019, 05/08/2020   PNEUMOCOCCAL CONJUGATE-20 08/17/2021    TDAP status: Due, Education has been provided regarding the importance of this vaccine. Advised may receive this vaccine at local pharmacy or Health Dept. Aware to provide a copy of the vaccination record if obtained from local pharmacy or Health Dept. Verbalized acceptance and understanding.  Flu Vaccine status: Declined, Education has been provided regarding the importance of this vaccine but patient still declined. Advised may receive this vaccine at local pharmacy or Health Dept. Aware to provide a copy of the vaccination record if obtained from local pharmacy or Health Dept. Verbalized acceptance and understanding.  Pneumococcal vaccine status: Up to date  Covid-19 vaccine status: Completed vaccines  Qualifies for Shingles Vaccine? Yes   Zostavax completed No   Shingrix Completed?: No.    Education has  been provided regarding the importance of this vaccine. Patient has been advised to call insurance company to determine out of pocket expense if they have not yet received this vaccine. Advised may also receive vaccine at local pharmacy or Health Dept. Verbalized acceptance and understanding.  Screening Tests Health Maintenance  Topic Date Due   DTaP/Tdap/Td (1 - Tdap) Never done   Zoster Vaccines- Shingrix (1 of 2) Never done   COVID-19 Vaccine (4 - 2023-24 season) 02/04/2023   Medicare Annual Wellness (AWV)  03/26/2024   Pneumonia Vaccine 12+ Years old  Completed   DEXA SCAN  Completed   HPV VACCINES  Aged Out   INFLUENZA VACCINE  Discontinued    Health Maintenance  Health Maintenance Due  Topic Date Due   DTaP/Tdap/Td (1 - Tdap) Never done   Zoster Vaccines- Shingrix (1 of 2) Never done   COVID-19 Vaccine (4 - 2023-24 season) 02/04/2023    Colorectal cancer screening: No longer required.   Mammogram status: Completed 01/21/2021. Repeat every year  Bone Density status: No longer required.  Lung Cancer Screening: (Low Dose CT Chest recommended if Age 84-80 years, 20 pack-year currently smoking OR have quit  w/in 15years.) does not qualify.   Lung Cancer Screening Referral: no  Additional Screening:  Hepatitis C Screening: does not qualify.  Vision Screening: Recommended annual ophthalmology exams for early detection of glaucoma and other disorders of the eye. Is the patient up to date with their annual eye exam?  Yes  Who is the provider or what is the name of the office in which the patient attends annual eye exams? Staci Palmer, OD. If pt is not established with a provider, would they like to be referred to a provider to establish care? No .   Dental Screening: Recommended annual dental exams for proper oral hygiene  Diabetic Foot Exam: N/A  Community Resource Referral / Chronic Care Management: CRR required this visit?  No   CCM required this visit?  No      Plan:     I have personally reviewed and noted the following in the patient's chart:   Medical and social history Use of alcohol, tobacco or illicit drugs  Current medications and supplements including opioid prescriptions. Patient is not currently taking opioid prescriptions. Functional ability and status Nutritional status Physical activity Advanced directives List of other physicians Hospitalizations, surgeries, and ER visits in previous 12 months Vitals Screenings to include cognitive, depression, and falls Referrals and appointments  In addition, I have reviewed and discussed with patient certain preventive protocols, quality metrics, and best practice recommendations. A written personalized care plan for preventive services as well as general preventive health recommendations were provided to patient.     Mickeal Needy, LPN   [id number]   After Visit Summary: (Mail) Due to this being a telephonic visit, the after visit summary with patients personalized plan was offered to patient via mail   Nurse Notes: n/a   Medical screening examination/treatment/procedure(s) were performed by non-physician practitioner and as supervising physician I was immediately available for consultation/collaboration.  I agree with above. Jacinta Shoe, MD

## 2023-03-27 NOTE — Telephone Encounter (Signed)
Spoke with son, He is requesting additional PT services from [place] with [facility].  He feels that she is still having some weakness and needs to be re-evaluated.  PT ID # [id number] with [facility] [phone].  Also, patient will be moving to [place] hopefully by Christmas and will need blood work and ejection fraction completed as well.

## 2023-03-28 DIAGNOSIS — G309 Alzheimer's disease, unspecified: Secondary | ICD-10-CM | POA: Diagnosis not present

## 2023-03-28 DIAGNOSIS — M5440 Lumbago with sciatica, unspecified side: Secondary | ICD-10-CM | POA: Diagnosis not present

## 2023-03-28 DIAGNOSIS — G8929 Other chronic pain: Secondary | ICD-10-CM | POA: Diagnosis not present

## 2023-03-28 DIAGNOSIS — I480 Paroxysmal atrial fibrillation: Secondary | ICD-10-CM | POA: Diagnosis not present

## 2023-03-28 DIAGNOSIS — M1990 Unspecified osteoarthritis, unspecified site: Secondary | ICD-10-CM | POA: Diagnosis not present

## 2023-03-28 DIAGNOSIS — E876 Hypokalemia: Secondary | ICD-10-CM | POA: Diagnosis not present

## 2023-03-28 DIAGNOSIS — I5042 Chronic combined systolic (congestive) and diastolic (congestive) heart failure: Secondary | ICD-10-CM | POA: Diagnosis not present

## 2023-03-28 DIAGNOSIS — I110 Hypertensive heart disease with heart failure: Secondary | ICD-10-CM | POA: Diagnosis not present

## 2023-03-28 DIAGNOSIS — F02818 Dementia in other diseases classified elsewhere, unspecified severity, with other behavioral disturbance: Secondary | ICD-10-CM | POA: Diagnosis not present

## 2023-03-28 DIAGNOSIS — F05 Delirium due to known physiological condition: Secondary | ICD-10-CM | POA: Diagnosis not present

## 2023-03-28 DIAGNOSIS — E785 Hyperlipidemia, unspecified: Secondary | ICD-10-CM | POA: Diagnosis not present

## 2023-03-28 DIAGNOSIS — R32 Unspecified urinary incontinence: Secondary | ICD-10-CM | POA: Diagnosis not present

## 2023-03-28 DIAGNOSIS — I872 Venous insufficiency (chronic) (peripheral): Secondary | ICD-10-CM | POA: Diagnosis not present

## 2023-03-28 DIAGNOSIS — K760 Fatty (change of) liver, not elsewhere classified: Secondary | ICD-10-CM | POA: Diagnosis not present

## 2023-03-28 DIAGNOSIS — E538 Deficiency of other specified B group vitamins: Secondary | ICD-10-CM | POA: Diagnosis not present

## 2023-03-28 DIAGNOSIS — F0284 Dementia in other diseases classified elsewhere, unspecified severity, with anxiety: Secondary | ICD-10-CM | POA: Diagnosis not present

## 2023-03-28 NOTE — Telephone Encounter (Signed)
Okay to authorize additional physical therapy.  I will order the labs.  Thank you

## 2023-03-29 NOTE — Telephone Encounter (Signed)
The number provided was not a direct number to speak with Marchelle Folks for PT continuation.  Please inform pts son if he calls back that we need Amanda's direct number or a number to call with verbal orders.  Provider has stated " Okay to authorize additional physical therapy.  I will order the labs.  Thank you

## 2023-03-30 ENCOUNTER — Ambulatory Visit (INDEPENDENT_AMBULATORY_CARE_PROVIDER_SITE_OTHER): Payer: Medicare Other | Admitting: Internal Medicine

## 2023-03-30 ENCOUNTER — Encounter: Payer: Self-pay | Admitting: Internal Medicine

## 2023-03-30 VITALS — BP 118/74 | HR 59 | Temp 97.9°F | Ht 61.0 in

## 2023-03-30 DIAGNOSIS — R609 Edema, unspecified: Secondary | ICD-10-CM | POA: Diagnosis not present

## 2023-03-30 DIAGNOSIS — R531 Weakness: Secondary | ICD-10-CM | POA: Diagnosis not present

## 2023-03-30 DIAGNOSIS — E559 Vitamin D deficiency, unspecified: Secondary | ICD-10-CM

## 2023-03-30 DIAGNOSIS — E538 Deficiency of other specified B group vitamins: Secondary | ICD-10-CM | POA: Diagnosis not present

## 2023-03-30 DIAGNOSIS — R7309 Other abnormal glucose: Secondary | ICD-10-CM | POA: Diagnosis not present

## 2023-03-30 DIAGNOSIS — I10 Essential (primary) hypertension: Secondary | ICD-10-CM | POA: Diagnosis not present

## 2023-03-30 LAB — CBC WITH DIFFERENTIAL/PLATELET
Basophils Absolute: 0 10*3/uL (ref 0.0–0.1)
Basophils Relative: 0.3 % (ref 0.0–3.0)
Eosinophils Absolute: 0.1 10*3/uL (ref 0.0–0.7)
Eosinophils Relative: 1.4 % (ref 0.0–5.0)
HCT: 46.9 % — ABNORMAL HIGH (ref 36.0–46.0)
Hemoglobin: 15.3 g/dL — ABNORMAL HIGH (ref 12.0–15.0)
Lymphocytes Relative: 21.8 % (ref 12.0–46.0)
Lymphs Abs: 1.4 10*3/uL (ref 0.7–4.0)
MCHC: 32.6 g/dL (ref 30.0–36.0)
MCV: 98.1 fL (ref 78.0–100.0)
Monocytes Absolute: 0.5 10*3/uL (ref 0.1–1.0)
Monocytes Relative: 8.5 % (ref 3.0–12.0)
Neutro Abs: 4.4 10*3/uL (ref 1.4–7.7)
Neutrophils Relative %: 68 % (ref 43.0–77.0)
Platelets: 205 10*3/uL (ref 150.0–400.0)
RBC: 4.79 Mil/uL (ref 3.87–5.11)
RDW: 13.2 % (ref 11.5–15.5)
WBC: 6.5 10*3/uL (ref 4.0–10.5)

## 2023-03-30 LAB — COMPREHENSIVE METABOLIC PANEL
ALT: 15 U/L (ref 0–35)
AST: 16 U/L (ref 0–37)
Albumin: 4 g/dL (ref 3.5–5.2)
Alkaline Phosphatase: 78 U/L (ref 39–117)
BUN: 21 mg/dL (ref 6–23)
CO2: 34 meq/L — ABNORMAL HIGH (ref 19–32)
Calcium: 9.6 mg/dL (ref 8.4–10.5)
Chloride: 100 meq/L (ref 96–112)
Creatinine, Ser: 0.71 mg/dL (ref 0.40–1.20)
GFR: 76.26 mL/min (ref >60.00)
Glucose, Bld: 172 mg/dL — ABNORMAL HIGH (ref 70–99)
Potassium: 3.8 meq/L (ref 3.5–5.1)
Sodium: 142 meq/L (ref 135–145)
Total Bilirubin: 0.4 mg/dL (ref 0.2–1.2)
Total Protein: 6.9 g/dL (ref 6.0–8.3)

## 2023-03-30 LAB — TSH: TSH: 1.38 u[IU]/mL (ref 0.35–5.50)

## 2023-03-30 LAB — VITAMIN B12: Vitamin B-12: >1537 pg/mL — ABNORMAL HIGH (ref 211–911)

## 2023-03-30 LAB — HEMOGLOBIN A1C: Hgb A1c MFr Bld: 5.8 % (ref 4.6–6.5)

## 2023-03-30 LAB — T4, FREE: Free T4: 0.95 ng/dL (ref 0.60–1.60)

## 2023-03-30 MED ORDER — CYANOCOBALAMIN 1000 MCG/ML IJ SOLN
1000.0000 ug | Freq: Once | INTRAMUSCULAR | Status: AC
  Administered 2023-03-30: 1000 ug via INTRAMUSCULAR

## 2023-03-30 NOTE — Addendum Note (Signed)
Addended by: Jerrell Belfast on: 03/30/2023 03:50 PM   Modules accepted: Orders

## 2023-03-30 NOTE — Assessment & Plan Note (Signed)
Chronic - better Bumex qam Torsemide qd KCl

## 2023-03-30 NOTE — Assessment & Plan Note (Signed)
Chronic - better Bumex qam Torsemide qd KCl Compression socks

## 2023-03-30 NOTE — Progress Notes (Signed)
She is well-developed. She is obese.  HENT:     Head: Normocephalic.     Right Ear: External ear normal.     Left Ear: External ear normal.     Nose: Nose normal.  Eyes:     General:        Right eye: No discharge.        Left eye: No discharge.     Conjunctiva/sclera: Conjunctivae normal.     Pupils: Pupils are equal, round, and reactive to light.  Neck:     Thyroid: No thyromegaly.     Vascular: No JVD.     Trachea: No tracheal deviation.  Cardiovascular:     Rate and Rhythm: Normal rate and regular rhythm.     Heart sounds: Normal heart sounds.  Pulmonary:     Effort: No respiratory distress.     Breath sounds: No stridor. No wheezing.  Abdominal:      General: Bowel sounds are normal. There is no distension.     Palpations: Abdomen is soft. There is no mass.     Tenderness: There is no abdominal tenderness. There is no guarding or rebound.  Musculoskeletal:        General: No tenderness.     Cervical back: Normal range of motion and neck supple. No rigidity.  Lymphadenopathy:     Cervical: No cervical adenopathy.  Skin:    Findings: No erythema or rash.  Neurological:     Mental Status: Mental status is at baseline.     Cranial Nerves: No cranial nerve deficit.     Motor: Weakness present. No abnormal muscle tone.     Coordination: Coordination abnormal.     Gait: Gait abnormal.     Deep Tendon Reflexes: Reflexes normal.  Psychiatric:        Behavior: Behavior normal.        Thought Content: Thought content normal.        Judgment: Judgment normal.   In a w/c No open wounds  Lab Results  Component Value Date   WBC 7.8 12/19/2022   HGB 13.6 12/19/2022   HCT 41.8 12/19/2022   PLT 235.0 12/19/2022   GLUCOSE 99 12/19/2022   CHOL 148 02/17/2021   TRIG 82.0 02/17/2021   HDL 50.80 02/17/2021   LDLDIRECT 139.3 02/21/2008   LDLCALC 81 02/17/2021   ALT 13 12/19/2022   AST 14 12/19/2022   NA 140 12/19/2022   K 4.7 12/19/2022   CL 103 12/19/2022   CREATININE 0.73 12/19/2022   BUN 12 12/19/2022   CO2 33 (H) 12/19/2022   TSH 1.38 12/19/2022   HGBA1C 5.7 12/19/2022   MICROALBUR 0.4 06/25/2008    ECHOCARDIOGRAM COMPLETE  Result Date: 11/04/2022    ECHOCARDIOGRAM REPORT   Patient Name:   Kathleen Mejia Date of Exam: 11/04/2022 Medical Rec #:  [id number]         Height:       61.0 in Accession #:    [id number]        Weight:       171.7 lb Date of Birth:  17-Nov-1933         BSA:          1.770 m Patient Age:    89 years          BP:           119/64 mmHg Patient Gender: F  She is well-developed. She is obese.  HENT:     Head: Normocephalic.     Right Ear: External ear normal.     Left Ear: External ear normal.     Nose: Nose normal.  Eyes:     General:        Right eye: No discharge.        Left eye: No discharge.     Conjunctiva/sclera: Conjunctivae normal.     Pupils: Pupils are equal, round, and reactive to light.  Neck:     Thyroid: No thyromegaly.     Vascular: No JVD.     Trachea: No tracheal deviation.  Cardiovascular:     Rate and Rhythm: Normal rate and regular rhythm.     Heart sounds: Normal heart sounds.  Pulmonary:     Effort: No respiratory distress.     Breath sounds: No stridor. No wheezing.  Abdominal:      General: Bowel sounds are normal. There is no distension.     Palpations: Abdomen is soft. There is no mass.     Tenderness: There is no abdominal tenderness. There is no guarding or rebound.  Musculoskeletal:        General: No tenderness.     Cervical back: Normal range of motion and neck supple. No rigidity.  Lymphadenopathy:     Cervical: No cervical adenopathy.  Skin:    Findings: No erythema or rash.  Neurological:     Mental Status: Mental status is at baseline.     Cranial Nerves: No cranial nerve deficit.     Motor: Weakness present. No abnormal muscle tone.     Coordination: Coordination abnormal.     Gait: Gait abnormal.     Deep Tendon Reflexes: Reflexes normal.  Psychiatric:        Behavior: Behavior normal.        Thought Content: Thought content normal.        Judgment: Judgment normal.   In a w/c No open wounds  Lab Results  Component Value Date   WBC 7.8 12/19/2022   HGB 13.6 12/19/2022   HCT 41.8 12/19/2022   PLT 235.0 12/19/2022   GLUCOSE 99 12/19/2022   CHOL 148 02/17/2021   TRIG 82.0 02/17/2021   HDL 50.80 02/17/2021   LDLDIRECT 139.3 02/21/2008   LDLCALC 81 02/17/2021   ALT 13 12/19/2022   AST 14 12/19/2022   NA 140 12/19/2022   K 4.7 12/19/2022   CL 103 12/19/2022   CREATININE 0.73 12/19/2022   BUN 12 12/19/2022   CO2 33 (H) 12/19/2022   TSH 1.38 12/19/2022   HGBA1C 5.7 12/19/2022   MICROALBUR 0.4 06/25/2008    ECHOCARDIOGRAM COMPLETE  Result Date: 11/04/2022    ECHOCARDIOGRAM REPORT   Patient Name:   Kathleen Mejia Date of Exam: 11/04/2022 Medical Rec #:  [id number]         Height:       61.0 in Accession #:    [id number]        Weight:       171.7 lb Date of Birth:  17-Nov-1933         BSA:          1.770 m Patient Age:    89 years          BP:           119/64 mmHg Patient Gender: F  She is well-developed. She is obese.  HENT:     Head: Normocephalic.     Right Ear: External ear normal.     Left Ear: External ear normal.     Nose: Nose normal.  Eyes:     General:        Right eye: No discharge.        Left eye: No discharge.     Conjunctiva/sclera: Conjunctivae normal.     Pupils: Pupils are equal, round, and reactive to light.  Neck:     Thyroid: No thyromegaly.     Vascular: No JVD.     Trachea: No tracheal deviation.  Cardiovascular:     Rate and Rhythm: Normal rate and regular rhythm.     Heart sounds: Normal heart sounds.  Pulmonary:     Effort: No respiratory distress.     Breath sounds: No stridor. No wheezing.  Abdominal:      General: Bowel sounds are normal. There is no distension.     Palpations: Abdomen is soft. There is no mass.     Tenderness: There is no abdominal tenderness. There is no guarding or rebound.  Musculoskeletal:        General: No tenderness.     Cervical back: Normal range of motion and neck supple. No rigidity.  Lymphadenopathy:     Cervical: No cervical adenopathy.  Skin:    Findings: No erythema or rash.  Neurological:     Mental Status: Mental status is at baseline.     Cranial Nerves: No cranial nerve deficit.     Motor: Weakness present. No abnormal muscle tone.     Coordination: Coordination abnormal.     Gait: Gait abnormal.     Deep Tendon Reflexes: Reflexes normal.  Psychiatric:        Behavior: Behavior normal.        Thought Content: Thought content normal.        Judgment: Judgment normal.   In a w/c No open wounds  Lab Results  Component Value Date   WBC 7.8 12/19/2022   HGB 13.6 12/19/2022   HCT 41.8 12/19/2022   PLT 235.0 12/19/2022   GLUCOSE 99 12/19/2022   CHOL 148 02/17/2021   TRIG 82.0 02/17/2021   HDL 50.80 02/17/2021   LDLDIRECT 139.3 02/21/2008   LDLCALC 81 02/17/2021   ALT 13 12/19/2022   AST 14 12/19/2022   NA 140 12/19/2022   K 4.7 12/19/2022   CL 103 12/19/2022   CREATININE 0.73 12/19/2022   BUN 12 12/19/2022   CO2 33 (H) 12/19/2022   TSH 1.38 12/19/2022   HGBA1C 5.7 12/19/2022   MICROALBUR 0.4 06/25/2008    ECHOCARDIOGRAM COMPLETE  Result Date: 11/04/2022    ECHOCARDIOGRAM REPORT   Patient Name:   Kathleen Mejia Date of Exam: 11/04/2022 Medical Rec #:  [id number]         Height:       61.0 in Accession #:    [id number]        Weight:       171.7 lb Date of Birth:  17-Nov-1933         BSA:          1.770 m Patient Age:    89 years          BP:           119/64 mmHg Patient Gender: F  Vmean:     327.0 cm/s   SHUNTS MV E velocity: 79.80 cm/s  Systemic VTI:  0.16 m MV A velocity: 28.10 cm/s  Systemic Diam: 2.00 cm MV E/A ratio:  2.84 Donato Schultz MD Electronically signed by Donato Schultz MD Signature Date/Time: 11/04/2022/11:03:25 AM    Final    VAS [place] LOWER EXTREMITY VENOUS (DVT) (7a-7p)  Result Date: 11/03/2022  Lower Venous DVT Study Patient Name:  KAMARIE ROCKER  Date of Exam:   11/03/2022 Medical Rec #: [id number]          Accession #:    [id number] Date of Birth: 1934/03/31          Patient Gender: F Patient Age:   17 years Exam Location:   [facility] Procedure:      VAS [place] LOWER EXTREMITY VENOUS (DVT) Referring Phys: JON KNAPP --------------------------------------------------------------------------------  Indications: Swelling.  Risk Factors: None identified. Limitations: Body habitus and poor ultrasound/tissue interface. Comparison Study: No prior studies. Performing Technologist: Chanda Busing RVT  Examination Guidelines: A complete evaluation includes B-mode imaging, spectral Doppler, color Doppler, and power Doppler as needed of all accessible portions of each vessel. Bilateral testing is considered an integral part of a complete examination. Limited examinations for reoccurring indications may be performed as noted. The reflux portion of the exam is performed with the patient in reverse Trendelenburg.  +-----+---------------+---------+-----------+----------+--------------+ RIGHTCompressibilityPhasicitySpontaneityPropertiesThrombus Aging +-----+---------------+---------+-----------+----------+--------------+ CFV  Full           Yes      Yes                                 +-----+---------------+---------+-----------+----------+--------------+   +---------+---------------+---------+-----------+----------+-------------------+ LEFT     CompressibilityPhasicitySpontaneityPropertiesThrombus Aging      +---------+---------------+---------+-----------+----------+-------------------+ CFV      Full           Yes      Yes                                      +---------+---------------+---------+-----------+----------+-------------------+ SFJ      Full                                                             +---------+---------------+---------+-----------+----------+-------------------+ FV Prox  Full                                                             +---------+---------------+---------+-----------+----------+-------------------+ FV Mid   Full                                                              +---------+---------------+---------+-----------+----------+-------------------+ FV Distal               Yes      Yes                                      +---------+---------------+---------+-----------+----------+-------------------+  Vmean:     327.0 cm/s   SHUNTS MV E velocity: 79.80 cm/s  Systemic VTI:  0.16 m MV A velocity: 28.10 cm/s  Systemic Diam: 2.00 cm MV E/A ratio:  2.84 Donato Schultz MD Electronically signed by Donato Schultz MD Signature Date/Time: 11/04/2022/11:03:25 AM    Final    VAS [place] LOWER EXTREMITY VENOUS (DVT) (7a-7p)  Result Date: 11/03/2022  Lower Venous DVT Study Patient Name:  KAMARIE ROCKER  Date of Exam:   11/03/2022 Medical Rec #: [id number]          Accession #:    [id number] Date of Birth: 1934/03/31          Patient Gender: F Patient Age:   17 years Exam Location:   [facility] Procedure:      VAS [place] LOWER EXTREMITY VENOUS (DVT) Referring Phys: JON KNAPP --------------------------------------------------------------------------------  Indications: Swelling.  Risk Factors: None identified. Limitations: Body habitus and poor ultrasound/tissue interface. Comparison Study: No prior studies. Performing Technologist: Chanda Busing RVT  Examination Guidelines: A complete evaluation includes B-mode imaging, spectral Doppler, color Doppler, and power Doppler as needed of all accessible portions of each vessel. Bilateral testing is considered an integral part of a complete examination. Limited examinations for reoccurring indications may be performed as noted. The reflux portion of the exam is performed with the patient in reverse Trendelenburg.  +-----+---------------+---------+-----------+----------+--------------+ RIGHTCompressibilityPhasicitySpontaneityPropertiesThrombus Aging +-----+---------------+---------+-----------+----------+--------------+ CFV  Full           Yes      Yes                                 +-----+---------------+---------+-----------+----------+--------------+   +---------+---------------+---------+-----------+----------+-------------------+ LEFT     CompressibilityPhasicitySpontaneityPropertiesThrombus Aging      +---------+---------------+---------+-----------+----------+-------------------+ CFV      Full           Yes      Yes                                      +---------+---------------+---------+-----------+----------+-------------------+ SFJ      Full                                                             +---------+---------------+---------+-----------+----------+-------------------+ FV Prox  Full                                                             +---------+---------------+---------+-----------+----------+-------------------+ FV Mid   Full                                                              +---------+---------------+---------+-----------+----------+-------------------+ FV Distal               Yes      Yes                                      +---------+---------------+---------+-----------+----------+-------------------+  She is well-developed. She is obese.  HENT:     Head: Normocephalic.     Right Ear: External ear normal.     Left Ear: External ear normal.     Nose: Nose normal.  Eyes:     General:        Right eye: No discharge.        Left eye: No discharge.     Conjunctiva/sclera: Conjunctivae normal.     Pupils: Pupils are equal, round, and reactive to light.  Neck:     Thyroid: No thyromegaly.     Vascular: No JVD.     Trachea: No tracheal deviation.  Cardiovascular:     Rate and Rhythm: Normal rate and regular rhythm.     Heart sounds: Normal heart sounds.  Pulmonary:     Effort: No respiratory distress.     Breath sounds: No stridor. No wheezing.  Abdominal:      General: Bowel sounds are normal. There is no distension.     Palpations: Abdomen is soft. There is no mass.     Tenderness: There is no abdominal tenderness. There is no guarding or rebound.  Musculoskeletal:        General: No tenderness.     Cervical back: Normal range of motion and neck supple. No rigidity.  Lymphadenopathy:     Cervical: No cervical adenopathy.  Skin:    Findings: No erythema or rash.  Neurological:     Mental Status: Mental status is at baseline.     Cranial Nerves: No cranial nerve deficit.     Motor: Weakness present. No abnormal muscle tone.     Coordination: Coordination abnormal.     Gait: Gait abnormal.     Deep Tendon Reflexes: Reflexes normal.  Psychiatric:        Behavior: Behavior normal.        Thought Content: Thought content normal.        Judgment: Judgment normal.   In a w/c No open wounds  Lab Results  Component Value Date   WBC 7.8 12/19/2022   HGB 13.6 12/19/2022   HCT 41.8 12/19/2022   PLT 235.0 12/19/2022   GLUCOSE 99 12/19/2022   CHOL 148 02/17/2021   TRIG 82.0 02/17/2021   HDL 50.80 02/17/2021   LDLDIRECT 139.3 02/21/2008   LDLCALC 81 02/17/2021   ALT 13 12/19/2022   AST 14 12/19/2022   NA 140 12/19/2022   K 4.7 12/19/2022   CL 103 12/19/2022   CREATININE 0.73 12/19/2022   BUN 12 12/19/2022   CO2 33 (H) 12/19/2022   TSH 1.38 12/19/2022   HGBA1C 5.7 12/19/2022   MICROALBUR 0.4 06/25/2008    ECHOCARDIOGRAM COMPLETE  Result Date: 11/04/2022    ECHOCARDIOGRAM REPORT   Patient Name:   Kathleen Mejia Date of Exam: 11/04/2022 Medical Rec #:  [id number]         Height:       61.0 in Accession #:    [id number]        Weight:       171.7 lb Date of Birth:  17-Nov-1933         BSA:          1.770 m Patient Age:    89 years          BP:           119/64 mmHg Patient Gender: F

## 2023-03-30 NOTE — Assessment & Plan Note (Signed)
Rx for a w/c

## 2023-03-30 NOTE — Assessment & Plan Note (Signed)
Continue with current prescription therapy as reflected on the Med list.  

## 2023-03-30 NOTE — Assessment & Plan Note (Signed)
A1c

## 2023-03-30 NOTE — Assessment & Plan Note (Signed)
On B12 

## 2023-04-02 ENCOUNTER — Other Ambulatory Visit: Payer: Self-pay | Admitting: Internal Medicine

## 2023-04-02 DIAGNOSIS — R609 Edema, unspecified: Secondary | ICD-10-CM

## 2023-04-09 ENCOUNTER — Ambulatory Visit: Payer: Medicare Other | Admitting: Podiatry

## 2023-04-17 ENCOUNTER — Telehealth: Payer: Self-pay | Admitting: Internal Medicine

## 2023-04-17 DIAGNOSIS — R350 Frequency of micturition: Secondary | ICD-10-CM

## 2023-04-17 DIAGNOSIS — R609 Edema, unspecified: Secondary | ICD-10-CM

## 2023-04-17 NOTE — Telephone Encounter (Signed)
Pt son called stating the pt skin is very dry and and pt is urinating a lot while sleeping and she is very weak. Pt son he is concerned and wanting to know what he need to do. Please call pt.

## 2023-04-18 NOTE — Telephone Encounter (Signed)
Patient's son Maurine Minister called to check on the status of his request. He would like a call back at [phone].

## 2023-04-18 NOTE — Telephone Encounter (Signed)
Stop torsemide for couple days.  Hydrate well collect urine sample for urinalysis.  Come in for blood work.  Office visit if needed.  Thank you

## 2023-04-18 NOTE — Telephone Encounter (Signed)
Son call back stating pt is too weak to get in the car and she already did blood work last week.

## 2023-04-19 MED ORDER — CEFUROXIME AXETIL 250 MG PO TABS: 250.0000 mg | ORAL_TABLET | Freq: Two times a day (BID) | ORAL | 0 refills | Status: DC

## 2023-04-19 NOTE — Telephone Encounter (Signed)
I will send in an antibiotic prescription (Ceftin) for a possible urinary tract infection. Please take Kileen to ER if not better.

## 2023-04-19 NOTE — Addendum Note (Signed)
Addended by: Tresa Garter on: 04/19/2023 07:55 AM   Modules accepted: Orders

## 2023-04-19 NOTE — Telephone Encounter (Signed)
LDVM for pts son of Dr. Loren Racer instructions "I will send in an antibiotic prescription (Ceftin) for a possible urinary tract infection. Please take Skila to ER if not better."

## 2023-04-25 ENCOUNTER — Telehealth: Payer: Self-pay | Admitting: Internal Medicine

## 2023-04-25 NOTE — Telephone Encounter (Signed)
Patient's son called and said that he feels like she may need hospice care - she's not eating well and he is having to force her to drink sips of water.  He states that she is also sleeping a lot more.  Please advise    Call her son:  [phone]

## 2023-04-26 ENCOUNTER — Ambulatory Visit: Payer: Medicare Other | Admitting: Podiatry

## 2023-04-26 MED ORDER — CEFUROXIME AXETIL 250 MG PO TABS: 250.0000 mg | ORAL_TABLET | Freq: Two times a day (BID) | ORAL | 1 refills | Status: DC

## 2023-04-26 NOTE — Telephone Encounter (Signed)
I spoke w/Dennis Kerston is better after the abx Ceftin prn UTI sx's for the future needs Hospice in [place], [place] if needed later Thx

## 2023-05-23 ENCOUNTER — Ambulatory Visit: Payer: Medicare Other | Admitting: Internal Medicine

## 2023-05-23 ENCOUNTER — Encounter: Payer: Self-pay | Admitting: Internal Medicine

## 2023-05-23 VITALS — BP 118/80 | HR 95 | Temp 97.8°F | Ht 61.0 in

## 2023-05-23 DIAGNOSIS — M25561 Pain in right knee: Secondary | ICD-10-CM | POA: Diagnosis not present

## 2023-05-23 DIAGNOSIS — F0280 Dementia in other diseases classified elsewhere without behavioral disturbance: Secondary | ICD-10-CM | POA: Diagnosis not present

## 2023-05-23 DIAGNOSIS — M25562 Pain in left knee: Secondary | ICD-10-CM

## 2023-05-23 DIAGNOSIS — R269 Unspecified abnormalities of gait and mobility: Secondary | ICD-10-CM | POA: Diagnosis not present

## 2023-05-23 DIAGNOSIS — G309 Alzheimer's disease, unspecified: Secondary | ICD-10-CM | POA: Diagnosis not present

## 2023-05-23 DIAGNOSIS — G8929 Other chronic pain: Secondary | ICD-10-CM | POA: Diagnosis not present

## 2023-05-23 MED ORDER — TORSEMIDE 20 MG PO TABS: 20.0000 mg | ORAL_TABLET | Freq: Every day | ORAL | 3 refills | Status: AC

## 2023-05-23 MED ORDER — FLUCONAZOLE 100 MG PO TABS: ORAL_TABLET | ORAL | 1 refills | Status: AC

## 2023-05-23 MED ORDER — CEFUROXIME AXETIL 250 MG PO TABS: 250.0000 mg | ORAL_TABLET | Freq: Two times a day (BID) | ORAL | 1 refills | Status: AC

## 2023-05-23 MED ORDER — BUMETANIDE 0.5 MG PO TABS: ORAL_TABLET | ORAL | 3 refills | Status: DC

## 2023-05-23 MED ORDER — POTASSIUM CHLORIDE CRYS ER 20 MEQ PO TBCR: 40.0000 meq | EXTENDED_RELEASE_TABLET | Freq: Every day | ORAL | 3 refills | Status: AC

## 2023-05-23 MED ORDER — SIMVASTATIN 5 MG PO TABS: 5.0000 mg | ORAL_TABLET | Freq: Every day | ORAL | 3 refills | Status: AC

## 2023-05-23 NOTE — Patient Instructions (Signed)
  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 In the home. A cane, walker, crutch will not resolve issue with performing activities of daily living. A light wt wheelchair will allow patient to safely perform daily activities. Patient can safely propel the wheel chair in the home or has a caregiver who can provide assistance.

## 2023-05-23 NOTE — Progress Notes (Signed)
Subjective:  Patient ID: Kathleen Mejia, female    DOB: September 02, 1933  Age: 89 y.o. MRN: [id number]  CC: Follow-up (Pt is moving and needing a 3 mnth supply of her meds. Pt is also having a reoccurrence yeast infection under her breast and is needing Nystatin cream to tx sent into her pharmacy)   HPI Kathleen Mejia presents for dementia, UTI, edema, yeast infection Kathleen Mejia is here w/her son and daughter  Moving to [place] soon   Outpatient Medications Prior to Visit  Medication Sig Dispense Refill   acetaminophen (TYLENOL) 500 MG tablet Take 500 mg by mouth See admin instructions. Take 500 mg by mouth in the morning and with supper/evening meal     aspirin EC 81 MG tablet Take 81 mg by mouth See admin instructions. Take 81 mg by mouth in the morning and with supper/evening meal     donepezil (ARICEPT) 5 MG tablet TAKE 1 TABLET(5 MG) BY MOUTH AT BEDTIME 90 tablet 3   bumetanide (BUMEX) 0.5 MG tablet TAKE 1 TABLET BY MOUTH DAILY WITH BREAKFAST 90 tablet 3   cefUROXime (CEFTIN) 250 MG tablet Take 1 tablet (250 mg total) by mouth 2 (two) times daily with a meal. Use prn urinary infection 10 tablet 1   potassium chloride SA (KLOR-CON M) 20 MEQ tablet Take 2 tablets (40 mEq total) by mouth daily.     simvastatin (ZOCOR) 5 MG tablet Take 1 tablet (5 mg total) by mouth at bedtime. = 30 tablet 5   torsemide (DEMADEX) 20 MG tablet Take 1 tablet (20 mg total) by mouth daily.     Ascorbic Acid (VITAMIN C) 1000 MG tablet Take 1,000 mg by mouth daily.  (Patient not taking: Reported on 11/03/2022)     Cod Liver Oil 1000 MG CAPS Take 1,000 mg by mouth daily.  (Patient not taking: Reported on 11/03/2022)     docusate sodium (COLACE) 100 MG capsule Take 1 capsule (100 mg total) by mouth 2 (two) times daily. (Patient not taking: Reported on 11/03/2022) 60 capsule 0   ketoconazole (NIZORAL) 2 % cream Apply 1 Application topically 2 (two) times daily. (Patient not taking: Reported on 11/03/2022) 120 g 1    polyethylene glycol (MIRALAX / GLYCOLAX) 17 g packet Take 17 g by mouth daily as needed for mild constipation. (Patient not taking: Reported on 11/03/2022) 14 each 0   Zinc Sulfate (ZINC 15 PO) Take 50 mg by mouth daily.  (Patient not taking: Reported on 11/03/2022)     No facility-administered medications prior to visit.    ROS: Review of Systems  Constitutional:  Positive for fatigue. Negative for activity change, appetite change, chills and unexpected weight change.  HENT:  Negative for congestion, mouth sores and sinus pressure.   Eyes:  Negative for visual disturbance.  Respiratory:  Negative for cough and chest tightness.   Cardiovascular:  Positive for leg swelling.  Gastrointestinal:  Negative for abdominal pain and nausea.  Genitourinary:  Negative for difficulty urinating, frequency and vaginal pain.  Musculoskeletal:  Positive for arthralgias, back pain and gait problem.  Skin:  Negative for pallor and rash.  Neurological:  Negative for dizziness, tremors, weakness, numbness and headaches.  Psychiatric/Behavioral:  Positive for confusion and decreased concentration. Negative for hallucinations, sleep disturbance and suicidal ideas.     Objective:  BP 118/80 (BP Location: Left Arm, Patient Position: Sitting, Cuff Size: Normal)   Pulse 95   Temp 97.8 F (36.6 C) (Oral)   Ht 5\' 1"  (  1.549 m)   SpO2 96%   BMI 30.61 kg/m   BP Readings from Last 3 Encounters:  05/23/23 118/80  03/30/23 118/74  12/19/22 120/70    Wt Readings from Last 3 Encounters:  03/27/23 162 lb (73.5 kg)  12/19/22 162 lb (73.5 kg)  11/09/22 158 lb 4.6 oz (71.8 kg)    Physical Exam Constitutional:      General: She is not in acute distress.    Appearance: She is well-developed.  HENT:     Head: Normocephalic.     Right Ear: External ear normal.     Left Ear: External ear normal.     Nose: Nose normal.  Eyes:     General:        Right eye: No discharge.        Left eye: No discharge.      Conjunctiva/sclera: Conjunctivae normal.     Pupils: Pupils are equal, round, and reactive to light.  Neck:     Thyroid: No thyromegaly.     Vascular: No JVD.     Trachea: No tracheal deviation.  Cardiovascular:     Rate and Rhythm: Normal rate and regular rhythm.     Heart sounds: Normal heart sounds.  Pulmonary:     Effort: No respiratory distress.     Breath sounds: No stridor. No wheezing.  Abdominal:     General: Bowel sounds are normal. There is no distension.     Palpations: Abdomen is soft. There is no mass.     Tenderness: There is no abdominal tenderness. There is no guarding or rebound.  Musculoskeletal:        General: No tenderness.     Cervical back: Normal range of motion and neck supple. No rigidity.  Lymphadenopathy:     Cervical: No cervical adenopathy.  Skin:    Findings: No erythema or rash.  Neurological:     Mental Status: Mental status is at baseline.     Cranial Nerves: No cranial nerve deficit.     Motor: Weakness present. No abnormal muscle tone.     Coordination: Coordination normal.     Gait: Gait abnormal.     Deep Tendon Reflexes: Reflexes normal.  Psychiatric:        Behavior: Behavior normal.        Thought Content: Thought content normal.   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 In the home. A cane, walker, crutch will not resolve issue with performing activities of daily living. A light wt wheelchair will allow patient to safely perform daily activities. Patient can safely propel the wheel chair in the home or has a caregiver who can provide assistance.    Lab Results  Component Value Date   WBC 6.5 03/30/2023   HGB 15.3 (H) 03/30/2023   HCT 46.9 (H) 03/30/2023   PLT 205.0 03/30/2023   GLUCOSE 172 (H) 03/30/2023   CHOL 148 02/17/2021   TRIG 82.0 02/17/2021   HDL  50.80 02/17/2021   LDLDIRECT 139.3 02/21/2008   LDLCALC 81 02/17/2021   ALT 15 03/30/2023   AST 16 03/30/2023   NA 142 03/30/2023   K 3.8 03/30/2023   CL 100 03/30/2023   CREATININE 0.71 03/30/2023   BUN 21 03/30/2023   CO2 34 (H) 03/30/2023   TSH 1.38 03/30/2023   HGBA1C 5.8 03/30/2023  MICROALBUR 0.4 06/25/2008    ECHOCARDIOGRAM COMPLETE Result Date: 11/04/2022    ECHOCARDIOGRAM REPORT   Patient Name:   Kathleen Mejia Date of Exam: 11/04/2022 Medical Rec #:  [id number]         Height:       61.0 in Accession #:    [id number]        Weight:       171.7 lb Date of Birth:  July 06, 1933         BSA:          1.770 m Patient Age:    89 years          BP:           119/64 mmHg Patient Gender: F                 HR:           82 bpm. Exam Location:  Inpatient Procedure: 2D Echo, 3D Echo, Strain Analysis, Cardiac Doppler and Color Doppler Indications:    I50.21 CHF  History:        Patient has prior history of Echocardiogram examinations, most                 recent 07/18/2020. TIA, Mitral Valve Disease, Arrythmias:PVC;                 Risk Factors:Non-Smoker, Hypertension and Dyslipidemia.  Sonographer:    Dondra Prader RVT RCS Referring Phys: [id number] RAVI PAHWANI IMPRESSIONS  1. Left ventricular ejection fraction, by estimation, is 35 to 40%. The left ventricle has moderately decreased function. The left ventricle demonstrates global hypokinesis. The left ventricular internal cavity size was mildly dilated. Left ventricular diastolic parameters are consistent with Grade I diastolic dysfunction (impaired relaxation). The average left ventricular global longitudinal strain is -5.0 %. The global longitudinal strain is abnormal.  2. Right ventricular systolic function is normal. The right ventricular size is mildly enlarged. There is normal pulmonary artery systolic pressure. The estimated right ventricular systolic pressure is 28.0 mmHg.  3. Left atrial size was mildly dilated.  4. Right atrial size was mildly  dilated.  5. The mitral valve is normal in structure. Mild mitral valve regurgitation. No evidence of mitral stenosis.  6. Tricuspid valve regurgitation is mild to moderate.  7. The aortic valve is tricuspid. Aortic valve regurgitation is mild. No aortic stenosis is present.  8. The inferior vena cava is normal in size with greater than 50% respiratory variability, suggesting right atrial pressure of 3 mmHg. Comparison(s): 07/18/20 EF 45-50%. FINDINGS  Left Ventricle: Left ventricular ejection fraction, by estimation, is 35 to 40%. The left ventricle has moderately decreased function. The left ventricle demonstrates global hypokinesis. The average left ventricular global longitudinal strain is -5.0 %.  The global longitudinal strain is abnormal. The left ventricular internal cavity size was mildly dilated. There is no left ventricular hypertrophy. Left ventricular diastolic parameters are consistent with Grade I diastolic dysfunction (impaired relaxation). Right Ventricle: The right ventricular size is mildly enlarged. No increase in right ventricular wall thickness. Right ventricular systolic function is normal. There is normal pulmonary artery systolic pressure. The tricuspid regurgitant velocity is 2.50  m/s, and with an assumed right atrial pressure of 3 mmHg, the estimated right ventricular systolic pressure is 28.0 mmHg. Left Atrium: Left atrial size was mildly dilated. Right Atrium: Right atrial size was mildly dilated. Pericardium: There is no evidence of pericardial effusion. Mitral Valve: The mitral valve is normal in structure.  Mild mitral valve regurgitation. No evidence of mitral valve stenosis. Tricuspid Valve: The tricuspid valve is normal in structure. Tricuspid valve regurgitation is mild to moderate. No evidence of tricuspid stenosis. Aortic Valve: The aortic valve is tricuspid. Aortic valve regurgitation is mild. Aortic regurgitation PHT measures 696 msec. No aortic stenosis is present. Aortic  valve mean gradient measures 5.0 mmHg. Aortic valve peak gradient measures 9.5 mmHg. Aortic  valve area, by VTI measures 1.95 cm. Pulmonic Valve: The pulmonic valve was normal in structure. Pulmonic valve regurgitation is trivial. No evidence of pulmonic stenosis. Aorta: The aortic root is normal in size and structure. Venous: The inferior vena cava is normal in size with greater than 50% respiratory variability, suggesting right atrial pressure of 3 mmHg. IAS/Shunts: No atrial level shunt detected by color flow Doppler.  LEFT VENTRICLE PLAX 2D LVIDd:         5.40 cm LVIDs:         4.30 cm     2D Longitudinal Strain LV PW:         1.30 cm     2D Strain GLS Avg:     -5.0 % LV IVS:        1.10 cm LVOT diam:     2.00 cm LV SV:         51 LV SV Index:   29 LVOT Area:     3.14 cm  LV Volumes (MOD) LV vol d, MOD A2C: 76.8 ml LV vol d, MOD A4C: 92.9 ml LV vol s, MOD A2C: 47.0 ml LV vol s, MOD A4C: 52.2 ml LV SV MOD A2C:     29.8 ml LV SV MOD A4C:     92.9 ml LV SV MOD BP:      36.2 ml RIGHT VENTRICLE            IVC RV Basal diam:  3.95 cm    IVC diam: 1.30 cm RV Mid diam:    3.20 cm RV S prime:     8.56 cm/s TAPSE (M-mode): 1.4 cm LEFT ATRIUM             Index        RIGHT ATRIUM           Index LA diam:        4.00 cm 2.26 cm/m   RA Area:     18.70 cm LA Vol (A2C):   85.2 ml 48.13 ml/m  RA Volume:   58.90 ml  33.27 ml/m LA Vol (A4C):   74.1 ml 41.83 ml/m LA Biplane Vol: 81.7 ml 46.15 ml/m  AORTIC VALVE                     PULMONIC VALVE AV Area (Vmax):    2.04 cm      PV Vmax:       0.75 m/s AV Area (Vmean):   2.05 cm      PV Peak grad:  2.2 mmHg AV Area (VTI):     1.95 cm AV Vmax:           154.00 cm/s AV Vmean:          103.000 cm/s AV VTI:            0.261 m AV Peak Grad:      9.5 mmHg AV Mean Grad:      5.0 mmHg LVOT Vmax:         99.80 cm/s LVOT Vmean:  67.300 cm/s LVOT VTI:          0.162 m LVOT/AV VTI ratio: 0.62 AI PHT:            696 msec AR Vena Contracta: 0.40 cm  AORTA Ao Root diam: 2.90 cm Ao  Asc diam:  3.50 cm MITRAL VALVE               TRICUSPID VALVE MV Area (PHT): 3.56 cm    TR Peak grad:   25.0 mmHg MV Decel Time: 213 msec    TR Mean grad:   18.0 mmHg MR Peak grad: 70.2 mmHg    TR Vmax:        250.00 cm/s MR Mean grad: 48.0 mmHg    TR Vmean:       200.0 cm/s MR Vmax:      419.00 cm/s MR Vmean:     327.0 cm/s   SHUNTS MV E velocity: 79.80 cm/s  Systemic VTI:  0.16 m MV A velocity: 28.10 cm/s  Systemic Diam: 2.00 cm MV E/A ratio:  2.84 Donato Schultz MD Electronically signed by Donato Schultz MD Signature Date/Time: 11/04/2022/11:03:25 AM    Final    VAS [place] LOWER EXTREMITY VENOUS (DVT) (7a-7p) Result Date: 11/03/2022  Lower Venous DVT Study Patient Name:  Kathleen Mejia  Date of Exam:   11/03/2022 Medical Rec #: [id number]          Accession #:    [id number] Date of Birth: [birth year]          Patient Gender: F Patient Age:   8 years Exam Location:  [facility] Procedure:      VAS [place] LOWER EXTREMITY VENOUS (DVT) Referring Phys: JON KNAPP --------------------------------------------------------------------------------  Indications: Swelling.  Risk Factors: None identified. Limitations: Body habitus and poor ultrasound/tissue interface. Comparison Study: No prior studies. Performing Technologist: Chanda Busing RVT  Examination Guidelines: A complete evaluation includes B-mode imaging, spectral Doppler, color Doppler, and power Doppler as needed of all accessible portions of each vessel. Bilateral testing is considered an integral part of a complete examination. Limited examinations for reoccurring indications may be performed as noted. The reflux portion of the exam is performed with the patient in reverse Trendelenburg.  +-----+---------------+---------+-----------+----------+--------------+ RIGHTCompressibilityPhasicitySpontaneityPropertiesThrombus Aging +-----+---------------+---------+-----------+----------+--------------+ CFV  Full           Yes      Yes                                  +-----+---------------+---------+-----------+----------+--------------+   +---------+---------------+---------+-----------+----------+-------------------+ LEFT     CompressibilityPhasicitySpontaneityPropertiesThrombus Aging      +---------+---------------+---------+-----------+----------+-------------------+ CFV      Full           Yes      Yes                                      +---------+---------------+---------+-----------+----------+-------------------+ SFJ      Full                                                             +---------+---------------+---------+-----------+----------+-------------------+ FV Prox  Full                                                             +---------+---------------+---------+-----------+----------+-------------------+  FV Mid   Full                                                             +---------+---------------+---------+-----------+----------+-------------------+ FV Distal               Yes      Yes                                      +---------+---------------+---------+-----------+----------+-------------------+ PFV      Full                                                             +---------+---------------+---------+-----------+----------+-------------------+ POP      Full           Yes      Yes                                      +---------+---------------+---------+-----------+----------+-------------------+ PTV      Full                                                             +---------+---------------+---------+-----------+----------+-------------------+ PERO                                                  Not well visualized +---------+---------------+---------+-----------+----------+-------------------+    Summary: RIGHT: - No evidence of common femoral vein obstruction.  LEFT: - There is no evidence of deep vein thrombosis in the lower extremity. However, portions  of this examination were limited- see technologist comments above.  - No cystic structure found in the popliteal fossa.  *See table(s) above for measurements and observations. Electronically signed by Sherald Hess MD on 11/03/2022 at 4:40:29 PM.    Final    DG Chest Portable 1 View Result Date: 11/03/2022 CLINICAL DATA:  weakness, swelling EXAM: PORTABLE CHEST - 1 VIEW COMPARISON:  04/06/2014 FINDINGS: Heart is mildly enlarged.  No pulmonary vascular congestion. Lungs are clear. IMPRESSION: Mild cardiomegaly. Electronically Signed   By: Acquanetta Belling M.D.   On: 11/03/2022 15:59   DG Knee 2 Views Left Result Date: 11/03/2022 CLINICAL DATA:  Increased leg swelling. Stood up at physician's appointment and leg gave out. EXAM: LEFT KNEE - 1-2 VIEW COMPARISON:  None available FINDINGS: No fracture or dislocation. Soft tissues are unremarkable. Moderate tricompartmental osteoarthrosis. IMPRESSION: No acute abnormality of the left knee. Electronically Signed   By: Acquanetta Belling M.D.   On: 11/03/2022 15:58    Assessment & Plan:   Problem List Items Addressed This Visit     Knee pain - Primary  Relevant Orders   DME Wheelchair manual   Gait disorder   Alzheimer disease (HCC)   Relevant Orders   DME Wheelchair manual      Meds ordered this encounter  Medications   bumetanide (BUMEX) 0.5 MG tablet    Sig: TAKE 1 TABLET BY MOUTH DAILY WITH BREAKFAST    Dispense:  90 tablet    Refill:  3    Fill all meds today please - the pt is leaving town   cefUROXime (CEFTIN) 250 MG tablet    Sig: Take 1 tablet (250 mg total) by mouth 2 (two) times daily with a meal. Use prn urinary infection    Dispense:  10 tablet    Refill:  1    Fill all meds today please - the pt is leaving town   potassium chloride SA (KLOR-CON M) 20 MEQ tablet    Sig: Take 2 tablets (40 mEq total) by mouth daily.    Dispense:  90 tablet    Refill:  3    Fill all meds today please - the pt is leaving town   torsemide (DEMADEX)  20 MG tablet    Sig: Take 1 tablet (20 mg total) by mouth daily.    Dispense:  90 tablet    Refill:  3    Fill all meds today please - the pt is leaving town   simvastatin (ZOCOR) 5 MG tablet    Sig: Take 1 tablet (5 mg total) by mouth at bedtime. =    Dispense:  90 tablet    Refill:  3    Fill all meds today please - the pt is leaving town   fluconazole (DIFLUCAN) 100 MG tablet    Sig: Take 2 tabs on day#1, then 1 tab daily on Days #2-10    Dispense:  11 tablet    Refill:  1    Fill all meds today please - the pt is leaving town      Follow-up: No follow-ups on file.  Sonda Primes, MD

## 2023-06-19 ENCOUNTER — Other Ambulatory Visit: Payer: Self-pay | Admitting: Internal Medicine

## 2023-06-19 NOTE — Telephone Encounter (Signed)
 Copied from CRM [id number]. Topic: Clinical - Medication Refill >> Jun 19, 2023  4:20 PM Isabell A wrote: Most Recent Primary Care Visit:  Provider: GARALD KARLYNN GAILS  Department: [facility]  Visit Type: OFFICE VISIT  Date: 05/23/2023  Medication: donepezil  (ARICEPT ) 5 MG tablet  bumetanide  (BUMEX ) 0.5 MG tablet   Has the patient contacted their pharmacy? Yes (Agent: If no, request that the patient contact the pharmacy for the refill. If patient does not wish to contact the pharmacy document the reason why and proceed with request.) (Agent: If yes, when and what did the pharmacy advise?)  Is this the correct pharmacy for this prescription? Yes If no, delete pharmacy and type the correct one.  This is the patient's preferred pharmacy:  [facility]  [address] [place], [place], [place] [address] Phone [phone]   Has the prescription been filled recently? Yes  Is the patient out of the medication? No  Has the patient been seen for an appointment in the last year OR does the patient have an upcoming appointment? Yes  Can we respond through MyChart? No  Agent: Please be advised that Rx refills may take up to 3 business days. We ask that you follow-up with your pharmacy.      Son states patient is currently staying in [place]  with her daughter, requesting

## 2023-06-20 MED ORDER — BUMETANIDE 0.5 MG PO TABS: ORAL_TABLET | ORAL | 3 refills | Status: AC

## 2023-06-20 MED ORDER — DONEPEZIL HCL 5 MG PO TABS: ORAL_TABLET | ORAL | 3 refills | Status: AC

## 2023-06-26 ENCOUNTER — Encounter: Payer: Self-pay | Admitting: Internal Medicine

## 2023-06-26 NOTE — Telephone Encounter (Signed)
 error

## 2023-06-26 NOTE — Telephone Encounter (Deleted)
Copied from CRM [id number]. Topic: Clinical - Medication Question >> Jun 26, 2023  8:04 AM Elizebeth Brooking wrote: Reason for CRM: Patient son called in about the status of the patients [facility] [place] vdonepezil (ARICEPT) 5 MG tablet  bumetanide (BUMEX) 0.5 MG tablet is requesting a callback about status of the [facility] getting [facility] [place]

## 2023-06-27 ENCOUNTER — Other Ambulatory Visit: Payer: Self-pay | Admitting: Internal Medicine

## 2023-06-27 NOTE — Telephone Encounter (Signed)
Copied from CRM [id number]. Topic: Clinical - Medication Refill >> Jun 27, 2023  8:28 AM Larwance Sachs wrote: Most Recent Primary Care Visit:  Provider: Tresa Garter  Department: [facility]  Visit Type: OFFICE VISIT  Date: 05/23/2023  Medication: donepezil (ARICEPT) 5 MG tablet bumetanide (BUMEX) 0.5 MG tablet   Has the patient contacted their pharmacy? No, first time filling at pharmacy  (Agent: If no, request that the patient contact the pharmacy for the refill. If patient does not wish to contact the pharmacy document the reason why and proceed with request.) (Agent: If yes, when and what did the pharmacy advise?)  Is this the correct pharmacy for this prescription? Yes If no, delete pharmacy and type the correct one.  This is the patient's preferred pharmacy:  [id number] [place], [place] - [address] AT [facility] [address] [place] [address] Phone: [phone] Fax: [phone]  [id number] [place], [place] - [address] AT [facility] [address] [place] [address] Phone: [phone] Fax: [phone]   Has the prescription been filled recently? No  Is the patient out of the medication? No  Has the patient been seen for an appointment in the last year OR does the patient have an upcoming appointment? Yes  Can we respond through MyChart? No  Agent: Please be advised that Rx refills may take up to 3 business days. We ask that you follow-up with your pharmacy.

## 2024-02-07 ENCOUNTER — Telehealth: Payer: Self-pay | Admitting: Internal Medicine

## 2024-02-07 NOTE — Telephone Encounter (Signed)
 Contacted [name] to schedule their annual wellness visit. Patient declined to schedule AWV at this time.Patient now lives with her daughter in [place].  [facility] Care Guide [facility] AWV TEAM Direct Dial: [phone]
